# Patient Record
Sex: Male | Born: 1970 | Race: Black or African American | Hispanic: No | Marital: Single | Smoking: Never smoker
Health system: Southern US, Community
[De-identification: ages and names within clinical notes are randomized; demographics above are authoritative.]

## PROBLEM LIST (undated history)

## (undated) DIAGNOSIS — J189 Pneumonia, unspecified organism: Secondary | ICD-10-CM

## (undated) DIAGNOSIS — N189 Chronic kidney disease, unspecified: Secondary | ICD-10-CM

## (undated) DIAGNOSIS — I10 Essential (primary) hypertension: Secondary | ICD-10-CM

## (undated) DIAGNOSIS — I509 Heart failure, unspecified: Secondary | ICD-10-CM

## (undated) DIAGNOSIS — E119 Type 2 diabetes mellitus without complications: Secondary | ICD-10-CM

## (undated) DIAGNOSIS — E785 Hyperlipidemia, unspecified: Secondary | ICD-10-CM

## (undated) DIAGNOSIS — R0600 Dyspnea, unspecified: Secondary | ICD-10-CM

## (undated) DIAGNOSIS — T7840XA Allergy, unspecified, initial encounter: Secondary | ICD-10-CM

## (undated) DIAGNOSIS — D649 Anemia, unspecified: Secondary | ICD-10-CM

## (undated) DIAGNOSIS — H5440 Blindness, one eye, unspecified eye: Secondary | ICD-10-CM

## (undated) HISTORY — DX: Blindness, one eye, unspecified eye: H54.40

## (undated) HISTORY — DX: Allergy, unspecified, initial encounter: T78.40XA

## (undated) HISTORY — PX: FRACTURE SURGERY: SHX138

## (undated) HISTORY — PX: CATARACT EXTRACTION: SUR2

## (undated) HISTORY — PX: MENISCUS REPAIR: SHX5179

## (undated) HISTORY — DX: Chronic kidney disease, unspecified: N18.9

## (undated) HISTORY — DX: Hyperlipidemia, unspecified: E78.5

## (undated) HISTORY — PX: EYE SURGERY: SHX253

---

## 2019-06-19 ENCOUNTER — Emergency Department (HOSPITAL_COMMUNITY): Payer: PRIVATE HEALTH INSURANCE

## 2019-06-19 ENCOUNTER — Other Ambulatory Visit: Payer: Self-pay

## 2019-06-19 ENCOUNTER — Inpatient Hospital Stay (HOSPITAL_COMMUNITY): Payer: PRIVATE HEALTH INSURANCE

## 2019-06-19 ENCOUNTER — Inpatient Hospital Stay (HOSPITAL_COMMUNITY)
Admission: EM | Admit: 2019-06-19 | Discharge: 2019-06-21 | DRG: 291 | Disposition: A | Discharge status: Home / Self Care | Payer: PRIVATE HEALTH INSURANCE | Attending: Internal Medicine | Admitting: Internal Medicine

## 2019-06-19 ENCOUNTER — Encounter (HOSPITAL_COMMUNITY): Payer: Self-pay | Admitting: Emergency Medicine

## 2019-06-19 DIAGNOSIS — I5031 Acute diastolic (congestive) heart failure: Secondary | ICD-10-CM

## 2019-06-19 DIAGNOSIS — Z8249 Family history of ischemic heart disease and other diseases of the circulatory system: Secondary | ICD-10-CM

## 2019-06-19 DIAGNOSIS — Z7984 Long term (current) use of oral hypoglycemic drugs: Secondary | ICD-10-CM

## 2019-06-19 DIAGNOSIS — I5033 Acute on chronic diastolic (congestive) heart failure: Secondary | ICD-10-CM | POA: Diagnosis present

## 2019-06-19 DIAGNOSIS — I509 Heart failure, unspecified: Secondary | ICD-10-CM

## 2019-06-19 DIAGNOSIS — E876 Hypokalemia: Secondary | ICD-10-CM | POA: Diagnosis not present

## 2019-06-19 DIAGNOSIS — E785 Hyperlipidemia, unspecified: Secondary | ICD-10-CM | POA: Diagnosis present

## 2019-06-19 DIAGNOSIS — E1165 Type 2 diabetes mellitus with hyperglycemia: Secondary | ICD-10-CM | POA: Diagnosis present

## 2019-06-19 DIAGNOSIS — M170 Bilateral primary osteoarthritis of knee: Secondary | ICD-10-CM | POA: Diagnosis present

## 2019-06-19 DIAGNOSIS — Z20822 Contact with and (suspected) exposure to covid-19: Secondary | ICD-10-CM | POA: Diagnosis present

## 2019-06-19 DIAGNOSIS — R778 Other specified abnormalities of plasma proteins: Secondary | ICD-10-CM | POA: Diagnosis present

## 2019-06-19 DIAGNOSIS — I10 Essential (primary) hypertension: Secondary | ICD-10-CM | POA: Diagnosis present

## 2019-06-19 DIAGNOSIS — R7989 Other specified abnormal findings of blood chemistry: Secondary | ICD-10-CM | POA: Diagnosis present

## 2019-06-19 DIAGNOSIS — E8809 Other disorders of plasma-protein metabolism, not elsewhere classified: Secondary | ICD-10-CM | POA: Diagnosis present

## 2019-06-19 DIAGNOSIS — I130 Hypertensive heart and chronic kidney disease with heart failure and stage 1 through stage 4 chronic kidney disease, or unspecified chronic kidney disease: Principal | ICD-10-CM | POA: Diagnosis present

## 2019-06-19 DIAGNOSIS — N1831 Chronic kidney disease, stage 3a: Secondary | ICD-10-CM | POA: Diagnosis present

## 2019-06-19 DIAGNOSIS — N179 Acute kidney failure, unspecified: Secondary | ICD-10-CM

## 2019-06-19 DIAGNOSIS — I5032 Chronic diastolic (congestive) heart failure: Secondary | ICD-10-CM

## 2019-06-19 DIAGNOSIS — I160 Hypertensive urgency: Secondary | ICD-10-CM

## 2019-06-19 DIAGNOSIS — E1122 Type 2 diabetes mellitus with diabetic chronic kidney disease: Secondary | ICD-10-CM | POA: Diagnosis present

## 2019-06-19 HISTORY — DX: Type 2 diabetes mellitus without complications: E11.9

## 2019-06-19 LAB — URINALYSIS, ROUTINE W REFLEX MICROSCOPIC
Bacteria, UA: NONE SEEN
Bilirubin Urine: NEGATIVE
Glucose, UA: NEGATIVE mg/dL
Ketones, ur: NEGATIVE mg/dL
Leukocytes,Ua: NEGATIVE
Nitrite: NEGATIVE
Protein, ur: 30 mg/dL — AB
Specific Gravity, Urine: 1.004 — ABNORMAL LOW (ref 1.005–1.030)
pH: 7 (ref 5.0–8.0)

## 2019-06-19 LAB — LIPID PANEL
Cholesterol: 207 mg/dL — ABNORMAL HIGH (ref 0–200)
HDL: 38 mg/dL — ABNORMAL LOW (ref >40)
LDL Cholesterol: 154 mg/dL — ABNORMAL HIGH (ref 0–99)
Total CHOL/HDL Ratio: 5.4 RATIO
Triglycerides: 76 mg/dL (ref <150)
VLDL: 15 mg/dL (ref 0–40)

## 2019-06-19 LAB — BASIC METABOLIC PANEL
Anion gap: 6 (ref 5–15)
BUN: 10 mg/dL (ref 6–20)
CO2: 26 mmol/L (ref 22–32)
Calcium: 8.3 mg/dL — ABNORMAL LOW (ref 8.9–10.3)
Chloride: 107 mmol/L (ref 98–111)
Creatinine, Ser: 1.76 mg/dL — ABNORMAL HIGH (ref 0.61–1.24)
GFR calc Af Amer: 52 mL/min — ABNORMAL LOW (ref >60)
GFR calc non Af Amer: 45 mL/min — ABNORMAL LOW (ref >60)
Glucose, Bld: 164 mg/dL — ABNORMAL HIGH (ref 70–99)
Potassium: 4 mmol/L (ref 3.5–5.1)
Sodium: 139 mmol/L (ref 135–145)

## 2019-06-19 LAB — CBC
HCT: 34.1 % — ABNORMAL LOW (ref 39.0–52.0)
Hemoglobin: 11.4 g/dL — ABNORMAL LOW (ref 13.0–17.0)
MCH: 26.8 pg (ref 26.0–34.0)
MCHC: 33.4 g/dL (ref 30.0–36.0)
MCV: 80 fL (ref 80.0–100.0)
Platelets: 277 10*3/uL (ref 150–400)
RBC: 4.26 MIL/uL (ref 4.22–5.81)
RDW: 14.1 % (ref 11.5–15.5)
WBC: 4.8 10*3/uL (ref 4.0–10.5)
nRBC: 0 % (ref 0.0–0.2)

## 2019-06-19 LAB — TROPONIN I (HIGH SENSITIVITY)
Troponin I (High Sensitivity): 38 ng/L — ABNORMAL HIGH (ref <18)
Troponin I (High Sensitivity): 39 ng/L — ABNORMAL HIGH (ref <18)
Troponin I (High Sensitivity): 36 ng/L — ABNORMAL HIGH (ref <18)

## 2019-06-19 LAB — HEPATIC FUNCTION PANEL
ALT: 18 U/L (ref 0–44)
AST: 18 U/L (ref 15–41)
Albumin: 2.6 g/dL — ABNORMAL LOW (ref 3.5–5.0)
Alkaline Phosphatase: 59 U/L (ref 38–126)
Bilirubin, Direct: 0.1 mg/dL (ref 0.0–0.2)
Indirect Bilirubin: 0.9 mg/dL (ref 0.3–0.9)
Total Bilirubin: 1 mg/dL (ref 0.3–1.2)
Total Protein: 6.5 g/dL (ref 6.5–8.1)

## 2019-06-19 LAB — HIV ANTIBODY (ROUTINE TESTING W REFLEX): HIV Screen 4th Generation wRfx: NONREACTIVE

## 2019-06-19 LAB — GLUCOSE, CAPILLARY: Glucose-Capillary: 161 mg/dL — ABNORMAL HIGH (ref 70–99)

## 2019-06-19 LAB — ECHOCARDIOGRAM COMPLETE

## 2019-06-19 LAB — SARS CORONAVIRUS 2 BY RT PCR (HOSPITAL ORDER, PERFORMED IN ~~LOC~~ HOSPITAL LAB): SARS Coronavirus 2: NEGATIVE

## 2019-06-19 LAB — BRAIN NATRIURETIC PEPTIDE: B Natriuretic Peptide: 367.3 pg/mL — ABNORMAL HIGH (ref 0.0–100.0)

## 2019-06-19 LAB — CBG MONITORING, ED: Glucose-Capillary: 120 mg/dL — ABNORMAL HIGH (ref 70–99)

## 2019-06-19 LAB — TSH: TSH: 0.947 u[IU]/mL (ref 0.350–4.500)

## 2019-06-19 LAB — HEMOGLOBIN A1C
Hgb A1c MFr Bld: 9.9 % — ABNORMAL HIGH (ref 4.8–5.6)
Mean Plasma Glucose: 237.43 mg/dL

## 2019-06-19 MED ORDER — FUROSEMIDE 10 MG/ML IJ SOLN
20.0000 mg | INTRAMUSCULAR | Status: AC
  Administered 2019-06-19: 20 mg via INTRAVENOUS
  Filled 2019-06-19: qty 2

## 2019-06-19 MED ORDER — HYDRALAZINE HCL 10 MG PO TABS
10.0000 mg | ORAL_TABLET | Freq: Three times a day (TID) | ORAL | Status: DC
  Administered 2019-06-19 – 2019-06-21 (×6): 10 mg via ORAL
  Filled 2019-06-19 (×8): qty 1

## 2019-06-19 MED ORDER — ONDANSETRON HCL 4 MG/2ML IJ SOLN
4.0000 mg | Freq: Four times a day (QID) | INTRAMUSCULAR | Status: DC | PRN
  Administered 2019-06-19: 4 mg via INTRAVENOUS
  Filled 2019-06-19: qty 2

## 2019-06-19 MED ORDER — ACETAMINOPHEN 325 MG PO TABS
650.0000 mg | ORAL_TABLET | Freq: Four times a day (QID) | ORAL | Status: DC | PRN
  Administered 2019-06-19 – 2019-06-20 (×2): 650 mg via ORAL
  Filled 2019-06-19 (×2): qty 2

## 2019-06-19 MED ORDER — HEPARIN SODIUM (PORCINE) 5000 UNIT/ML IJ SOLN
5000.0000 [IU] | Freq: Three times a day (TID) | INTRAMUSCULAR | Status: DC
  Administered 2019-06-19 – 2019-06-21 (×4): 5000 [IU] via SUBCUTANEOUS
  Filled 2019-06-19 (×6): qty 1

## 2019-06-19 MED ORDER — FUROSEMIDE 10 MG/ML IJ SOLN
40.0000 mg | Freq: Every day | INTRAMUSCULAR | Status: DC
  Administered 2019-06-20 – 2019-06-21 (×2): 40 mg via INTRAVENOUS
  Filled 2019-06-19 (×2): qty 4

## 2019-06-19 MED ORDER — SODIUM CHLORIDE 0.9% FLUSH: 3.0000 mL | Freq: Once | INTRAVENOUS | Status: DC

## 2019-06-19 MED ORDER — ISOSORBIDE MONONITRATE ER 30 MG PO TB24
15.0000 mg | ORAL_TABLET | Freq: Every day | ORAL | Status: DC
  Administered 2019-06-19 – 2019-06-21 (×3): 15 mg via ORAL
  Filled 2019-06-19 (×3): qty 1

## 2019-06-19 MED ORDER — ASPIRIN EC 81 MG PO TBEC
81.0000 mg | DELAYED_RELEASE_TABLET | Freq: Every day | ORAL | Status: DC
  Administered 2019-06-20 – 2019-06-21 (×2): 81 mg via ORAL
  Filled 2019-06-19 (×3): qty 1

## 2019-06-19 MED ORDER — CLONIDINE HCL 0.2 MG PO TABS
0.2000 mg | ORAL_TABLET | Freq: Once | ORAL | Status: AC
  Administered 2019-06-19: 0.2 mg via ORAL
  Filled 2019-06-19: qty 1

## 2019-06-19 MED ORDER — FUROSEMIDE 10 MG/ML IJ SOLN: 40.0000 mg | Freq: Every day | INTRAMUSCULAR | Status: DC

## 2019-06-19 MED ORDER — PROCHLORPERAZINE EDISYLATE 10 MG/2ML IJ SOLN
10.0000 mg | Freq: Once | INTRAMUSCULAR | Status: AC
  Administered 2019-06-19: 10 mg via INTRAVENOUS
  Filled 2019-06-19: qty 2

## 2019-06-19 MED ORDER — ATORVASTATIN CALCIUM 40 MG PO TABS
40.0000 mg | ORAL_TABLET | Freq: Every day | ORAL | Status: DC
  Administered 2019-06-19 – 2019-06-21 (×3): 40 mg via ORAL
  Filled 2019-06-19 (×3): qty 1

## 2019-06-19 MED ORDER — INSULIN ASPART 100 UNIT/ML ~~LOC~~ SOLN
0.0000 [IU] | Freq: Three times a day (TID) | SUBCUTANEOUS | Status: DC
  Administered 2019-06-20 (×2): 3 [IU] via SUBCUTANEOUS
  Administered 2019-06-20: 2 [IU] via SUBCUTANEOUS
  Administered 2019-06-21: 3 [IU] via SUBCUTANEOUS

## 2019-06-19 MED ORDER — INSULIN ASPART 100 UNIT/ML ~~LOC~~ SOLN: 0.0000 [IU] | Freq: Every day | SUBCUTANEOUS | Status: DC

## 2019-06-19 NOTE — Progress Notes (Signed)
   06/19/19 1950  Assess: MEWS Score  Temp 99.1 F (37.3 C)  BP (!) 202/117  Pulse Rate 77  Resp 14  SpO2 94 %  O2 Device Room Air  Assess: MEWS Score  MEWS Temp 0  MEWS Systolic 2  MEWS Pulse 0  MEWS RR 0  MEWS LOC 0  MEWS Score 2  MEWS Score Color Yellow  Assess: if the MEWS score is Yellow or Red  Were vital signs taken at a resting state? Yes  Focused Assessment Documented focused assessment  Early Detection of Sepsis Score *See Row Information* Low  MEWS guidelines implemented *See Row Information* Yes  Treat  MEWS Interventions Other (Comment) (notified APP)  Take Vital Signs  Increase Vital Sign Frequency  Yellow: Q 2hr X 2 then Q 4hr X 2, if remains yellow, continue Q 4hrs  Escalate  MEWS: Escalate Yellow: discuss with charge nurse/RN and consider discussing with provider and RRT  Notify: Charge Nurse/RN  Name of Charge Nurse/RN Notified Lars Mage, RN  Date Charge Nurse/RN Notified 06/19/19  Time Charge Nurse/RN Notified 1954  Notify: Provider  Provider Name/Title Kennon Holter, AOO  Date Provider Notified 06/19/19  Time Provider Notified 1954  Notification Type Page  Notification Reason Change in status  Response See new orders  Date of Provider Response 06/19/19  Time of Provider Response 2006  Notify: Rapid Response  Name of Rapid Response RN Notified  (n/a)  Document  Patient Outcome Stabilized after interventions  Progress note created (see row info) Yes   MEWS yellow due to elevated BP.  Yellow protocol initiated.  APP notified.  Requested manual BP and placed order for clonidine. Pt repeat BP 166/89.

## 2019-06-19 NOTE — Progress Notes (Signed)
  Echocardiogram 2D Echocardiogram has been performed.  Merrie Roof F 06/19/2019, 2:33 PM

## 2019-06-19 NOTE — ED Provider Notes (Signed)
[facility] EMERGENCY DEPARTMENT Provider Note   CSN: [id number] Arrival date & time: 06/19/19  7026     History Chief Complaint  Patient presents with  . Shortness of Breath    Timothy [name] is a 49 y.o. male.  49 year old male with past medical history including T2DM who p/w as of breath and leg swelling.  Patient reports a 3-day history of progressively worsening shortness of breath which he notes is present at night when he tries to sleep as well as when he exerts himself.  He notes that he has not been able to lay flat at night and has to sleep sitting up due to the severity of his shortness of breath.  He has also noticed shortness of breath with exertion with associated chest tightness.  He has a mild dry cough but no other URI symptoms.  He is also noticed lower extremity edema and increased abdominal girth.  He denies any history of heart failure or other cardiac problems.  Family history notable for mother with heart problems.  He denies tobacco, alcohol, or drug use. No recent long travel, h/o blood clots, or h/o cancer.  The history is provided by the patient.  Shortness of Breath      Past Medical History:  Diagnosis Date  . Diabetes mellitus without complication ([place])     There are no problems to display for this patient.   History reviewed. No pertinent surgical history.     No family history on file.  Social History   Tobacco Use  . Smoking status: Not on file  Substance Use Topics  . Alcohol use: Not on file  . Drug use: Not on file    Home Medications Prior to Admission medications   Not on File    Allergies    Patient has no known allergies.  Review of Systems   Review of Systems  Respiratory: Positive for shortness of breath.    All other systems reviewed and are negative except that which was mentioned in HPI  Physical Exam Updated Vital Signs BP (!) 204/119   Pulse 72   Temp 98.4 F (36.9 C) (Oral)    Resp 17   SpO2 95%   Physical Exam Vitals and nursing note reviewed.  Constitutional:      General: He is not in acute distress.    Appearance: He is well-developed.  HENT:     Head: Normocephalic and atraumatic.  Eyes:     Conjunctiva/sclera: Conjunctivae normal.  Cardiovascular:     Rate and Rhythm: Normal rate and regular rhythm.     Heart sounds: Murmur present.  Pulmonary:     Comments: Mild tachypnea without respiratory distress; crackles b/l in all lung fields Abdominal:     General: Bowel sounds are normal. There is no distension.     Palpations: Abdomen is soft.     Tenderness: There is no abdominal tenderness.  Musculoskeletal:     Cervical back: Neck supple.     Right lower leg: Edema present.     Left lower leg: Edema present.     Comments: 2+ pitting edema BLE  Skin:    General: Skin is warm and dry.  Neurological:     Mental Status: He is alert and oriented to person, place, and time.     Comments: Fluent speech  Psychiatric:        Judgment: Judgment normal.     ED Results / Procedures / Treatments  Labs (all labs ordered are listed, but only abnormal results are displayed) Labs Reviewed  BASIC METABOLIC PANEL - Abnormal; Notable for the following components:      Result Value   Glucose, Bld 164 (*)    Creatinine, Ser 1.76 (*)    Calcium 8.3 (*)    GFR calc non Af Amer 45 (*)    GFR calc Af Amer 52 (*)    All other components within normal limits  CBC - Abnormal; Notable for the following components:   Hemoglobin 11.4 (*)    HCT 34.1 (*)    All other components within normal limits  BRAIN NATRIURETIC PEPTIDE - Abnormal; Notable for the following components:   B Natriuretic Peptide 367.3 (*)    All other components within normal limits  HEPATIC FUNCTION PANEL - Abnormal; Notable for the following components:   Albumin 2.6 (*)    All other components within normal limits  URINALYSIS, ROUTINE W REFLEX MICROSCOPIC - Abnormal; Notable for the  following components:   Color, Urine COLORLESS (*)    Specific Gravity, Urine 1.004 (*)    Hgb urine dipstick SMALL (*)    Protein, ur 30 (*)    All other components within normal limits  HEMOGLOBIN A1C - Abnormal; Notable for the following components:   Hgb A1c MFr Bld 9.9 (*)    All other components within normal limits  LIPID PANEL - Abnormal; Notable for the following components:   Cholesterol 207 (*)    HDL 38 (*)    LDL Cholesterol 154 (*)    All other components within normal limits  TROPONIN I (HIGH SENSITIVITY) - Abnormal; Notable for the following components:   Troponin I (High Sensitivity) 38 (*)    All other components within normal limits  TROPONIN I (HIGH SENSITIVITY) - Abnormal; Notable for the following components:   Troponin I (High Sensitivity) 39 (*)    All other components within normal limits  SARS CORONAVIRUS 2 BY RT PCR (HOSPITAL ORDER, [place] [facility])  TSH  HIV ANTIBODY (ROUTINE TESTING W REFLEX)    EKG EKG Interpretation  Date/Time:  Saturday Jun 19 2019 09:08:52 EDT Ventricular Rate:  92 PR Interval:  188 QRS Duration: 88 QT Interval:  410 QTC Calculation: 507 R Axis:   103 Text Interpretation: Sinus rhythm with occasional Premature ventricular complexes Rightward axis Septal infarct , age undetermined Prolonged QT Abnormal ECG No previous ECGs available Confirmed by Theotis Burrow [phone]) on 06/19/2019 9:51:12 AM   Radiology DG Chest 2 View  Result Date: 06/19/2019 CLINICAL DATA:  Shortness of breath for 2 days with dry cough. EXAM: CHEST - 2 VIEW COMPARISON:  None. FINDINGS: Heart size is within normal limits. There is patchy bilateral perihilar and bibasilar airspace opacities. Upper lungs are relatively clear. No pleural effusion or pneumothorax is seen. Osseous structures about the chest are unremarkable. IMPRESSION: Patchy bilateral perihilar and bibasilar airspace opacities. Differential includes multifocal pneumonia,  asymmetric edema related to volume overload, hypersensitivity pneumonitis, and respiratory bronchiolitis. Electronically Signed   By: Franki Cabot M.D.   On: 06/19/2019 11:02   ECHOCARDIOGRAM COMPLETE  Result Date: 06/19/2019    ECHOCARDIOGRAM REPORT   Patient Name:   Timothy [name] Date of Exam: 06/19/2019 Medical Rec #:  [id number]             Height:       66.0 in Accession #:    [id number]  Weight:       180.0 lb Date of Birth:  05/15/1970             BSA:          1.913 m Patient Age:    16 years              BP:           188/108 mmHg Patient Gender: M                     HR:           71 bpm. Exam Location:  Inpatient Procedure: 2D Echo, Cardiac Doppler and Color Doppler Indications:    H37.16 Acute diastolic (congestive) heart failure  History:        Patient has no prior history of Echocardiogram examinations.                 Risk Factors:Hypertension.  Sonographer:    Merrie Roof RDCS Referring Phys: [id number] [place]  1. Left ventricular ejection fraction, by estimation, is 60 to 65%. The left ventricle has normal function. The left ventricle has no regional wall motion abnormalities. There is severe left ventricular hypertrophy. Elevated EDP by E/E' velocity.  2. Right ventricular systolic function is normal. The right ventricular size is normal.  3. The mitral valve is normal in structure. Mild mitral valve regurgitation. No evidence of mitral stenosis.  4. The aortic valve is tricuspid. Aortic valve regurgitation is mild. No aortic stenosis is present.  5. The inferior vena cava is normal in size with greater than 50% respiratory variability, suggesting right atrial pressure of 3 mmHg. FINDINGS  Left Ventricle: Left ventricular ejection fraction, by estimation, is 60 to 65%. The left ventricle has normal function. The left ventricle has no regional wall motion abnormalities. The left ventricular internal cavity size was normal in size. There is  severe left  ventricular hypertrophy. Elevated EDP by E/E' velocity. Right Ventricle: The right ventricular size is normal. No increase in right ventricular wall thickness. Right ventricular systolic function is normal. Left Atrium: Left atrial size was normal in size. Right Atrium: Right atrial size was normal in size. Pericardium: Trivial pericardial effusion is present. The pericardial effusion is posterior to the left ventricle and localized near the right atrium. Mitral Valve: The mitral valve is normal in structure. Normal mobility of the mitral valve leaflets. Mild mitral valve regurgitation. No evidence of mitral valve stenosis. Tricuspid Valve: The tricuspid valve is normal in structure. Tricuspid valve regurgitation is mild . No evidence of tricuspid stenosis. Aortic Valve: The aortic valve is tricuspid. Aortic valve regurgitation is mild. No aortic stenosis is present. Pulmonic Valve: The pulmonic valve was normal in structure. Pulmonic valve regurgitation is mild. No evidence of pulmonic stenosis. Aorta: The aortic root is normal in size and structure. Venous: The inferior vena cava is normal in size with greater than 50% respiratory variability, suggesting right atrial pressure of 3 mmHg. IAS/Shunts: No atrial level shunt detected by color flow Doppler.   LV Volumes (MOD) LV vol d, MOD A4C: 90.4 ml Diastology LV vol s, MOD A4C: 36.5 ml LV e' lateral:   7.07 cm/s LV SV MOD A4C:     90.4 ml LV E/e' lateral: 13.5                            LV e' medial:    6.08 cm/s  LV E/e' medial:  15.6  RIGHT VENTRICLE             IVC RV Basal diam:  3.80 cm     IVC diam: 1.80 cm RV S prime:     18.00 cm/s TAPSE (M-mode): 2.7 cm LEFT ATRIUM            Index LA Vol (A4C): 120.0 ml 62.74 ml/m  AORTIC VALVE LVOT Vmax:   89.40 cm/s LVOT Vmean:  60.100 cm/s LVOT VTI:    0.181 m  AORTA Ao Asc diam: 3.30 cm MITRAL VALVE MV Area (PHT): 4.49 cm    SHUNTS MV Decel Time: 169 msec    Systemic VTI: 0.18 m MV E  velocity: 95.10 cm/s MV A velocity: 42.00 cm/s MV E/A ratio:  2.26 Jenkins Rouge MD Electronically signed by Jenkins Rouge MD Signature Date/Time: 06/19/2019/3:29:11 PM    Final     Procedures Procedures (including critical care time)  Medications Ordered in ED Medications  sodium chloride flush (NS) 0.9 % injection 3 mL (has no administration in time range)  furosemide (LASIX) injection 20 mg (20 mg Intravenous Given 06/19/19 1037)    ED Course  I have reviewed the triage vital signs and the nursing notes.  Pertinent labs & imaging results that were available during my care of the patient were reviewed by me and considered in my medical decision making (see chart for details).    MDM Rules/Calculators/A&P                      Pt hypertensive on exam, O2 sat stable on RA.  He had mild tachypnea and crackles bilaterally as well as pitting edema of lower extremities.  Concern for heart failure based on symptoms.  Differential includes renal failure or liver disease although less likely given acute nature of sx. Labs show Cr 1.76, Hgb 11.4, BNP 367, trop 38 likely 2/2 kidney injury. CXR showing b/l infiltrates, suspect CHF w/ pulm edema based on hx and exam. Gave IV lasix. Discussed admission w/ [facility], Dr. Vallery Ridge Final Clinical Impression(s) / ED Diagnoses Final diagnoses:  Congestive heart failure, unspecified HF chronicity, unspecified heart failure type ([place])  AKI (acute kidney injury) [facility])    Rx / DC Orders ED Discharge Orders    None       Matthews Franks, Wenda Overland, MD 06/19/19 1542

## 2019-06-19 NOTE — ED Triage Notes (Signed)
Pt reports sob and leg swelling x2 days, denies cardiac history. resp labored.

## 2019-06-19 NOTE — H&P (Addendum)
History and Physical    Timothy [name] HYQ:[id number] DOB: 1970-10-12 DOA: 06/19/2019  PCP: Patient, No Pcp Per Raelyn Number N Patient coming from: Home.  I have personally briefly reviewed patient's old medical records in [place]  Chief Complaint: Shortness of breath for 3 days.  HPI: Timothy [name] is a 49 y.o. male with HTN, DM type [name], evisceration left eye post trauma, bilateral OA of knees presented to the ED with above.   He reports 1 week of progressively worsening dyspnea with wheezing and dry cough.  Also reports 3 pillow orthopnea/PND. For the past 2 days he has developed bilateral lower extremity swelling which got him concerned and prompted him to come to the ED.  He denies any chest pain, palpitations. No fever chills or cough. No blood in the stools.  ED Course:  In the ED vital signs noted for blood pressures 199/116-204/119, pulse 73, RR 16 SPO2 98% room air. Labs with WBC 4.8, BUN 10, creatinine 1.7, albumin 2.6. hs troponin 38, BNP 367.3  CXR with CHF vs multifocal PNA.  EKG with NSR at 70 bpm, right axis deviation.  LVH with ST-T wave changes.  Question repol abnormalities.  QTc 450 ms.  He received 20 mg of IV Lasix with cumulative urine output of 600 cc.   Personal social history: Originally from [place] but now lives here with girlfriend.  Has 1 daughter that resides in [place].  Denies any history of smoking EtOH use or any drugs. Loves sports including Basketball.   Family history: Significant for mother dying of CAD/MI in 75 at age 20. Father passed away of unknown cause in his 37s.  Review of Systems: As per HPI otherwise 10 point review of systems negative.  Review of Systems  Constitutional: Positive for malaise/fatigue. Negative for chills, diaphoresis, fever and weight loss.  HENT: Negative.   Eyes: Negative for blurred vision, double vision, photophobia, pain, discharge and redness.  Respiratory: Positive for cough,  shortness of breath and wheezing. Negative for hemoptysis and sputum production.   Cardiovascular: Positive for orthopnea, leg swelling and PND. Negative for chest pain, palpitations and claudication.  Gastrointestinal: Negative for abdominal pain, blood in stool, constipation, diarrhea, heartburn, melena, nausea and vomiting.  Genitourinary: Negative.   Musculoskeletal: Negative.   Skin: Negative.   Neurological: Negative.   Endo/Heme/Allergies: Negative.   Psychiatric/Behavioral: Negative.     Past Medical History:  Diagnosis Date  . Diabetes mellitus without complication ([place])     History reviewed. No pertinent surgical history.   has no history on file for tobacco, alcohol, and drug.  No Known Allergies  No family history on file.  Unacceptable: Noncontributory, unremarkable, or negative. Acceptable: (example)Family history negative for heart disease  Prior to Admission medications   Not on File    Physical Exam: Vitals:   06/19/19 0954 06/19/19 1000 06/19/19 1054 06/19/19 1055  BP: (!) 199/116 (!) 196/115 (!) 204/119 (!) 204/119  Pulse: 73 72 72 72  Resp: 16  13 17   Temp: 98 F (36.7 C)  98.4 F (36.9 C)   TempSrc: Oral  Oral   SpO2: 98% 98% 96% 95%    Constitutional: NAD, calm, comfortable Vitals:   06/19/19 0954 06/19/19 1000 06/19/19 1054 06/19/19 1055  BP: (!) 199/116 (!) 196/115 (!) 204/119 (!) 204/119  Pulse: 73 72 72 72  Resp: 16  13 17   Temp: 98 F (36.7 C)  98.4 F (36.9 C)   TempSrc: Oral  Oral   SpO2: 98% 98% 96% 95%    Physical Exam  Constitutional: He is oriented to person, place, and time. He appears well-developed and well-nourished. No distress.  HENT:  Head: Normocephalic and atraumatic.  Right Ear: External ear normal.  Left Ear: External ear normal.  Eyes: Pupils are equal, round, and reactive to light. Right eye exhibits no discharge.  Status post evisceration of left eye.  Neck: No JVD present.  No carotid bruits.    Cardiovascular: Normal rate and regular rhythm. Exam reveals no gallop and no friction rub.  No murmur heard. Pulmonary/Chest: No respiratory distress. He has wheezes. He has rales.  Abdominal: Bowel sounds are normal. He exhibits no distension and no mass. There is no abdominal tenderness. There is no rebound and no guarding.  Musculoskeletal:        General: Edema present. No tenderness or deformity.     Cervical back: Normal range of motion.  Neurological: He is alert and oriented to person, place, and time. He displays normal reflexes. He exhibits normal muscle tone.  Skin: Skin is warm and dry. No rash noted. He is not diaphoretic. No erythema.  Psychiatric: He has a normal mood and affect. His behavior is normal. Judgment and thought content normal.     Labs on Admission: I have personally reviewed following labs and imaging studies  CBC: Recent Labs  Lab 06/19/19 0920  WBC 4.8  HGB 11.4*  HCT 34.1*  MCV 80.0  PLT 825   Basic Metabolic Panel: Recent Labs  Lab 06/19/19 0920  NA 139  K 4.0  CL 107  CO2 26  GLUCOSE 164*  BUN 10  CREATININE 1.76*  CALCIUM 8.3*   GFR: CrCl cannot be calculated (Unknown ideal weight.). Liver Function Tests: Recent Labs  Lab 06/19/19 0920  AST 18  ALT 18  ALKPHOS 59  BILITOT 1.0  PROT 6.5  ALBUMIN 2.6*   No results for input(s): LIPASE, AMYLASE in the last 168 hours. No results for input(s): AMMONIA in the last 168 hours. Coagulation Profile: No results for input(s): INR, PROTIME in the last 168 hours. Cardiac Enzymes: No results for input(s): CKTOTAL, CKMB, CKMBINDEX, TROPONINI in the last 168 hours. BNP (last 3 results) No results for input(s): PROBNP in the last 8760 hours. HbA1C: No results for input(s): HGBA1C in the last 72 hours. CBG: No results for input(s): GLUCAP in the last 168 hours. Lipid Profile: No results for input(s): CHOL, HDL, LDLCALC, TRIG, CHOLHDL, LDLDIRECT in the last 72 hours. Thyroid Function  Tests: Recent Labs    06/19/19 1450  TSH 0.947   Anemia Panel: No results for input(s): VITAMINB12, FOLATE, FERRITIN, TIBC, IRON, RETICCTPCT in the last 72 hours. Urine analysis: No results found for: COLORURINE, APPEARANCEUR, [place], [place], GLUCOSEU, HGBUR, BILIRUBINUR, KETONESUR, PROTEINUR, UROBILINOGEN, NITRITE, LEUKOCYTESUR  Radiological Exams on Admission: DG Chest 2 View  Result Date: 06/19/2019 CLINICAL DATA:  Shortness of breath for 2 days with dry cough. EXAM: CHEST - 2 VIEW COMPARISON:  None. FINDINGS: Heart size is within normal limits. There is patchy bilateral perihilar and bibasilar airspace opacities. Upper lungs are relatively clear. No pleural effusion or pneumothorax is seen. Osseous structures about the chest are unremarkable. IMPRESSION: Patchy bilateral perihilar and bibasilar airspace opacities. Differential includes multifocal pneumonia, asymmetric edema related to volume overload, hypersensitivity pneumonitis, and respiratory bronchiolitis. Electronically Signed   By: Franki Cabot M.D.   On: 06/19/2019 11:02    EKG: Independently reviewed.  Normal sinus rhythm at 92  bpm, normal axis LVH with ST-T wave changes.  Question repull.  QTc 507 ms  Assessment/Plan Active Problems:   Elevated troponin   Acute CHF (congestive heart failure) (HCC)   Hypoalbuminemia   Hypertensive urgency   AKI (acute kidney injury) (H[facility])   CHF (congestive heart failure) (HCC)  #Acute CHF likely diastolic given history of uncontrolled hypertension -TTE. -We will start Lasix 20 mg daily and titrate to keep 1 to 2 L negative daily. -Patient counseled on fluid restriction to 48 ounces daily.  2 g sodium. -To start on beta-blockers once in compensated CHF. -Not a candidate for ACE/ARB due to presumed AKI. -Continue with aspirin. -Will need outpatient cardiology/PCP follow-up.  #HTN urgency  -Start on Imdur/hydralazine combination. -No HCTZ given patient would likely be on Lasix as  outpatient. -No ACE ARB currently due to presumed AKI. -No beta-blockers as decompensated CHF.  #DM type [name] -Poorly controlled as indicated by HbA1c of 9.9 percent -Holding Metformin secondary to AKI. -He will likely need insulin versus multiple oral hypoglycemic meds. -Endocrine consult.  Will also need diabetic teaching. -Lipitor given LDL of 154.  #AKI crt 1.7 unknown baseline.  -Mild proteinuria on UA.  We will continue to monitor on diuresis.  # Hypalbuminemia 2.4 -HIV nonreactive. UA pending.    DVT prophylaxis: Subcu heparin.    Code Status: Full code Family Communication: Spoke with patient.  Disposition Plan: Likely home.    Consults called:  Admission status: Inpatient   Jazmon Kos MD [facility]   If 7PM-7AM, please contact night-coverage [email or web address] Password [facility]  06/19/2019, 12:27 PM

## 2019-06-20 DIAGNOSIS — I10 Essential (primary) hypertension: Secondary | ICD-10-CM

## 2019-06-20 LAB — CBC WITH DIFFERENTIAL/PLATELET
Abs Immature Granulocytes: 0.02 10*3/uL (ref 0.00–0.07)
Basophils Absolute: 0 10*3/uL (ref 0.0–0.1)
Basophils Relative: 1 %
Eosinophils Absolute: 0.2 10*3/uL (ref 0.0–0.5)
Eosinophils Relative: 4 %
HCT: 32.1 % — ABNORMAL LOW (ref 39.0–52.0)
Hemoglobin: 10.8 g/dL — ABNORMAL LOW (ref 13.0–17.0)
Immature Granulocytes: 0 %
Lymphocytes Relative: 23 %
Lymphs Abs: 1.1 10*3/uL (ref 0.7–4.0)
MCH: 26.9 pg (ref 26.0–34.0)
MCHC: 33.6 g/dL (ref 30.0–36.0)
MCV: 80 fL (ref 80.0–100.0)
Monocytes Absolute: 0.4 10*3/uL (ref 0.1–1.0)
Monocytes Relative: 9 %
Neutro Abs: 3.2 10*3/uL (ref 1.7–7.7)
Neutrophils Relative %: 63 %
Platelets: 278 10*3/uL (ref 150–400)
RBC: 4.01 MIL/uL — ABNORMAL LOW (ref 4.22–5.81)
RDW: 13.8 % (ref 11.5–15.5)
WBC: 5.1 10*3/uL (ref 4.0–10.5)
nRBC: 0 % (ref 0.0–0.2)

## 2019-06-20 LAB — BASIC METABOLIC PANEL
Anion gap: 8 (ref 5–15)
BUN: 12 mg/dL (ref 6–20)
CO2: 28 mmol/L (ref 22–32)
Calcium: 8 mg/dL — ABNORMAL LOW (ref 8.9–10.3)
Chloride: 100 mmol/L (ref 98–111)
Creatinine, Ser: 2.01 mg/dL — ABNORMAL HIGH (ref 0.61–1.24)
GFR calc Af Amer: 44 mL/min — ABNORMAL LOW (ref >60)
GFR calc non Af Amer: 38 mL/min — ABNORMAL LOW (ref >60)
Glucose, Bld: 212 mg/dL — ABNORMAL HIGH (ref 70–99)
Potassium: 3.6 mmol/L (ref 3.5–5.1)
Sodium: 136 mmol/L (ref 135–145)

## 2019-06-20 LAB — TROPONIN I (HIGH SENSITIVITY): Troponin I (High Sensitivity): 36 ng/L — ABNORMAL HIGH (ref <18)

## 2019-06-20 LAB — GLUCOSE, CAPILLARY
Glucose-Capillary: 174 mg/dL — ABNORMAL HIGH (ref 70–99)
Glucose-Capillary: 149 mg/dL — ABNORMAL HIGH (ref 70–99)
Glucose-Capillary: 153 mg/dL — ABNORMAL HIGH (ref 70–99)
Glucose-Capillary: 126 mg/dL — ABNORMAL HIGH (ref 70–99)

## 2019-06-20 LAB — MAGNESIUM: Magnesium: 1.6 mg/dL — ABNORMAL LOW (ref 1.7–2.4)

## 2019-06-20 LAB — BRAIN NATRIURETIC PEPTIDE: B Natriuretic Peptide: 301.2 pg/mL — ABNORMAL HIGH (ref 0.0–100.0)

## 2019-06-20 NOTE — Progress Notes (Signed)
PROGRESS NOTE    Timothy [name]  ZOX:[id number] DOB: 10/24/1970 DOA: 06/19/2019 PCP: Patient, No Pcp Per    Brief Narrative:  49 year old gentleman with history of diabetes, intermittently taking Metformin for a long time, no regular medical follow-up presents to the emergency room with exertional dyspnea ongoing for more than a month, worse for the last 3 days.  Patient also complained of 3 pillow orthopnea and PND.  Also developed lower extremity swelling that prompted him to come to the ER. In the emergency room, initial blood pressure 199/116.  He is on room air.  Creatinine 1.7.  Troponins 38.  proBNP was 367.  Chest x-ray showed bilateral fluid.   Assessment & Plan:   Active Problems:   Elevated troponin   Acute CHF (congestive heart failure) (HCC)   Hypoalbuminemia   Hypertensive urgency   AKI (acute kidney injury) ([facility])   CHF (congestive heart failure) (HCC)   Accelerated hypertension  Acute diastolic congestive heart failure: Due to underlying untreated hypertension.  Clinically improving.  Echocardiogram with normal ejection fraction and severe left ventricular hypertrophy.  Will treat underlying hypertension aggressively. Continue Lasix 40 mg IV daily, will change to oral Lasix on discharge.  Hypertensive urgency: Blood pressures were uncontrolled.  Never treated for high blood pressures in the past.  Patient is started on hydralazine, nitrates.  Blood pressures already improving.  We will continue.  We will further uptitrate while he is in the hospital.  Hyperlipidemia: LDL more than 130.  Started on Lipitor.  We will continue.  Renal function abnormalities: Probably chronic kidney disease stage IIIa.  Minimal proteinuria.  Will need close monitoring outpatient.  Type 2 diabetes: Uncontrolled with hyperglycemia.  A1c 9.9.  On Metformin at home that is also he is taking intermittently.  Currently remains on sliding scale insulin.  Patient is not willing to go home on  insulin at this time.  Will monitor for renal function improvement.  Discharged home on oral hypoglycemics, will add sulfonylurea with Metformin.   DVT prophylaxis: Heparin subcu Code Status: Full code Family Communication: None Disposition Plan: Status is: Inpatient  Remains inpatient appropriate because:IV treatments appropriate due to intensity of illness or inability to take PO   Dispo: The patient is from: Home              Anticipated d/c is to: Home              Anticipated d/c date is: 2 days              Patient currently is not medically stable to d/c.  Patient admitted with significant symptoms including orthopnea due to diastolic dysfunction needing IV diuresis to improve his symptoms.  He will need to stay in the hospital for more than 2 midnights.          Consultants:   None  Procedures:   None  Antimicrobials:   None   Subjective: Patient was seen and examined.  History reviewed with him.  Patient states that he he has felt slightly better his breathing after admission to the hospital.  She was able to sleep well.  No other events.  Patient tells me that he was never diagnosed with hypertension in the past.  Objective: Vitals:   06/19/19 2142 06/20/19 0017 06/20/19 0350 06/20/19 0747  BP: (!) 166/89 (!) 149/91 (!) 141/88 138/80  Pulse: 79 69 65 70  Resp: 16 16 16 16   Temp: 98.4 F (36.9 C) 98.7 F (37.1  C) 97.8 F (36.6 C) 98.2 F (36.8 C)  TempSrc: Oral Oral Oral Oral  SpO2: 94% 95% 96% 97%  Weight:   84.4 kg   Height:        Intake/Output Summary (Last 24 hours) at 06/20/2019 1111 Last data filed at 06/20/2019 1032 Gross per 24 hour  Intake 662 ml  Output 675 ml  Net -13 ml   Filed Weights   06/19/19 1939 06/20/19 0350  Weight: 83.8 kg 84.4 kg    Examination:  General exam: Appears calm and comfortable , now on room air. Respiratory system: Clear to auscultation.  No added sounds. Cardiovascular system: S1 & S2 heard, RRR.  Trace  bilateral pedal edema.  JVD elevated. Gastrointestinal system: Abdomen is nondistended, soft and nontender.  Central nervous system: Alert and oriented. No focal neurological deficits. Extremities: Symmetric 5 x 5 power. Skin: No rashes, lesions or ulcers Psychiatry: Judgement and insight appear normal. Mood & affect appropriate.     Data Reviewed: I have personally reviewed following labs and imaging studies  CBC: Recent Labs  Lab 06/19/19 0920 06/20/19 1036  WBC 4.8 5.1  NEUTROABS  --  3.2  HGB 11.4* 10.8*  HCT 34.1* 32.1*  MCV 80.0 80.0  PLT 277 527   Basic Metabolic Panel: Recent Labs  Lab 06/19/19 0920  NA 139  K 4.0  CL 107  CO2 26  GLUCOSE 164*  BUN 10  CREATININE 1.76*  CALCIUM 8.3*   GFR: Estimated Creatinine Clearance: 52.3 mL/min (A) (by C-G formula based on SCr of 1.76 mg/dL (H)). Liver Function Tests: Recent Labs  Lab 06/19/19 0920  AST 18  ALT 18  ALKPHOS 59  BILITOT 1.0  PROT 6.5  ALBUMIN 2.6*   No results for input(s): LIPASE, AMYLASE in the last 168 hours. No results for input(s): AMMONIA in the last 168 hours. Coagulation Profile: No results for input(s): INR, PROTIME in the last 168 hours. Cardiac Enzymes: No results for input(s): CKTOTAL, CKMB, CKMBINDEX, TROPONINI in the last 168 hours. BNP (last 3 results) No results for input(s): PROBNP in the last 8760 hours. HbA1C: Recent Labs    06/19/19 1450  HGBA1C 9.9*   CBG: Recent Labs  Lab 06/19/19 1710 06/19/19 2142 06/20/19 0627  GLUCAP 120* 161* 174*   Lipid Profile: Recent Labs    06/19/19 1450  CHOL 207*  HDL 38*  LDLCALC 154*  TRIG 76  CHOLHDL 5.4   Thyroid Function Tests: Recent Labs    06/19/19 1450  TSH 0.947   Anemia Panel: No results for input(s): VITAMINB12, FOLATE, FERRITIN, TIBC, IRON, RETICCTPCT in the last 72 hours. Sepsis Labs: No results for input(s): PROCALCITON, LATICACIDVEN in the last 168 hours.  Recent Results (from the past 240 hour(s))    SARS Coronavirus 2 by RT PCR (hospital order, performed in [facility] hospital lab) Nasopharyngeal Nasopharyngeal Swab     Status: None   Collection Time: 06/19/19  1:24 PM   Specimen: Nasopharyngeal Swab  Result Value Ref Range Status   SARS Coronavirus 2 NEGATIVE NEGATIVE Final    Comment: (NOTE) SARS-CoV-2 target nucleic acids are NOT DETECTED. The SARS-CoV-2 RNA is generally detectable in upper and lower respiratory specimens during the acute phase of infection. The lowest concentration of SARS-CoV-2 viral copies this assay can detect is 250 copies / mL. A negative result does not preclude SARS-CoV-2 infection and should not be used as the sole basis for treatment or other patient management decisions.  A negative result may  occur with improper specimen collection / handling, submission of specimen other than nasopharyngeal swab, presence of viral mutation(s) within the areas targeted by this assay, and inadequate number of viral copies (<250 copies / mL). A negative result must be combined with clinical observations, patient history, and epidemiological information. Fact Sheet for Patients:   StrictlyIdeas.no Fact Sheet for Healthcare Providers: BankingDealers.co.za This test is not yet approved or cleared  by the [place] FDA and has been authorized for detection and/or diagnosis of SARS-CoV-2 by FDA under an Emergency Use Authorization (EUA).  This EUA will remain in effect (meaning this test can be used) for the duration of the COVID-19 declaration under Section 564(b)(1) of the Act, 21 U.S.C. section 360bbb-3(b)(1), unless the authorization is terminated or revoked sooner. Performed at [facility], [place] [address], [place], [place] [address]          Radiology Studies: DG Chest 2 View  Result Date: 06/19/2019 CLINICAL DATA:  Shortness of breath for 2 days with dry cough. EXAM: CHEST - 2 VIEW COMPARISON:   None. FINDINGS: Heart size is within normal limits. There is patchy bilateral perihilar and bibasilar airspace opacities. Upper lungs are relatively clear. No pleural effusion or pneumothorax is seen. Osseous structures about the chest are unremarkable. IMPRESSION: Patchy bilateral perihilar and bibasilar airspace opacities. Differential includes multifocal pneumonia, asymmetric edema related to volume overload, hypersensitivity pneumonitis, and respiratory bronchiolitis. Electronically Signed   By: Franki Cabot M.D.   On: 06/19/2019 11:02   ECHOCARDIOGRAM COMPLETE  Result Date: 06/19/2019    ECHOCARDIOGRAM REPORT   Patient Name:   Timothy [name] Date of Exam: 06/19/2019 Medical Rec #:  [id number]             Height:       66.0 in Accession #:    [id number]            Weight:       180.0 lb Date of Birth:  1970-03-06             BSA:          1.913 m Patient Age:    89 years              BP:           188/108 mmHg Patient Gender: M                     HR:           71 bpm. Exam Location:  Inpatient Procedure: 2D Echo, Cardiac Doppler and Color Doppler Indications:    Q76.19 Acute diastolic (congestive) heart failure  History:        Patient has no prior history of Echocardiogram examinations.                 Risk Factors:Hypertension.  Sonographer:    Merrie Roof RDCS Referring Phys: [id number] [place]  1. Left ventricular ejection fraction, by estimation, is 60 to 65%. The left ventricle has normal function. The left ventricle has no regional wall motion abnormalities. There is severe left ventricular hypertrophy. Elevated EDP by E/E' velocity.  2. Right ventricular systolic function is normal. The right ventricular size is normal.  3. The mitral valve is normal in structure. Mild mitral valve regurgitation. No evidence of mitral stenosis.  4. The aortic valve is tricuspid. Aortic valve regurgitation is mild. No aortic stenosis is present.  5. The inferior vena cava is normal  in  size with greater than 50% respiratory variability, suggesting right atrial pressure of 3 mmHg. FINDINGS  Left Ventricle: Left ventricular ejection fraction, by estimation, is 60 to 65%. The left ventricle has normal function. The left ventricle has no regional wall motion abnormalities. The left ventricular internal cavity size was normal in size. There is  severe left ventricular hypertrophy. Elevated EDP by E/E' velocity. Right Ventricle: The right ventricular size is normal. No increase in right ventricular wall thickness. Right ventricular systolic function is normal. Left Atrium: Left atrial size was normal in size. Right Atrium: Right atrial size was normal in size. Pericardium: Trivial pericardial effusion is present. The pericardial effusion is posterior to the left ventricle and localized near the right atrium. Mitral Valve: The mitral valve is normal in structure. Normal mobility of the mitral valve leaflets. Mild mitral valve regurgitation. No evidence of mitral valve stenosis. Tricuspid Valve: The tricuspid valve is normal in structure. Tricuspid valve regurgitation is mild . No evidence of tricuspid stenosis. Aortic Valve: The aortic valve is tricuspid. Aortic valve regurgitation is mild. No aortic stenosis is present. Pulmonic Valve: The pulmonic valve was normal in structure. Pulmonic valve regurgitation is mild. No evidence of pulmonic stenosis. Aorta: The aortic root is normal in size and structure. Venous: The inferior vena cava is normal in size with greater than 50% respiratory variability, suggesting right atrial pressure of 3 mmHg. IAS/Shunts: No atrial level shunt detected by color flow Doppler.   LV Volumes (MOD) LV vol d, MOD A4C: 90.4 ml Diastology LV vol s, MOD A4C: 36.5 ml LV e' lateral:   7.07 cm/s LV SV MOD A4C:     90.4 ml LV E/e' lateral: 13.5                            LV e' medial:    6.08 cm/s                            LV E/e' medial:  15.6  RIGHT VENTRICLE             IVC RV  Basal diam:  3.80 cm     IVC diam: 1.80 cm RV S prime:     18.00 cm/s TAPSE (M-mode): 2.7 cm LEFT ATRIUM            Index LA Vol (A4C): 120.0 ml 62.74 ml/m  AORTIC VALVE LVOT Vmax:   89.40 cm/s LVOT Vmean:  60.100 cm/s LVOT VTI:    0.181 m  AORTA Ao Asc diam: 3.30 cm MITRAL VALVE MV Area (PHT): 4.49 cm    SHUNTS MV Decel Time: 169 msec    Systemic VTI: 0.18 m MV E velocity: 95.10 cm/s MV A velocity: 42.00 cm/s MV E/A ratio:  2.26 Jenkins Rouge MD Electronically signed by Jenkins Rouge MD Signature Date/Time: 06/19/2019/3:29:11 PM    Final         Scheduled Meds: . aspirin EC  81 mg Oral Daily  . atorvastatin  40 mg Oral Daily  . furosemide  40 mg Intravenous Daily  . heparin  5,000 Units Subcutaneous Q8H  . hydrALAZINE  10 mg Oral Q8H  . insulin aspart  0-15 Units Subcutaneous TID WC  . insulin aspart  0-5 Units Subcutaneous QHS  . isosorbide mononitrate  15 mg Oral Daily  . sodium chloride flush  3 mL Intravenous Once   Continuous Infusions:  LOS: 1 day    Time spent: 25 minutes    Barb Merino, MD [facility] Pager [phone]

## 2019-06-20 NOTE — Plan of Care (Signed)

## 2019-06-20 NOTE — Progress Notes (Signed)
Patient refused heparin injection. States he will take the next dose at 0600, 06/21/19. Patient educated on medication importance and rationale for Heparin. The prevention of blood clots due to decreased mobility, while being inpatient.

## 2019-06-21 LAB — CBC WITH DIFFERENTIAL/PLATELET
Abs Immature Granulocytes: 0.02 10*3/uL (ref 0.00–0.07)
Basophils Absolute: 0 10*3/uL (ref 0.0–0.1)
Basophils Relative: 0 %
Eosinophils Absolute: 0.3 10*3/uL (ref 0.0–0.5)
Eosinophils Relative: 6 %
HCT: 33.5 % — ABNORMAL LOW (ref 39.0–52.0)
Hemoglobin: 11.4 g/dL — ABNORMAL LOW (ref 13.0–17.0)
Immature Granulocytes: 0 %
Lymphocytes Relative: 27 %
Lymphs Abs: 1.3 10*3/uL (ref 0.7–4.0)
MCH: 26.9 pg (ref 26.0–34.0)
MCHC: 34 g/dL (ref 30.0–36.0)
MCV: 79 fL — ABNORMAL LOW (ref 80.0–100.0)
Monocytes Absolute: 0.4 10*3/uL (ref 0.1–1.0)
Monocytes Relative: 9 %
Neutro Abs: 2.9 10*3/uL (ref 1.7–7.7)
Neutrophils Relative %: 58 %
Platelets: 276 10*3/uL (ref 150–400)
RBC: 4.24 MIL/uL (ref 4.22–5.81)
RDW: 13.5 % (ref 11.5–15.5)
WBC: 5 10*3/uL (ref 4.0–10.5)
nRBC: 0 % (ref 0.0–0.2)

## 2019-06-21 LAB — BRAIN NATRIURETIC PEPTIDE: B Natriuretic Peptide: 168.5 pg/mL — ABNORMAL HIGH (ref 0.0–100.0)

## 2019-06-21 LAB — BASIC METABOLIC PANEL
Anion gap: 7 (ref 5–15)
BUN: 11 mg/dL (ref 6–20)
CO2: 26 mmol/L (ref 22–32)
Calcium: 7.9 mg/dL — ABNORMAL LOW (ref 8.9–10.3)
Chloride: 102 mmol/L (ref 98–111)
Creatinine, Ser: 1.79 mg/dL — ABNORMAL HIGH (ref 0.61–1.24)
GFR calc Af Amer: 51 mL/min — ABNORMAL LOW (ref >60)
GFR calc non Af Amer: 44 mL/min — ABNORMAL LOW (ref >60)
Glucose, Bld: 199 mg/dL — ABNORMAL HIGH (ref 70–99)
Potassium: 3.4 mmol/L — ABNORMAL LOW (ref 3.5–5.1)
Sodium: 135 mmol/L (ref 135–145)

## 2019-06-21 LAB — MAGNESIUM: Magnesium: 1.4 mg/dL — ABNORMAL LOW (ref 1.7–2.4)

## 2019-06-21 LAB — GLUCOSE, CAPILLARY: Glucose-Capillary: 176 mg/dL — ABNORMAL HIGH (ref 70–99)

## 2019-06-21 MED ORDER — ATORVASTATIN CALCIUM 40 MG PO TABS: 40.0000 mg | ORAL_TABLET | Freq: Every day | ORAL | 2 refills | Status: DC

## 2019-06-21 MED ORDER — GLIPIZIDE 5 MG PO TABS: 2.5000 mg | ORAL_TABLET | Freq: Two times a day (BID) | ORAL | 3 refills | Status: DC

## 2019-06-21 MED ORDER — MAGNESIUM SULFATE 2 GM/50ML IV SOLN
2.0000 g | Freq: Once | INTRAVENOUS | Status: AC
  Administered 2019-06-21: 2 g via INTRAVENOUS
  Filled 2019-06-21: qty 50

## 2019-06-21 MED ORDER — FUROSEMIDE 20 MG PO TABS
20.0000 mg | ORAL_TABLET | Freq: Every day | ORAL | 3 refills | Status: DC
Start: 2019-06-21 — End: 2020-06-22

## 2019-06-21 MED ORDER — POTASSIUM CHLORIDE CRYS ER 20 MEQ PO TBCR
40.0000 meq | EXTENDED_RELEASE_TABLET | Freq: Two times a day (BID) | ORAL | Status: DC
  Administered 2019-06-21: 40 meq via ORAL
  Filled 2019-06-21: qty 2

## 2019-06-21 MED ORDER — HYDRALAZINE HCL 25 MG PO TABS: 25.0000 mg | ORAL_TABLET | Freq: Three times a day (TID) | ORAL | 2 refills | Status: DC

## 2019-06-21 MED ORDER — ISOSORBIDE MONONITRATE ER 30 MG PO TB24: 30.0000 mg | ORAL_TABLET | Freq: Every day | ORAL | 2 refills | Status: DC

## 2019-06-21 MED ORDER — MAGNESIUM 400 MG PO TABS: 400.0000 mg | ORAL_TABLET | Freq: Two times a day (BID) | ORAL | 0 refills | Status: AC

## 2019-06-21 MED ORDER — METFORMIN HCL 1000 MG PO TABS: 1000.0000 mg | ORAL_TABLET | Freq: Two times a day (BID) | ORAL | 3 refills | Status: DC

## 2019-06-21 MED ORDER — ASPIRIN 81 MG PO TBEC: 81.0000 mg | DELAYED_RELEASE_TABLET | Freq: Every day | ORAL | 2 refills | Status: AC

## 2019-06-21 MED ORDER — POTASSIUM CHLORIDE ER 20 MEQ PO TBCR: 10.0000 meq | EXTENDED_RELEASE_TABLET | Freq: Every day | ORAL | 0 refills | Status: DC

## 2019-06-21 NOTE — Discharge Summary (Signed)
Physician Discharge Summary  Timothy [name] RDE:[id number] DOB: March 13, 1970 DOA: 06/19/2019  PCP: Patient, No Pcp Per  Admit date: 06/19/2019 Discharge date: 06/21/2019  Admitted From: Home Disposition: Home  Recommendations for Outpatient Follow-up:  1. Follow up with PCP in 1-2 weeks 2. Keep blood sugar checks and blood pressure reports at home.   Discharge Condition: Stable CODE STATUS: Full code Diet recommendation: Low-salt, low-carb diet  Discharge summary: 49 year old gentleman with history of type 2 diabetes intermittently taking Metformin, no regular medical follow-up presented to the emergency room with exertional dyspnea ongoing for more than a month, worse for 3 days.  Complained of 3 pillow orthopnea and PND.  Also had developed foot swelling.  In the emergency room blood pressure 199/116.  On room air.  Creatinine 1.7.  Troponin 38.  BNP 367.  Chest x-ray with bilateral prominent bronchovascular markings.  Admitted with uncontrolled blood pressures, diastolic dysfunction.    # Acute diastolic congestive heart failure: Due to underlying untreated hypertension.  Clinically improving.  Echocardiogram with normal ejection fraction and severe left ventricular hypertrophy.  Clinically improving with IV diuresis.  Will change to oral Lasix along with potassium supplement. ACE inhibitor is not indicated due to normal ejection fraction.  # Hypertensive urgency: Blood pressures were uncontrolled.  Never treated for high blood pressures in the past.  Patient is started on hydralazine, nitrates.  Blood pressures already improving.  We will continue.    # Hyperlipidemia: LDL more than 130.  Started on Lipitor.  We will continue.  # Renal function abnormalities: Probably chronic kidney disease stage IIIa.  Minimal proteinuria.  Will need close monitoring outpatient.  # Type 2 diabetes: Uncontrolled with hyperglycemia.  A1c 9.9.  On Metformin at home that is also he is taking  intermittently.    Will continue Metformin, increased dose.  Added glipizide low-dose.  Detail education done.  Patient will keep a logbook up blood sugars at home.  Aggressive lifestyle changes and dietary modification discussed.    # Hypokalemia/hypomagnesemia: Aggressively replaced before discharge.  Will also send oral replacement to pharmacy.  Patient is medically stable to be discharged home today.  Will refer to new primary care physician for follow-up.  He was prescribed enough medications for 48-month supply.   Discharge Diagnoses:  Active Problems:   Elevated troponin   Acute CHF (congestive heart failure) (HCC)   Hypoalbuminemia   Hypertensive urgency   AKI (acute kidney injury) (HCC)   CHF (congestive heart failure) ([facility])   Accelerated hypertension    Discharge Instructions  Discharge Instructions    (HEART FAILURE PATIENTS) Call MD:  Anytime you have any of the following symptoms: 1) 3 pound weight gain in 24 hours or 5 pounds in 1 week 2) shortness of breath, with or without a dry hacking cough 3) swelling in the hands, feet or stomach 4) if you have to sleep on extra pillows at night in order to breathe.   Complete by: As directed    Amb Referral to HF Clinic   Complete by: As directed    Amb Referral to [name]   Complete by: As directed    Diagnosis: Heart Failure (see criteria below if ordering [name])   Heart Failure Type: Chronic Diastolic   After initial evaluation and assessments completed: Virtual Based Care may be provided alone or in conjunction with [facility] based on patient barriers.: Yes   Diet - low sodium heart healthy  Complete by: As directed    Discharge instructions   Complete by: As directed    Check your blood sugars two times a day and keep record and bring it to doctors office. Also check your blood pressures and keep a records.   Increase activity slowly   Complete by: As directed      Allergies as of  06/21/2019   No Known Allergies     Medication List    TAKE these medications   aspirin 81 MG EC tablet Take 1 tablet (81 mg total) by mouth daily. Start taking on: Jun 22, 2019   atorvastatin 40 MG tablet Commonly known as: LIPITOR Take 1 tablet (40 mg total) by mouth daily. Start taking on: Jun 22, 2019   furosemide 20 MG tablet Commonly known as: Lasix Take 1 tablet (20 mg total) by mouth daily.   glipiZIDE 5 MG tablet Commonly known as: GLUCOTROL Take 0.5 tablets (2.5 mg total) by mouth 2 (two) times daily before a meal.   hydrALAZINE 25 MG tablet Commonly known as: APRESOLINE Take 1 tablet (25 mg total) by mouth 3 (three) times daily.   isosorbide mononitrate 30 MG 24 hr tablet Commonly known as: IMDUR Take 1 tablet (30 mg total) by mouth daily. Start taking on: Jun 22, 2019   Magnesium 400 MG Tabs Take 400 mg by mouth 2 (two) times daily for 14 days.   metFORMIN 1000 MG tablet Commonly known as: GLUCOPHAGE Take 1 tablet (1,000 mg total) by mouth 2 (two) times daily with a meal. What changed:   medication strength  how much to take   Potassium Chloride ER 20 MEQ Tbcr Take 10 mEq by mouth daily.       No Known Allergies   Procedures/Studies: DG Chest 2 View  Result Date: 06/19/2019 CLINICAL DATA:  Shortness of breath for 2 days with dry cough. EXAM: CHEST - 2 VIEW COMPARISON:  None. FINDINGS: Heart size is within normal limits. There is patchy bilateral perihilar and bibasilar airspace opacities. Upper lungs are relatively clear. No pleural effusion or pneumothorax is seen. Osseous structures about the chest are unremarkable. IMPRESSION: Patchy bilateral perihilar and bibasilar airspace opacities. Differential includes multifocal pneumonia, asymmetric edema related to volume overload, hypersensitivity pneumonitis, and respiratory bronchiolitis. Electronically Signed   By: Franki Cabot M.D.   On: 06/19/2019 11:02   ECHOCARDIOGRAM COMPLETE  Result Date:  06/19/2019    ECHOCARDIOGRAM REPORT   Patient Name:   Timothy [name] Date of Exam: 06/19/2019 Medical Rec #:  [id number]             Height:       66.0 in Accession #:    [id number]            Weight:       180.0 lb Date of Birth:  11-Dec-1970             BSA:          1.913 m Patient Age:    88 years              BP:           188/108 mmHg Patient Gender: M                     HR:           71 bpm. Exam Location:  Inpatient Procedure: 2D Echo, Cardiac Doppler and Color Doppler Indications:    W29.93 Acute diastolic (  congestive) heart failure  History:        Patient has no prior history of Echocardiogram examinations.                 Risk Factors:Hypertension.  Sonographer:    Merrie Roof RDCS Referring Phys: [id number] [place]  1. Left ventricular ejection fraction, by estimation, is 60 to 65%. The left ventricle has normal function. The left ventricle has no regional wall motion abnormalities. There is severe left ventricular hypertrophy. Elevated EDP by E/E' velocity.  2. Right ventricular systolic function is normal. The right ventricular size is normal.  3. The mitral valve is normal in structure. Mild mitral valve regurgitation. No evidence of mitral stenosis.  4. The aortic valve is tricuspid. Aortic valve regurgitation is mild. No aortic stenosis is present.  5. The inferior vena cava is normal in size with greater than 50% respiratory variability, suggesting right atrial pressure of 3 mmHg. FINDINGS  Left Ventricle: Left ventricular ejection fraction, by estimation, is 60 to 65%. The left ventricle has normal function. The left ventricle has no regional wall motion abnormalities. The left ventricular internal cavity size was normal in size. There is  severe left ventricular hypertrophy. Elevated EDP by E/E' velocity. Right Ventricle: The right ventricular size is normal. No increase in right ventricular wall thickness. Right ventricular systolic function is normal. Left Atrium:  Left atrial size was normal in size. Right Atrium: Right atrial size was normal in size. Pericardium: Trivial pericardial effusion is present. The pericardial effusion is posterior to the left ventricle and localized near the right atrium. Mitral Valve: The mitral valve is normal in structure. Normal mobility of the mitral valve leaflets. Mild mitral valve regurgitation. No evidence of mitral valve stenosis. Tricuspid Valve: The tricuspid valve is normal in structure. Tricuspid valve regurgitation is mild . No evidence of tricuspid stenosis. Aortic Valve: The aortic valve is tricuspid. Aortic valve regurgitation is mild. No aortic stenosis is present. Pulmonic Valve: The pulmonic valve was normal in structure. Pulmonic valve regurgitation is mild. No evidence of pulmonic stenosis. Aorta: The aortic root is normal in size and structure. Venous: The inferior vena cava is normal in size with greater than 50% respiratory variability, suggesting right atrial pressure of 3 mmHg. IAS/Shunts: No atrial level shunt detected by color flow Doppler.   LV Volumes (MOD) LV vol d, MOD A4C: 90.4 ml Diastology LV vol s, MOD A4C: 36.5 ml LV e' lateral:   7.07 cm/s LV SV MOD A4C:     90.4 ml LV E/e' lateral: 13.5                            LV e' medial:    6.08 cm/s                            LV E/e' medial:  15.6  RIGHT VENTRICLE             IVC RV Basal diam:  3.80 cm     IVC diam: 1.80 cm RV S prime:     18.00 cm/s TAPSE (M-mode): 2.7 cm LEFT ATRIUM            Index LA Vol (A4C): 120.0 ml 62.74 ml/m  AORTIC VALVE LVOT Vmax:   89.40 cm/s LVOT Vmean:  60.100 cm/s LVOT VTI:    0.181 m  AORTA Ao Asc diam: 3.30 cm MITRAL VALVE  MV Area (PHT): 4.49 cm    SHUNTS MV Decel Time: 169 msec    Systemic VTI: 0.18 m MV E velocity: 95.10 cm/s MV A velocity: 42.00 cm/s MV E/A ratio:  2.26 Jenkins Rouge MD Electronically signed by Jenkins Rouge MD Signature Date/Time: 06/19/2019/3:29:11 PM    Final     Subjective: Patient was seen and examined.   No overnight events.  He feels his breathing is better.  His legs have better now and not swollen.  He was able to sleep flat on the bed.   Discharge Exam: Vitals:   06/21/19 0620 06/21/19 0811  BP: (!) 183/113 (!) 146/79  Pulse:  79  Resp:  19  Temp:  99.4 F (37.4 C)  SpO2:  99%   Vitals:   06/20/19 2105 06/21/19 0520 06/21/19 0620 06/21/19 0811  BP: (!) 141/86 (!) 197/122 (!) 183/113 (!) 146/79  Pulse: 68 75  79  Resp: 18 18  19   Temp: 99.1 F (37.3 C) 99 F (37.2 C)  99.4 F (37.4 C)  TempSrc: Oral Oral  Oral  SpO2: 98% 98%  99%  Weight:  82.7 kg    Height:        General: Pt is alert, awake, not in acute distress Cardiovascular: RRR, S1/S2 +, no rubs, no gallops Respiratory: CTA bilaterally, no wheezing, no rhonchi Abdominal: Soft, NT, ND, bowel sounds + Extremities: no edema, no cyanosis    The results of significant diagnostics from this hospitalization (including imaging, microbiology, ancillary and laboratory) are listed below for reference.     Microbiology: Recent Results (from the past 240 hour(s))  SARS Coronavirus 2 by RT PCR (hospital order, performed in [facility] hospital lab) Nasopharyngeal Nasopharyngeal Swab     Status: None   Collection Time: 06/19/19  1:24 PM   Specimen: Nasopharyngeal Swab  Result Value Ref Range Status   SARS Coronavirus 2 NEGATIVE NEGATIVE Final    Comment: (NOTE) SARS-CoV-2 target nucleic acids are NOT DETECTED. The SARS-CoV-2 RNA is generally detectable in upper and lower respiratory specimens during the acute phase of infection. The lowest concentration of SARS-CoV-2 viral copies this assay can detect is 250 copies / mL. A negative result does not preclude SARS-CoV-2 infection and should not be used as the sole basis for treatment or other patient management decisions.  A negative result may occur with improper specimen collection / handling, submission of specimen other than nasopharyngeal swab, presence of viral  mutation(s) within the areas targeted by this assay, and inadequate number of viral copies (<250 copies / mL). A negative result must be combined with clinical observations, patient history, and epidemiological information. Fact Sheet for Patients:   StrictlyIdeas.no Fact Sheet for Healthcare Providers: BankingDealers.co.za This test is not yet approved or cleared  by the [place] FDA and has been authorized for detection and/or diagnosis of SARS-CoV-2 by FDA under an Emergency Use Authorization (EUA).  This EUA will remain in effect (meaning this test can be used) for the duration of the COVID-19 declaration under Section 564(b)(1) of the Act, 21 U.S.C. section 360bbb-3(b)(1), unless the authorization is terminated or revoked sooner. Performed at [facility], [place] [address] [place], [place] [address]      Labs: BNP (last 3 results) Recent Labs    06/19/19 0920 06/20/19 1036 06/21/19 0511  BNP 367.3* 301.2* 979.8*   Basic Metabolic Panel: Recent Labs  Lab 06/19/19 0920 06/20/19 1036 06/20/19 1255 06/21/19 0511  NA 139  --  136  135  K 4.0  --  3.6 3.4*  CL 107  --  100 102  CO2 26  --  28 26  GLUCOSE 164*  --  212* 199*  BUN 10  --  12 11  CREATININE 1.76*  --  2.01* 1.79*  CALCIUM 8.3*  --  8.0* 7.9*  MG  --  1.6*  --  1.4*   Liver Function Tests: Recent Labs  Lab 06/19/19 0920  AST 18  ALT 18  ALKPHOS 59  BILITOT 1.0  PROT 6.5  ALBUMIN 2.6*   No results for input(s): LIPASE, AMYLASE in the last 168 hours. No results for input(s): AMMONIA in the last 168 hours. CBC: Recent Labs  Lab 06/19/19 0920 06/20/19 1036 06/21/19 0511  WBC 4.8 5.1 5.0  NEUTROABS  --  3.2 2.9  HGB 11.4* 10.8* 11.4*  HCT 34.1* 32.1* 33.5*  MCV 80.0 80.0 79.0*  PLT 277 278 276   Cardiac Enzymes: No results for input(s): CKTOTAL, CKMB, CKMBINDEX, TROPONINI in the last 168 hours. BNP: Invalid input(s):  POCBNP CBG: Recent Labs  Lab 06/20/19 0627 06/20/19 1142 06/20/19 1604 06/20/19 2142 06/21/19 0616  GLUCAP 174* 149* 153* 126* 176*   D-Dimer No results for input(s): DDIMER in the last 72 hours. Hgb A1c Recent Labs    06/19/19 1450  HGBA1C 9.9*   Lipid Profile Recent Labs    06/19/19 1450  CHOL 207*  HDL 38*  LDLCALC 154*  TRIG 76  CHOLHDL 5.4   Thyroid function studies Recent Labs    06/19/19 1450  TSH 0.947   Anemia work up No results for input(s): VITAMINB12, FOLATE, FERRITIN, TIBC, IRON, RETICCTPCT in the last 72 hours. Urinalysis    Component Value Date/Time   COLORURINE COLORLESS (A) 06/19/2019 1320   APPEARANCEUR CLEAR 06/19/2019 1320   LABSPEC 1.004 (L) 06/19/2019 1320   PHURINE 7.0 06/19/2019 1320   GLUCOSEU NEGATIVE 06/19/2019 1320   HGBUR SMALL (A) 06/19/2019 1320   BILIRUBINUR NEGATIVE 06/19/2019 1320   KETONESUR NEGATIVE 06/19/2019 1320   PROTEINUR 30 (A) 06/19/2019 1320   NITRITE NEGATIVE 06/19/2019 1320   LEUKOCYTESUR NEGATIVE 06/19/2019 1320   Sepsis Labs Invalid input(s): PROCALCITONIN,  WBC,  LACTICIDVEN Microbiology Recent Results (from the past 240 hour(s))  SARS Coronavirus 2 by RT PCR (hospital order, performed in [facility] hospital lab) Nasopharyngeal Nasopharyngeal Swab     Status: None   Collection Time: 06/19/19  1:24 PM   Specimen: Nasopharyngeal Swab  Result Value Ref Range Status   SARS Coronavirus 2 NEGATIVE NEGATIVE Final    Comment: (NOTE) SARS-CoV-2 target nucleic acids are NOT DETECTED. The SARS-CoV-2 RNA is generally detectable in upper and lower respiratory specimens during the acute phase of infection. The lowest concentration of SARS-CoV-2 viral copies this assay can detect is 250 copies / mL. A negative result does not preclude SARS-CoV-2 infection and should not be used as the sole basis for treatment or other patient management decisions.  A negative result may occur with improper specimen collection /  handling, submission of specimen other than nasopharyngeal swab, presence of viral mutation(s) within the areas targeted by this assay, and inadequate number of viral copies (<250 copies / mL). A negative result must be combined with clinical observations, patient history, and epidemiological information. Fact Sheet for Patients:   StrictlyIdeas.no Fact Sheet for Healthcare Providers: BankingDealers.co.za This test is not yet approved or cleared  by the [place] FDA and has been authorized for detection and/or diagnosis of  SARS-CoV-2 by FDA under an Emergency Use Authorization (EUA).  This EUA will remain in effect (meaning this test can be used) for the duration of the COVID-19 declaration under Section 564(b)(1) of the Act, 21 U.S.C. section 360bbb-3(b)(1), unless the authorization is terminated or revoked sooner. Performed at [facility], [place] [address], [place], [place] [address]      Time coordinating discharge: 35 minutes  SIGNED:   Barb Merino, MD  [facility] 06/21/2019, 8:46 AM

## 2019-06-21 NOTE — Discharge Instructions (Signed)
Diabetes Basics    Diabetes (diabetes mellitus) is a long-term (chronic) disease. It occurs when the body does not properly use sugar (glucose) that is released from food after you eat.  Diabetes may be caused by one or both of these problems:  Your pancreas does not make enough of a hormone called insulin.  Your body does not react in a normal way to insulin that it makes. Insulin lets sugars (glucose) go into cells in your body. This gives you energy. If you have diabetes, sugars cannot get into cells. This causes high blood sugar (hyperglycemia).  Follow these instructions at home: How is diabetes treated? You may need to take insulin or other diabetes medicines daily to keep your blood sugar in balance. Take your diabetes medicines every day as told by your doctor. List your diabetes medicines here:  Diabetes medicines 1. Name of medicine: ______________________________ ? Amount (dose): _______________ Time (a.m./p.m.): _______________ Notes: ___________________________________ 2. Name of medicine: ______________________________ ? Amount (dose): _______________ Time (a.m./p.m.): _______________ Notes: ___________________________________ 3. Name of medicine: ______________________________ ? Amount (dose): _______________ Time (a.m./p.m.): _______________ Notes: ___________________________________   How do I manage my blood sugar?    Check your blood sugar levels using a blood glucose monitor as directed by your doctor. Your doctor will set treatment goals for you. Generally, you should have these blood sugar levels:  Before meals (preprandial): 80-130 mg/dL (4.4-7.2 mmol/L).  After meals (postprandial): below 180 mg/dL (10 mmol/L).  A1c level: less than 7%. Write down the times that you will check your blood sugar levels:  Blood sugar checks  Time: _______________ Notes: ___________________________________  Time: _______________ Notes:  ___________________________________  Time: _______________ Notes: ___________________________________  Time: _______________ Notes: ___________________________________  Time: _______________ Notes: ___________________________________  Time: _______________ Notes: ___________________________________   What do I need to know about low blood sugar? Low blood sugar is called hypoglycemia. This is when blood sugar is at or below 70 mg/dL (3.9 mmol/L). Symptoms may include: 1. Feeling: ? Hungry. ? Worried or nervous (anxious). ? Sweaty and clammy. ? Confused. ? Dizzy. ? Sleepy. ? Sick to your stomach (nauseous). 2. Having: ? A fast heartbeat. ? A headache. ? A change in your vision. ? Tingling or no feeling (numbness) around the mouth, lips, or tongue. ? Jerky movements that you cannot control (seizure). 3. Having trouble with: ? Moving (coordination). ? Sleeping. ? Passing out (fainting). ? Getting upset easily (irritability).  Treating low blood sugar To treat low blood sugar, eat or drink something sugary right away. If you can think clearly and swallow safely, follow the 15:15 rule:  1. Take 15 grams of a fast-acting carb (carbohydrate). Talk with your doctor about how much you should take. 2. Some fast-acting carbs are: ? Sugar tablets (glucose pills). Take 3-4 glucose pills. ? 6-8 pieces of hard candy. ? 4-6 oz (120-150 mL) of fruit juice. ? 4-6 oz (120-150 mL) of regular (not diet) soda. ? 1 Tbsp (15 mL) honey or sugar.  3. Check your blood sugar 15 minutes after you take the carb. 4. If your blood sugar is still at or below 70 mg/dL (3.9 mmol/L), take 15 grams of a carb again. 5. If your blood sugar does not go above 70 mg/dL (3.9 mmol/L) after 3 tries, get help right away. 6. After your blood sugar goes back to normal, eat a meal or a snack within 1 hour.  Treating very low blood sugar If your blood sugar is at or below 54 mg/dL (3 mmol/L), you  have very low blood  sugar (severe hypoglycemia). This is an emergency. Do not wait to see if the symptoms will go away. Get medical help right away. Call your local emergency services (911 in the [place] Do not drive yourself to the hospital. Questions to ask your health care provider  Do I need to meet with a diabetes educator?  What equipment will I need to care for myself at home?  What diabetes medicines do I need? When should I take them?  How often do I need to check my blood sugar?  What number can I call if I have questions?  When is my next doctor's visit?  Where can I find a support group for people with diabetes?  Where to find more information  American Diabetes Association: www.diabetes.org  American Association of Diabetes Educators: www.diabeteseducator.org/patient-resources  Contact a doctor if: 1. Your blood sugar is at or above 240 mg/dL (13.3 mmol/L) for 2 days in a row. 2. You have been sick or have had a fever for 2 days or more, and you are not getting better. 3. You have any of these problems for more than 6 hours: ? You cannot eat or drink. ? You feel sick to your stomach (nauseous). ? You throw up (vomit). ? You have watery poop (diarrhea).  Get help right away if: 1. Your blood sugar is lower than 54 mg/dL (3 mmol/L). 2. You get confused. 3. You have trouble: ? Thinking clearly. ? Breathing.  Summary  Diabetes (diabetes mellitus) is a long-term (chronic) disease. It occurs when the body does not properly use sugar (glucose) that is released from food after digestion.  Take insulin and diabetes medicines as told.  Check your blood sugar every day, as often as told.  Keep all follow-up visits as told by your doctor. This is important. This information is not intended to replace advice given to you by your health care provider. Make sure you discuss any questions you have with your health care provider.  =========================================  Diabetes  [place] and Nutrition, Adult When you have diabetes (diabetes mellitus), it is very important to have healthy eating habits because your blood sugar (glucose) levels are greatly affected by what you eat and drink. Eating healthy foods in the appropriate amounts, at about the same times every day, can help you:  Control your blood glucose.  Lower your risk of heart disease.  Improve your blood pressure.  Reach or maintain a healthy weight. Every person with diabetes is different, and each person has different needs for a meal plan. Your health care provider may recommend that you work with a diet and nutrition specialist (dietitian) to make a meal plan that is best for you. Your meal plan may vary depending on factors such as:  The calories you need.  The medicines you take.  Your weight.  Your blood glucose, blood pressure, and cholesterol levels.  Your activity level.  Other health conditions you have, such as heart or kidney disease.  How do carbohydrates affect me? Carbohydrates, also called carbs, affect your blood glucose level more than any other type of food. Eating carbs naturally raises the amount of glucose in your blood. Carb counting is a method for keeping track of how many carbs you eat. Counting carbs is important to keep your blood glucose at a healthy level, especially if you use insulin or take certain oral diabetes medicines. It is important to know how many carbs you can safely have in each meal.  This is different for every person. Your dietitian can help you calculate how many carbs you should have at each meal and for each snack. Foods that contain carbs include:  Bread, cereal, rice, pasta, and crackers.  Potatoes and corn.  Peas, beans, and lentils.  Milk and yogurt.  Fruit and juice.  Desserts, such as cakes, cookies, ice cream, and candy.  How does alcohol affect me? Alcohol can cause a sudden decrease in blood glucose (hypoglycemia), especially if  you use insulin or take certain oral diabetes medicines. Hypoglycemia can be a life-threatening condition. Symptoms of hypoglycemia (sleepiness, dizziness, and confusion) are similar to symptoms of having too much alcohol. If your health care provider says that alcohol is safe for you, follow these guidelines:  Limit alcohol intake to no more than 1 drink per day for nonpregnant women and 2 drinks per day for men. One drink equals 12 oz of beer, 5 oz of wine, or 1 oz of hard liquor.  Do not drink on an empty stomach.  Keep yourself hydrated with water, diet soda, or unsweetened iced tea.  Keep in mind that regular soda, juice, and other mixers may contain a lot of sugar and must be counted as carbs.  What are tips for following this plan?    Reading food labels  Start by checking the serving size on the "Nutrition Facts" label of packaged foods and drinks. The amount of calories, carbs, fats, and other nutrients listed on the label is based on one serving of the item. Many items contain more than one serving per package.  Check the total grams (g) of carbs in one serving. You can calculate the number of servings of carbs in one serving by dividing the total carbs by 15. For example, if a food has 30 g of total carbs, it would be equal to 2 servings of carbs.  Check the number of grams (g) of saturated and trans fats in one serving. Choose foods that have low or no amount of these fats.  Check the number of milligrams (mg) of salt (sodium) in one serving. Most people should limit total sodium intake to less than 2,300 mg per day.  Always check the nutrition information of foods labeled as "low-fat" or "nonfat". These foods may be higher in added sugar or refined carbs and should be avoided.  Talk to your dietitian to identify your daily goals for nutrients listed on the label.  Shopping  Avoid buying canned, premade, or processed foods. These foods tend to be high in fat, sodium, and  added sugar.  Shop around the outside edge of the grocery store. This includes fresh fruits and vegetables, bulk grains, fresh meats, and fresh dairy.  Cooking  Use low-heat cooking methods, such as baking, instead of high-heat cooking methods like deep frying.  Cook using healthy oils, such as olive, canola, or sunflower oil.  Avoid cooking with butter, cream, or high-fat meats.  Meal planning 4. Eat meals and snacks regularly, preferably at the same times every day. Avoid going long periods of time without eating. 5. Eat foods high in fiber, such as fresh fruits, vegetables, beans, and whole grains. Talk to your dietitian about how many servings of carbs you can eat at each meal. 6. Eat 4-6 ounces (oz) of lean protein each day, such as lean meat, chicken, fish, eggs, or tofu. One oz of lean protein is equal to: ? 1 oz of meat, chicken, or fish. ? 1 egg. ?  cup  of tofu. 7. Eat some foods each day that contain healthy fats, such as avocado, nuts, seeds, and fish.  Lifestyle 1. Check your blood glucose regularly. 2. Exercise regularly as told by your health care provider. This may include: ? 150 minutes of moderate-intensity or vigorous-intensity exercise each week. This could be brisk walking, biking, or water aerobics. ? Stretching and doing strength exercises, such as yoga or weightlifting, at least 2 times a week. 3. Take medicines as told by your health care provider. 4. Do not use any products that contain nicotine or tobacco, such as cigarettes and e-cigarettes. If you need help quitting, ask your health care provider. 5. Work with a Social worker or diabetes educator to identify strategies to manage stress and any emotional and social challenges.  Questions to ask a health care provider  Do I need to meet with a diabetes educator?  Do I need to meet with a dietitian?  What number can I call if I have questions?  When are the best times to check my blood glucose?  Where to  find more information:  American Diabetes Association: diabetes.org  Academy of Nutrition and Dietetics: www.eatright.[employer] of Diabetes and Digestive and Kidney Diseases (NIH): [employer]  Summary  A healthy meal plan will help you control your blood glucose and maintain a healthy lifestyle.  Working with a diet and nutrition specialist (dietitian) can help you make a meal plan that is best for you.  Keep in mind that carbohydrates (carbs) and alcohol have immediate effects on your blood glucose levels. It is important to count carbs and to use alcohol carefully. This information is not intended to replace advice given to you by your health care provider. Make sure you discuss any questions you have with your health care provider.

## 2019-06-30 NOTE — Progress Notes (Deleted)
Patient ID: Timothy [name], male   DOB: 06-23-1970, 49 y.o.   MRN: [id number]  After hospitalization 5/15-5/17/2021  Discharge summary: 49 year old gentleman with history of type 2 diabetes intermittently taking Metformin, no regular medical follow-up presented to the emergency room with exertional dyspnea ongoing for more than a month, worse for 3 days.  Complained of 3 pillow orthopnea and PND.  Also had developed foot swelling.  In the emergency room blood pressure 199/116.  On room air.  Creatinine 1.7.  Troponin 38.  BNP 367.  Chest x-ray with bilateral prominent bronchovascular markings.  Admitted with uncontrolled blood pressures, diastolic dysfunction.    # Acute diastolic congestive heart failure: Due to underlying untreated hypertension. Clinically improving. Echocardiogram with normal ejection fraction and severe left ventricular hypertrophy.  Clinically improving with IV diuresis.  Will change to oral Lasix along with potassium supplement. ACE inhibitor is not indicated due to normal ejection fraction.  # Hypertensive urgency: Blood pressures wereuncontrolled. Never treated for high blood pressures in the past. Patient is started on hydralazine, nitrates. Blood pressures already improving. We will continue.   # Hyperlipidemia: LDL more than 130. Started on Lipitor. We will continue.  # Renal function abnormalities: Probably chronic kidney disease stage IIIa. Minimal proteinuria. Will need close monitoring outpatient.  # Type 2 diabetes: Uncontrolled with hyperglycemia. A1c 9.9. On Metformin at home that is also he is taking intermittently.   Will continue Metformin, increased dose.  Added glipizide low-dose.  Detail education done.  Patient will keep a logbook up blood sugars at home.  Aggressive lifestyle changes and dietary modification discussed.    # Hypokalemia/hypomagnesemia: Aggressively replaced before discharge.  Will also send oral replacement to  pharmacy.  Patient is medically stable to be discharged home today.  Will refer to new primary care physician for follow-up.  He was prescribed enough medications for 74-month supply.   Discharge Diagnoses:  Active Problems:   Elevated troponin   Acute CHF (congestive heart failure) (HCC)   Hypoalbuminemia   Hypertensive urgency   AKI (acute kidney injury) ([facility])   CHF (congestive heart failure) (HCC)   Accelerated hypertension

## 2019-07-01 ENCOUNTER — Ambulatory Visit: Payer: PRIVATE HEALTH INSURANCE

## 2020-06-22 ENCOUNTER — Other Ambulatory Visit: Payer: Self-pay

## 2020-06-22 ENCOUNTER — Inpatient Hospital Stay (HOSPITAL_COMMUNITY)
Admission: EM | Admit: 2020-06-22 | Discharge: 2020-06-24 | DRG: 291 | Disposition: A | Discharge status: Home / Self Care | Payer: Self-pay | Attending: Internal Medicine | Admitting: Internal Medicine

## 2020-06-22 ENCOUNTER — Inpatient Hospital Stay (HOSPITAL_COMMUNITY): Payer: Self-pay

## 2020-06-22 ENCOUNTER — Emergency Department (HOSPITAL_COMMUNITY): Payer: Self-pay

## 2020-06-22 ENCOUNTER — Encounter (HOSPITAL_COMMUNITY): Payer: Self-pay

## 2020-06-22 DIAGNOSIS — J9601 Acute respiratory failure with hypoxia: Secondary | ICD-10-CM | POA: Diagnosis present

## 2020-06-22 DIAGNOSIS — J810 Acute pulmonary edema: Secondary | ICD-10-CM

## 2020-06-22 DIAGNOSIS — IMO0002 Reserved for concepts with insufficient information to code with codable children: Secondary | ICD-10-CM

## 2020-06-22 DIAGNOSIS — Z20822 Contact with and (suspected) exposure to covid-19: Secondary | ICD-10-CM | POA: Diagnosis present

## 2020-06-22 DIAGNOSIS — D631 Anemia in chronic kidney disease: Secondary | ICD-10-CM | POA: Diagnosis present

## 2020-06-22 DIAGNOSIS — Z28310 Unvaccinated for covid-19: Secondary | ICD-10-CM

## 2020-06-22 DIAGNOSIS — Z9114 Patient's other noncompliance with medication regimen: Secondary | ICD-10-CM

## 2020-06-22 DIAGNOSIS — N170 Acute kidney failure with tubular necrosis: Secondary | ICD-10-CM

## 2020-06-22 DIAGNOSIS — E1122 Type 2 diabetes mellitus with diabetic chronic kidney disease: Secondary | ICD-10-CM | POA: Diagnosis present

## 2020-06-22 DIAGNOSIS — I5031 Acute diastolic (congestive) heart failure: Secondary | ICD-10-CM

## 2020-06-22 DIAGNOSIS — R0602 Shortness of breath: Secondary | ICD-10-CM

## 2020-06-22 DIAGNOSIS — Z79899 Other long term (current) drug therapy: Secondary | ICD-10-CM

## 2020-06-22 DIAGNOSIS — I509 Heart failure, unspecified: Secondary | ICD-10-CM

## 2020-06-22 DIAGNOSIS — E1365 Other specified diabetes mellitus with hyperglycemia: Secondary | ICD-10-CM

## 2020-06-22 DIAGNOSIS — E785 Hyperlipidemia, unspecified: Secondary | ICD-10-CM | POA: Diagnosis present

## 2020-06-22 DIAGNOSIS — Z9111 Patient's noncompliance with dietary regimen: Secondary | ICD-10-CM

## 2020-06-22 DIAGNOSIS — Z7984 Long term (current) use of oral hypoglycemic drugs: Secondary | ICD-10-CM

## 2020-06-22 DIAGNOSIS — I160 Hypertensive urgency: Secondary | ICD-10-CM | POA: Diagnosis present

## 2020-06-22 DIAGNOSIS — I5032 Chronic diastolic (congestive) heart failure: Secondary | ICD-10-CM

## 2020-06-22 DIAGNOSIS — N179 Acute kidney failure, unspecified: Secondary | ICD-10-CM | POA: Diagnosis present

## 2020-06-22 DIAGNOSIS — N19 Unspecified kidney failure: Secondary | ICD-10-CM

## 2020-06-22 DIAGNOSIS — J189 Pneumonia, unspecified organism: Secondary | ICD-10-CM | POA: Diagnosis present

## 2020-06-22 DIAGNOSIS — E1165 Type 2 diabetes mellitus with hyperglycemia: Secondary | ICD-10-CM | POA: Diagnosis present

## 2020-06-22 DIAGNOSIS — I130 Hypertensive heart and chronic kidney disease with heart failure and stage 1 through stage 4 chronic kidney disease, or unspecified chronic kidney disease: Principal | ICD-10-CM | POA: Diagnosis present

## 2020-06-22 DIAGNOSIS — I5033 Acute on chronic diastolic (congestive) heart failure: Secondary | ICD-10-CM | POA: Diagnosis present

## 2020-06-22 DIAGNOSIS — N1831 Chronic kidney disease, stage 3a: Secondary | ICD-10-CM | POA: Diagnosis present

## 2020-06-22 HISTORY — DX: Heart failure, unspecified: I50.9

## 2020-06-22 LAB — CBC WITH DIFFERENTIAL/PLATELET
Abs Immature Granulocytes: 0.03 10*3/uL (ref 0.00–0.07)
Basophils Absolute: 0 10*3/uL (ref 0.0–0.1)
Basophils Relative: 0 %
Eosinophils Absolute: 0.2 10*3/uL (ref 0.0–0.5)
Eosinophils Relative: 2 %
HCT: 37.4 % — ABNORMAL LOW (ref 39.0–52.0)
Hemoglobin: 12.5 g/dL — ABNORMAL LOW (ref 13.0–17.0)
Immature Granulocytes: 0 %
Lymphocytes Relative: 6 %
Lymphs Abs: 0.7 10*3/uL (ref 0.7–4.0)
MCH: 26.7 pg (ref 26.0–34.0)
MCHC: 33.4 g/dL (ref 30.0–36.0)
MCV: 79.9 fL — ABNORMAL LOW (ref 80.0–100.0)
Monocytes Absolute: 0.7 10*3/uL (ref 0.1–1.0)
Monocytes Relative: 6 %
Neutro Abs: 10.5 10*3/uL — ABNORMAL HIGH (ref 1.7–7.7)
Neutrophils Relative %: 86 %
Platelets: 282 10*3/uL (ref 150–400)
RBC: 4.68 MIL/uL (ref 4.22–5.81)
RDW: 13.4 % (ref 11.5–15.5)
WBC: 12.2 10*3/uL — ABNORMAL HIGH (ref 4.0–10.5)
nRBC: 0 % (ref 0.0–0.2)

## 2020-06-22 LAB — URINALYSIS, ROUTINE W REFLEX MICROSCOPIC
Bacteria, UA: NONE SEEN
Bilirubin Urine: NEGATIVE
Glucose, UA: 50 mg/dL — AB
Ketones, ur: NEGATIVE mg/dL
Leukocytes,Ua: NEGATIVE
Nitrite: NEGATIVE
Protein, ur: 100 mg/dL — AB
Specific Gravity, Urine: 1.009 (ref 1.005–1.030)
pH: 5 (ref 5.0–8.0)

## 2020-06-22 LAB — ECHOCARDIOGRAM COMPLETE
Area-P 1/2: 4.77 cm2
Height: 66 in
S' Lateral: 2.9 cm
Weight: 2881.85 oz

## 2020-06-22 LAB — COMPREHENSIVE METABOLIC PANEL
ALT: 12 U/L (ref 0–44)
AST: 17 U/L (ref 15–41)
Albumin: 2.8 g/dL — ABNORMAL LOW (ref 3.5–5.0)
Alkaline Phosphatase: 66 U/L (ref 38–126)
Anion gap: 6 (ref 5–15)
BUN: 21 mg/dL — ABNORMAL HIGH (ref 6–20)
CO2: 22 mmol/L (ref 22–32)
Calcium: 8 mg/dL — ABNORMAL LOW (ref 8.9–10.3)
Chloride: 108 mmol/L (ref 98–111)
Creatinine, Ser: 2.71 mg/dL — ABNORMAL HIGH (ref 0.61–1.24)
GFR, Estimated: 28 mL/min — ABNORMAL LOW (ref >60)
Glucose, Bld: 211 mg/dL — ABNORMAL HIGH (ref 70–99)
Potassium: 4.4 mmol/L (ref 3.5–5.1)
Sodium: 136 mmol/L (ref 135–145)
Total Bilirubin: 0.6 mg/dL (ref 0.3–1.2)
Total Protein: 7 g/dL (ref 6.5–8.1)

## 2020-06-22 LAB — LIPID PANEL
Cholesterol: 185 mg/dL (ref 0–200)
HDL: 29 mg/dL — ABNORMAL LOW (ref >40)
LDL Cholesterol: 137 mg/dL — ABNORMAL HIGH (ref 0–99)
Total CHOL/HDL Ratio: 6.4 RATIO
Triglycerides: 97 mg/dL (ref <150)
VLDL: 19 mg/dL (ref 0–40)

## 2020-06-22 LAB — LACTIC ACID, PLASMA: Lactic Acid, Venous: 1.4 mmol/L (ref 0.5–1.9)

## 2020-06-22 LAB — BRAIN NATRIURETIC PEPTIDE: B Natriuretic Peptide: 218.6 pg/mL — ABNORMAL HIGH (ref 0.0–100.0)

## 2020-06-22 LAB — RESP PANEL BY RT-PCR (FLU A&B, COVID) ARPGX2
Influenza A by PCR: NEGATIVE
Influenza B by PCR: NEGATIVE
SARS Coronavirus 2 by RT PCR: NEGATIVE

## 2020-06-22 LAB — GLUCOSE, CAPILLARY: Glucose-Capillary: 204 mg/dL — ABNORMAL HIGH (ref 70–99)

## 2020-06-22 LAB — HIV ANTIBODY (ROUTINE TESTING W REFLEX): HIV Screen 4th Generation wRfx: NONREACTIVE

## 2020-06-22 LAB — TROPONIN I (HIGH SENSITIVITY)
Troponin I (High Sensitivity): 16 ng/L (ref <18)
Troponin I (High Sensitivity): 18 ng/L — ABNORMAL HIGH (ref <18)

## 2020-06-22 LAB — APTT: aPTT: 31 seconds (ref 24–36)

## 2020-06-22 LAB — PROTIME-INR
INR: 1 (ref 0.8–1.2)
Prothrombin Time: 13.4 seconds (ref 11.4–15.2)

## 2020-06-22 LAB — HEMOGLOBIN A1C
Hgb A1c MFr Bld: 12 % — ABNORMAL HIGH (ref 4.8–5.6)
Mean Plasma Glucose: 297.7 mg/dL

## 2020-06-22 MED ORDER — ACETAMINOPHEN 650 MG RE SUPP: 650.0000 mg | Freq: Four times a day (QID) | RECTAL | Status: DC | PRN

## 2020-06-22 MED ORDER — ACETAMINOPHEN 500 MG PO TABS
1000.0000 mg | ORAL_TABLET | Freq: Once | ORAL | Status: AC
  Administered 2020-06-22: 1000 mg via ORAL
  Filled 2020-06-22: qty 2

## 2020-06-22 MED ORDER — FUROSEMIDE 10 MG/ML IJ SOLN
40.0000 mg | Freq: Two times a day (BID) | INTRAMUSCULAR | Status: DC
  Administered 2020-06-22 – 2020-06-24 (×4): 40 mg via INTRAVENOUS
  Filled 2020-06-22 (×4): qty 4

## 2020-06-22 MED ORDER — SODIUM CHLORIDE 0.9 % IV SOLN
1.0000 g | Freq: Once | INTRAVENOUS | Status: AC
  Administered 2020-06-22: 1 g via INTRAVENOUS
  Filled 2020-06-22: qty 10

## 2020-06-22 MED ORDER — ONDANSETRON HCL 4 MG PO TABS: 4.0000 mg | ORAL_TABLET | Freq: Four times a day (QID) | ORAL | Status: DC | PRN

## 2020-06-22 MED ORDER — ACETAMINOPHEN 500 MG PO TABS: 1000.0000 mg | ORAL_TABLET | Freq: Once | ORAL | Status: DC

## 2020-06-22 MED ORDER — ATORVASTATIN CALCIUM 40 MG PO TABS
40.0000 mg | ORAL_TABLET | Freq: Every day | ORAL | Status: DC
  Administered 2020-06-22 – 2020-06-23 (×2): 40 mg via ORAL
  Filled 2020-06-22 (×2): qty 1

## 2020-06-22 MED ORDER — INSULIN ASPART 100 UNIT/ML IJ SOLN
0.0000 [IU] | Freq: Three times a day (TID) | INTRAMUSCULAR | Status: DC
Start: 2020-06-22 — End: 2020-06-24
  Administered 2020-06-22: 2 [IU] via SUBCUTANEOUS
  Administered 2020-06-23: 5 [IU] via SUBCUTANEOUS
  Administered 2020-06-23: 3 [IU] via SUBCUTANEOUS
  Administered 2020-06-23: 2 [IU] via SUBCUTANEOUS
  Administered 2020-06-24: 5 [IU] via SUBCUTANEOUS
  Administered 2020-06-24: 3 [IU] via SUBCUTANEOUS
  Filled 2020-06-22: qty 0.09

## 2020-06-22 MED ORDER — HYDRALAZINE HCL 10 MG PO TABS
10.0000 mg | ORAL_TABLET | Freq: Three times a day (TID) | ORAL | Status: DC
  Administered 2020-06-22 – 2020-06-24 (×4): 10 mg via ORAL
  Filled 2020-06-22 (×7): qty 1

## 2020-06-22 MED ORDER — ALBUTEROL SULFATE HFA 108 (90 BASE) MCG/ACT IN AERS
2.0000 | INHALATION_SPRAY | RESPIRATORY_TRACT | Status: DC | PRN
  Administered 2020-06-22: 2 via RESPIRATORY_TRACT
  Filled 2020-06-22: qty 6.7

## 2020-06-22 MED ORDER — ISOSORBIDE MONONITRATE ER 30 MG PO TB24
30.0000 mg | ORAL_TABLET | Freq: Every day | ORAL | Status: DC
  Administered 2020-06-22 – 2020-06-24 (×3): 30 mg via ORAL
  Filled 2020-06-22 (×3): qty 1

## 2020-06-22 MED ORDER — ACETAMINOPHEN 325 MG PO TABS: 650.0000 mg | ORAL_TABLET | Freq: Four times a day (QID) | ORAL | Status: DC | PRN

## 2020-06-22 MED ORDER — POLYETHYLENE GLYCOL 3350 17 G PO PACK: 17.0000 g | PACK | Freq: Every day | ORAL | Status: DC | PRN

## 2020-06-22 MED ORDER — SODIUM CHLORIDE 0.9 % IV SOLN
500.0000 mg | INTRAVENOUS | Status: DC
  Administered 2020-06-23: 500 mg via INTRAVENOUS
  Filled 2020-06-22 (×2): qty 500

## 2020-06-22 MED ORDER — ONDANSETRON HCL 4 MG/2ML IJ SOLN
4.0000 mg | Freq: Four times a day (QID) | INTRAMUSCULAR | Status: DC | PRN
Start: 2020-06-22 — End: 2020-06-24

## 2020-06-22 MED ORDER — SODIUM CHLORIDE 0.9 % IV SOLN
2.0000 g | INTRAVENOUS | Status: DC
  Administered 2020-06-23 – 2020-06-24 (×2): 2 g via INTRAVENOUS
  Filled 2020-06-22 (×2): qty 20

## 2020-06-22 MED ORDER — HYDRALAZINE HCL 25 MG PO TABS
50.0000 mg | ORAL_TABLET | Freq: Once | ORAL | Status: AC
  Administered 2020-06-22: 50 mg via ORAL
  Filled 2020-06-22: qty 2

## 2020-06-22 MED ORDER — FUROSEMIDE 10 MG/ML IJ SOLN
60.0000 mg | Freq: Once | INTRAMUSCULAR | Status: AC
  Administered 2020-06-22: 60 mg via INTRAVENOUS
  Filled 2020-06-22: qty 8

## 2020-06-22 MED ORDER — SODIUM CHLORIDE 0.9 % IV SOLN
500.0000 mg | Freq: Once | INTRAVENOUS | Status: AC
  Administered 2020-06-22: 500 mg via INTRAVENOUS
  Filled 2020-06-22: qty 500

## 2020-06-22 MED ORDER — HEPARIN SODIUM (PORCINE) 5000 UNIT/ML IJ SOLN
5000.0000 [IU] | Freq: Three times a day (TID) | INTRAMUSCULAR | Status: DC
  Administered 2020-06-22 – 2020-06-24 (×5): 5000 [IU] via SUBCUTANEOUS
  Filled 2020-06-22 (×5): qty 1

## 2020-06-22 NOTE — Progress Notes (Signed)
  Echocardiogram 2D Echocardiogram has been performed.  Fidel Levy 06/22/2020, 4:13 PM

## 2020-06-22 NOTE — ED Notes (Signed)
Pt O2 was at 86%ra. Put pt on 2.5L [place], statting at 95% or more

## 2020-06-22 NOTE — ED Triage Notes (Signed)
Pt c/o sob  X 3 days, states he thinks he has "fluid on my lungs". Pt states he's compliant w diuretics. Pt O2 is 87% ra and is hypertensive. Pt c/o left side pain

## 2020-06-22 NOTE — ED Provider Notes (Signed)
[place] DEPT Provider Note   CSN: [id number] Arrival date & time: 06/22/20  [id number]     History Chief Complaint  Patient presents with  . Shortness of Breath    Kelon Conkin II is a 50 y.o. male with a history of congestive heart failure, diabetes, hypertensive urgency, presenting to the emergency department with shortness of breath.  The patient reports that he began to feel short of breath approximately 2 days ago.  He feels that he has fluid on his lungs.  He reports bilateral leg swelling as well.  He reports left-sided chest pressure.  He is not normally on oxygen.  He reports compliance with his diuretics and says he is on Lasix.  He has difficulty remembering the doses or names of his medications.  He did not take any of his morning medications.  Reports that shortness of breath is worse with walking and with lying down.  He said he cannot lay down at all because he feels very short of breath.  He has not had any of the COVID vaccines.  HPI     Past Medical History:  Diagnosis Date  . CHF (congestive heart failure) ([place])   . Diabetes mellitus without complication [facility])     Patient Active Problem List   Diagnosis Date Noted  . Acute respiratory failure with hypoxia ([place]) 06/22/2020  . Multifocal pneumonia 06/22/2020  . Accelerated hypertension 06/20/2019  . Elevated troponin 06/19/2019  . Acute CHF (congestive heart failure) ([place]) 06/19/2019  . Hypoalbuminemia 06/19/2019  . Hypertensive urgency 06/19/2019  . AKI (acute kidney injury) ([place]) 06/19/2019  . CHF (congestive heart failure) ([place]) 06/19/2019    Past Surgical History:  Procedure Laterality Date  . EYE SURGERY         No family history on file.  Social History   Tobacco Use  . Smoking status: Never Smoker  . Smokeless tobacco: Never Used  Substance Use Topics  . Alcohol use: Not Currently  . Drug use: Not Currently    Home Medications Prior to Admission  medications   Medication Sig Start Date End Date Taking? Authorizing Provider  amLODipine-benazepril (LOTREL) 10-20 MG capsule Take 1 capsule by mouth daily. 06/14/20  Yes [provider]  atorvastatin (LIPITOR) 40 MG tablet Take 1 tablet (40 mg total) by mouth daily. 06/22/19 09/20/19 Yes Ghimire, Dante Gang, MD  glimepiride (AMARYL) 4 MG tablet Take 4 mg by mouth daily. 06/05/20  Yes [provider]  hydrochlorothiazide (HYDRODIURIL) 25 MG tablet Take 25 mg by mouth daily. 06/05/20  Yes [provider]  JANUVIA 100 MG tablet Take 100 mg by mouth daily. 06/05/20  Yes [provider]  levocetirizine (XYZAL) 5 MG tablet Take 5 mg by mouth daily. 06/05/20  Yes [provider]  metFORMIN (GLUCOPHAGE) 1000 MG tablet Take 1 tablet (1,000 mg total) by mouth 2 (two) times daily with a meal. 06/21/19 10/19/19 Yes Ghimire, Dante Gang, MD  naproxen sodium (ALEVE) 220 MG tablet Take 440 mg by mouth 2 (two) times daily as needed (pain/headache).   Yes [provider]  omeprazole (PRILOSEC) 40 MG capsule Take 40 mg by mouth daily. 06/05/20  Yes [provider]  glipiZIDE (GLUCOTROL) 5 MG tablet Take 0.5 tablets (2.5 mg total) by mouth 2 (two) times daily before a meal. Patient not taking: Reported on 06/22/2020 06/21/19 10/19/19  Barb Merino, MD  hydrALAZINE (APRESOLINE) 25 MG tablet Take 1 tablet (25 mg total) by mouth 3 (three) times daily. Patient  not taking: Reported on 06/22/2020 06/21/19 09/19/19  Barb Merino, MD  isosorbide mononitrate (IMDUR) 30 MG 24 hr tablet Take 1 tablet (30 mg total) by mouth daily. Patient not taking: Reported on 06/22/2020 06/22/19 09/20/19  Barb Merino, MD    Allergies    Patient has no known allergies.  Review of Systems   Review of Systems  Constitutional: Negative for chills and fever.  HENT: Negative for ear pain and sore throat.   Eyes: Negative for pain and visual disturbance.  Respiratory: Positive for chest tightness and  shortness of breath.   Cardiovascular: Positive for chest pain and leg swelling. Negative for palpitations.  Gastrointestinal: Negative for abdominal pain and vomiting.  Genitourinary: Negative for dysuria and hematuria.  Musculoskeletal: Negative for arthralgias and back pain.  Skin: Negative for color change and rash.  Neurological: Negative for syncope and light-headedness.  All other systems reviewed and are negative.   Physical Exam Updated Vital Signs BP (!) 141/80 (BP Location: Right Arm)   Pulse 82   Temp 98.7 F (37.1 C) (Oral)   Resp 18   Ht '5\' 6"'$  (1.676 m)   Wt 81.7 kg   SpO2 98%   BMI 29.07 kg/m   Physical Exam Constitutional:      General: He is not in acute distress. HENT:     Head: Normocephalic and atraumatic.  Eyes:     Conjunctiva/sclera: Conjunctivae normal.     Pupils: Pupils are equal, round, and reactive to light.  Cardiovascular:     Rate and Rhythm: Normal rate and regular rhythm.  Pulmonary:     Effort: Pulmonary effort is normal.     Comments: 88% on room air, 94% on 2L [place] at rest Diffuse crackles on bilateral lung exam Abdominal:     General: There is no distension.     Tenderness: There is no abdominal tenderness.  Musculoskeletal:     Right lower leg: Edema present.     Left lower leg: Edema present.  Skin:    General: Skin is warm and dry.  Neurological:     General: No focal deficit present.     Mental Status: He is alert. Mental status is at baseline.  Psychiatric:        Mood and Affect: Mood normal.        Behavior: Behavior normal.     ED Results / Procedures / Treatments   Labs (all labs ordered are listed, but only abnormal results are displayed) Labs Reviewed  COMPREHENSIVE METABOLIC PANEL - Abnormal; Notable for the following components:      Result Value   Glucose, Bld 211 (*)    BUN 21 (*)    Creatinine, Ser 2.71 (*)    Calcium 8.0 (*)    Albumin 2.8 (*)    GFR, Estimated 28 (*)    All other components within  normal limits  CBC WITH DIFFERENTIAL/PLATELET - Abnormal; Notable for the following components:   WBC 12.2 (*)    Hemoglobin 12.5 (*)    HCT 37.4 (*)    MCV 79.9 (*)    Neutro Abs 10.5 (*)    All other components within normal limits  URINALYSIS, ROUTINE W REFLEX MICROSCOPIC - Abnormal; Notable for the following components:   Color, Urine STRAW (*)    Glucose, UA 50 (*)    Hgb urine dipstick SMALL (*)    Protein, ur 100 (*)    All other components within normal limits  BRAIN NATRIURETIC PEPTIDE - Abnormal;  Notable for the following components:   B Natriuretic Peptide 218.6 (*)    All other components within normal limits  LIPID PANEL - Abnormal; Notable for the following components:   HDL 29 (*)    LDL Cholesterol 137 (*)    All other components within normal limits  TROPONIN I (HIGH SENSITIVITY) - Abnormal; Notable for the following components:   Troponin I (High Sensitivity) 18 (*)    All other components within normal limits  RESP PANEL BY RT-PCR (FLU A&B, COVID) ARPGX2  CULTURE, BLOOD (SINGLE)  URINE CULTURE  LACTIC ACID, PLASMA  PROTIME-INR  APTT  HIV ANTIBODY (ROUTINE TESTING W REFLEX)  LEGIONELLA PNEUMOPHILA SEROGP 1 UR AG  STREP PNEUMONIAE URINARY ANTIGEN  HEMOGLOBIN [id number]  BASIC METABOLIC PANEL  CBC  TROPONIN I (HIGH SENSITIVITY)    EKG EKG Interpretation  Date/Time:  Thursday Jun 22 2020 09:18:52 EDT Ventricular Rate:  94 PR Interval:  171 QRS Duration: 84 QT Interval:  359 QTC Calculation: 449 R Axis:   97 Text Interpretation: Sinus rhythm Left atrial enlargement Borderline right axis deviation Left ventricular hypertrophy Anterior Q waves, possibly due to LVH Abnormal T, consider ischemia, inferior leads Borderline ST elevation, lateral leads Confirmed by Octaviano Glow [phone]) on 06/22/2020 9:53:57 AM   Radiology DG Chest Port 1 View  Result Date: 06/22/2020 CLINICAL DATA:  Fever and shortness of breath EXAM: PORTABLE CHEST 1 VIEW COMPARISON:  Jun 19, 2019 FINDINGS: There is airspace opacity throughout left mid and lower lung regions as well as in the inferior aspect of the left upper lobe region. Ill-defined airspace opacity also in portions of right mid and lower lung, less pronounced than on the left. Heart is upper normal in size with pulmonary vascularity normal. No adenopathy. No bone lesions. IMPRESSION: Airspace opacity at multiple sites bilaterally, more on the left than on the right. Suspect multifocal pneumonia, potentially of atypical organism etiology. Check of COVID-19 status in this regard advised. Differential considerations include hypersensitivity pneumonitis and noncardiogenic pulmonary edema. Heart upper normal in size. No adenopathy appreciable by radiography. Electronically Signed   By: Lowella Grip III M.D.   On: 06/22/2020 10:27    Procedures .Critical Care Performed by: Wyvonnia Dusky, MD Authorized by: Wyvonnia Dusky, MD   Critical care provider statement:    Critical care time (minutes):  45   Critical care was necessary to treat or prevent imminent or life-threatening deterioration of the following conditions:  Circulatory failure   Critical care was time spent personally by me on the following activities:  Discussions with consultants, evaluation of patient's response to treatment, examination of patient, ordering and performing treatments and interventions, ordering and review of laboratory studies, ordering and review of radiographic studies, pulse oximetry, re-evaluation of patient's condition, obtaining history from patient or surrogate and review of old charts     Medications Ordered in ED Medications  albuterol (VENTOLIN HFA) 108 (90 Base) MCG/ACT inhaler 2 puff (2 puffs Inhalation Given 06/22/20 0942)  isosorbide mononitrate (IMDUR) 24 hr tablet 30 mg (30 mg Oral Given 06/22/20 1141)  atorvastatin (LIPITOR) tablet 40 mg (has no administration in time range)  acetaminophen (TYLENOL) tablet 650 mg  (has no administration in time range)    Or  acetaminophen (TYLENOL) suppository 650 mg (has no administration in time range)  polyethylene glycol (MIRALAX / GLYCOLAX) packet 17 g (has no administration in time range)  cefTRIAXone (ROCEPHIN) 2 g in sodium chloride 0.9 % 100 mL IVPB (has no administration  in time range)  azithromycin (ZITHROMAX) 500 mg in sodium chloride 0.9 % 250 mL IVPB (has no administration in time range)  heparin injection 5,000 Units (has no administration in time range)  furosemide (LASIX) injection 40 mg (has no administration in time range)  ondansetron (ZOFRAN) tablet 4 mg (has no administration in time range)    Or  ondansetron (ZOFRAN) injection 4 mg (has no administration in time range)  hydrALAZINE (APRESOLINE) tablet 10 mg (has no administration in time range)  insulin aspart (novoLOG) injection 0-9 Units (has no administration in time range)  acetaminophen (TYLENOL) tablet 1,000 mg (1,000 mg Oral Given 06/22/20 0942)  furosemide (LASIX) injection 60 mg (60 mg Intravenous Given 06/22/20 1003)  hydrALAZINE (APRESOLINE) tablet 50 mg (50 mg Oral Given 06/22/20 1003)  cefTRIAXone (ROCEPHIN) 1 g in sodium chloride 0.9 % 100 mL IVPB (0 g Intravenous Stopped 06/22/20 1207)  azithromycin (ZITHROMAX) 500 mg in sodium chloride 0.9 % 250 mL IVPB (0 mg Intravenous Stopped 06/22/20 1336)    ED Course  I have reviewed the triage vital signs and the nursing notes.  Pertinent labs & imaging results that were available during my care of the patient were reviewed by me and considered in my medical decision making (see chart for details).  This patient complains of shortness of breath, chest pain.  This involves an extensive number of treatment options, and is a complaint that carries with it a high risk of complications and morbidity.  The differential diagnosis includes hypertensive urgency versus pulmonary edema versus CHF exacerbation versus COVID-19 versus pneumonia versus  other.  He is not in acute respiratory distress on arrival.  He is stable on 2 L nasal cannula.  He is not requiring BiPAP for work of breathing at this time.  We will need to monitor him closely.  Alhassan Pangburn II was evaluated in Emergency Department on 06/22/2020 for the symptoms described in the history of present illness. He was evaluated in the context of the global COVID-19 pandemic, which necessitated consideration that the patient might be at risk for infection with the SARS-CoV-2 virus that causes COVID-19. Institutional protocols and algorithms that pertain to the evaluation of patients at risk for COVID-19 are in a state of rapid change based on information released by regulatory bodies including the CDC and federal and state organizations. These policies and algorithms were followed during the patient's care in the ED.   I ordered, reviewed, and interpreted labs, including I ordered medication IV lasix for pulm edema, PO hydralazine and imdur for BP, IV antibiotics for suspected CAP I ordered imaging studies which included dg chest I independently visualized and interpreted imaging which showed bilateral infiltrates vs pulmonary edema, and the monitor tracing which showed sinus rhythm Previous records obtained and reviewed showing hospitalizaiton 1 year ago for hypertensive urgency, last echo 06/19/2019 with EF 60-65%, severe LVH.  Clinically doubt sepsis at this time.  Lactate normal.  BP stable.    Clinical Course as of 06/22/20 1726  Thu Jun 22, 2020  1106 Troponin I (High Sensitivity): 16 [MT]  1106 Influenza A By PCR: NEGATIVE [MT]  1106 SARS Coronavirus 2 by RT PCR: NEGATIVE [MT]  1106 COVID is negative.  Have ordered antibiotics for community pneumonia.  Rocephin and azithromycin.  Have also ordered Tylenol.  His lactate is normal.  I would avoid additional fluids at this time as he clearly has pulmonary edema [MT]  1229 Patient reports improvement of his chest pressure  with improvement  of the blood pressure.  However he will need admission for community-acquired pneumonia and likely pulmonary edema, suspect he may need repeat ECHO in setting of his suspected significant LVH to evaluate for diastolic heart failure. [MT]    Clinical Course User Index [MT] Tayona Sarnowski, Carola Rhine, MD    Final Clinical Impression(s) / ED Diagnoses Final diagnoses:  Community acquired pneumonia, unspecified laterality  Hypertensive urgency  Acute pulmonary edema [facility])    Rx / DC Orders ED Discharge Orders    None       Wyvonnia Dusky, MD 06/22/20 1727

## 2020-06-22 NOTE — H&P (Signed)
History and Physical        Hospital Admission Note Date: 06/22/2020  Patient name: Timothy [name] Medical record number: [id number] Date of birth: 07-07-1970 Age: 50 y.o. Gender: male  PCP: Patient, No Pcp Per (Inactive)   Chief Complaint    Chief Complaint  Patient presents with  . Shortness of Breath      HPI:   This is a 50 year old male who has not been vaccinated against COVID-19 with past medical history of chronic diastolic CHF, hypertension, diabetes who presented to the ED with shortness of breath for 2 days.  States that he was in his usual state of health until 2 days ago when he felt like he had fluid on his lungs and dyspneic with exertion.  Also shortness of breath is worse laying flat, notably at night.  States that he occasionally does not take his medications as they make him dizzy but has been taking them over the past several days after realizing that he had not been feeling well.  Says that since coming to the hospital he feels better overall but does feel like the room is spinning since he received IV medications.  Says that he noticed that his left lower extremity started swelling which is atypical for his CHF exacerbations.  Did not have a fever until this morning.  No recent travel or sick contacts.  No history of blood clots, tobacco use or alcohol use.  Does admit to high salt intake including fast food daily and eating out frequently.  Tolerates room air at baseline.   ED Course: Febrile (101.6 F) tachypneic, hypertensive (208/119, improved with home BP meds), SpO2 86% on room air improved to 95% with 2 L/min. Notable Labs: Sodium 136, K4.4, glucose 211, BUN 21, creatinine 2.71, BNP 218, troponin 16, lactic acid 1.4, WBC 12.2, COVID-19 and flu negative otherwise labs unremarkable. Notable Imaging: CXR- multifocal pneumonia (L>R) potentially atypical  organism etiology, differential diagnosis include hypersensitivity pneumonitis noncardiogenic pulmonary edema. Patient received Tylenol, Lasix 60 mg IV x1, hydralazine 50 mg x 1, Imdur, ceftriaxone and azithromycin and albuterol.    Vitals:   06/22/20 1200 06/22/20 1230  BP: (!) 143/87 110/71  Pulse: 88 86  Resp: 16 (!) 22  Temp:    SpO2: 98% 95%     Review of Systems:  Review of Systems  Gastrointestinal: Positive for nausea and vomiting.  All other systems reviewed and are negative.   Medical/Social/Family History   Past Medical History: Past Medical History:  Diagnosis Date  . CHF (congestive heart failure) ([place])   . Diabetes mellitus without complication [facility])     Past Surgical History:  Procedure Laterality Date  . EYE SURGERY      Medications: Prior to Admission medications   Medication Sig Start Date End Date Taking? Authorizing Provider  atorvastatin (LIPITOR) 40 MG tablet Take 1 tablet (40 mg total) by mouth daily. 06/22/19 09/20/19  Barb Merino, MD  furosemide (LASIX) 20 MG tablet Take 1 tablet (20 mg total) by mouth daily. 06/21/19 06/20/20  Barb Merino, MD  glipiZIDE (GLUCOTROL) 5 MG tablet Take 0.5 tablets (2.5 mg total) by mouth 2 (two) times daily before a meal. 06/21/19 10/19/19  Ghimire,  Dante Gang, MD  hydrALAZINE (APRESOLINE) 25 MG tablet Take 1 tablet (25 mg total) by mouth 3 (three) times daily. 06/21/19 09/19/19  Barb Merino, MD  isosorbide mononitrate (IMDUR) 30 MG 24 hr tablet Take 1 tablet (30 mg total) by mouth daily. 06/22/19 09/20/19  Barb Merino, MD  metFORMIN (GLUCOPHAGE) 1000 MG tablet Take 1 tablet (1,000 mg total) by mouth 2 (two) times daily with a meal. 06/21/19 10/19/19  Barb Merino, MD  potassium chloride 20 MEQ TBCR Take 10 mEq by mouth daily. 06/21/19 09/19/19  Barb Merino, MD    Allergies:  No Known Allergies  Social History:  reports that he has never smoked. He has never used smokeless tobacco. He reports previous alcohol use.  He reports previous drug use.  Family History: No family history on file.   Objective   Physical Exam: Blood pressure 110/71, pulse 86, temperature (!) 101.6 F (38.7 C), temperature source Oral, resp. rate (!) 22, height '5\' 6"'$  (1.676 m), weight 74.8 kg, SpO2 95 %.  Physical Exam Vitals and nursing note reviewed.  Constitutional:      Appearance: Normal appearance.  HENT:     Head: Normocephalic and atraumatic.  Eyes:     Conjunctiva/sclera: Conjunctivae normal.  Cardiovascular:     Rate and Rhythm: Normal rate and regular rhythm.  Pulmonary:     Effort: Pulmonary effort is normal.     Breath sounds: Examination of the left-middle field reveals rales. Examination of the right-lower field reveals rales. Examination of the left-lower field reveals rales. Rales present. No wheezing.  Abdominal:     General: Abdomen is flat.     Palpations: Abdomen is soft.  Musculoskeletal:        General: No swelling or tenderness.     Right lower leg: Edema present.     Left lower leg: Edema present.     Comments: 1+ bilateral lower pitting extremity edema  Skin:    Coloration: Skin is not jaundiced or pale.  Neurological:     Mental Status: He is alert. Mental status is at baseline.  Psychiatric:        Mood and Affect: Mood normal.        Behavior: Behavior normal.     LABS on Admission: I have personally reviewed all the labs and imaging below    Basic Metabolic Panel: Recent Labs  Lab 06/22/20 1012  NA 136  K 4.4  CL 108  CO2 22  GLUCOSE 211*  BUN 21*  CREATININE 2.71*  CALCIUM 8.0*   Liver Function Tests: Recent Labs  Lab 06/22/20 1012  AST 17  ALT 12  ALKPHOS 66  BILITOT 0.6  PROT 7.0  ALBUMIN 2.8*   No results for input(s): LIPASE, AMYLASE in the last 168 hours. No results for input(s): AMMONIA in the last 168 hours. CBC: Recent Labs  Lab 06/22/20 1012  WBC 12.2*  NEUTROABS 10.5*  HGB 12.5*  HCT 37.4*  MCV 79.9*  PLT 282   Cardiac Enzymes: No  results for input(s): CKTOTAL, CKMB, CKMBINDEX, TROPONINI in the last 168 hours. BNP: Invalid input(s): POCBNP CBG: No results for input(s): GLUCAP in the last 168 hours.  Radiological Exams on Admission:  DG Chest Port 1 View  Result Date: 06/22/2020 CLINICAL DATA:  Fever and shortness of breath EXAM: PORTABLE CHEST 1 VIEW COMPARISON:  Jun 19, 2019 FINDINGS: There is airspace opacity throughout left mid and lower lung regions as well as in the inferior aspect of the left upper lobe  region. Ill-defined airspace opacity also in portions of right mid and lower lung, less pronounced than on the left. Heart is upper normal in size with pulmonary vascularity normal. No adenopathy. No bone lesions. IMPRESSION: Airspace opacity at multiple sites bilaterally, more on the left than on the right. Suspect multifocal pneumonia, potentially of atypical organism etiology. Check of COVID-19 status in this regard advised. Differential considerations include hypersensitivity pneumonitis and noncardiogenic pulmonary edema. Heart upper normal in size. No adenopathy appreciable by radiography. Electronically Signed   By: Lowella Grip III M.D.   On: 06/22/2020 10:27      EKG: right axis deviation, sinus rhythm, Q waves V1 and V2, LVH, T wave inversions in V6, [name], III, aVF similar to prior EKGs   A & P   Principal Problem:   Acute respiratory failure with hypoxia (HCC) Active Problems:   Acute CHF (congestive heart failure) (HCC)   Hypertensive urgency   AKI (acute kidney injury) ([place])   Multifocal pneumonia   1. Acute hypoxic respiratory failure, multifactorial: Multifocal pneumonia and suspected CHF exacerbation a. Treatment as outlined below  2. Community-acquired multifocal pneumonia a. Continue ceftriaxone and azithromycin b. Check strep and Legionella urine Ag given atypical CXR   3. Suspected CHF exacerbation, possibly from hypertensive crisis a. Echo 06/19/2019: EF 60 to 65% with severe  LVH b. BP 208/119 on presentation and noncompliant with meds at home as well as high salt intake daily - advised to adhere to a low-salt diet c. BNP mildly elevated 218, bilateral lower extremity edema on exam d. Repeat echo e. Daily weights and intake/output f. Lasix 40 mg IV twice daily g. Low-sodium diet h. Continue Imdur and decrease hydralazine due to dizziness as below  4. Elevated creatinine, suspect developing AKI on CKD 3a a. Creatinine 2.7, previously 1.79 one year ago b. Likely secondary to hemodynamic changes and possibly cardiorenal c. Continue with diuresis  5. Diabetes a. Check HbA1c b. Sensitive sliding scale given renal dysfunction  6. Dizziness a. Likely secondary to rapid BP changes in the setting of likely chronic severely elevated BP b. Hold off on aggressive BP management as he likely runs high    DVT prophylaxis: Heparin   Code Status: Full Code  Diet: Low-sodium Family Communication: Admission, patients condition and plan of care including tests being ordered have been discussed with the patient who indicates understanding and agrees with the plan and Code Status.   Disposition Plan: The appropriate patient status for this patient is INPATIENT. Inpatient status is judged to be reasonable and necessary in order to provide the required intensity of service to ensure the patient's safety. The patient's presenting symptoms, physical exam findings, and initial radiographic and laboratory data in the context of their chronic comorbidities is felt to place them at high risk for further clinical deterioration. Furthermore, it is not anticipated that the patient will be medically stable for discharge from the hospital within 2 midnights of admission. The following factors support the patient status of inpatient.   " The patient's presenting symptoms include shortness of breath. " The worrisome physical exam findings include lower extremity edema. " The initial  radiographic and laboratory data are worrisome because of leukocytosis, fever and abnormal testicular. " The chronic co-morbidities include heart failure, dietary and medication noncompliance.   * I certify that at the point of admission it is my clinical judgment that the patient will require inpatient hospital care spanning beyond 2 midnights from the point of admission due to high  intensity of service, high risk for further deterioration and high frequency of surveillance required.*   Consultants  . None  Procedures  . None  Time Spent on Admission: 62 minutes    Harold Hedge, DO [facility]  06/22/2020, 1:53 PM

## 2020-06-23 ENCOUNTER — Inpatient Hospital Stay (HOSPITAL_COMMUNITY): Payer: Self-pay

## 2020-06-23 DIAGNOSIS — N1831 Chronic kidney disease, stage 3a: Secondary | ICD-10-CM

## 2020-06-23 DIAGNOSIS — I5033 Acute on chronic diastolic (congestive) heart failure: Secondary | ICD-10-CM

## 2020-06-23 LAB — URINE CULTURE: Culture: NO GROWTH

## 2020-06-23 LAB — BASIC METABOLIC PANEL
Anion gap: 4 — ABNORMAL LOW (ref 5–15)
BUN: 26 mg/dL — ABNORMAL HIGH (ref 6–20)
CO2: 27 mmol/L (ref 22–32)
Calcium: 7.9 mg/dL — ABNORMAL LOW (ref 8.9–10.3)
Chloride: 106 mmol/L (ref 98–111)
Creatinine, Ser: 2.94 mg/dL — ABNORMAL HIGH (ref 0.61–1.24)
GFR, Estimated: 25 mL/min — ABNORMAL LOW (ref >60)
Glucose, Bld: 201 mg/dL — ABNORMAL HIGH (ref 70–99)
Potassium: 4.3 mmol/L (ref 3.5–5.1)
Sodium: 137 mmol/L (ref 135–145)

## 2020-06-23 LAB — CBC
HCT: 33.6 % — ABNORMAL LOW (ref 39.0–52.0)
Hemoglobin: 11.2 g/dL — ABNORMAL LOW (ref 13.0–17.0)
MCH: 26.9 pg (ref 26.0–34.0)
MCHC: 33.3 g/dL (ref 30.0–36.0)
MCV: 80.6 fL (ref 80.0–100.0)
Platelets: 265 10*3/uL (ref 150–400)
RBC: 4.17 MIL/uL — ABNORMAL LOW (ref 4.22–5.81)
RDW: 13.4 % (ref 11.5–15.5)
WBC: 7.9 10*3/uL (ref 4.0–10.5)
nRBC: 0 % (ref 0.0–0.2)

## 2020-06-23 LAB — GLUCOSE, CAPILLARY
Glucose-Capillary: 212 mg/dL — ABNORMAL HIGH (ref 70–99)
Glucose-Capillary: 268 mg/dL — ABNORMAL HIGH (ref 70–99)
Glucose-Capillary: 193 mg/dL — ABNORMAL HIGH (ref 70–99)
Glucose-Capillary: 238 mg/dL — ABNORMAL HIGH (ref 70–99)

## 2020-06-23 LAB — STREP PNEUMONIAE URINARY ANTIGEN: Strep Pneumo Urinary Antigen: NEGATIVE

## 2020-06-23 MED ORDER — LIVING WELL WITH DIABETES BOOK
Freq: Once | Status: AC
  Filled 2020-06-23: qty 1

## 2020-06-23 NOTE — Progress Notes (Signed)
Nutrition Note  RD consulted for nutrition education regarding diabetes.   Lab Results  Component Value Date   HGBA1C 12.0 (H) 06/22/2020    RD provided "Carbohydrate Counting for People with Diabetes" handout from the Academy of Nutrition and Dietetics. Discussed different food groups and their effects on blood sugar, emphasizing carbohydrate-containing foods. Provided list of carbohydrates and recommended serving sizes of common foods.  Discussed importance of controlled and consistent carbohydrate intake throughout the day. Provided examples of ways to balance meals/snacks and encouraged intake of high-fiber, whole grain complex carbohydrates. Teach back method used.  Expect fair compliance. Pt did not have many questions for RD at this time.  Body mass index is 28.18 kg/m. Pt meets criteria for overweight based on current BMI.  Current diet order is 2GMNA, patient is consuming approximately 100% of meals at this time. Labs and medications reviewed. No further nutrition interventions warranted at this time.  If additional nutrition issues arise, please re-consult RD.  Clayton Bibles, MS, RD, LDN Inpatient Clinical Dietitian Contact information available via Amion

## 2020-06-23 NOTE — Progress Notes (Addendum)
Inpatient Diabetes Program Recommendations  AACE/ADA: New Consensus Statement on Inpatient Glycemic Control (2015)  Target Ranges:  Prepandial:   less than 140 mg/dL      Peak postprandial:   less than 180 mg/dL (1-2 hours)      Critically ill patients:  140 - 180 mg/dL   Results for Timothy [name], Timothy [name] (MRN [id number]) as of 06/23/2020 12:30  Ref. Range 06/22/2020 21:03 06/23/2020 07:54 06/23/2020 11:42  Glucose-Capillary Latest Ref Range: 70 - 99 mg/dL 204 (H) 212 (H)  3 units NOVOLOG 268 (H)  5 units NOVOLOG     Results for Timothy [name], Timothy [name] (MRN [id number]) as of 06/23/2020 08:50  Ref. Range 06/19/2019 14:50 06/22/2020 15:31  Hemoglobin A1C Latest Ref Range: 4.8 - 5.6 % 9.9 (H) 12.0 (H)  (297 mg/dl)    Admit with: Acute hypoxic respiratory failure, multifactorial: Multifocal pneumonia and suspected CHF exacerbation  History: DM, CHF  Home DM Meds: Amaryl 4 mg Daily       Glipizide 2.5 mg BID (NOT taking)       Metformin 1000 mg BID       Januvia 100 mg Daily  Current Orders: Novolog Sensitive Correction Scale/ SSI (0-9 units) TID AC   No insurance listed No PCP listed   Current A1c of 12% shows very poor glucose control at home   MD- Pt open to adding another oral diabetes medication to regimen and also open to adding insulin if needed but is concerned about cost.  We could try to add Amaryl (glimeperide) 4 mg Daily--Pt can get at [facility] for $4  We could also forgo the Amaryl and start 70/30 Insulin for home.  Pt can get 70/30 Insulin pens at [facility] for $45 (would get 5 pens each containing 500 units)  Could start with 70/30 Insulin 10 units BID--This would provide pt with total of 14 units longer-acting insulin split throughout the day (0.2 units/kg) and 3 units quicker insulin BID with meals     Addendum 11:45am-- Met w/ pt at bedside this afternoon.   Pt told me he is currently uninsured and does not have PCP.  Trying to get Social Security disability.   Only taking Metformin at home BID and only checking CBGs when he has a headache.  Stated to me when he has a headache and checks his CBGs they are usually in the 300 range.  Has CBG meter from [place] and gets supplies and Metformin there.  Unsure why Januvia, Amaryl, and Glipizide meds are listed in his home med rec.  Have placed [facility] consult for this pt to get help for pt finding affordable care after d/c.    Spoke with patient about his current A1c of 12%.  Explained what an A1c is and what it measures.  Reminded patient that his goal A1c is 7% or less per ADA standards to prevent both acute and long-term complications.  Explained to patient the extreme importance of good glucose control at home.  Encouraged patient to check his CBGs at least bid at home (fasting and another check within the day) and to record all CBGs in a logbook for his PCP to review.  Also reviewed CBG goals for home.  Also discussed DM diet information with patient.  Encouraged patient to avoid beverages with sugar (regular soda, sweet tea, lemonade, fruit juice) and to consume mostly water.  Discussed what foods contain carbohydrates and how carbohydrates affect the body's blood sugar levels.  Encouraged patient to be careful  with his portion sizes (especially grains, starchy vegetables, and fruits).  Explained to patient that men should have 60-75 grams of carbohydrates per meal per day.  Have ordered RD consult to also further assist pt with dietary choices at home.    Also discussed w/ pt the possibility that he may need either another oral DM med or may need to start insulin at home.  Pt agreeable to either option as long as affordable.  Educated patient on insulin pen use at home.  Reviewed all steps of insulin pen including attachment of needle, 2-unit air shot, dialing up dose, giving injection, rotation of injection sites, removing needle, disposal of sharps, storage of unused insulin, disposal of insulin etc.  Patient able to  provide successful return demonstration.  Reviewed troubleshooting with insulin pen.  Also reviewed Signs/Symptoms of Hypoglycemia with patient and how to treat Hypoglycemia at home.      --Will follow patient during hospitalization--  Wyn Quaker RN, MSN, CDE Diabetes Coordinator Inpatient Glycemic Control Team Team Pager: [phone] (8a-5p)

## 2020-06-23 NOTE — Progress Notes (Signed)
Patient ID: Timothy [name], male   DOB: 03-Jun-1970, 50 y.o.   MRN: [id number]  PROGRESS NOTE    Timothy [name]  [id number] DOB: 1970/07/17 DOA: 06/22/2020 PCP: Patient, No Pcp Per (Inactive)   Brief Narrative:  50 year old male who has not been vaccinated against [id number], chronic diastolic CHF currently not on diuretics and does not follow-up with a PCP or cardiologist, hypertension, diabetes mellitus type 2 presented with worsening shortness of breath along with lower extremity swelling and dizziness.  On presentation he was febrile to 101.6 F, tachypneic, hypertensive with saturations of 86% on room air and 95% with 2 L nasal cannula oxygen.  Creatinine was 2.71, troponin of 16, WBC 12.2.  COVID-19 and flu were negative.  Chest x-ray showed multifocal pneumonia with concerns for hypersensitivity pneumonitis versus noncardiogenic pulmonary edema.  He was started on IV antibiotics and Lasix.  Assessment & Plan:   Acute hypoxic respiratory failure -For pneumonia and CHF exacerbation.  Still on 2 L oxygen via nasal cannula.  Wean off as able  Multifocal pneumonia -COVID-19 and influenza were negative.  Continue Rocephin and Zithromax.  Probable acute on chronic diastolic heart failure -Patient was admitted in May 2021 with acute diastolic heart failure but currently is not on he does not have a PCP or a cardiologist. -Echo shows EF of 60 to 65% with indeterminate left ventricular diastolic parameters -Currently on Lasix 40 mg IV every 12 hours.  Decreased to 40 mg IV daily because of worsening renal function. -Strict input and output.  Daily weights.  Fluid restriction.  Comply with diet.  Continue Imdur and hydralazine  AKI on CKD stage IIIa -Creatinine 1.79 a year ago.Seen creatinine 2.71 on presentation.  Worsening to 2.94.  Diuretic plan as above.  Renal ultrasound  Leukocytosis -Resolved  Anemia of chronic disease  -Probably from renal failure.  Hemoglobin  stable  Diabetes mellitus type 2 with hyperglycemia -A1c 12.  Continue CBGs with SSI  Hyperlipidemia -Continue statin   DVT prophylaxis: Heparin subcutaneous Code Status: Full Family Communication: None at bedside Disposition Plan: Status is: Inpatient  Remains inpatient appropriate because:Inpatient level of care appropriate due to severity of illness   Dispo: The patient is from: Home              Anticipated d/c is to: Home              Patient currently is not medically stable to d/c.   Difficult to place patient No  Consultants: None  Procedures: Echo as below  Antimicrobials: None   Subjective: Patient seen and examined at bedside.  Feels slightly better.  Neck swelling is improving.  Still intermittently short of breath and coughing.  No overnight fever or vomiting reported.  Objective: Vitals:   06/22/20 1502 06/22/20 1800 06/22/20 2002 06/23/20 0447  BP: (!) 141/80  125/64 (!) 153/97  Pulse: 82  91 77  Resp: '18  17 18  '$ Temp: 98.7 F (37.1 C)  99.2 F (37.3 C) 98.8 F (37.1 C)  TempSrc: Oral  Oral Oral  SpO2: 98% 96% 95% 94%  Weight: 81.7 kg   79.2 kg  Height: '5\' 6"'$  (1.676 m)       Intake/Output Summary (Last 24 hours) at 06/23/2020 1050 Last data filed at 06/23/2020 0900 Gross per 24 hour  Intake 270 ml  Output 600 ml  Net -330 ml   Filed Weights   06/22/20 0918 06/22/20 1502 06/23/20 0447  Weight: 74.8 kg 81.7  kg 79.2 kg    Examination:  General exam: Appears calm and comfortable.  Currently on 2 L oxygen via nasal cannula Respiratory system: Bilateral decreased breath sounds at bases with some scattered crackles Cardiovascular system: S1 & S2 heard, Rate controlled Gastrointestinal system: Abdomen is nondistended, soft and nontender. Normal bowel sounds heard. Extremities: No cyanosis, clubbing; trace lower extremity edema present  Central nervous system: Alert and oriented. No focal neurological deficits. Moving extremities Skin: No  rashes, lesions or ulcers Psychiatry: Flat affect   Data Reviewed: I have personally reviewed following labs and imaging studies  CBC: Recent Labs  Lab 06/22/20 1012 06/23/20 0441  WBC 12.2* 7.9  NEUTROABS 10.5*  --   HGB 12.5* 11.2*  HCT 37.4* 33.6*  MCV 79.9* 80.6  PLT 282 [id number]   Basic Metabolic Panel: Recent Labs  Lab 06/22/20 1012 06/23/20 0441  NA 136 137  K 4.4 4.3  CL 108 106  CO2 22 27  GLUCOSE 211* 201*  BUN 21* 26*  CREATININE 2.71* 2.94*  CALCIUM 8.0* 7.9*   GFR: Estimated Creatinine Clearance: 30.1 mL/min (A) (by C-G formula based on SCr of 2.94 mg/dL (H)). Liver Function Tests: Recent Labs  Lab 06/22/20 1012  AST 17  ALT 12  ALKPHOS 66  BILITOT 0.6  PROT 7.0  ALBUMIN 2.8*   No results for input(s): LIPASE, AMYLASE in the last 168 hours. No results for input(s): AMMONIA in the last 168 hours. Coagulation Profile: Recent Labs  Lab 06/22/20 1012  INR 1.0   Cardiac Enzymes: No results for input(s): CKTOTAL, CKMB, CKMBINDEX, TROPONINI in the last 168 hours. BNP (last 3 results) No results for input(s): PROBNP in the last 8760 hours. HbA1C: Recent Labs    06/22/20 1531  HGBA1C 12.0*   CBG: Recent Labs  Lab 06/22/20 2103 06/23/20 0754  GLUCAP 204* 212*   Lipid Profile: Recent Labs    06/22/20 1410  CHOL 185  HDL 29*  LDLCALC 137*  TRIG 97  CHOLHDL 6.4   Thyroid Function Tests: No results for input(s): TSH, T4TOTAL, FREET4, T3FREE, THYROIDAB in the last 72 hours. Anemia Panel: No results for input(s): VITAMINB12, FOLATE, FERRITIN, TIBC, IRON, RETICCTPCT in the last 72 hours. Sepsis Labs: Recent Labs  Lab 06/22/20 1012  LATICACIDVEN 1.4    Recent Results (from the past 240 hour(s))  Blood culture (routine single)     Status: None (Preliminary result)   Collection Time: 06/22/20 10:12 AM   Specimen: BLOOD  Result Value Ref Range Status   Specimen Description   Final    BLOOD LEFT ANTECUBITAL Performed at [facility] [address], [place], [place] [address]    Special Requests   Final    BOTTLES DRAWN AEROBIC AND ANAEROBIC Blood Culture adequate volume Performed at [facility] [address], [place], [place] [address]    Culture   Final    NO GROWTH < 24 HOURS Performed at [facility] [address] [place], [place] [address]    Report Status PENDING  Incomplete  Resp Panel by RT-PCR (Flu A&B, Covid) Nasopharyngeal Swab     Status: None   Collection Time: 06/22/20 10:12 AM   Specimen: Nasopharyngeal Swab; Nasopharyngeal(NP) swabs in vial transport medium  Result Value Ref Range Status   SARS Coronavirus 2 by RT PCR NEGATIVE NEGATIVE Final    Comment: (NOTE) SARS-CoV-2 target nucleic acids are NOT DETECTED.  The SARS-CoV-2 RNA is generally detectable in upper respiratory  specimens during the acute phase of infection. The lowest concentration of SARS-CoV-2 viral copies this assay can detect is 138 copies/mL. A negative result does not preclude SARS-Cov-2 infection and should not be used as the sole basis for treatment or other patient management decisions. A negative result may occur with  improper specimen collection/handling, submission of specimen other than nasopharyngeal swab, presence of viral mutation(s) within the areas targeted by this assay, and inadequate number of viral copies(<138 copies/mL). A negative result must be combined with clinical observations, patient history, and epidemiological information. The expected result is Negative.  Fact Sheet for Patients:  EntrepreneurPulse.com.au  Fact Sheet for Healthcare Providers:  IncredibleEmployment.be  This test is no t yet approved or cleared by the [place] FDA and  has been authorized for detection and/or diagnosis of SARS-CoV-2 by FDA under an Emergency Use Authorization (EUA). This EUA will remain  in effect (meaning this test can be  used) for the duration of the COVID-19 declaration under Section 564(b)(1) of the Act, 21 U.S.C.section 360bbb-3(b)(1), unless the authorization is terminated  or revoked sooner.       Influenza A by PCR NEGATIVE NEGATIVE Final   Influenza B by PCR NEGATIVE NEGATIVE Final    Comment: (NOTE) The Xpert Xpress SARS-CoV-2/FLU/RSV plus assay is intended as an aid in the diagnosis of influenza from Nasopharyngeal swab specimens and should not be used as a sole basis for treatment. Nasal washings and aspirates are unacceptable for Xpert Xpress SARS-CoV-2/FLU/RSV testing.  Fact Sheet for Patients: EntrepreneurPulse.com.au  Fact Sheet for Healthcare Providers: IncredibleEmployment.be  This test is not yet approved or cleared by the [place] FDA and has been authorized for detection and/or diagnosis of SARS-CoV-2 by FDA under an Emergency Use Authorization (EUA). This EUA will remain in effect (meaning this test can be used) for the duration of the COVID-19 declaration under Section 564(b)(1) of the Act, 21 U.S.C. section 360bbb-3(b)(1), unless the authorization is terminated or revoked.  Performed at [facility], [place] [address], [place], [place] [address]          Radiology Studies: DG Chest Port 1 View  Result Date: 06/22/2020 CLINICAL DATA:  Fever and shortness of breath EXAM: PORTABLE CHEST 1 VIEW COMPARISON:  Jun 19, 2019 FINDINGS: There is airspace opacity throughout left mid and lower lung regions as well as in the inferior aspect of the left upper lobe region. Ill-defined airspace opacity also in portions of right mid and lower lung, less pronounced than on the left. Heart is upper normal in size with pulmonary vascularity normal. No adenopathy. No bone lesions. IMPRESSION: Airspace opacity at multiple sites bilaterally, more on the left than on the right. Suspect multifocal pneumonia, potentially of atypical organism  etiology. Check of COVID-19 status in this regard advised. Differential considerations include hypersensitivity pneumonitis and noncardiogenic pulmonary edema. Heart upper normal in size. No adenopathy appreciable by radiography. Electronically Signed   By: Lowella Grip III M.D.   On: 06/22/2020 10:27   ECHOCARDIOGRAM COMPLETE  Result Date: 06/22/2020    ECHOCARDIOGRAM REPORT   Patient Name:   Timothy [name] Date of Exam: 06/22/2020 Medical Rec #:  [id number]             Height:       66.0 in Accession #:    [id number]            Weight:       180.1 lb Date of Birth:  27-Apr-1970  BSA:          1.913 m Patient Age:    12 years              BP:           141/80 mmHg Patient Gender: M                     HR:           84 bpm. Exam Location:  Inpatient Procedure: 2D Echo, Cardiac Doppler and Color Doppler Indications:    CHF-Acute Diastolic [id number]  History:        Patient has prior history of Echocardiogram examinations, most                 recent 06/19/2019. CHF; Risk Factors:Diabetes.  Sonographer:    Bernadene Person RDCS Referring Phys: [id number] [place]  1. Left ventricular ejection fraction, by estimation, is 60 to 65%. The left ventricle has normal function. The left ventricle has no regional wall motion abnormalities. There is moderate left ventricular hypertrophy. Left ventricular diastolic parameters are indeterminate.  2. Right ventricular systolic function is normal. The right ventricular size is normal. There is normal pulmonary artery systolic pressure. The estimated right ventricular systolic pressure is 99991111 mmHg.  3. Left atrial size was mildly dilated.  4. The mitral valve is normal in structure. Trivial mitral valve regurgitation. No evidence of mitral stenosis.  5. The aortic valve is tricuspid. Aortic valve regurgitation is trivial. No aortic stenosis is present.  6. The inferior vena cava is normal in size with greater than 50% respiratory variability,  suggesting right atrial pressure of 3 mmHg. FINDINGS  Left Ventricle: Left ventricular ejection fraction, by estimation, is 60 to 65%. The left ventricle has normal function. The left ventricle has no regional wall motion abnormalities. The left ventricular internal cavity size was normal in size. There is  moderate left ventricular hypertrophy. Left ventricular diastolic parameters are indeterminate. Right Ventricle: The right ventricular size is normal. No increase in right ventricular wall thickness. Right ventricular systolic function is normal. There is normal pulmonary artery systolic pressure. The tricuspid regurgitant velocity is 2.35 m/s, and  with an assumed right atrial pressure of 3 mmHg, the estimated right ventricular systolic pressure is 99991111 mmHg. Left Atrium: Left atrial size was mildly dilated. Right Atrium: Right atrial size was normal in size. Pericardium: There is no evidence of pericardial effusion. Mitral Valve: The mitral valve is normal in structure. Trivial mitral valve regurgitation. No evidence of mitral valve stenosis. Tricuspid Valve: The tricuspid valve is normal in structure. Tricuspid valve regurgitation is trivial. No evidence of tricuspid stenosis. Aortic Valve: The aortic valve is tricuspid. Aortic valve regurgitation is trivial. No aortic stenosis is present. Pulmonic Valve: The pulmonic valve was normal in structure. Pulmonic valve regurgitation is trivial. No evidence of pulmonic stenosis. Aorta: The aortic root is normal in size and structure. Venous: The inferior vena cava is normal in size with greater than 50% respiratory variability, suggesting right atrial pressure of 3 mmHg. IAS/Shunts: The interatrial septum was not well visualized.  LEFT VENTRICLE PLAX 2D LVIDd:         4.30 cm  Diastology LVIDs:         2.90 cm  LV e' medial:    6.02 cm/s LV PW:         1.55 cm  LV E/e' medial:  11.7 LV IVS:  1.60 cm  LV e' lateral:   7.20 cm/s LVOT diam:     2.00 cm  LV E/e'  lateral: 9.8 LV SV:         62 LV SV Index:   32 LVOT Area:     3.14 cm  RIGHT VENTRICLE RV S prime:     17.40 cm/s TAPSE (M-mode): 2.8 cm LEFT ATRIUM             Index       RIGHT ATRIUM           Index LA diam:        4.10 cm 2.14 cm/m  RA Area:     13.80 cm LA Vol (A2C):   62.1 ml 32.47 ml/m RA Volume:   30.30 ml  15.84 ml/m LA Vol (A4C):   63.9 ml 33.41 ml/m LA Biplane Vol: 68.0 ml 35.55 ml/m  AORTIC VALVE LVOT Vmax:   102.00 cm/s LVOT Vmean:  77.500 cm/s LVOT VTI:    0.196 m  AORTA Ao Root diam: 3.70 cm Ao Asc diam:  3.60 cm MITRAL VALVE               TRICUSPID VALVE MV Area (PHT): 4.77 cm    TR Peak grad:   22.1 mmHg MV Decel Time: 159 msec    TR Vmax:        235.00 cm/s MV E velocity: 70.60 cm/s MV A velocity: 53.30 cm/s  SHUNTS MV E/A ratio:  1.32        Systemic VTI:  0.20 m                            Systemic Diam: 2.00 cm Cherlynn Kaiser MD Electronically signed by Cherlynn Kaiser MD Signature Date/Time: 06/22/2020/5:44:02 PM    Final         Scheduled Meds: . atorvastatin  40 mg Oral q1800  . furosemide  40 mg Intravenous BID  . heparin  5,000 Units Subcutaneous Q8H  . hydrALAZINE  10 mg Oral TID  . insulin aspart  0-9 Units Subcutaneous TID WC  . isosorbide mononitrate  30 mg Oral Daily   Continuous Infusions: . azithromycin    . cefTRIAXone (ROCEPHIN)  IV 2 g (06/23/20 1022)          Aline August, MD [facility] 06/23/2020, 10:50 AM

## 2020-06-23 NOTE — TOC Progression Note (Signed)
Transition of Care [facility]) - Progression Note    Patient Details  Name: Timothy [name] MRN: [id number] Date of Birth: 17-Mar-1970  Transition of Care [facility]) CM/SW [place], [place] Phone Number: 06/23/2020, 3:10 PM  Clinical Narrative:   TOC responding to MD consult for PCP need.  Secured patient an appointment with [facility] patient care [facility].  Unfortunately, they had nothing until September.  He is on their cancellation list. TOC will continue to follow during the course of hospitalization.       Barriers to Discharge: No Barriers Identified  Expected Discharge Plan and Services                                                 Social Determinants of Health (SDOH) Interventions    Readmission Risk Interventions No flowsheet data found.

## 2020-06-24 LAB — BASIC METABOLIC PANEL
Anion gap: 8 (ref 5–15)
BUN: 25 mg/dL — ABNORMAL HIGH (ref 6–20)
CO2: 29 mmol/L (ref 22–32)
Calcium: 8.7 mg/dL — ABNORMAL LOW (ref 8.9–10.3)
Chloride: 100 mmol/L (ref 98–111)
Creatinine, Ser: 2.73 mg/dL — ABNORMAL HIGH (ref 0.61–1.24)
GFR, Estimated: 28 mL/min — ABNORMAL LOW (ref >60)
Glucose, Bld: 251 mg/dL — ABNORMAL HIGH (ref 70–99)
Potassium: 4.3 mmol/L (ref 3.5–5.1)
Sodium: 137 mmol/L (ref 135–145)

## 2020-06-24 LAB — GLUCOSE, CAPILLARY
Glucose-Capillary: 229 mg/dL — ABNORMAL HIGH (ref 70–99)
Glucose-Capillary: 253 mg/dL — ABNORMAL HIGH (ref 70–99)

## 2020-06-24 LAB — CBC
HCT: 39 % (ref 39.0–52.0)
Hemoglobin: 12.9 g/dL — ABNORMAL LOW (ref 13.0–17.0)
MCH: 26.6 pg (ref 26.0–34.0)
MCHC: 33.1 g/dL (ref 30.0–36.0)
MCV: 80.4 fL (ref 80.0–100.0)
Platelets: 291 10*3/uL (ref 150–400)
RBC: 4.85 MIL/uL (ref 4.22–5.81)
RDW: 13.2 % (ref 11.5–15.5)
WBC: 4.4 10*3/uL (ref 4.0–10.5)
nRBC: 0 % (ref 0.0–0.2)

## 2020-06-24 LAB — LEGIONELLA PNEUMOPHILA SEROGP 1 UR AG: L. pneumophila Serogp 1 Ur Ag: NEGATIVE

## 2020-06-24 LAB — MAGNESIUM: Magnesium: 1.8 mg/dL (ref 1.7–2.4)

## 2020-06-24 MED ORDER — HYDRALAZINE HCL 25 MG PO TABS: 25.0000 mg | ORAL_TABLET | Freq: Three times a day (TID) | ORAL | 0 refills | Status: DC

## 2020-06-24 MED ORDER — FUROSEMIDE 40 MG PO TABS: 40.0000 mg | ORAL_TABLET | Freq: Every day | ORAL | 0 refills | Status: DC

## 2020-06-24 MED ORDER — ISOSORBIDE MONONITRATE ER 30 MG PO TB24: 30.0000 mg | ORAL_TABLET | Freq: Every day | ORAL | 0 refills | Status: DC

## 2020-06-24 MED ORDER — ATORVASTATIN CALCIUM 40 MG PO TABS: 40.0000 mg | ORAL_TABLET | Freq: Every day | ORAL | 0 refills | Status: DC

## 2020-06-24 MED ORDER — GLIMEPIRIDE 4 MG PO TABS: 4.0000 mg | ORAL_TABLET | Freq: Every day | ORAL | 0 refills | Status: DC

## 2020-06-24 MED ORDER — CEFUROXIME AXETIL 500 MG PO TABS: 500.0000 mg | ORAL_TABLET | Freq: Two times a day (BID) | ORAL | 0 refills | Status: DC

## 2020-06-24 MED ORDER — CEFUROXIME AXETIL 500 MG PO TABS: 500.0000 mg | ORAL_TABLET | Freq: Two times a day (BID) | ORAL | 0 refills | Status: AC

## 2020-06-24 NOTE — Progress Notes (Signed)
Patient refused his last IV dose of Zithromax. I educated patient and notified MD.

## 2020-06-24 NOTE — Plan of Care (Signed)
  Problem: Education: Goal: Knowledge of General Education information will improve Description: Including pain rating scale, medication(s)/side effects and non-pharmacologic comfort measures Outcome: Adequate for Discharge   Problem: Clinical Measurements: Goal: Ability to maintain clinical measurements within normal limits will improve Outcome: Adequate for Discharge Goal: Will remain free from infection Outcome: Adequate for Discharge Goal: Diagnostic test results will improve Outcome: Adequate for Discharge Goal: Respiratory complications will improve Outcome: Adequate for Discharge Goal: Cardiovascular complication will be avoided Outcome: Adequate for Discharge   Problem: Health Behavior/Discharge Planning: Goal: Ability to manage health-related needs will improve Outcome: Adequate for Discharge

## 2020-06-24 NOTE — Discharge Summary (Signed)
Physician Discharge Summary  Timothy [name] [id number] DOB: [birth year] DOA: 06/22/2020  PCP: Patient, No Pcp Per (Inactive)  Admit date: 06/22/2020 Discharge date: 06/24/2020  Admitted From: Home Disposition: Home  Recommendations for Outpatient Follow-up:  1. Follow up with PCP in 1 week with repeat CBC/BMP 2. Follow up in ED if symptoms worsen or new appear   Home Health: No Equipment/Devices: None  Discharge Condition: Stable CODE STATUS: Full Diet recommendation: Heart healthy  Brief/Interim Summary: 50 year old male who has not been vaccinated against [id number], chronic diastolic CHF currently not on diuretics and does not follow-up with a PCP or cardiologist, hypertension, diabetes mellitus type 2 presented with worsening shortness of breath along with lower extremity swelling and dizziness.  On presentation he was febrile to 101.6 F, tachypneic, hypertensive with saturations of 86% on room air and 95% with 2 L nasal cannula oxygen.  Creatinine was 2.71, troponin of 16, WBC 12.2.  COVID-19 and flu were negative.  Chest x-ray showed multifocal pneumonia with concerns for hypersensitivity pneumonitis versus noncardiogenic pulmonary edema.  He was started on IV antibiotics and Lasix.  During the hospitalization his respiratory status has improved.  Currently on room air.  Wants to go home today.  He will be discharged home on oral Ceftin and Lasix.  Discharge Diagnoses:   Acute hypoxic respiratory failure -From pneumonia and CHF exacerbation.   -Resolved.  Currently on room air.  Multifocal pneumonia -COVID-19 and influenza were negative.    Currently on Rocephin and Zithromax.  Cultures negative so far.  Discharged home today on oral Ceftin for 5 days.  Probable acute on chronic diastolic heart failure -Patient was admitted in May 2021 with acute diastolic heart failure but currently is not on he does not have a PCP or a cardiologist. -Echo shows EF of 60 to 65% with  indeterminate left ventricular diastolic parameters -Currently on Lasix 40 mg IV every 12 hours.   -Diuresed well.  Currently on Imdur and hydralazine.  Will discharge home on Lasix 40 mg daily and current dose of Imdur and increase hydralazine to 25 mg 3 times daily. -Counseled regarding compliance with diet and fluid restriction.  He needs to be seen by a cardiologist as an outpatient.  AKI on CKD stage IIIa -Creatinine 1.79 a year ago.Seen creatinine 2.71 on presentation.    Worsened to 2.94.    Creatinine 2.73 today.  Renal ultrasound was negative for hydronephrosis.  Diuretic plan as above. -I have advised him to probably make an appointment with [facility] to see a nephrologist as an outpatient. -Patient currently does not have a primary care provider and I reiterated the fact that close follow-up with PCP is very important.  He states that he will work on it after discharge.  Leukocytosis -Resolved  Anemia of chronic disease  -Probably from renal failure.  Hemoglobin stable  Diabetes mellitus type 2 with hyperglycemia -A1c 12.    Carb modified diet.  Will not resume metformin because of renal function.  Will start Amaryl 4 mg daily upon discharge.  Not using insulin because he does not have a PCP currently.  But after he gets established with a PCP, recommend insulin therapy.  Hyperlipidemia -Continue statin  Discharge Instructions  Discharge Instructions    Ambulatory referral to Cardiology   Complete by: As directed    CHF followup   Diet - low sodium heart healthy   Complete by: As directed    Diet Carb Modified   Complete by: As  directed    Increase activity slowly   Complete by: As directed      Allergies as of 06/24/2020   No Known Allergies     Medication List    STOP taking these medications   amLODipine-benazepril 10-20 MG capsule Commonly known as: LOTREL   glipiZIDE 5 MG tablet Commonly known as: GLUCOTROL   hydrochlorothiazide 25 MG  tablet Commonly known as: HYDRODIURIL   Januvia 100 MG tablet Generic drug: sitaGLIPtin   levocetirizine 5 MG tablet Commonly known as: XYZAL   metFORMIN 1000 MG tablet Commonly known as: GLUCOPHAGE   naproxen sodium 220 MG tablet Commonly known as: ALEVE   omeprazole 40 MG capsule Commonly known as: PRILOSEC     TAKE these medications   atorvastatin 40 MG tablet Commonly known as: LIPITOR Take 1 tablet (40 mg total) by mouth daily.   cefUROXime 500 MG tablet Commonly known as: CEFTIN Take 1 tablet (500 mg total) by mouth 2 (two) times daily for 5 days.   furosemide 40 MG tablet Commonly known as: Lasix Take 1 tablet (40 mg total) by mouth daily.   glimepiride 4 MG tablet Commonly known as: AMARYL Take 1 tablet (4 mg total) by mouth daily.   hydrALAZINE 25 MG tablet Commonly known as: APRESOLINE Take 1 tablet (25 mg total) by mouth 3 (three) times daily.   isosorbide mononitrate 30 MG 24 hr tablet Commonly known as: IMDUR Take 1 tablet (30 mg total) by mouth daily.       Follow-up [place] Follow up on 10/25/2020.   Specialty: Internal Medicine Why: Wednesday at [facility] for your hospital follow up appointment.  If you have some flexibility in your schedule, call them and ask them to put you on their wait list.  They will then call you when they get a cancellation-sometimes same day Contact information: [place] [phone]       [facility] [place]. Schedule an appointment as soon as possible for a visit in 1 week(s).   Contact information: [place] [address] [phone]              No Known Allergies  Consultations:  None   Procedures/Studies: US RENAL  Result Date: 06/23/2020 CLINICAL DATA:  Acute kidney injury. EXAM: RENAL / URINARY TRACT ULTRASOUND COMPLETE COMPARISON:  None. FINDINGS: Right Kidney: Renal measurements: 10.9 x 6.0 x 4.9 cm = volume:  166.2 mL. Increased renal echogenicity but normal renal cortical thickness and no hydronephrosis or renal lesions. Left Kidney: Renal measurements: 12.0 x 6.1 x 4.9 cm = volume: 185.6 mL. Increased echogenicity but normal renal cortical thickness. No hydronephrosis or renal lesions. Bladder: Appears normal for degree of bladder distention. Other: None. IMPRESSION: 1. Normal renal size and normal renal cortical thickness. 2. No worrisome renal lesions or hydronephrosis. 3. Increased echogenicity kidneys suggesting medical renal disease. 4. Normal bladder. Electronically Signed   By: Marijo Sanes M.D.   On: 06/23/2020 12:22   DG Chest Port 1 View  Result Date: 06/22/2020 CLINICAL DATA:  Fever and shortness of breath EXAM: PORTABLE CHEST 1 VIEW COMPARISON:  Jun 19, 2019 FINDINGS: There is airspace opacity throughout left mid and lower lung regions as well as in the inferior aspect of the left upper lobe region. Ill-defined airspace opacity also in portions of right mid and lower lung, less pronounced than on the left. Heart is upper normal in size with pulmonary  vascularity normal. No adenopathy. No bone lesions. IMPRESSION: Airspace opacity at multiple sites bilaterally, more on the left than on the right. Suspect multifocal pneumonia, potentially of atypical organism etiology. Check of COVID-19 status in this regard advised. Differential considerations include hypersensitivity pneumonitis and noncardiogenic pulmonary edema. Heart upper normal in size. No adenopathy appreciable by radiography. Electronically Signed   By: Lowella Grip III M.D.   On: 06/22/2020 10:27   ECHOCARDIOGRAM COMPLETE  Result Date: 06/22/2020    ECHOCARDIOGRAM REPORT   Patient Name:   Meshulem Dunagan [name] Date of Exam: 06/22/2020 Medical Rec #:  [id number]             Height:       66.0 in Accession #:    [id number]            Weight:       180.1 lb Date of Birth:  05-27-1970             BSA:          1.913 m Patient Age:    25  years              BP:           141/80 mmHg Patient Gender: M                     HR:           84 bpm. Exam Location:  Inpatient Procedure: 2D Echo, Cardiac Doppler and Color Doppler Indications:    CHF-Acute Diastolic [id number]  History:        Patient has prior history of Echocardiogram examinations, most                 recent 06/19/2019. CHF; Risk Factors:Diabetes.  Sonographer:    Bernadene Person RDCS Referring Phys: [id number] [employer]  1. Left ventricular ejection fraction, by estimation, is 60 to 65%. The left ventricle has normal function. The left ventricle has no regional wall motion abnormalities. There is moderate left ventricular hypertrophy. Left ventricular diastolic parameters are indeterminate.  2. Right ventricular systolic function is normal. The right ventricular size is normal. There is normal pulmonary artery systolic pressure. The estimated right ventricular systolic pressure is [id number] mmHg.  3. Left atrial size was mildly dilated.  4. The mitral valve is normal in structure. Trivial mitral valve regurgitation. No evidence of mitral stenosis.  5. The aortic valve is tricuspid. Aortic valve regurgitation is trivial. No aortic stenosis is present.  6. The inferior vena cava is normal in size with greater than 50% respiratory variability, suggesting right atrial pressure of 3 mmHg. FINDINGS  Left Ventricle: Left ventricular ejection fraction, by estimation, is 60 to 65%. The left ventricle has normal function. The left ventricle has no regional wall motion abnormalities. The left ventricular internal cavity size was normal in size. There is  moderate left ventricular hypertrophy. Left ventricular diastolic parameters are indeterminate. Right Ventricle: The right ventricular size is normal. No increase in right ventricular wall thickness. Right ventricular systolic function is normal. There is normal pulmonary artery systolic pressure. The tricuspid regurgitant velocity is 2.35 m/s, and   with an assumed right atrial pressure of 3 mmHg, the estimated right ventricular systolic pressure is 99991111 mmHg. Left Atrium: Left atrial size was mildly dilated. Right Atrium: Right atrial size was normal in size. Pericardium: There is no evidence of pericardial effusion. Mitral Valve: The mitral valve is normal in structure. Trivial  mitral valve regurgitation. No evidence of mitral valve stenosis. Tricuspid Valve: The tricuspid valve is normal in structure. Tricuspid valve regurgitation is trivial. No evidence of tricuspid stenosis. Aortic Valve: The aortic valve is tricuspid. Aortic valve regurgitation is trivial. No aortic stenosis is present. Pulmonic Valve: The pulmonic valve was normal in structure. Pulmonic valve regurgitation is trivial. No evidence of pulmonic stenosis. Aorta: The aortic root is normal in size and structure. Venous: The inferior vena cava is normal in size with greater than 50% respiratory variability, suggesting right atrial pressure of 3 mmHg. IAS/Shunts: The interatrial septum was not well visualized.  LEFT VENTRICLE PLAX 2D LVIDd:         4.30 cm  Diastology LVIDs:         2.90 cm  LV e' medial:    6.02 cm/s LV PW:         1.55 cm  LV E/e' medial:  11.7 LV IVS:        1.60 cm  LV e' lateral:   7.20 cm/s LVOT diam:     2.00 cm  LV E/e' lateral: 9.8 LV SV:         62 LV SV Index:   32 LVOT Area:     3.14 cm  RIGHT VENTRICLE RV S prime:     17.40 cm/s TAPSE (M-mode): 2.8 cm LEFT ATRIUM             Index       RIGHT ATRIUM           Index LA diam:        4.10 cm 2.14 cm/m  RA Area:     13.80 cm LA Vol (A2C):   62.1 ml 32.47 ml/m RA Volume:   30.30 ml  15.84 ml/m LA Vol (A4C):   63.9 ml 33.41 ml/m LA Biplane Vol: 68.0 ml 35.55 ml/m  AORTIC VALVE LVOT Vmax:   102.00 cm/s LVOT Vmean:  77.500 cm/s LVOT VTI:    0.196 m  AORTA Ao Root diam: 3.70 cm Ao Asc diam:  3.60 cm MITRAL VALVE               TRICUSPID VALVE MV Area (PHT): 4.77 cm    TR Peak grad:   22.1 mmHg MV Decel Time: 159 msec     TR Vmax:        235.00 cm/s MV E velocity: 70.60 cm/s MV A velocity: 53.30 cm/s  SHUNTS MV E/A ratio:  1.32        Systemic VTI:  0.20 m                            Systemic Diam: 2.00 cm Cherlynn Kaiser MD Electronically signed by Cherlynn Kaiser MD Signature Date/Time: 06/22/2020/5:44:02 PM    Final        Subjective: Patient seen and examined at bedside.  He feels much better and wants to go home today.  No overnight fever or vomiting reported.  Shortness of breath has improved.  Discharge Exam: Vitals:   06/24/20 0832 06/24/20 0930  BP: (!) 180/103 (!) 160/94  Pulse:  78  Resp:    Temp:    SpO2:      General: Pt is alert, awake, not in acute distress.  Currently on room air Cardiovascular: rate controlled, S1/S2 + Respiratory: bilateral decreased breath sounds at bases with some scattered crackles Abdominal: Soft, NT, ND, bowel sounds + Extremities: Lower extremity  edema has improved; no cyanosis    The results of significant diagnostics from this hospitalization (including imaging, microbiology, ancillary and laboratory) are listed below for reference.     Microbiology: Recent Results (from the past 240 hour(s))  Blood culture (routine single)     Status: None (Preliminary result)   Collection Time: 06/22/20 10:12 AM   Specimen: BLOOD  Result Value Ref Range Status   Specimen Description   Final    BLOOD LEFT ANTECUBITAL Performed at [facility] [address] [place], [place] [address]    Special Requests   Final    BOTTLES DRAWN AEROBIC AND ANAEROBIC Blood Culture adequate volume Performed at [facility] [address] [place], [place] [address]    Culture   Final    NO GROWTH < 24 HOURS Performed at [facility] [address], [place], [place] [address]    Report Status PENDING  Incomplete  Resp Panel by RT-PCR (Flu A&B, Covid) Nasopharyngeal Swab     Status: None   Collection Time: 06/22/20 10:12 AM    Specimen: Nasopharyngeal Swab; Nasopharyngeal(NP) swabs in vial transport medium  Result Value Ref Range Status   SARS Coronavirus 2 by RT PCR NEGATIVE NEGATIVE Final    Comment: (NOTE) SARS-CoV-2 target nucleic acids are NOT DETECTED.  The SARS-CoV-2 RNA is generally detectable in upper respiratory specimens during the acute phase of infection. The lowest concentration of SARS-CoV-2 viral copies this assay can detect is 138 copies/mL. A negative result does not preclude SARS-Cov-2 infection and should not be used as the sole basis for treatment or other patient management decisions. A negative result may occur with  improper specimen collection/handling, submission of specimen other than nasopharyngeal swab, presence of viral mutation(s) within the areas targeted by this assay, and inadequate number of viral copies(<138 copies/mL). A negative result must be combined with clinical observations, patient history, and epidemiological information. The expected result is Negative.  Fact Sheet for Patients:  EntrepreneurPulse.com.au  Fact Sheet for Healthcare Providers:  IncredibleEmployment.be  This test is no t yet approved or cleared by the [place] FDA and  has been authorized for detection and/or diagnosis of SARS-CoV-2 by FDA under an Emergency Use Authorization (EUA). This EUA will remain  in effect (meaning this test can be used) for the duration of the COVID-19 declaration under Section 564(b)(1) of the Act, 21 U.S.C.section 360bbb-3(b)(1), unless the authorization is terminated  or revoked sooner.       Influenza A by PCR NEGATIVE NEGATIVE Final   Influenza B by PCR NEGATIVE NEGATIVE Final    Comment: (NOTE) The Xpert Xpress SARS-CoV-2/FLU/RSV plus assay is intended as an aid in the diagnosis of influenza from Nasopharyngeal swab specimens and should not be used as a sole basis for treatment. Nasal washings and aspirates are  unacceptable for Xpert Xpress SARS-CoV-2/FLU/RSV testing.  Fact Sheet for Patients: EntrepreneurPulse.com.au  Fact Sheet for Healthcare Providers: IncredibleEmployment.be  This test is not yet approved or cleared by the [place] FDA and has been authorized for detection and/or diagnosis of SARS-CoV-2 by FDA under an Emergency Use Authorization (EUA). This EUA will remain in effect (meaning this test can be used) for the duration of the COVID-19 declaration under Section 564(b)(1) of the Act, 21 U.S.C. section 360bbb-3(b)(1), unless the authorization is terminated or revoked.  Performed at [facility], [place] [address] [place], [place] [address]   Urine culture     Status: None  Collection Time: 06/22/20 12:15 PM   Specimen: In/Out Cath Urine  Result Value Ref Range Status   Specimen Description   Final    IN/OUT CATH URINE Performed at [facility], [place] [address] [place], [place] [address]    Special Requests   Final    NONE Performed at [facility], [place] [address], [place], [place] [address]    Culture   Final    NO GROWTH Performed at [facility], [place] [address], [place], [place] [address]    Report Status 06/23/2020 FINAL  Final     Labs: BNP (last 3 results) Recent Labs    06/22/20 1012  BNP [id number]   Basic Metabolic Panel: Recent Labs  Lab 06/22/20 1012 06/23/20 0441 06/24/20 0606  NA 136 137 137  K 4.4 4.3 4.3  CL 108 106 100  CO2 '22 27 29  '$ GLUCOSE 211* 201* 251*  BUN 21* 26* 25*  CREATININE 2.71* 2.94* 2.73*  CALCIUM 8.0* 7.9* 8.7*  MG  --   --  1.8   Liver Function Tests: Recent Labs  Lab 06/22/20 1012  AST 17  ALT 12  ALKPHOS 66  BILITOT 0.6  PROT 7.0  ALBUMIN 2.8*   No results for input(s): LIPASE, AMYLASE in the last 168 hours. No results for input(s): AMMONIA in the last 168 hours. CBC: Recent Labs  Lab 06/22/20 1012  06/23/20 0441 06/24/20 0606  WBC 12.2* 7.9 4.4  NEUTROABS 10.5*  --   --   HGB 12.5* 11.2* 12.9*  HCT 37.4* 33.6* 39.0  MCV 79.9* 80.6 80.4  PLT 282 265 291   Cardiac Enzymes: No results for input(s): CKTOTAL, CKMB, CKMBINDEX, TROPONINI in the last 168 hours. BNP: Invalid input(s): POCBNP CBG: Recent Labs  Lab 06/23/20 0754 06/23/20 1142 06/23/20 1700 06/23/20 2110 06/24/20 0744  GLUCAP 212* 268* 193* 238* 229*   D-Dimer No results for input(s): DDIMER in the last 72 hours. Hgb A1c Recent Labs    06/22/20 1531  HGBA1C 12.0*   Lipid Profile Recent Labs    06/22/20 1410  CHOL 185  HDL 29*  LDLCALC 137*  TRIG 97  CHOLHDL 6.4   Thyroid function studies No results for input(s): TSH, T4TOTAL, T3FREE, THYROIDAB in the last 72 hours.  Invalid input(s): FREET3 Anemia work up No results for input(s): VITAMINB12, FOLATE, FERRITIN, TIBC, IRON, RETICCTPCT in the last 72 hours. Urinalysis    Component Value Date/Time   COLORURINE STRAW (A) 06/22/2020 1215   APPEARANCEUR CLEAR 06/22/2020 1215   LABSPEC 1.009 06/22/2020 1215   PHURINE 5.0 06/22/2020 1215   GLUCOSEU 50 (A) 06/22/2020 1215   HGBUR SMALL (A) 06/22/2020 1215   BILIRUBINUR NEGATIVE 06/22/2020 1215   KETONESUR NEGATIVE 06/22/2020 1215   PROTEINUR 100 (A) 06/22/2020 1215   NITRITE NEGATIVE 06/22/2020 1215   LEUKOCYTESUR NEGATIVE 06/22/2020 1215   Sepsis Labs Invalid input(s): PROCALCITONIN,  WBC,  LACTICIDVEN Microbiology Recent Results (from the past 240 hour(s))  Blood culture (routine single)     Status: None (Preliminary result)   Collection Time: 06/22/20 10:12 AM   Specimen: BLOOD  Result Value Ref Range Status   Specimen Description   Final    BLOOD LEFT ANTECUBITAL Performed at [facility], [place] [address], [place], [place] [address]    Special Requests   Final    BOTTLES DRAWN AEROBIC AND ANAEROBIC Blood Culture adequate volume Performed at [facility] [place]  [place], [place] [address]    Culture   Final    NO GROWTH < 24 HOURS Performed at [facility], [place] [address], [place], [place] [address]    Report Status PENDING  Incomplete  Resp Panel by RT-PCR (Flu A&B, Covid) Nasopharyngeal Swab     Status: None   Collection Time: 06/22/20 10:12 AM   Specimen: Nasopharyngeal Swab; Nasopharyngeal(NP) swabs in vial transport medium  Result Value Ref Range Status   SARS Coronavirus 2 by RT PCR NEGATIVE NEGATIVE Final    Comment: (NOTE) SARS-CoV-2 target nucleic acids are NOT DETECTED.  The SARS-CoV-2 RNA is generally detectable in upper respiratory specimens during the acute phase of infection. The lowest concentration of SARS-CoV-2 viral copies this assay can detect is 138 copies/mL. A negative result does not preclude SARS-Cov-2 infection and should not be used as the sole basis for treatment or other patient management decisions. A negative result may occur with  improper specimen collection/handling, submission of specimen other than nasopharyngeal swab, presence of viral mutation(s) within the areas targeted by this assay, and inadequate number of viral copies(<138 copies/mL). A negative result must be combined with clinical observations, patient history, and epidemiological information. The expected result is Negative.  Fact Sheet for Patients:  EntrepreneurPulse.com.au  Fact Sheet for Healthcare Providers:  IncredibleEmployment.be  This test is no t yet approved or cleared by the [place] FDA and  has been authorized for detection and/or diagnosis of SARS-CoV-2 by FDA under an Emergency Use Authorization (EUA). This EUA will remain  in effect (meaning this test can be used) for the duration of the COVID-19 declaration under Section 564(b)(1) of the Act, 21 U.S.C.section 360bbb-3(b)(1), unless the authorization is terminated  or revoked sooner.        Influenza A by PCR NEGATIVE NEGATIVE Final   Influenza B by PCR NEGATIVE NEGATIVE Final    Comment: (NOTE) The Xpert Xpress SARS-CoV-2/FLU/RSV plus assay is intended as an aid in the diagnosis of influenza from Nasopharyngeal swab specimens and should not be used as a sole basis for treatment. Nasal washings and aspirates are unacceptable for Xpert Xpress SARS-CoV-2/FLU/RSV testing.  Fact Sheet for Patients: EntrepreneurPulse.com.au  Fact Sheet for Healthcare Providers: IncredibleEmployment.be  This test is not yet approved or cleared by the [place] FDA and has been authorized for detection and/or diagnosis of SARS-CoV-2 by FDA under an Emergency Use Authorization (EUA). This EUA will remain in effect (meaning this test can be used) for the duration of the COVID-19 declaration under Section 564(b)(1) of the Act, 21 U.S.C. section 360bbb-3(b)(1), unless the authorization is terminated or revoked.  Performed at [facility], [place] [address] [place], [place] [address]   Urine culture     Status: None   Collection Time: 06/22/20 12:15 PM   Specimen: In/Out Cath Urine  Result Value Ref Range Status   Specimen Description   Final    IN/OUT CATH URINE Performed at [facility] [address], [place], [place] [address]    Special Requests   Final    NONE Performed at [facility], [place] [address], [place], [place] [address]    Culture   Final    NO GROWTH Performed at [facility], [place] [address] [place], [place] [address]    Report Status 06/23/2020 FINAL  Final     Time coordinating discharge: 35 minutes  SIGNED:   Aline August, MD  [facility] 06/24/2020, 11:42 AM

## 2020-06-27 LAB — CULTURE, BLOOD (SINGLE)
Culture: NO GROWTH
Special Requests: ADEQUATE

## 2020-07-03 NOTE — Progress Notes (Deleted)
Cardiology Office Note:    Date:  07/03/2020   ID:  Timothy [name], DOB October 16, 1970, MRN [id number]  PCP:  Patient, No Pcp Per (Inactive)  Cardiologist:  None  Electrophysiologist:  None   Referring MD: Aline August, MD   No chief complaint on file. ***  History of Present Illness:    Timothy [name] is a 50 y.o. male with a hx of chronic diastolic heart failure, hypertension, T2DM who presents as a hospital follow-up for heart failure.  He was admitted from 5/19 through 06/24/2020 with pneumonia/acute on chronic diastolic heart failure.  Presented with fever to 101.6, SPO2 86% on room air, creatinine 2.71, WBC 12.2.  Chest x-ray showed multifocal pneumonia with concern for hypersensitivity pneumonitis versus pulmonary edema.  He was treated with IV antibiotics and Lasix.  COVID-19 was negative.  He was diuresed with IV Lasix 40 mg twice daily and discharged on p.o. Lasix 40 mg daily.  Creatinine 2.73 on discharge.  Echocardiogram 06/19/2019 showed normal biventricular function, severe LVH, mild MR/AI.  Echocardiogram 06/22/2020 showed normal biventricular function, indeterminate diastolic function, moderate LVH, RVSP 25, RAP 3, no significant valvular disease.    Past Medical History:  Diagnosis Date  . CHF (congestive heart failure) ([place])   . Diabetes mellitus without complication [facility])     Past Surgical History:  Procedure Laterality Date  . EYE SURGERY      Current Medications: No outpatient medications have been marked as taking for the 07/05/20 encounter (Appointment) with Donato Heinz, MD.     Allergies:   Patient has no known allergies.   Social History   Socioeconomic History  . Marital status: Single    Spouse name: Not on file  . Number of children: Not on file  . Years of education: Not on file  . Highest education level: Not on file  Occupational History  . Not on file  Tobacco Use  . Smoking status: Never Smoker  . Smokeless  tobacco: Never Used  Substance and Sexual Activity  . Alcohol use: Not Currently  . Drug use: Not Currently  . Sexual activity: Not on file  Other Topics Concern  . Not on file  Social History Narrative  . Not on file   Social Determinants of Health   Financial Resource Strain: Not on file  Food Insecurity: Not on file  Transportation Needs: Not on file  Physical Activity: Not on file  Stress: Not on file  Social Connections: Not on file     Family History: The patient's ***family history is not on file.  ROS:   Please see the history of present illness.    *** All other systems reviewed and are negative.  EKGs/Labs/Other Studies Reviewed:    The following studies were reviewed today: ***  EKG:  EKG is *** ordered today.  The ekg ordered today demonstrates ***  Recent Labs: 06/22/2020: ALT 12; B Natriuretic Peptide 218.6 06/24/2020: BUN 25; Creatinine, Ser 2.73; Hemoglobin 12.9; Magnesium 1.8; Platelets 291; Potassium 4.3; Sodium 137  Recent Lipid Panel    Component Value Date/Time   CHOL 185 06/22/2020 1410   TRIG 97 06/22/2020 1410   HDL 29 (L) 06/22/2020 1410   CHOLHDL 6.4 06/22/2020 1410   VLDL 19 06/22/2020 1410   LDLCALC 137 (H) 06/22/2020 1410    Physical Exam:    VS:  There were no vitals taken for this visit.    Wt Readings from Last 3 Encounters:  06/24/20 179 lb  3.7 oz (81.3 kg)  06/21/19 182 lb 6.4 oz (82.7 kg)     GEN: *** Well nourished, well developed in no acute distress HEENT: Normal NECK: No JVD; No carotid bruits LYMPHATICS: No lymphadenopathy CARDIAC: ***RRR, no murmurs, rubs, gallops RESPIRATORY:  Clear to auscultation without rales, wheezing or rhonchi  ABDOMEN: Soft, non-tender, non-distended MUSCULOSKELETAL:  No edema; No deformity  SKIN: Warm and dry NEUROLOGIC:  Alert and oriented x 3 PSYCHIATRIC:  Normal affect   ASSESSMENT:    No diagnosis found. PLAN:     Chronic diastolic heart failure: Echocardiogram 06/22/2020  showed normal biventricular function, indeterminate diastolic function, moderate LVH, RVSP 25, RAP 3, no significant valvular disease. -Continue Lasix 40 mg daily  Hypertension: On hydralazine 25 mg 3 times daily and Imdur 30 mg daily  Hyperlipidemia: On atorvastatin 40 mg daily.  LDL 137 on 06/22/2020  CKD stage IV: Creatinine 2.73 on 06/24/2020.  Referred to nephrology  RTC in***   Medication Adjustments/Labs and Tests Ordered: Current medicines are reviewed at length with the patient today.  Concerns regarding medicines are outlined above.  No orders of the defined types were placed in this encounter.  No orders of the defined types were placed in this encounter.   There are no Patient Instructions on file for this visit.   Signed, Donato Heinz, MD  07/03/2020 11:14 AM    [place]

## 2020-07-04 ENCOUNTER — Ambulatory Visit: Payer: Self-pay | Admitting: Cardiology

## 2020-07-05 ENCOUNTER — Ambulatory Visit (INDEPENDENT_AMBULATORY_CARE_PROVIDER_SITE_OTHER): Payer: Self-pay | Admitting: Cardiology

## 2020-07-05 ENCOUNTER — Encounter: Payer: Self-pay | Admitting: Cardiology

## 2020-07-05 ENCOUNTER — Other Ambulatory Visit: Payer: Self-pay

## 2020-07-05 VITALS — BP 160/110 | HR 79 | Ht 66.0 in | Wt 181.2 lb

## 2020-07-05 DIAGNOSIS — N184 Chronic kidney disease, stage 4 (severe): Secondary | ICD-10-CM

## 2020-07-05 DIAGNOSIS — I10 Essential (primary) hypertension: Secondary | ICD-10-CM

## 2020-07-05 DIAGNOSIS — E785 Hyperlipidemia, unspecified: Secondary | ICD-10-CM

## 2020-07-05 DIAGNOSIS — I5032 Chronic diastolic (congestive) heart failure: Secondary | ICD-10-CM

## 2020-07-05 MED ORDER — HYDRALAZINE HCL 50 MG PO TABS: 50.0000 mg | ORAL_TABLET | Freq: Three times a day (TID) | ORAL | 3 refills | Status: DC

## 2020-07-05 NOTE — Patient Instructions (Addendum)
Medication Instructions:  INCREASE hydralazine to 50 mg three times daily   *If you need a refill on your cardiac medications before your next appointment, please call your pharmacy*   Lab Work: BMET, Mag, BNP today  If you have labs (blood work) drawn today and your tests are completely normal, you will receive your results only by: Marland Kitchen MyChart Message (if you have MyChart) OR . A paper copy in the mail If you have any lab test that is abnormal or we need to change your treatment, we will call you to review the results.  Follow-Up: At [facility], you and your health needs are our priority.  As part of our continuing mission to provide you with exceptional heart care, we have created designated Provider Care Teams.  These Care Teams include your primary Cardiologist (physician) and Advanced Practice Providers (APPs -  Physician Assistants and Nurse Practitioners) who all work together to provide you with the care you need, when you need it.  We recommend signing up for the patient portal called "MyChart".  Sign up information is provided on this After Visit Summary.  MyChart is used to connect with patients for Virtual Visits (Telemedicine).  Patients are able to view lab/test results, encounter notes, upcoming appointments, etc.  Non-urgent messages can be sent to your provider as well.   To learn more about what you can do with MyChart, go to [email or web address].    Your next appointment:  2 weeks with pharmacist (blood pressure management) 3 months with Dr. Gardiner Rhyme  Other Instructions You have been referred to: Nephrology-[facility]  Please check your blood pressure at home twice daily, write it down.  Call the office or send message via Mychart with the readings in 2 weeks for Dr. Gardiner Rhyme to review.

## 2020-07-05 NOTE — Progress Notes (Signed)
Cardiology Office Note:    Date:  07/05/2020   ID:  Timothy [name], DOB 1970/07/10, MRN [id number]  PCP:  Patient, No Pcp Per (Inactive)  Cardiologist:  None  Electrophysiologist:  None   Referring MD: Aline August, MD   Chief Complaint  Patient presents with  . Congestive Heart Failure    History of Present Illness:    Timothy [name] is a 50 y.o. male with a hx of chronic diastolic heart failure, hypertension, T2DM who presents as a hospital follow-up for heart failure.  He was admitted from 5/19 through 06/24/2020 with pneumonia/acute on chronic diastolic heart failure.  Presented with fever to 101.6, SPO2 86% on room air, creatinine 2.71, WBC 12.2.  Chest x-ray showed multifocal pneumonia with concern for hypersensitivity pneumonitis versus pulmonary edema.  He was treated with IV antibiotics and Lasix.  COVID-19 was negative.  He was diuresed with IV Lasix 40 mg twice daily and discharged on p.o. Lasix 40 mg daily.  Creatinine 2.73 on discharge.  Echocardiogram 06/19/2019 showed normal biventricular function, severe LVH, mild MR/AI.  Echocardiogram 06/22/2020 showed normal biventricular function, indeterminate diastolic function, moderate LVH, RVSP 25, RAP 3, no significant valvular disease.  Today, he is doing well. He states he sometimes experiences SOB throughout the day. He also experiences lightheadedness intermittently when he stands.  He states that he does not weigh himself or check his blood pressure at home. He denies any chest pain, palpitations, syncope, or lower extremity edema.    Past Medical History:  Diagnosis Date  . CHF (congestive heart failure) ([place])   . Diabetes mellitus without complication [facility])     Past Surgical History:  Procedure Laterality Date  . EYE SURGERY      Current Medications: Current Meds  Medication Sig  . atorvastatin (LIPITOR) 40 MG tablet Take 1 tablet (40 mg total) by mouth daily.  . furosemide (LASIX) 40 MG tablet Take  1 tablet (40 mg total) by mouth daily.  Marland Kitchen glimepiride (AMARYL) 4 MG tablet Take 1 tablet (4 mg total) by mouth daily.  . isosorbide mononitrate (IMDUR) 30 MG 24 hr tablet Take 1 tablet (30 mg total) by mouth daily.  . [DISCONTINUED] hydrALAZINE (APRESOLINE) 25 MG tablet Take 1 tablet (25 mg total) by mouth 3 (three) times daily.     Allergies:   Patient has no known allergies.   Social History   Socioeconomic History  . Marital status: Single    Spouse name: Not on file  . Number of children: Not on file  . Years of education: Not on file  . Highest education level: Not on file  Occupational History  . Not on file  Tobacco Use  . Smoking status: Never Smoker  . Smokeless tobacco: Never Used  Substance and Sexual Activity  . Alcohol use: Not Currently  . Drug use: Not Currently  . Sexual activity: Not on file  Other Topics Concern  . Not on file  Social History Narrative  . Not on file   Social Determinants of Health   Financial Resource Strain: Not on file  Food Insecurity: Not on file  Transportation Needs: Not on file  Physical Activity: Not on file  Stress: Not on file  Social Connections: Not on file     Family History: The patient's family history is not on file.  ROS:   Please see the history of present illness.    All other systems reviewed and are negative.  EKGs/Labs/Other Studies Reviewed:  The following studies were reviewed today:   EKG:   07/05/2020- The EKG ordered today demonstrates Sinus rhythm, rate 79, LVH with repolarization changes, T wave inversions in leads 1 and 2, AVF  Recent Labs: 06/22/2020: ALT 12 06/24/2020: Hemoglobin 12.9; Platelets 291 07/05/2020: BNP WILL FOLLOW; BUN 24; Creatinine, Ser 2.55; Magnesium 1.7; Potassium 4.6; Sodium 138  Recent Lipid Panel    Component Value Date/Time   CHOL 185 06/22/2020 1410   TRIG 97 06/22/2020 1410   HDL 29 (L) 06/22/2020 1410   CHOLHDL 6.4 06/22/2020 1410   VLDL 19 06/22/2020 1410    LDLCALC 137 (H) 06/22/2020 1410    Physical Exam:    VS:  BP (!) 160/110   Pulse 79   Ht '5\' 6"'$  (1.676 m)   Wt 181 lb 3.2 oz (82.2 kg)   SpO2 93%   BMI 29.25 kg/m     Wt Readings from Last 3 Encounters:  07/05/20 181 lb 3.2 oz (82.2 kg)  06/24/20 179 lb 3.7 oz (81.3 kg)  06/21/19 182 lb 6.4 oz (82.7 kg)     GEN: Well nourished, well developed in no acute distress HEENT: Normal NECK: No JVD; No carotid bruits LYMPHATICS: crackles at lung bases, left greater than right  CARDIAC: RRR, no murmurs, rubs, gallops RESPIRATORY:  Clear to auscultation without rales, wheezing or rhonchi  ABDOMEN: Soft, non-tender, non-distended MUSCULOSKELETAL:  Trace lower extremity edema; No deformity  SKIN: Warm and dry NEUROLOGIC:  Alert and oriented x 3 PSYCHIATRIC:  Normal affect   ASSESSMENT:    1. Chronic diastolic heart failure ([place])   2. Essential hypertension   3. Hyperlipidemia, unspecified hyperlipidemia type   4. CKD (chronic kidney disease) stage 4, GFR 15-29 ml/min (HCC)    PLAN:     Chronic diastolic heart failure: Echocardiogram 06/22/2020 showed normal biventricular function, indeterminate diastolic function, moderate LVH, RVSP 25, RAP 3, no significant valvular disease. -Appears euvolemic on exam today.  Continue Lasix 40 mg daily, will check BMP, BNP, magnesium  Hypertension: During recent hospitalization was started on hydralazine 25 mg 3 times daily and Imdur 30 mg daily.  BP remains elevated, will increase hydralazine to 50 mg 3 times daily.  Schedule follow-up in pharmacy hypertension clinic in 2 weeks.  Asked patient to start monitoring BP at home and bring log to future appointments  Hyperlipidemia: LDL 137 on 06/22/2020, started on atorvastatin 40 mg daily  CKD stage IV: Creatinine 2.73 on 06/24/2020.  Referred to nephrology.  Will recheck BMP   RTC in 3 months    Medication Adjustments/Labs and Tests Ordered: Current medicines are reviewed at length with the  patient today.  Concerns regarding medicines are outlined above.  Orders Placed This Encounter  Procedures  . Basic metabolic panel  . Brain natriuretic peptide  . Magnesium  . Ambulatory referral to Nephrology  . EKG 12-Lead   Meds ordered this encounter  Medications  . DISCONTD: hydrALAZINE (APRESOLINE) 50 MG tablet    Sig: Take 1 tablet (50 mg total) by mouth 3 (three) times daily.    Dispense:  270 tablet    Refill:  3    Dose increase  . hydrALAZINE (APRESOLINE) 50 MG tablet    Sig: Take 1 tablet (50 mg total) by mouth 3 (three) times daily.    Dispense:  270 tablet    Refill:  3    Dose increase    Patient Instructions  Medication Instructions:  INCREASE hydralazine to 50 mg three times daily   *  If you need a refill on your cardiac medications before your next appointment, please call your pharmacy*   Lab Work: BMET, Mag, BNP today  If you have labs (blood work) drawn today and your tests are completely normal, you will receive your results only by: Marland Kitchen MyChart Message (if you have MyChart) OR . A paper copy in the mail If you have any lab test that is abnormal or we need to change your treatment, we will call you to review the results.  Follow-Up: At [facility], you and your health needs are our priority.  As part of our continuing mission to provide you with exceptional heart care, we have created designated Provider Care Teams.  These Care Teams include your primary Cardiologist (physician) and Advanced Practice Providers (APPs -  Physician Assistants and Nurse Practitioners) who all work together to provide you with the care you need, when you need it.  We recommend signing up for the patient portal called "MyChart".  Sign up information is provided on this After Visit Summary.  MyChart is used to connect with patients for Virtual Visits (Telemedicine).  Patients are able to view lab/test results, encounter notes, upcoming appointments, etc.  Non-urgent messages  can be sent to your provider as well.   To learn more about what you can do with MyChart, go to [email or web address].    Your next appointment:  2 weeks with pharmacist (blood pressure management) 3 months with Dr. Gardiner Rhyme  Other Instructions You have been referred to: Nephrology-[facility]  Please check your blood pressure at home twice daily, write it down.  Call the office or send message via Mychart with the readings in 2 weeks for Dr. Gardiner Rhyme to review.       Frederic Jericho [place],acting as a Education administrator for Donato Heinz, MD.,have documented all relevant documentation on the behalf of Donato Heinz, MD,as directed by  Donato Heinz, MD while in the presence of Donato Heinz, MD.  I, Donato Heinz, MD, have reviewed all documentation for this visit. The documentation on 07/05/20 for the exam, diagnosis, procedures, and orders are all accurate and complete.   Signed, Donato Heinz, MD  07/05/2020 11:32 PM    [place]

## 2020-07-06 LAB — BASIC METABOLIC PANEL
BUN/Creatinine Ratio: 9 (ref 9–20)
BUN: 24 mg/dL (ref 6–24)
CO2: 21 mmol/L (ref 20–29)
Calcium: 8.5 mg/dL — ABNORMAL LOW (ref 8.7–10.2)
Chloride: 105 mmol/L (ref 96–106)
Creatinine, Ser: 2.55 mg/dL — ABNORMAL HIGH (ref 0.76–1.27)
Glucose: 196 mg/dL — ABNORMAL HIGH (ref 65–99)
Potassium: 4.6 mmol/L (ref 3.5–5.2)
Sodium: 138 mmol/L (ref 134–144)
eGFR: 30 mL/min/{1.73_m2} — ABNORMAL LOW (ref >59)

## 2020-07-06 LAB — BRAIN NATRIURETIC PEPTIDE: BNP: 150.2 pg/mL — ABNORMAL HIGH (ref 0.0–100.0)

## 2020-07-06 LAB — MAGNESIUM: Magnesium: 1.7 mg/dL (ref 1.6–2.3)

## 2020-07-17 ENCOUNTER — Encounter: Payer: Self-pay | Admitting: *Deleted

## 2020-07-26 ENCOUNTER — Other Ambulatory Visit: Payer: Self-pay

## 2020-07-26 ENCOUNTER — Ambulatory Visit (INDEPENDENT_AMBULATORY_CARE_PROVIDER_SITE_OTHER): Payer: 59 | Admitting: Pharmacist

## 2020-07-26 ENCOUNTER — Encounter: Payer: Self-pay | Admitting: Pharmacist

## 2020-07-26 VITALS — BP 202/108 | HR 81 | Wt 184.4 lb

## 2020-07-26 DIAGNOSIS — I509 Heart failure, unspecified: Secondary | ICD-10-CM | POA: Diagnosis not present

## 2020-07-26 DIAGNOSIS — N170 Acute kidney failure with tubular necrosis: Secondary | ICD-10-CM | POA: Diagnosis not present

## 2020-07-26 DIAGNOSIS — I10 Essential (primary) hypertension: Secondary | ICD-10-CM

## 2020-07-26 MED ORDER — FUROSEMIDE 40 MG PO TABS: 40.0000 mg | ORAL_TABLET | Freq: Every day | ORAL | 2 refills | Status: DC

## 2020-07-26 MED ORDER — ISOSORBIDE MONONITRATE ER 30 MG PO TB24: 30.0000 mg | ORAL_TABLET | Freq: Every day | ORAL | 2 refills | Status: DC

## 2020-07-26 MED ORDER — CONTOUR NEXT MONITOR W/DEVICE KIT: 1.0000 | PACK | Freq: Four times a day (QID) | 0 refills | Status: AC

## 2020-07-26 MED ORDER — ATORVASTATIN CALCIUM 40 MG PO TABS: 40.0000 mg | ORAL_TABLET | Freq: Every day | ORAL | 2 refills | Status: DC

## 2020-07-26 MED ORDER — LANCETS MICRO THIN 33G MISC: 1.0000 | Freq: Four times a day (QID) | 12 refills | Status: DC

## 2020-07-26 MED ORDER — GLIMEPIRIDE 4 MG PO TABS: 4.0000 mg | ORAL_TABLET | Freq: Every day | ORAL | 2 refills | Status: DC

## 2020-07-26 MED ORDER — CONTOUR NEXT TEST VI STRP: ORAL_STRIP | 12 refills | Status: DC

## 2020-07-26 NOTE — Patient Instructions (Addendum)
   It was nice meeting you today  I refilled your furosemide, isosorbide, and atorvastatin.  I want you to go to the pharmacy and pick up your medications and take your furosemide.  If you continue to have trouble breathing and you still think you are swelling, I want you to go to the hospital  Take a hydralazine when you get home, we would like to get your blood pressure down  Please try to establish with a primary care doctor.  A phone number for the [facility] is [phone].    Address: [address] [place], [place] [address]  I will see you back on 08/10/20  Karren Cobble, PharmD, BCACP, [place], [place] [id number] [place], [place] [address] Phone: [phone]; Fax: [phone] 07/26/2020 9:00 AM

## 2020-07-26 NOTE — Progress Notes (Signed)
Patient ID: Timothy [name]                 DOB: [birth year]                      MRN: [id number]     HPI: Timothy [name] is a 50 y.o. male referred by Dr. Gardiner [name] to HTN clinic. PMH is significant for HTN, CHF (EF 60-65%), DM (A1c 12.0%), and CKD (eGFR 30). Discharged from hositpial on 5/21 with pneumonia/HF.  At last visit with Dr Timothy [name] hydralazine was increased to 109m TID and patient was asked to monitor home BP readings.  Patient reports today for first visit for HTN/CHF management. Feels very poorly.  Ran out of medications 4 days ago.  Feet are swelling and he is having trouble breathing, especially with exertion.  Has been sleeping sitting up because when he lays down he feels like he can not breathe.  Has very little appetite.  Has hydralazine 542mat home. Has not taken yet this morning and reports he occasionally forgets his midday dose.  Has been checking blood pressure at home but did not bring log or machine.  Reports it has been high. Has not been checking blood sugar because he does not have a meter.    Reports dizziness, but denies chest pain. Reports blurry vision.  Checked blood glucose in room: 157  Had a ride to appointment today and has an appointment with nephrology tomorrow.  Does not currently have a PCP and would like an opthalmology referral.  Recently enrolled in medical insurance plan.    Current HTN meds: hydralazine 508mID, furosemide 46m66mily, Imdur 30 daily BP goal: <130/80   Wt Readings from Last 3 Encounters:  07/05/20 181 lb 3.2 oz (82.2 kg)  06/24/20 179 lb 3.7 oz (81.3 kg)  06/21/19 182 lb 6.4 oz (82.7 kg)   BP Readings from Last 3 Encounters:  07/05/20 (!) 160/110  06/24/20 (!) 160/94  06/21/19 (!) 146/79   Pulse Readings from Last 3 Encounters:  07/05/20 79  06/24/20 78  06/21/19 79    Renal function: CrCl cannot be calculated (Unknown ideal weight.).  Past Medical History:  Diagnosis Date   CHF (congestive heart  failure) (HCC)    Diabetes mellitus without complication (HCC)[place]  Current Outpatient Medications on File Prior to Visit  Medication Sig Dispense Refill   atorvastatin (LIPITOR) 40 MG tablet Take 1 tablet (40 mg total) by mouth daily. 30 tablet 0   furosemide (LASIX) 40 MG tablet Take 1 tablet (40 mg total) by mouth daily. 30 tablet 0   glimepiride (AMARYL) 4 MG tablet Take 1 tablet (4 mg total) by mouth daily. 30 tablet 0   hydrALAZINE (APRESOLINE) 50 MG tablet Take 1 tablet (50 mg total) by mouth 3 (three) times daily. 270 tablet 3   isosorbide mononitrate (IMDUR) 30 MG 24 hr tablet Take 1 tablet (30 mg total) by mouth daily. 30 tablet 0   No current facility-administered medications on file prior to visit.    No Known Allergies   Assessment/Plan:  1. HTN/CHF -  Patient blood pressure in room today 202/108 which is above goal of <130/80 however patient has not taken medications this morning. Concern regarding patients difficulty breathing and being out of furosemide.  Will refill current medications and instructed patient to go directly to pharmacy and pick up.  If swelling and SOB continues, patient knows to report to ER.  Instructed patient to take hydralazine as soon as he gets home and to begin taking TID as prescribed.   Briefly explained pathophysiology of HF as well as DM.  Blood sugar not elevated in room however previous A1c very high and patient having vision changes.  Will send patient BG testing supplies. Explained how elevated BP and BG will negatively affect his renal function and vision.    Recommended patient abstain from salty foods  Patient inquired about how to see an ophthalmologist and PCP.  Gave patient phone number and address of [facility] PCP providers to schedule new patient appointment.  Patient grateful for assistance.  Plan Patient to restart furosemide 58m daily.  IF swelling and SOB do not improve, report to hospital for IV diuretics.  Continue Imdur  352mdaily Continue hydralzine 5030mID Recheck in 2 weeks hopefully with plans to start beta blocker  ChrKarren CobbleharmD, BCACP, [id number] [place] [id number]: [phone]: [phone]/2022 10:06 AM

## 2020-07-27 ENCOUNTER — Telehealth: Payer: Self-pay | Admitting: Pharmacist

## 2020-07-27 NOTE — Telephone Encounter (Signed)
Called and spoke with patient to see how he was feeling.  Reports he is feeling much better since restarting his medication.  Swelling has started going down and he could breathe better last night.  Patient was able to make a PCP appointment as well.  Pt appreciative of call.

## 2020-07-28 ENCOUNTER — Other Ambulatory Visit (HOSPITAL_BASED_OUTPATIENT_CLINIC_OR_DEPARTMENT_OTHER): Payer: Self-pay

## 2020-08-04 ENCOUNTER — Other Ambulatory Visit (HOSPITAL_COMMUNITY): Payer: Self-pay | Admitting: Nephrology

## 2020-08-04 DIAGNOSIS — E1129 Type 2 diabetes mellitus with other diabetic kidney complication: Secondary | ICD-10-CM

## 2020-08-04 DIAGNOSIS — N179 Acute kidney failure, unspecified: Secondary | ICD-10-CM

## 2020-08-04 DIAGNOSIS — R809 Proteinuria, unspecified: Secondary | ICD-10-CM

## 2020-08-10 ENCOUNTER — Other Ambulatory Visit: Payer: Self-pay

## 2020-08-10 ENCOUNTER — Ambulatory Visit: Payer: 59 | Admitting: Pharmacist

## 2020-08-10 ENCOUNTER — Encounter (INDEPENDENT_AMBULATORY_CARE_PROVIDER_SITE_OTHER): Payer: 59 | Admitting: Ophthalmology

## 2020-08-10 DIAGNOSIS — I10 Essential (primary) hypertension: Secondary | ICD-10-CM

## 2020-08-10 DIAGNOSIS — H43811 Vitreous degeneration, right eye: Secondary | ICD-10-CM | POA: Diagnosis not present

## 2020-08-10 DIAGNOSIS — E113511 Type 2 diabetes mellitus with proliferative diabetic retinopathy with macular edema, right eye: Secondary | ICD-10-CM | POA: Diagnosis not present

## 2020-08-10 DIAGNOSIS — H35031 Hypertensive retinopathy, right eye: Secondary | ICD-10-CM | POA: Diagnosis not present

## 2020-08-10 NOTE — Progress Notes (Deleted)
Patient ID: Timothy [name]                 DOB: 1970/06/30                      MRN: [id number]     HPI: Timothy [name] is a 50 y.o. male referred by Dr. Gardiner Rhyme to HTN clinic. PMH is significant for HTN, chronic diastolic CHF with most recent EF 60-65% on 06/22/20 echo, uncontrolled DM with A1c 12% on 06/22/20, and CKD stage IV. Discharged from hositpial on 5/21 with pneumonia/HF.  At last visit with Dr Gardiner Rhyme on 07/05/20, hydralazine was increased to 35m TID and patient was asked to monitor home BP readings. At follow up with PharmD on 6/22, he reported running out of his meds 4 days prior. BP was subsequently extremely elevated at 202/108 and pt reported SOB with exertion and swelling in his feet.  Taking meds? Swelling and SOB on exertion better? Checking bp and glucose at home? How was nephrology visit did they change any meds? Start kerendia 166mdaily if egfr > 25 and jardiance if egfr > 20, and coreg, inc hydral  Diet exercise today  retina visit today at 1pm, new pt visit PCP 7/19 for DM, kidney biopsy 7/14, [employer]    Current HTN/HF meds:  hydralazine 5055mID furosemide 23m76mily Imdur 30 daily  BP goal: <130/80mm18mSocial History: denies tobacco, alcohol, and drug use  Family History: not on file  Wt Readings from Last 3 Encounters:  07/26/20 184 lb 6.4 oz (83.6 kg)  07/05/20 181 lb 3.2 oz (82.2 kg)  06/24/20 179 lb 3.7 oz (81.3 kg)   BP Readings from Last 3 Encounters:  07/26/20 (!) 202/108  07/05/20 (!) 160/110  06/24/20 (!) 160/94   Pulse Readings from Last 3 Encounters:  07/26/20 81  07/05/20 79  06/24/20 78    Renal function: CrCl cannot be calculated (Patient's most recent lab result is older than the maximum 21 days allowed.).  Past Medical History:  Diagnosis Date   CHF (congestive heart failure) (HCC)    Diabetes mellitus without complication (HCC) [place] Current Outpatient Medications on File Prior to Visit  Medication  Sig Dispense Refill   atorvastatin (LIPITOR) 40 MG tablet Take 1 tablet (40 mg total) by mouth daily. 30 tablet 2   Blood Glucose Monitoring Suppl (CONTOUR NEXT MONITOR) w/Device KIT 1 kit by Does not apply route in the morning, at noon, in the evening, and at bedtime. 1 kit 0   furosemide (LASIX) 40 MG tablet Take 1 tablet (40 mg total) by mouth daily. 30 tablet 2   glimepiride (AMARYL) 4 MG tablet Take 1 tablet (4 mg total) by mouth daily. 30 tablet 2   glucose blood (CONTOUR NEXT TEST) test strip Use as instructed 100 each 12   hydrALAZINE (APRESOLINE) 50 MG tablet Take 1 tablet (50 mg total) by mouth 3 (three) times daily. 270 tablet 3   isosorbide mononitrate (IMDUR) 30 MG 24 hr tablet Take 1 tablet (30 mg total) by mouth daily. 30 tablet 2   Lancets Micro Thin 33G MISC 1 each by Does not apply route in the morning, at noon, in the evening, and at bedtime. 100 each 12   No current facility-administered medications on file prior to visit.    No Known Allergies   Assessment/Plan:  1. HTN/CHF -  Patient blood pressure in room today 202/108 which is  above goal of <130/80 however patient has not taken medications this morning. Concern regarding patients difficulty breathing and being out of furosemide.  Will refill current medications and instructed patient to go directly to pharmacy and pick up.  If swelling and SOB continues, patient knows to report to ER. Instructed patient to take hydralazine as soon as he gets home and to begin taking TID as prescribed.   Briefly explained pathophysiology of HF as well as DM.  Blood sugar not elevated in room however previous A1c very high and patient having vision changes.  Will send patient BG testing supplies. Explained how elevated BP and BG will negatively affect his renal function and vision.     Plan Patient to restart furosemide 74m daily.  IF swelling and SOB do not improve, report to hospital for IV diuretics.  Continue Imdur 336m daily Continue hydralzine 5068mID Recheck in 2 weeks hopefully with plans to start beta blocker  ChrKarren CobbleharmD, BCACP, CDCNewberryPPPine Island[address]reSchuylkill HavenC [id number]: [phone]: [phone]/2022 12:15 PM

## 2020-08-16 ENCOUNTER — Other Ambulatory Visit: Payer: Self-pay | Admitting: Student

## 2020-08-17 ENCOUNTER — Ambulatory Visit (HOSPITAL_COMMUNITY): Admission: RE | Admit: 2020-08-17 | Payer: Medicare Other | Source: Ambulatory Visit

## 2020-08-18 ENCOUNTER — Encounter (INDEPENDENT_AMBULATORY_CARE_PROVIDER_SITE_OTHER): Payer: 59 | Admitting: Ophthalmology

## 2020-08-18 ENCOUNTER — Other Ambulatory Visit: Payer: Self-pay

## 2020-08-18 DIAGNOSIS — E113511 Type 2 diabetes mellitus with proliferative diabetic retinopathy with macular edema, right eye: Secondary | ICD-10-CM | POA: Diagnosis not present

## 2020-08-22 ENCOUNTER — Encounter (HOSPITAL_BASED_OUTPATIENT_CLINIC_OR_DEPARTMENT_OTHER): Payer: Self-pay | Admitting: Family Medicine

## 2020-08-22 ENCOUNTER — Ambulatory Visit (INDEPENDENT_AMBULATORY_CARE_PROVIDER_SITE_OTHER): Payer: Medicare Other | Admitting: Family Medicine

## 2020-08-22 ENCOUNTER — Other Ambulatory Visit: Payer: Self-pay

## 2020-08-22 VITALS — BP 164/116 | HR 83 | Ht 66.0 in | Wt 181.2 lb

## 2020-08-22 DIAGNOSIS — E1122 Type 2 diabetes mellitus with diabetic chronic kidney disease: Secondary | ICD-10-CM | POA: Diagnosis not present

## 2020-08-22 DIAGNOSIS — I509 Heart failure, unspecified: Secondary | ICD-10-CM

## 2020-08-22 DIAGNOSIS — I10 Essential (primary) hypertension: Secondary | ICD-10-CM

## 2020-08-22 DIAGNOSIS — E119 Type 2 diabetes mellitus without complications: Secondary | ICD-10-CM | POA: Insufficient documentation

## 2020-08-22 DIAGNOSIS — N184 Chronic kidney disease, stage 4 (severe): Secondary | ICD-10-CM | POA: Diagnosis not present

## 2020-08-22 MED ORDER — LANTUS SOLOSTAR 100 UNIT/ML ~~LOC~~ SOPN: 10.0000 [IU] | PEN_INJECTOR | Freq: Every day | SUBCUTANEOUS | 3 refills | Status: DC

## 2020-08-22 MED ORDER — BLOOD GLUCOSE METER KIT: PACK | 2 refills | Status: DC

## 2020-08-22 MED ORDER — ALCOHOL PREP PADS: 1.0000 | MEDICATED_PAD | 1 refills | Status: DC

## 2020-08-22 MED ORDER — CAREFINE PEN NEEDLES 32G X 4 MM MISC: 1.0000 | 1 refills | Status: DC

## 2020-08-22 NOTE — Assessment & Plan Note (Addendum)
Uncontrolled with recent hemoglobin A1c at 12.0% Given underlying CKD, some limitations in regards to medications that can be utilized Discussed options with patient, will proceed with long-acting insulin Discussed titration schedule, starting with Lantus 10 units once daily and adjusting dose based upon 3-day average fasting blood sugar readings We will plan for close follow-up in the office with patient to monitor titration and emphasize importance of blood sugar and blood pressure control Referral placed to nutritionist for further discussion regarding lifestyle modifications related to diabetes, heart failure and hypertension At next visit, discuss PNA vaccine, complete foot exam, request records from nephrology

## 2020-08-22 NOTE — Assessment & Plan Note (Signed)
Blood pressure has not been at goal, most recent visit with cardiology had elevated blood pressure reading, patient has been out of medications for a few days at that point Indicates that he has been taking his medications regularly since then Does have nephrology later this week for renal biopsy Continue with current medication regimen, maintain blood pressure log at home for review at next office visit

## 2020-08-22 NOTE — Progress Notes (Signed)
New Patient Office Visit  Subjective:  Patient ID: Timothy [name], male    DOB: 1970/07/31  Age: 50 y.o. MRN: [id number]  CC:  Chief Complaint  Patient presents with   Establish Care    No Prior PCP   Shortness of Breath    Patient complaints of shortness of breath and wheezing even when he takes his medications as prescribed   Edema    Patient complaining of bilateral foot swelling. He states they are not as bad today as they were yesterday.     HPI Timothy [name] is a 50 year old male presenting to establish in clinic.  Patient recurrent issues of lower extremity edema, intermittent dyspnea.  Past medical history significant for hypertension, heart failure, uncontrolled diabetes, chronic kidney disease stage IV.  Dyspnea: Patient did have recent hospitalization due to shortness of breath where he was diagnosed with community-acquired pneumonia and also with acute heart failure.  He has recently been following with cardiology regarding management of his CHF as well as hypertension.  Last visit with cardiology was last month.  Since that time he has been having fluctuating shortness of breath.  He is currently taking Lasix daily.  Notes that shortness of breath is worse when lying flat and he has been having to sleep in a chair due to this.  Hypertension: Has been following with cardiology.  At most recent visit he had been without some medications for a few days.  He is currently managed on hydralazine, isosorbide mononitrate.  He denies any current issues with chest pain.  Has been having intermittent dyspnea as above  Diabetes: Most recent hemoglobin A1c was obtained during hospitalization and found to be 12.0%.  He has been checking his blood sugars at home, about twice daily.  Reports that readings tend to be in the 150s, will check fasting as well as nighttime blood sugar readings.  Currently has been taking glimepiride 4 mg.  Chronic disease: Has been evaluated with  nephrology, has renal biopsy scheduled for later this week.  He has been seeing [facility].  Discussed with patient no specific dietary restrictions, modifications.  Does endorse that the will occasionally have foods high in sodium, although he has not been paying close attention to this specifically.  Did ask about fast foods which patient reports that he will occasionally eat, has had more servings of this in the recent week.  Has not worked with nutritionist in the past. Patient has been trying to incorporate some physical activity, notably walking.  Reports that when he does try to do some walking he will have some lightheadedness/dizziness that will make him have to sit down after about 5 minutes of trying to walk.  Past Medical History:  Diagnosis Date   CHF (congestive heart failure) (HCC)    Diabetes mellitus without complication ([place])     Past Surgical History:  Procedure Laterality Date   EYE SURGERY      History reviewed. No pertinent family history.  Social History   Socioeconomic History   Marital status: Single    Spouse name: Not on file   Number of children: Not on file   Years of education: Not on file   Highest education level: Not on file  Occupational History   Not on file  Tobacco Use   Smoking status: Never   Smokeless tobacco: Never  Substance and Sexual Activity   Alcohol use: Not Currently   Drug use: Not Currently   Sexual activity:  Not on file  Other Topics Concern   Not on file  Social History Narrative   Not on file   Social Determinants of Health   Financial Resource Strain: Not on file  Food Insecurity: Not on file  Transportation Needs: Not on file  Physical Activity: Not on file  Stress: Not on file  Social Connections: Not on file  Intimate Partner Violence: Not on file    Objective:   Today's Vitals: BP (!) 164/116   Pulse 83   Ht 5' 6"  (1.676 m)   Wt 181 lb 3.2 oz (82.2 kg)   SpO2 96%   BMI 29.25 kg/m   Physical  Exam  50 year old male in no acute distress Cardiovascular exam with regular rate and rhythm, no murmurs appreciated Lungs with expiratory crackles bilaterally  Assessment & Plan:   Problem List Items Addressed This Visit       Cardiovascular and Mediastinum   CHF (congestive heart failure) ([place]) - Primary    Long discussion with patient regarding medications, lifestyle modifications Continue with Lasix as prescribed Discussed the need for sodium restriction in the diet to avoid fluid overload worsening of dyspnea, patient voices understanding Will refer to nutritionist for further discussion and recommendations If having notable worsening of dyspnea, low threshold of presenting to emergency room for evaluation Continue close follow-up with cardiology       Relevant Orders   Amb ref to Medical Nutrition Therapy-MNT   Accelerated hypertension    Blood pressure has not been at goal, most recent visit with cardiology had elevated blood pressure reading, patient has been out of medications for a few days at that point Indicates that he has been taking his medications regularly since then Does have nephrology later this week for renal biopsy Continue with current medication regimen, maintain blood pressure log at home for review at next office visit         Endocrine   Type 2 diabetes mellitus ([place])    Uncontrolled with recent hemoglobin A1c at 12.0% Given underlying CKD, some limitations in regards to medications that can be utilized Discussed options with patient, will proceed with long-acting insulin Discussed titration schedule, starting with Lantus 10 units once daily and adjusting dose based upon 3-day average fasting blood sugar readings We will plan for close follow-up in the office with patient to monitor titration and emphasize importance of blood sugar and blood pressure control Referral placed to nutritionist for further discussion regarding lifestyle modifications  related to diabetes, heart failure and hypertension At next visit, discuss PNA vaccine, complete foot exam, request records from nephrology       Relevant Medications   insulin glargine (LANTUS SOLOSTAR) 100 UNIT/ML Solostar Pen   Insulin Pen Needle (CAREFINE PEN NEEDLES) 32G X 4 MM MISC   Alcohol Swabs (ALCOHOL PREP) PADS   blood glucose meter kit and supplies   Other Relevant Orders   Amb ref to Medical Nutrition Therapy-MNT    Outpatient Encounter Medications as of 08/22/2020  Medication Sig   Alcohol Swabs (ALCOHOL PREP) PADS 1 applicator by Does not apply route See admin instructions.   atorvastatin (LIPITOR) 40 MG tablet Take 1 tablet (40 mg total) by mouth daily.   blood glucose meter kit and supplies Dispense based on patient and insurance preference. Use up to four times daily as directed. (FOR ICD-10 E10.9, E11.9).   Blood Glucose Monitoring Suppl (CONTOUR NEXT MONITOR) w/Device KIT 1 kit by Does not apply route in the morning, at noon,  in the evening, and at bedtime.   furosemide (LASIX) 40 MG tablet Take 1 tablet (40 mg total) by mouth daily.   glucose blood (CONTOUR NEXT TEST) test strip Use as instructed   hydrALAZINE (APRESOLINE) 50 MG tablet Take 1 tablet (50 mg total) by mouth 3 (three) times daily.   insulin glargine (LANTUS SOLOSTAR) 100 UNIT/ML Solostar Pen Inject 10 Units into the skin daily. Inject as directed   Insulin Pen Needle (CAREFINE PEN NEEDLES) 32G X 4 MM MISC 1 applicator by Does not apply route See admin instructions.   isosorbide mononitrate (IMDUR) 30 MG 24 hr tablet Take 1 tablet (30 mg total) by mouth daily.   Lancets Micro Thin 33G MISC 1 each by Does not apply route in the morning, at noon, in the evening, and at bedtime.   [DISCONTINUED] glimepiride (AMARYL) 4 MG tablet Take 1 tablet (4 mg total) by mouth daily.   No facility-administered encounter medications on file as of 08/22/2020.    Follow-up: No follow-ups on file.   Ryonna Cimini J [name],  MD

## 2020-08-22 NOTE — Patient Instructions (Addendum)
  Medication Instructions:  Your physician has recommended you make the following change in your medication:  -- STOP GLIMEPRIDE -- START Lantus - Inject 10 units subcutaneously daily. If your 3 day average FASTING blood sugar is above 130, increase by 2 units. If your 3 day average fasting blood sugar is 130 or below then keep the current dosage.  --If you need a refill on any your medications before your next appointment, please call your pharmacy first. If no refills are authorized on file call the office.--  Referrals/Procedures/Imaging: A referral has been placed for you to a Nutritionist for evaluation and treatment. Someone from the scheduling department will be in contact with you in regards to coordinating your consultation. If you do not hear from any of the schedulers within 7-10 business days please give our office a call.  Follow-Up: Your next appointment:   Your physician recommends that you schedule a follow-up appointment in: 2 WEEKS with Dr. [name]  Thanks for letting us be apart of your health journey!!  Primary Care and Sports Medicine   Dr. Arlina Robes [place]   We encourage you to activate your patient portal called "MyChart".  Sign up information is provided on this After Visit Summary.  MyChart is used to connect with patients for Virtual Visits (Telemedicine).  Patients are able to view lab/test results, encounter notes, upcoming appointments, etc.  Non-urgent messages can be sent to your provider as well. To learn more about what you can do with MyChart, please visit --  [email or web address].

## 2020-08-22 NOTE — Assessment & Plan Note (Signed)
Long discussion with patient regarding medications, lifestyle modifications Continue with Lasix as prescribed Discussed the need for sodium restriction in the diet to avoid fluid overload worsening of dyspnea, patient voices understanding Will refer to nutritionist for further discussion and recommendations If having notable worsening of dyspnea, low threshold of presenting to emergency room for evaluation Continue close follow-up with cardiology

## 2020-08-24 ENCOUNTER — Other Ambulatory Visit: Payer: Self-pay | Admitting: Radiology

## 2020-08-25 ENCOUNTER — Encounter (HOSPITAL_COMMUNITY): Payer: Self-pay

## 2020-08-25 ENCOUNTER — Other Ambulatory Visit: Payer: Self-pay

## 2020-08-25 ENCOUNTER — Ambulatory Visit (HOSPITAL_COMMUNITY)
Admission: RE | Admit: 2020-08-25 | Discharge: 2020-08-25 | Disposition: A | Discharge status: Home / Self Care | Payer: Medicare Other | Source: Ambulatory Visit | Attending: Nephrology | Admitting: Nephrology

## 2020-08-25 DIAGNOSIS — R809 Proteinuria, unspecified: Secondary | ICD-10-CM | POA: Diagnosis present

## 2020-08-25 DIAGNOSIS — N179 Acute kidney failure, unspecified: Secondary | ICD-10-CM | POA: Insufficient documentation

## 2020-08-25 DIAGNOSIS — E1129 Type 2 diabetes mellitus with other diabetic kidney complication: Secondary | ICD-10-CM | POA: Insufficient documentation

## 2020-08-25 LAB — CBC
HCT: 34.6 % — ABNORMAL LOW (ref 39.0–52.0)
Hemoglobin: 11.2 g/dL — ABNORMAL LOW (ref 13.0–17.0)
MCH: 25.9 pg — ABNORMAL LOW (ref 26.0–34.0)
MCHC: 32.4 g/dL (ref 30.0–36.0)
MCV: 79.9 fL — ABNORMAL LOW (ref 80.0–100.0)
Platelets: 271 10*3/uL (ref 150–400)
RBC: 4.33 MIL/uL (ref 4.22–5.81)
RDW: 13.9 % (ref 11.5–15.5)
WBC: 4.5 10*3/uL (ref 4.0–10.5)
nRBC: 0 % (ref 0.0–0.2)

## 2020-08-25 LAB — PROTIME-INR
INR: 1 (ref 0.8–1.2)
Prothrombin Time: 13.2 seconds (ref 11.4–15.2)

## 2020-08-25 LAB — GLUCOSE, CAPILLARY: Glucose-Capillary: 122 mg/dL — ABNORMAL HIGH (ref 70–99)

## 2020-08-25 MED ORDER — SODIUM CHLORIDE 0.9 % IV SOLN: INTRAVENOUS | Status: DC

## 2020-08-25 NOTE — Progress Notes (Signed)
Patient presented to [facility] IR for random renal biopsy this morning.   VS showed BP of 204/120, repeat BP 206/115.  Patient states that he did not take his HTN medicine this morning.  Denies HA, changes in vision, n/v.   Dr. Anselm Pancoast discussed increased bleeding risk due to high blood pressure today, recommended better control of blood pressure and reschedule the biopsy.  Patient verbalized understanding.   Random renal biopsy to be rescheduled, patient safe to be discharged home as he is asymptomatic. Dr. Anselm Pancoast is contacting ordering physician Dr. Royce Macadamia.  Armando Gang Saydee Zolman PA-C 08/25/2020 8:01 AM

## 2020-09-07 ENCOUNTER — Ambulatory Visit (INDEPENDENT_AMBULATORY_CARE_PROVIDER_SITE_OTHER): Payer: 59 | Admitting: Family Medicine

## 2020-09-07 ENCOUNTER — Other Ambulatory Visit: Payer: Self-pay

## 2020-09-07 ENCOUNTER — Encounter (HOSPITAL_BASED_OUTPATIENT_CLINIC_OR_DEPARTMENT_OTHER): Payer: Self-pay | Admitting: Family Medicine

## 2020-09-07 VITALS — BP 180/110 | HR 74 | Ht 66.0 in | Wt 184.0 lb

## 2020-09-07 DIAGNOSIS — E1122 Type 2 diabetes mellitus with diabetic chronic kidney disease: Secondary | ICD-10-CM | POA: Diagnosis not present

## 2020-09-07 DIAGNOSIS — N184 Chronic kidney disease, stage 4 (severe): Secondary | ICD-10-CM | POA: Diagnosis not present

## 2020-09-07 DIAGNOSIS — I10 Essential (primary) hypertension: Secondary | ICD-10-CM

## 2020-09-07 DIAGNOSIS — S8991XD Unspecified injury of right lower leg, subsequent encounter: Secondary | ICD-10-CM | POA: Insufficient documentation

## 2020-09-07 DIAGNOSIS — S8991XA Unspecified injury of right lower leg, initial encounter: Secondary | ICD-10-CM

## 2020-09-07 DIAGNOSIS — Z23 Encounter for immunization: Secondary | ICD-10-CM | POA: Diagnosis not present

## 2020-09-07 NOTE — Progress Notes (Signed)
    Procedures performed today:    None.  Independent interpretation of notes and tests performed by another provider:   None.  Brief History, Exam, Impression, and Recommendations:    BP (!) 180/110   Pulse 74   Ht '5\' 6"'$  (1.676 m)   Wt 184 lb (83.5 kg)   SpO2 97%   BMI 29.70 kg/m   Accelerated hypertension Has been checking blood pressure at home, reports that readings are typically 160s/100s Blood pressure was too high when attempting to do kidney biopsy recently and so has not yet had this completed Reports that he was started on losartan 25 mg by nephrology, will need to request records Presently denies any issues with chest pain, shortness of breath, lightheadedness, dizziness, headaches On exam, normal heart rate and regular rhythm, no murmur appreciated Blood pressure elevated in the office today initially at 180/110, repeat reading is 176/96 Continue with current medications, will need to continue close follow-up with cardiology and nephrology Discussed DASH diet, physical activity as tolerated  Knee injury, right, initial encounter 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 Injury occurred while he was in [place], had evaluation at that time, due to general medical conditions did not have any further treatment completed Has current issues related to ambulation and use of right lower extremity as he is not able to flex very much at the knee We will proceed with initial evaluation of x-rays, likely proceed with referral to orthopedic surgery after x-rays are completed Discussed with patient that any surgical consideration may be delayed due to ongoing medical concerns and will need to get good control of blood sugars and blood pressure before  proceeding with any surgical intervention, patient voiced understanding and agreement  Type 2 diabetes mellitus ([place]) Has been able to start on Lantus, taking at night Reports that fasting blood sugars have improved, most recent readings have been in the 130s Currently he is using 14 units of Lantus nightly Overall feels that this is helped him feel better, particularly related to his dizziness of breathing Has not experienced any hypoglycemic symptoms Discussed need for pneumonia vaccine, patient voiced understanding and was interested in proceeding with this today Foot exam completed, has been documented in the chart Continue with insulin titration as previously instructed Plan for follow-up in about 4 weeks to monitor progress, plan to recheck hemoglobin A1c at that time    ___________________________________________ Chanin Frumkin [name], MD, ABFM, [facility] and [place]

## 2020-09-07 NOTE — Assessment & Plan Note (Signed)
Has been checking blood pressure at home, reports that readings are typically 160s/100s Blood pressure was too high when attempting to do kidney biopsy recently and so has not yet had this completed Reports that he was started on losartan 25 mg by nephrology, will need to request records Presently denies any issues with chest pain, shortness of breath, lightheadedness, dizziness, headaches On exam, normal heart rate and regular rhythm, no murmur appreciated Blood pressure elevated in the office today initially at 180/110, repeat reading is 176/96 Continue with current medications, will need to continue close follow-up with cardiology and nephrology Discussed DASH diet, physical activity as tolerated

## 2020-09-07 NOTE — Assessment & Plan Note (Signed)
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 Injury occurred while he was in [place], had evaluation at that time, due to general medical conditions did not have any further treatment completed Has current issues related to ambulation and use of right lower extremity as he is not able to flex very much at the knee We will proceed with initial evaluation of x-rays, likely proceed with referral to orthopedic surgery after x-rays are completed Discussed with patient that any surgical consideration may be delayed due to ongoing medical concerns and will need to get good control of blood sugars and blood pressure before proceeding with any surgical intervention, patient voiced understanding and agreement

## 2020-09-07 NOTE — Patient Instructions (Signed)
  Medication Instructions:  Your physician recommends that you continue on your current medications as directed. Please refer to the Current Medication list given to you today. --If you need a refill on any your medications before your next appointment, please call your pharmacy first. If no refills are authorized on file call the office.-- Follow-Up: Your next appointment:   Your physician recommends that you schedule a follow-up appointment in: 1 MONTH with Dr. [name]  Thanks for letting us be apart of your health journey!!  Primary Care and Sports Medicine   Dr. Arlina Robes [place]   We encourage you to activate your patient portal called "MyChart".  Sign up information is provided on this After Visit Summary.  MyChart is used to connect with patients for Virtual Visits (Telemedicine).  Patients are able to view lab/test results, encounter notes, upcoming appointments, etc.  Non-urgent messages can be sent to your provider as well. To learn more about what you can do with MyChart, please visit --  [email or web address].

## 2020-09-07 NOTE — Assessment & Plan Note (Signed)
Has been able to start on Lantus, taking at night Reports that fasting blood sugars have improved, most recent readings have been in the 130s Currently he is using 14 units of Lantus nightly Overall feels that this is helped him feel better, particularly related to his dizziness of breathing Has not experienced any hypoglycemic symptoms Discussed need for pneumonia vaccine, patient voiced understanding and was interested in proceeding with this today Foot exam completed, has been documented in the chart Continue with insulin titration as previously instructed Plan for follow-up in about 4 weeks to monitor progress, plan to recheck hemoglobin A1c at that time

## 2020-09-15 ENCOUNTER — Encounter (INDEPENDENT_AMBULATORY_CARE_PROVIDER_SITE_OTHER): Payer: 59 | Admitting: Ophthalmology

## 2020-09-21 ENCOUNTER — Encounter (INDEPENDENT_AMBULATORY_CARE_PROVIDER_SITE_OTHER): Payer: 59 | Admitting: Ophthalmology

## 2020-09-21 ENCOUNTER — Other Ambulatory Visit: Payer: Self-pay

## 2020-09-21 DIAGNOSIS — E113511 Type 2 diabetes mellitus with proliferative diabetic retinopathy with macular edema, right eye: Secondary | ICD-10-CM

## 2020-09-21 DIAGNOSIS — H35031 Hypertensive retinopathy, right eye: Secondary | ICD-10-CM | POA: Diagnosis not present

## 2020-09-21 DIAGNOSIS — H43811 Vitreous degeneration, right eye: Secondary | ICD-10-CM

## 2020-09-21 DIAGNOSIS — I10 Essential (primary) hypertension: Secondary | ICD-10-CM | POA: Diagnosis not present

## 2020-09-28 ENCOUNTER — Ambulatory Visit: Payer: Medicare Other | Admitting: Skilled Nursing Facility1

## 2020-10-04 ENCOUNTER — Ambulatory Visit: Payer: Medicare Other | Admitting: Skilled Nursing Facility1

## 2020-10-10 ENCOUNTER — Encounter (HOSPITAL_BASED_OUTPATIENT_CLINIC_OR_DEPARTMENT_OTHER): Payer: Self-pay | Admitting: Family Medicine

## 2020-10-10 ENCOUNTER — Other Ambulatory Visit: Payer: Self-pay

## 2020-10-10 ENCOUNTER — Ambulatory Visit (INDEPENDENT_AMBULATORY_CARE_PROVIDER_SITE_OTHER): Payer: 59 | Admitting: Family Medicine

## 2020-10-10 DIAGNOSIS — I10 Essential (primary) hypertension: Secondary | ICD-10-CM

## 2020-10-10 DIAGNOSIS — E1122 Type 2 diabetes mellitus with diabetic chronic kidney disease: Secondary | ICD-10-CM

## 2020-10-10 DIAGNOSIS — N184 Chronic kidney disease, stage 4 (severe): Secondary | ICD-10-CM

## 2020-10-10 LAB — POCT GLYCOSYLATED HEMOGLOBIN (HGB A1C)
HbA1c POC (<> result, manual entry): 8.1 % (ref 4.0–5.6)
Hemoglobin A1C: 8.1 % — AB (ref 4.0–5.6)

## 2020-10-10 MED ORDER — FUROSEMIDE 40 MG PO TABS: 40.0000 mg | ORAL_TABLET | Freq: Every day | ORAL | 0 refills | Status: DC

## 2020-10-10 NOTE — Patient Instructions (Signed)
  Medication Instructions:  Your physician recommends that you continue on your current medications as directed. Please refer to the Current Medication list given to you today. --If you need a refill on any your medications before your next appointment, please call your pharmacy first. If no refills are authorized on file call the office.--  Lab Work: Your physician has recommended that you have lab work today: A1C If you have labs (blood work) drawn today and your tests are completely normal, you will receive your results via [place] a phone call from our staff.  Please ensure you check your voicemail in the event that you authorized detailed messages to be left on a delegated number. If you have any lab test that is abnormal or we need to change your treatment, we will call you to review the results.  Follow-Up: Your next appointment:   Your physician recommends that you schedule a follow-up appointment in: 4-6 WEEKS with Dr. [name]  Thanks for letting us be apart of your health journey!!  Primary Care and Sports Medicine   Dr. Arlina Robes [place]   We encourage you to activate your patient portal called "MyChart".  Sign up information is provided on this After Visit Summary.  MyChart is used to connect with patients for Virtual Visits (Telemedicine).  Patients are able to view lab/test results, encounter notes, upcoming appointments, etc.  Non-urgent messages can be sent to your provider as well. To learn more about what you can do with MyChart, please visit --  [email or web address].

## 2020-10-10 NOTE — Assessment & Plan Note (Signed)
Blood pressure continues to remain elevated Has follow-up with cardiology this week and nephrology in a couple weeks Was not able to undergo kidney biopsy with nephrology due to elevated blood pressure Requesting refill of Lasix today, will provide refill to allow for patient to follow-up with cardiology to discuss further determination related to blood pressure control Encouraged to continue with monitoring at home, maintain blood pressure log

## 2020-10-10 NOTE — Progress Notes (Signed)
    Procedures performed today:    None.  Independent interpretation of notes and tests performed by another provider:   None.  Brief History, Exam, Impression, and Recommendations:    There were no vitals taken for this visit.  Type 2 diabetes mellitus ([place]) Continues with Lantus, reports taking 17 units daily Checks fasting blood sugars, range has been 80-110 Denies any symptoms of hypoglycemia, no hypoglycemic checks Recommend continuing with Lantus Cautioned on hypoglycemia and associated symptoms and what to do if this occurs Check hemoglobin A1c today  Accelerated hypertension Blood pressure continues to remain elevated Has follow-up with cardiology this week and nephrology in a couple weeks Was not able to undergo kidney biopsy with nephrology due to elevated blood pressure Requesting refill of Lasix today, will provide refill to allow for patient to follow-up with cardiology to discuss further determination related to blood pressure control Encouraged to continue with monitoring at home, maintain blood pressure log  Plan for follow-up in about 4 to 6 weeks or sooner as needed   ___________________________________________ Manal Kreutzer [name], MD, ABFM, CAQSM Primary Care and [place]

## 2020-10-10 NOTE — Assessment & Plan Note (Addendum)
Continues with Lantus, reports taking 17 units daily Checks fasting blood sugars, range has been 80-110 Denies any symptoms of hypoglycemia, no hypoglycemic checks Recommend continuing with Lantus Cautioned on hypoglycemia and associated symptoms and what to do if this occurs Check hemoglobin A1c today

## 2020-10-12 ENCOUNTER — Encounter: Payer: Self-pay | Admitting: Cardiology

## 2020-10-12 ENCOUNTER — Ambulatory Visit (INDEPENDENT_AMBULATORY_CARE_PROVIDER_SITE_OTHER): Payer: 59 | Admitting: Cardiology

## 2020-10-12 ENCOUNTER — Telehealth: Payer: Self-pay | Admitting: Cardiology

## 2020-10-12 ENCOUNTER — Other Ambulatory Visit: Payer: Self-pay

## 2020-10-12 VITALS — BP 158/100 | HR 67 | Ht 66.0 in | Wt 186.2 lb

## 2020-10-12 DIAGNOSIS — I5032 Chronic diastolic (congestive) heart failure: Secondary | ICD-10-CM

## 2020-10-12 DIAGNOSIS — N184 Chronic kidney disease, stage 4 (severe): Secondary | ICD-10-CM

## 2020-10-12 DIAGNOSIS — I10 Essential (primary) hypertension: Secondary | ICD-10-CM | POA: Diagnosis not present

## 2020-10-12 DIAGNOSIS — E785 Hyperlipidemia, unspecified: Secondary | ICD-10-CM

## 2020-10-12 LAB — BASIC METABOLIC PANEL
BUN/Creatinine Ratio: 11 (ref 9–20)
BUN: 35 mg/dL — ABNORMAL HIGH (ref 6–24)
CO2: 22 mmol/L (ref 20–29)
Calcium: 8.2 mg/dL — ABNORMAL LOW (ref 8.7–10.2)
Chloride: 106 mmol/L (ref 96–106)
Creatinine, Ser: 3.14 mg/dL — ABNORMAL HIGH (ref 0.76–1.27)
Glucose: 117 mg/dL — ABNORMAL HIGH (ref 65–99)
Potassium: 5.3 mmol/L — ABNORMAL HIGH (ref 3.5–5.2)
Sodium: 142 mmol/L (ref 134–144)
eGFR: 23 mL/min/{1.73_m2} — ABNORMAL LOW (ref >59)

## 2020-10-12 LAB — LIPID PANEL
Chol/HDL Ratio: 4.9 ratio (ref 0.0–5.0)
Cholesterol, Total: 204 mg/dL — ABNORMAL HIGH (ref 100–199)
HDL: 42 mg/dL (ref >39)
LDL Chol Calc (NIH): 147 mg/dL — ABNORMAL HIGH (ref 0–99)
Triglycerides: 84 mg/dL (ref 0–149)
VLDL Cholesterol Cal: 15 mg/dL (ref 5–40)

## 2020-10-12 MED ORDER — FUROSEMIDE 40 MG PO TABS: 40.0000 mg | ORAL_TABLET | Freq: Every day | ORAL | 3 refills | Status: DC

## 2020-10-12 NOTE — Telephone Encounter (Signed)
Refills has been sent to the pharmacy. 

## 2020-10-12 NOTE — Telephone Encounter (Signed)
*  STAT* If patient is at the pharmacy, call can be transferred to refill team.   1. Which medications need to be refilled? (please list name of each medication and dose if known)  furosemide (LASIX) 40 MG tablet  2. Which pharmacy/location (including street and city if local pharmacy) is medication to be sent to?  [id number] - [place], [place] - [place]  3. Do they need a 30 day or 90 day supply?  90 day supply

## 2020-10-12 NOTE — Patient Instructions (Signed)
Medication Instructions:  No changes *If you need a refill on your cardiac medications before your next appointment, please call your pharmacy*   Lab Work: Your provider would like for you to have the following labs today: BMET and Lipid  If you have labs (blood work) drawn today and your tests are completely normal, you will receive your results only by: [place] (if you have MyChart) OR A paper copy in the mail If you have any lab test that is abnormal or we need to change your treatment, we will call you to review the results.   Testing/Procedures: None ordered   Follow-Up: At [facility], you and your health needs are our priority.  As part of our continuing mission to provide you with exceptional heart care, we have created designated Provider Care Teams.  These Care Teams include your primary Cardiologist (physician) and Advanced Practice Providers (APPs -  Physician Assistants and Nurse Practitioners) who all work together to provide you with the care you need, when you need it.  We recommend signing up for the patient portal called "MyChart".  Sign up information is provided on this After Visit Summary.  MyChart is used to connect with patients for Virtual Visits (Telemedicine).  Patients are able to view lab/test results, encounter notes, upcoming appointments, etc.  Non-urgent messages can be sent to your provider as well.   To learn more about what you can do with MyChart, go to [email or web address].    Your next appointment:   Follow up in 2 weeks with pharmd for medication titration. Bring your blood pressure readings to this appointment Follow up in 6 months with Dr. Gardiner Rhyme   Other Instructions Dr. Gardiner Rhyme would like you to check your blood pressure daily for the next 2 weeks.  Keep a journal of these daily blood pressure and heart rate readings and call our office or send a message through [place] with the results. Thank you!  It is best to check your BP  1-2 hours after taking your medications to see the medications effectiveness on your BP.    Here are some tips that our clinical pharmacists share for home BP monitoring:          Rest 10 minutes before taking your blood pressure.          Don't smoke or drink caffeinated beverages for at least 30 minutes before.          Take your blood pressure before (not after) you eat.          Sit comfortably with your back supported and both feet on the floor (don't cross your legs).          Elevate your arm to heart level on a table or a desk.          Use the proper sized cuff. It should fit smoothly and snugly around your bare upper arm. There should be enough room to slip a fingertip under the cuff. The bottom edge of the cuff should be 1 inch above the crease of the elbow.

## 2020-10-12 NOTE — Progress Notes (Signed)
Cardiology Office Note:    Date:  10/12/2020   ID:  Timothy [name], DOB 08-10-1970, MRN [id number]  PCP:  [name], [name], MD  Cardiologist:  None  Electrophysiologist:  None   Referring MD: No ref. provider found   No chief complaint on file.   History of Present Illness:    Timothy [name] is a 50 y.o. male with a hx of chronic diastolic heart failure, hypertension, T2DM who presents as a follow-up for heart failure.  He was admitted from 5/19 through 06/24/2020 with pneumonia/acute on chronic diastolic heart failure.  Presented with fever to 101.6, SPO2 86% on room air, creatinine 2.71, WBC 12.2.  Chest x-ray showed multifocal pneumonia with concern for hypersensitivity pneumonitis versus pulmonary edema.  He was treated with IV antibiotics and Lasix.  COVID-19 was negative.  He was diuresed with IV Lasix 40 mg twice daily and discharged on p.o. Lasix 40 mg daily.  Creatinine 2.73 on discharge.  Echocardiogram 06/19/2019 showed normal biventricular function, severe LVH, mild MR/AI.  Echocardiogram 06/22/2020 showed normal biventricular function, indeterminate diastolic function, moderate LVH, RVSP 25, RAP 3, no significant valvular disease.  Since last clinic visit, he reports he has been doing okay.  He was seen by nephrology, planned renal biopsy but canceled due to the BP being too high.  Reports losartan increased to 50 mg daily, and has been increased to 50 mg daily.  Denies any chest pain, dyspnea, lightheadedness, syncope, lower extremity edema, or palpitations.  Does not weigh himself at home. Walks outside daily for 2 hours.  Denies any exertional symptoms.  Reports BP has been in 160s when he checks at home.   Wt Readings from Last 3 Encounters:  10/12/20 186 lb 3.2 oz (84.5 kg)  09/07/20 184 lb (83.5 kg)  08/25/20 181 lb (82.1 kg)     Past Medical History:  Diagnosis Date   CHF (congestive heart failure) (HCC)    Diabetes mellitus without complication ([place])      Past Surgical History:  Procedure Laterality Date   EYE SURGERY      Current Medications: Current Meds  Medication Sig   furosemide (LASIX) 40 MG tablet Take 1 tablet (40 mg total) by mouth daily.   glucose blood (CONTOUR NEXT TEST) test strip Use as instructed   hydrALAZINE (APRESOLINE) 50 MG tablet Take 1 tablet (50 mg total) by mouth 3 (three) times daily.   insulin glargine (LANTUS SOLOSTAR) 100 UNIT/ML Solostar Pen Inject 10 Units into the skin daily. Inject as directed   Insulin Pen Needle (CAREFINE PEN NEEDLES) 32G X 4 MM MISC 1 applicator by Does not apply route See admin instructions.   Lancets Micro Thin 33G MISC 1 each by Does not apply route in the morning, at noon, in the evening, and at bedtime.     Allergies:   Patient has no known allergies.   Social History   Socioeconomic History   Marital status: Single    Spouse name: Not on file   Number of children: Not on file   Years of education: Not on file   Highest education level: Not on file  Occupational History   Not on file  Tobacco Use   Smoking status: Never   Smokeless tobacco: Never  Vaping Use   Vaping Use: Never used  Substance and Sexual Activity   Alcohol use: Not Currently   Drug use: Not Currently   Sexual activity: Not on file  Other Topics Concern  Not on file  Social History Narrative   Not on file   Social Determinants of Health   Financial Resource Strain: Not on file  Food Insecurity: Not on file  Transportation Needs: Not on file  Physical Activity: Not on file  Stress: Not on file  Social Connections: Not on file     Family History: The patient's family history is not on file.  ROS:   Please see the history of present illness.    All other systems reviewed and are negative.  EKGs/Labs/Other Studies Reviewed:    The following studies were reviewed today:   EKG:   07/05/2020- The EKG ordered today demonstrates Sinus rhythm, rate 79, LVH with repolarization changes,  T wave inversions in leads 1 and 2, AVF  Recent Labs: 06/22/2020: ALT 12 07/05/2020: BNP 150.2; BUN 24; Creatinine, Ser 2.55; Magnesium 1.7; Potassium 4.6; Sodium 138 08/25/2020: Hemoglobin 11.2; Platelets 271  Recent Lipid Panel    Component Value Date/Time   CHOL 185 06/22/2020 1410   TRIG 97 06/22/2020 1410   HDL 29 (L) 06/22/2020 1410   CHOLHDL 6.4 06/22/2020 1410   VLDL 19 06/22/2020 1410   LDLCALC 137 (H) 06/22/2020 1410    Physical Exam:    VS:  BP (!) 158/100   Pulse 67   Ht '5\' 6"'$  (1.676 m)   Wt 186 lb 3.2 oz (84.5 kg)   SpO2 98%   BMI 30.05 kg/m     Wt Readings from Last 3 Encounters:  10/12/20 186 lb 3.2 oz (84.5 kg)  09/07/20 184 lb (83.5 kg)  08/25/20 181 lb (82.1 kg)     GEN: Well nourished, well developed in no acute distress HEENT: Normal NECK: No JVD; No carotid bruits LYMPHATICS: crackles at lung bases, left greater than right  CARDIAC: RRR, no murmurs, rubs, gallops RESPIRATORY:  Clear to auscultation without rales, wheezing or rhonchi  ABDOMEN: Soft, non-tender, non-distended MUSCULOSKELETAL:  Trace lower extremity edema; No deformity  SKIN: Warm and dry NEUROLOGIC:  Alert and oriented x 3 PSYCHIATRIC:  Normal affect   ASSESSMENT:    1. Chronic diastolic heart failure ([place])   2. Essential hypertension   3. Hyperlipidemia, unspecified hyperlipidemia type   4. CKD (chronic kidney disease) stage 4, GFR 15-29 ml/min (HCC)     PLAN:     Chronic diastolic heart failure: Echocardiogram 06/22/2020 showed normal biventricular function, indeterminate diastolic function, moderate LVH, RVSP 25, RAP 3, no significant valvular disease. -Appears euvolemic on exam today.  Continue Lasix 40 mg daily, will check BMP  Hypertension: On hydralazine 50 mg 3 times daily, Imdur 30 mg daily, losartan 50 mg daily, Lasix 40 mg.  BP remains elevated.  Will check BMP, if stable creatinine plan to increase losartan to 100 mg daily.  Asked to check BP daily for next 2 weeks  and call with results.  Will schedule in pharmacy hypertension clinic in 2 weeks for follow-up.  Hyperlipidemia: LDL 137 on 06/22/2020, started on atorvastatin 40 mg daily.  Will recheck lipid panel  CKD stage IV: Creatinine 2.55 on 07/05/2020.  Follows with nephrology, planning renal biopsy, will obtain records  T2DM: A1c 8.1 on 10/10/2020.  On insulin   RTC in 6 months    Medication Adjustments/Labs and Tests Ordered: Current medicines are reviewed at length with the patient today.  Concerns regarding medicines are outlined above.  Orders Placed This Encounter  Procedures   Basic metabolic panel   Lipid panel    No orders of the  defined types were placed in this encounter.   Patient Instructions  Medication Instructions:  No changes *If you need a refill on your cardiac medications before your next appointment, please call your pharmacy*   Lab Work: Your provider would like for you to have the following labs today: BMET and Lipid  If you have labs (blood work) drawn today and your tests are completely normal, you will receive your results only by: MyChart Message (if you have MyChart) OR A paper copy in the mail If you have any lab test that is abnormal or we need to change your treatment, we will call you to review the results.   Testing/Procedures: None ordered   Follow-Up: At [facility], you and your health needs are our priority.  As part of our continuing mission to provide you with exceptional heart care, we have created designated Provider Care Teams.  These Care Teams include your primary Cardiologist (physician) and Advanced Practice Providers (APPs -  Physician Assistants and Nurse Practitioners) who all work together to provide you with the care you need, when you need it.  We recommend signing up for the patient portal called "MyChart".  Sign up information is provided on this After Visit Summary.  MyChart is used to connect with patients for Virtual Visits  (Telemedicine).  Patients are able to view lab/test results, encounter notes, upcoming appointments, etc.  Non-urgent messages can be sent to your provider as well.   To learn more about what you can do with MyChart, go to [email or web address].    Your next appointment:   Follow up in 2 weeks with pharmd for medication titration. Bring your blood pressure readings to this appointment Follow up in 6 months with Dr. Gardiner Rhyme   Other Instructions Dr. Gardiner Rhyme would like you to check your blood pressure daily for the next 2 weeks.  Keep a journal of these daily blood pressure and heart rate readings and call our office or send a message through [place] with the results. Thank you!  It is best to check your BP 1-2 hours after taking your medications to see the medications effectiveness on your BP.    Here are some tips that our clinical pharmacists share for home BP monitoring:          Rest 10 minutes before taking your blood pressure.          Don't smoke or drink caffeinated beverages for at least 30 minutes before.          Take your blood pressure before (not after) you eat.          Sit comfortably with your back supported and both feet on the floor (don't cross your legs).          Elevate your arm to heart level on a table or a desk.          Use the proper sized cuff. It should fit smoothly and snugly around your bare upper arm. There should be enough room to slip a fingertip under the cuff. The bottom edge of the cuff should be 1 inch above the crease of the elbow.      Signed, Donato Heinz, MD  10/12/2020 9:14 AM    [place]

## 2020-10-17 ENCOUNTER — Telehealth (HOSPITAL_BASED_OUTPATIENT_CLINIC_OR_DEPARTMENT_OTHER): Payer: Self-pay

## 2020-10-17 NOTE — Telephone Encounter (Signed)
Patient is aware and agreeable. He was very pleased with his progress

## 2020-10-17 NOTE — Telephone Encounter (Signed)
-----   Message from [name] [name], MD sent at 10/13/2020  1:49 PM EDT ----- Hemoglobin A1c is down to 8.1% which is much improved from prior reading of 12.0%.  This is very good and given recent blood sugar readings, I think that continuing with current medication regimen will show further improvement when this is rechecked in about 3 months.  No changes to treatment regimen at this time.

## 2020-10-19 ENCOUNTER — Encounter (INDEPENDENT_AMBULATORY_CARE_PROVIDER_SITE_OTHER): Payer: 59 | Admitting: Ophthalmology

## 2020-10-23 ENCOUNTER — Encounter (INDEPENDENT_AMBULATORY_CARE_PROVIDER_SITE_OTHER): Payer: 59 | Admitting: Ophthalmology

## 2020-10-25 ENCOUNTER — Ambulatory Visit: Payer: Self-pay | Admitting: Nurse Practitioner

## 2020-10-26 ENCOUNTER — Ambulatory Visit (INDEPENDENT_AMBULATORY_CARE_PROVIDER_SITE_OTHER): Payer: 59 | Admitting: Pharmacist Clinician (PhC)/ Clinical Pharmacy Specialist

## 2020-10-26 ENCOUNTER — Other Ambulatory Visit: Payer: Self-pay

## 2020-10-26 VITALS — BP 170/90 | HR 74 | Resp 14 | Ht 66.0 in | Wt 189.7 lb

## 2020-10-26 DIAGNOSIS — I10 Essential (primary) hypertension: Secondary | ICD-10-CM

## 2020-10-26 MED ORDER — OLMESARTAN MEDOXOMIL 20 MG PO TABS: 20.0000 mg | ORAL_TABLET | Freq: Every day | ORAL | 6 refills | Status: DC

## 2020-10-26 NOTE — Progress Notes (Signed)
11/02/2020 [name] 1971/01/28 [id number]   HPI:  Timothy [name] is a 50 y.o. male patient of Dr Gardiner Rhyme, with a PMH below who presents today for hypertension clinic evaluation.  In addition to hypertension his medical history is significant for chronic diastolic heart failure, CKD and DM2 (9/22 A1c 8.1 down from 12.0 in May).  He was scheduled for a renal biopsy earlier this year, but had to reschedule, as his blood pressure was too high.    Today he reports feeling well, and has no blood pressure readings with him.  States compliance with medications  We are somewhat limited by his renal function.  It appears that he took amlodipine-benazepril in the past, but was discontinued during hospitalization in May 2022, most likely due to acute on chronic diastolic heart failure episode.   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)   Home BP readings: notes staying in 160's/ low 100's, no readings with him today  Intolerances: nkda  Labs: 10/12/20:  Na 142, K 5.3, Glu 117, BUN 35, SCr 3.14, GFR 23   Wt Readings from Last 3 Encounters:  10/26/20 189 lb 11.2 oz (86 kg)  10/12/20 186 lb 3.2 oz (84.5 kg)  09/07/20 184 lb (83.5 kg)   BP Readings from Last 3 Encounters:  10/26/20 (!) 170/90  10/12/20 (!) 158/100  09/07/20 (!) 180/110   Pulse Readings from Last 3 Encounters:  10/26/20 74  10/12/20 67  09/07/20 74    Current Outpatient Medications  Medication Sig Dispense Refill   atorvastatin  (LIPITOR) 40 MG tablet Take 1 tablet (40 mg total) by mouth daily. 30 tablet 2   furosemide (LASIX) 40 MG tablet Take 1 tablet (40 mg total) by mouth daily. 90 tablet 3   glucose blood (CONTOUR NEXT TEST) test strip Use as instructed 100 each 12   hydrALAZINE (APRESOLINE) 50 MG tablet Take 1 tablet (50 mg total) by mouth 3 (three) times daily. 270 tablet 3   insulin glargine (LANTUS SOLOSTAR) 100 UNIT/ML Solostar Pen Inject 10 Units into the skin daily. Inject as directed 15 mL 3   Insulin Pen Needle (CAREFINE PEN NEEDLES) 32G X 4 MM MISC 1 applicator by Does not apply route See admin instructions. 50 each 1   isosorbide mononitrate (IMDUR) 30 MG 24 hr tablet Take 1 tablet (30 mg total) by mouth daily. 30 tablet 2   Lancets Micro Thin 33G MISC 1 each by Does not apply route in the morning, at noon, in the evening, and at bedtime. 100 each 12   olmesartan (BENICAR) 20 MG tablet Take 1 tablet (20 mg total) by mouth daily. 30 tablet 6   No current facility-administered medications for this visit.    No Known Allergies  Past Medical History:  Diagnosis Date   CHF (congestive heart failure) (HCC)    Diabetes mellitus without complication (HCC)     Blood pressure (!) 170/90, pulse 74, resp. rate 14, height 5'  6" (1.676 m), weight 189 lb 11.2 oz (86 kg), SpO2 96 %.  Accelerated hypertension Patient with hypertension, hard to treat due to low renal function.  He is currently on losartan, with poor results.  Will have him discontinue and start olmesartan 20 mg instead.  Will repeat metabolic panel in 123456 days.  If he tolerates this without change in kidney function, may be able to increase dose to 40 mg if needed.   We will see him back in 1 month for follow up.     Tommy Medal PharmD CPP [facility] [address] [place], [place] [address] [phone]

## 2020-10-26 NOTE — Patient Instructions (Signed)
Return for a a follow up appointment October 27 at 9 am  Go to the lab 1-2 weeks to check kidney function  Check your blood pressure at home daily and keep record of the readings.  Take your BP meds as follows:  Stop losartan  Start olmesartan 20 mg once daily   Continue with all other medications  Bring all of your meds, your BP cuff and your record of home blood pressures to your next appointment.  Exercise as you're able, try to walk approximately 30 minutes per day.  Keep salt intake to a minimum, especially watch canned and prepared boxed foods.  Eat more fresh fruits and vegetables and fewer canned items.  Avoid eating in fast food restaurants.    HOW TO TAKE YOUR BLOOD PRESSURE: Rest 5 minutes before taking your blood pressure.  Don't smoke or drink caffeinated beverages for at least 30 minutes before. Take your blood pressure before (not after) you eat. Sit comfortably with your back supported and both feet on the floor (don't cross your legs). Elevate your arm to heart level on a table or a desk. Use the proper sized cuff. It should fit smoothly and snugly around your bare upper arm. There should be enough room to slip a fingertip under the cuff. The bottom edge of the cuff should be 1 inch above the crease of the elbow. Ideally, take 3 measurements at one sitting and record the average.

## 2020-10-30 ENCOUNTER — Encounter: Payer: Self-pay | Admitting: Pharmacist Clinician (PhC)/ Clinical Pharmacy Specialist

## 2020-10-30 NOTE — Assessment & Plan Note (Signed)
Patient with hypertension, hard to treat due to low renal function.  He is currently on losartan, with poor results.  Will have him discontinue and start olmesartan 20 mg instead.  Will repeat metabolic panel in 123456 days.  If he tolerates this without change in kidney function, may be able to increase dose to 40 mg if needed.   We will see him back in 1 month for follow up.

## 2020-11-09 ENCOUNTER — Other Ambulatory Visit (HOSPITAL_BASED_OUTPATIENT_CLINIC_OR_DEPARTMENT_OTHER): Payer: Self-pay | Admitting: Family Medicine

## 2020-11-09 ENCOUNTER — Other Ambulatory Visit: Payer: Self-pay

## 2020-11-09 ENCOUNTER — Ambulatory Visit (INDEPENDENT_AMBULATORY_CARE_PROVIDER_SITE_OTHER): Payer: 59 | Admitting: Family Medicine

## 2020-11-09 VITALS — BP 179/104 | HR 82 | Ht 66.0 in | Wt 193.6 lb

## 2020-11-09 DIAGNOSIS — E1122 Type 2 diabetes mellitus with diabetic chronic kidney disease: Secondary | ICD-10-CM

## 2020-11-09 DIAGNOSIS — N184 Chronic kidney disease, stage 4 (severe): Secondary | ICD-10-CM | POA: Diagnosis not present

## 2020-11-09 DIAGNOSIS — I10 Essential (primary) hypertension: Secondary | ICD-10-CM | POA: Diagnosis not present

## 2020-11-09 DIAGNOSIS — S8991XD Unspecified injury of right lower leg, subsequent encounter: Secondary | ICD-10-CM

## 2020-11-09 NOTE — Assessment & Plan Note (Addendum)
Most recent hemoglobin A1c 8.1% which is much improved from 12.0% Continues with Lantus 17 units daily, reports fasting blood sugars are 80s to 90s Denies any symptoms of hypoglycemia Continue with current dosing of Lantus, continue with monitoring of fasting blood sugar Plan to recheck hemoglobin A1c in 2 months

## 2020-11-09 NOTE — Progress Notes (Signed)
    Procedures performed today:    None.  Independent interpretation of notes and tests performed by another provider:   None.  Brief History, Exam, Impression, and Recommendations:    BP (!) 179/104   Pulse 82   Ht '5\' 6"'$  (1.676 m)   Wt 193 lb 9.6 oz (87.8 kg)   SpO2 97%   BMI 31.25 kg/m   Accelerated hypertension Continue difficulties with controlling blood pressure, remains elevated in office today Continues follow-up with nephrology as well as cardiology Will be seeing nephrology later this morning.  Next visit with cardiology is in a few weeks Most recent visit with cardiology indicated change of medications from losartan to olmesartan, however patient reports that he has continued with taking losartan, Lasix, amlodipine, hydralazine Reports some ongoing orthopnea, feels short of breath when lying down to sleep at night and this has impacted his sleep Denies significant lower extremity swelling Advised that when he meets with nephrology later today to make them aware of his current symptoms as well as current medications, indicates that he will be having labs done with nephrology today thus we will not do any in our clinic at this time Continue with current medications Recommend DASH diet  Type 2 diabetes mellitus ([place]) Most recent hemoglobin A1c 8.1% which is much improved from 12.0% Continues with Lantus 17 units daily, reports fasting blood sugars are 80s to 90s Denies any symptoms of hypoglycemia Continue with current dosing of Lantus, continue with monitoring of fasting blood sugar Plan to recheck hemoglobin A1c in 2 months  Knee injury, right, subsequent encounter Previously placed order for x-ray, patient has not completed as of yet, patient has not been contacted by imaging center and reports not being sure where to go Provided information regarding [facility] and getting imaging completed Next steps pending results of x-rays, likely referral to orthopedic  surgeon  Plan for follow-up in 1 month to monitor progress, review labs, review recommendations from specialists   ___________________________________________ Timothy Joubert [name], MD, ABFM, CAQSM Primary Care and [place]

## 2020-11-09 NOTE — Assessment & Plan Note (Signed)
Previously placed order for x-ray, patient has not completed as of yet, patient has not been contacted by imaging center and reports not being sure where to go Provided information regarding [facility] imaging and getting imaging completed Next steps pending results of x-rays, likely referral to orthopedic surgeon

## 2020-11-09 NOTE — Assessment & Plan Note (Signed)
Continue difficulties with controlling blood pressure, remains elevated in office today Continues follow-up with nephrology as well as cardiology Will be seeing nephrology later this morning.  Next visit with cardiology is in a few weeks Most recent visit with cardiology indicated change of medications from losartan to olmesartan, however patient reports that he has continued with taking losartan, Lasix, amlodipine, hydralazine Reports some ongoing orthopnea, feels short of breath when lying down to sleep at night and this has impacted his sleep Denies significant lower extremity swelling Advised that when he meets with nephrology later today to make them aware of his current symptoms as well as current medications, indicates that he will be having labs done with nephrology today thus we will not do any in our clinic at this time Continue with current medications Recommend DASH diet

## 2020-11-13 ENCOUNTER — Other Ambulatory Visit (HOSPITAL_BASED_OUTPATIENT_CLINIC_OR_DEPARTMENT_OTHER): Payer: Self-pay | Admitting: Family Medicine

## 2020-11-14 ENCOUNTER — Other Ambulatory Visit: Payer: Self-pay

## 2020-11-14 ENCOUNTER — Encounter (HOSPITAL_COMMUNITY): Payer: Self-pay

## 2020-11-14 ENCOUNTER — Emergency Department (HOSPITAL_COMMUNITY): Payer: Medicare Other

## 2020-11-14 ENCOUNTER — Inpatient Hospital Stay (HOSPITAL_COMMUNITY)
Admission: EM | Admit: 2020-11-14 | Discharge: 2020-11-18 | DRG: 194 | Disposition: A | Discharge status: Home / Self Care | Payer: Medicare Other | Attending: Internal Medicine | Admitting: Internal Medicine

## 2020-11-14 DIAGNOSIS — E1122 Type 2 diabetes mellitus with diabetic chronic kidney disease: Secondary | ICD-10-CM | POA: Diagnosis present

## 2020-11-14 DIAGNOSIS — E861 Hypovolemia: Secondary | ICD-10-CM | POA: Diagnosis not present

## 2020-11-14 DIAGNOSIS — R778 Other specified abnormalities of plasma proteins: Secondary | ICD-10-CM | POA: Diagnosis present

## 2020-11-14 DIAGNOSIS — Z79899 Other long term (current) drug therapy: Secondary | ICD-10-CM

## 2020-11-14 DIAGNOSIS — Z794 Long term (current) use of insulin: Secondary | ICD-10-CM

## 2020-11-14 DIAGNOSIS — E119 Type 2 diabetes mellitus without complications: Secondary | ICD-10-CM

## 2020-11-14 DIAGNOSIS — R042 Hemoptysis: Secondary | ICD-10-CM | POA: Diagnosis present

## 2020-11-14 DIAGNOSIS — J189 Pneumonia, unspecified organism: Principal | ICD-10-CM | POA: Diagnosis present

## 2020-11-14 DIAGNOSIS — N184 Chronic kidney disease, stage 4 (severe): Secondary | ICD-10-CM | POA: Diagnosis present

## 2020-11-14 DIAGNOSIS — E669 Obesity, unspecified: Secondary | ICD-10-CM | POA: Diagnosis present

## 2020-11-14 DIAGNOSIS — I130 Hypertensive heart and chronic kidney disease with heart failure and stage 1 through stage 4 chronic kidney disease, or unspecified chronic kidney disease: Secondary | ICD-10-CM | POA: Diagnosis present

## 2020-11-14 DIAGNOSIS — Z6831 Body mass index (BMI) 31.0-31.9, adult: Secondary | ICD-10-CM

## 2020-11-14 DIAGNOSIS — N179 Acute kidney failure, unspecified: Secondary | ICD-10-CM | POA: Diagnosis present

## 2020-11-14 DIAGNOSIS — D509 Iron deficiency anemia, unspecified: Secondary | ICD-10-CM | POA: Diagnosis present

## 2020-11-14 DIAGNOSIS — D631 Anemia in chronic kidney disease: Secondary | ICD-10-CM | POA: Diagnosis present

## 2020-11-14 DIAGNOSIS — I10 Essential (primary) hypertension: Secondary | ICD-10-CM | POA: Diagnosis present

## 2020-11-14 DIAGNOSIS — Z20822 Contact with and (suspected) exposure to covid-19: Secondary | ICD-10-CM | POA: Diagnosis present

## 2020-11-14 DIAGNOSIS — I5032 Chronic diastolic (congestive) heart failure: Secondary | ICD-10-CM | POA: Diagnosis present

## 2020-11-14 DIAGNOSIS — R7989 Other specified abnormal findings of blood chemistry: Secondary | ICD-10-CM | POA: Diagnosis present

## 2020-11-14 LAB — BASIC METABOLIC PANEL
Anion gap: 6 (ref 5–15)
BUN: 33 mg/dL — ABNORMAL HIGH (ref 6–20)
CO2: 23 mmol/L (ref 22–32)
Calcium: 8.7 mg/dL — ABNORMAL LOW (ref 8.9–10.3)
Chloride: 110 mmol/L (ref 98–111)
Creatinine, Ser: 3.56 mg/dL — ABNORMAL HIGH (ref 0.61–1.24)
GFR, Estimated: 20 mL/min — ABNORMAL LOW (ref >60)
Glucose, Bld: 136 mg/dL — ABNORMAL HIGH (ref 70–99)
Potassium: 4.5 mmol/L (ref 3.5–5.1)
Sodium: 139 mmol/L (ref 135–145)

## 2020-11-14 LAB — HEPATIC FUNCTION PANEL
ALT: 20 U/L (ref 0–44)
AST: 24 U/L (ref 15–41)
Albumin: 3.2 g/dL — ABNORMAL LOW (ref 3.5–5.0)
Alkaline Phosphatase: 54 U/L (ref 38–126)
Bilirubin, Direct: <0.1 mg/dL (ref 0.0–0.2)
Total Bilirubin: 0.9 mg/dL (ref 0.3–1.2)
Total Protein: 7.7 g/dL (ref 6.5–8.1)

## 2020-11-14 LAB — CBC
HCT: 32.5 % — ABNORMAL LOW (ref 39.0–52.0)
Hemoglobin: 11 g/dL — ABNORMAL LOW (ref 13.0–17.0)
MCH: 26.2 pg (ref 26.0–34.0)
MCHC: 33.8 g/dL (ref 30.0–36.0)
MCV: 77.4 fL — ABNORMAL LOW (ref 80.0–100.0)
Platelets: 320 10*3/uL (ref 150–400)
RBC: 4.2 MIL/uL — ABNORMAL LOW (ref 4.22–5.81)
RDW: 14.5 % (ref 11.5–15.5)
WBC: 10 10*3/uL (ref 4.0–10.5)
nRBC: 0 % (ref 0.0–0.2)

## 2020-11-14 LAB — BRAIN NATRIURETIC PEPTIDE: B Natriuretic Peptide: 243.3 pg/mL — ABNORMAL HIGH (ref 0.0–100.0)

## 2020-11-14 LAB — TROPONIN I (HIGH SENSITIVITY): Troponin I (High Sensitivity): 30 ng/L — ABNORMAL HIGH (ref <18)

## 2020-11-14 LAB — D-DIMER, QUANTITATIVE: D-Dimer, Quant: 0.86 ug/mL-FEU — ABNORMAL HIGH (ref 0.00–0.50)

## 2020-11-14 LAB — PROTIME-INR
INR: 1.1 (ref 0.8–1.2)
Prothrombin Time: 13.9 seconds (ref 11.4–15.2)

## 2020-11-14 NOTE — ED Triage Notes (Signed)
Pt complains of SHOB, vomiting up blood, and back pain since this morning.

## 2020-11-14 NOTE — ED Provider Notes (Signed)
Emergency Medicine Provider Triage Evaluation Note  Timothy [name] , a 50 y.o. male  was evaluated in triage.  Pt complains of back pain shortness of breath fatigue fatigue all of his symptoms began today.  Patient states that he has also had nausea and vomiting today.  Initially described throwing up blood however it seems that he has more having hemoptysis.  He denies any history of VTE no lower extremity pain or swelling per patient.  Denies any history of cancer.  States he is not on blood thinners.  States he does have a history of CHF.  Denies any pink or frothy sputum.  Denies any recent long travel or unilateral leg pain.  Review of Systems  Positive: SOB, cough, hemoptysis, vomiting, back pain Negative: Fever  Physical Exam  BP (!) 171/94 (BP Location: Left Arm)   Pulse 89   Temp 98 F (36.7 C) (Oral)   Resp (!) 25   Ht '5\' 6"'$  (1.676 m)   Wt 86.2 kg   SpO2 97%   BMI 30.67 kg/m  Gen:   Awake, uncomfortable Resp:  Tachypneic, speaking full sentences, crackles in left base, trachea midline MSK:   Moves extremities without difficulty  Other:  No lower extremity edema.  Medical Decision Making  Medically screening exam initiated at 9:38 PM.  Appropriate orders placed.  Timothy [name] was informed that the remainder of the evaluation will be completed by another provider, this initial triage assessment does not replace that evaluation, and the importance of remaining in the ED until their evaluation is complete.  Patient will need to be moved expediently to major care.   Timothy [name], [place] 11/14/20 2143    Carmin Muskrat, MD 11/15/20 [phone]

## 2020-11-14 NOTE — ED Provider Notes (Signed)
[facility] DEPT Provider Note   CSN: [id number] Arrival date & time: 11/14/20  1921     History Chief Complaint  Patient presents with   Shortness of Breath   Hematemesis   Back Pain    Timothy [name] is a 50 y.o. male.  The history is provided by the patient and medical records.  Shortness of Breath Back Pain Timothy [name] is a 50 y.o. male who presents to the Emergency Department complaining of hemoptysis, back pain.  He started getting sick this morning with hemoptysis, dark red about the size of a teaspoon, possibly 6-7 episodes.  Later in the afternoon he developed emesis (looked like emesis).  This morning he developed sharp low back pains (bilateral).  No fever.  Has sob.  No abdominal pain.  Has a hx/o DM, CHF, CKD.  No hx/o DVT/PE. Symptoms are severe, constant nature.    Past Medical History:  Diagnosis Date   CHF (congestive heart failure) ([place])    Diabetes mellitus without complication ([place])     Patient Active Problem List   Diagnosis Date Noted   Pneumonia 11/15/2020   Hemoptysis 11/15/2020   CKD (chronic kidney disease), stage IV ([place]) 11/15/2020   Positive D dimer 11/15/2020   Knee injury, right, subsequent encounter 09/07/2020   Type 2 diabetes mellitus ([place]) 08/22/2020   Acute respiratory failure with hypoxia ([place]) 06/22/2020   Multifocal pneumonia 06/22/2020   Accelerated hypertension 06/20/2019   Elevated troponin 06/19/2019   Chronic diastolic CHF (congestive heart failure) ([place]) 06/19/2019   Hypoalbuminemia 06/19/2019   Hypertensive urgency 06/19/2019   AKI (acute kidney injury) ([place]) 06/19/2019   CHF (congestive heart failure) ([place]) 06/19/2019    Past Surgical History:  Procedure Laterality Date   EYE SURGERY         History reviewed. No pertinent family history.  Social History   Tobacco Use   Smoking status: Never   Smokeless tobacco: Never  Vaping Use   Vaping Use: Never used   Substance Use Topics   Alcohol use: Not Currently   Drug use: Not Currently    Home Medications Prior to Admission medications   Medication Sig Start Date End Date Taking? Authorizing Provider  amLODipine (NORVASC) 5 MG tablet Take 5 mg by mouth daily. 10/21/20  Yes [provider]  furosemide (LASIX) 80 MG tablet Take 80 mg by mouth daily.   Yes [provider]  glucose blood (CONTOUR NEXT TEST) test strip Use as instructed 07/26/20  Yes Donato Heinz, MD  hydrALAZINE (APRESOLINE) 50 MG tablet Take 1 tablet (50 mg total) by mouth 3 (three) times daily. 07/05/20 06/30/21 Yes Donato Heinz, MD  hydrOXYzine (VISTARIL) 25 MG capsule Take 25 mg by mouth at bedtime. 11/01/20  Yes [provider]  insulin glargine (LANTUS SOLOSTAR) 100 UNIT/ML Solostar Pen Inject 10 Units into the skin daily. Inject as directed 08/22/20  Yes [name], [name], MD  Insulin Pen Needle (CAREFINE PEN NEEDLES) 32G X 4 MM MISC 1 applicator by Does not apply route See admin instructions. 08/22/20  Yes [name], [name], MD  isosorbide mononitrate (IMDUR) 30 MG 24 hr tablet Take 1 tablet (30 mg total) by mouth daily. 07/26/20 12/06/21 Yes Donato Heinz, MD  Lancets Micro Thin 33G MISC 1 each by Does not apply route in the morning, at noon, in the evening, and at bedtime. 07/26/20  Yes Donato Heinz, MD  metoprolol succinate (TOPROL-XL) 25  MG 24 hr tablet Take 25 mg by mouth daily. 11/02/20  Yes [provider]  atorvastatin (LIPITOR) 40 MG tablet Take 1 tablet (40 mg total) by mouth daily. Patient not taking: No sig reported 07/26/20 11/15/21  Donato Heinz, MD  furosemide (LASIX) 40 MG tablet TAKE 1 TABLET(40 MG) BY MOUTH DAILY Patient not taking: Reported on 11/15/2020 11/13/20   [name], [name], MD    Allergies    Patient has no known allergies.  Review of Systems   Review of Systems  Respiratory:  Positive for shortness of breath.    Musculoskeletal:  Positive for back pain.  All other systems reviewed and are negative.  Physical Exam Updated Vital Signs BP 128/89   Pulse 79   Temp 98 F (36.7 C) (Oral)   Resp 16   Ht '5\' 6"'$  (1.676 m)   Wt 86.2 kg   SpO2 98%   BMI 30.67 kg/m   Physical Exam Vitals and nursing note reviewed.  Constitutional:      Appearance: He is well-developed.  HENT:     Head: Normocephalic and atraumatic.  Cardiovascular:     Rate and Rhythm: Normal rate and regular rhythm.     Heart sounds: No murmur heard. Pulmonary:     Effort: Pulmonary effort is normal. No respiratory distress.     Breath sounds: Normal breath sounds.  Abdominal:     Palpations: Abdomen is soft.     Tenderness: There is no abdominal tenderness. There is no guarding or rebound.  Musculoskeletal:        General: No swelling or tenderness.     Comments: 2+ DP pulses bilaterally  Skin:    General: Skin is warm and dry.  Neurological:     Mental Status: He is alert and oriented to person, place, and time.  Psychiatric:        Behavior: Behavior normal.    ED Results / Procedures / Treatments   Labs (all labs ordered are listed, but only abnormal results are displayed) Labs Reviewed  BASIC METABOLIC PANEL - Abnormal; Notable for the following components:      Result Value   Glucose, Bld 136 (*)    BUN 33 (*)    Creatinine, Ser 3.56 (*)    Calcium 8.7 (*)    GFR, Estimated 20 (*)    All other components within normal limits  CBC - Abnormal; Notable for the following components:   RBC 4.20 (*)    Hemoglobin 11.0 (*)    HCT 32.5 (*)    MCV 77.4 (*)    All other components within normal limits  HEPATIC FUNCTION PANEL - Abnormal; Notable for the following components:   Albumin 3.2 (*)    All other components within normal limits  BRAIN NATRIURETIC PEPTIDE - Abnormal; Notable for the following components:   B Natriuretic Peptide 243.3 (*)    All other components within normal limits  D-DIMER,  QUANTITATIVE - Abnormal; Notable for the following components:   D-Dimer, Quant 0.86 (*)    All other components within normal limits  URINALYSIS, ROUTINE W REFLEX MICROSCOPIC - Abnormal; Notable for the following components:   Glucose, UA 50 (*)    Hgb urine dipstick SMALL (*)    Protein, ur >=300 (*)    Bacteria, UA RARE (*)    All other components within normal limits  BASIC METABOLIC PANEL - Abnormal; Notable for the following components:   Glucose, Bld 132 (*)  BUN 31 (*)    Creatinine, Ser 2.91 (*)    Calcium 7.9 (*)    GFR, Estimated 26 (*)    All other components within normal limits  CBC - Abnormal; Notable for the following components:   RBC 3.74 (*)    Hemoglobin 9.7 (*)    HCT 29.2 (*)    MCV 78.1 (*)    MCH 25.9 (*)    All other components within normal limits  TROPONIN I (HIGH SENSITIVITY) - Abnormal; Notable for the following components:   Troponin I (High Sensitivity) 30 (*)    All other components within normal limits  TROPONIN I (HIGH SENSITIVITY) - Abnormal; Notable for the following components:   Troponin I (High Sensitivity) 30 (*)    All other components within normal limits  RESP PANEL BY RT-PCR (FLU A&B, COVID) ARPGX2  EXPECTORATED SPUTUM ASSESSMENT W GRAM STAIN, RFLX TO RESP C  PROTIME-INR  STREP PNEUMONIAE URINARY ANTIGEN  PROCALCITONIN  PROCALCITONIN  LEGIONELLA PNEUMOPHILA SEROGP 1 UR AG  I-STAT CHEM 8, ED  TYPE AND SCREEN  ABO/RH    EKG EKG Interpretation  Date/Time:  Tuesday November 14 2020 21:18:48 EDT Ventricular Rate:  81 PR Interval:  169 QRS Duration: 82 QT Interval:  391 QTC Calculation: 454 R Axis:   84 Text Interpretation: Sinus rhythm Probable left atrial enlargement Left ventricular hypertrophy Anterior Q waves, possibly due to LVH Borderline T abnormalities, inferior leads Confirmed by Quintella Reichert [phone]) on 11/14/2020 11:24:40 PM  Radiology DG Chest 2 View  Result Date: 11/14/2020 CLINICAL DATA:  Back pain EXAM:  CHEST - 2 VIEW COMPARISON:  06/22/2020 FINDINGS: Mild left basilar opacity. No pleural effusion or pneumothorax. Cardiomediastinal contours are normal. IMPRESSION: Mild left basilar opacity, possibly developing infection. Electronically Signed   By: Ulyses Jarred M.D.   On: 11/14/2020 22:15   CT Renal Stone Study  Result Date: 11/15/2020 CLINICAL DATA:  Bilateral flank pain, acute renal insufficiency EXAM: CT ABDOMEN AND PELVIS WITHOUT CONTRAST TECHNIQUE: Multidetector CT imaging of the abdomen and pelvis was performed following the standard protocol without IV contrast. COMPARISON:  None. FINDINGS: Lower chest: There is multifocal pulmonary infiltrate and consolidation within the visualized lung bases, most severe within the left lower lobe, in keeping with aspiration or multifocal infection. No pleural effusion. Visualized heart and pericardium are unremarkable. Hepatobiliary: No focal liver abnormality is seen. No gallstones, gallbladder wall thickening, or biliary dilatation. Pancreas: Unremarkable Spleen: Unremarkable Adrenals/Urinary Tract: Adrenal glands are unremarkable. Kidneys are normal, without renal calculi, focal lesion, or hydronephrosis. Bladder is unremarkable. Stomach/Bowel: Stomach is within normal limits. Appendix appears normal. No evidence of bowel wall thickening, distention, or inflammatory changes. No free intraperitoneal gas or fluid. Vascular/Lymphatic: Aortic atherosclerosis. No enlarged abdominal or pelvic lymph nodes. Reproductive: Prostate is unremarkable. Other: Tiny fat containing umbilical and right inguinal hernias are present. Rectum unremarkable. Musculoskeletal: No acute or significant osseous findings. IMPRESSION: Extensive bibasilar pulmonary infiltrate consolidation, most severe within the left lower lobe, in keeping with multifocal infection or aspiration. No acute intra-abdominal pathology identified. Aortic Atherosclerosis (ICD10-I70.0). Electronically Signed   By:  Fidela Salisbury M.D.   On: 11/15/2020 00:06    Procedures Procedures   Medications Ordered in ED Medications  amLODipine (NORVASC) tablet 5 mg (has no administration in time range)  hydrALAZINE (APRESOLINE) tablet 50 mg (has no administration in time range)  isosorbide mononitrate (IMDUR) 24 hr tablet 30 mg (has no administration in time range)  metoprolol succinate (TOPROL-XL) 24 hr tablet 25  mg (has no administration in time range)  insulin aspart (novoLOG) injection 0-6 Units (has no administration in time range)  insulin aspart (novoLOG) injection 2 Units (has no administration in time range)  cefTRIAXone (ROCEPHIN) 2 g in sodium chloride 0.9 % 100 mL IVPB (has no administration in time range)  doxycycline (VIBRAMYCIN) 100 mg in sodium chloride 0.9 % 250 mL IVPB (has no administration in time range)  acetaminophen (TYLENOL) tablet 650 mg (has no administration in time range)    Or  acetaminophen (TYLENOL) suppository 650 mg (has no administration in time range)  ondansetron (ZOFRAN) tablet 4 mg ( Oral See Alternative 11/15/20 0224)    Or  ondansetron (ZOFRAN) injection 4 mg (4 mg Intravenous Given 11/15/20 0224)  hydrOXYzine (ATARAX/VISTARIL) tablet 25 mg (has no administration in time range)  cefTRIAXone (ROCEPHIN) 1 g in sodium chloride 0.9 % 100 mL IVPB (0 g Intravenous Stopped 11/15/20 0153)  doxycycline (VIBRA-TABS) tablet 100 mg (100 mg Oral Given 11/15/20 0122)    ED Course  I have reviewed the triage vital signs and the nursing notes.  Pertinent labs & imaging results that were available during my care of the patient were reviewed by me and considered in my medical decision making (see chart for details).    MDM Rules/Calculators/A&P                          patient here for evaluation of hemoptysis, back pain. Patient is non-toxic appearing on evaluation without respiratory distress. Chest x-ray with possible infiltrate, will start on antibiotics for community acquired  pneumonia. Given his back pain a CT stone study was obtained, which is negative for obstructing stone but is consistent with multifocal pneumonia. Given reports of hemoptysis, recent admission a D dimer was sent, which is elevated. Labs with worsening renal function when compared to baseline. Patient is not a candidate for CTA secondary to his renal function. He will need a VQ scan for further workup. Discussed with patient findings of studies with multifocal pneumonia and recommendation for admission and he is in agreement with plan. Hospitalist consulted for admission.  Final Clinical Impression(s) / ED Diagnoses Final diagnoses:  None    Rx / DC Orders ED Discharge Orders     None        Quintella Reichert, MD 11/15/20 [phone]

## 2020-11-15 ENCOUNTER — Inpatient Hospital Stay (HOSPITAL_COMMUNITY): Payer: Medicare Other

## 2020-11-15 ENCOUNTER — Encounter (HOSPITAL_COMMUNITY): Payer: Self-pay | Admitting: Family Medicine

## 2020-11-15 DIAGNOSIS — R7989 Other specified abnormal findings of blood chemistry: Secondary | ICD-10-CM | POA: Diagnosis not present

## 2020-11-15 DIAGNOSIS — N184 Chronic kidney disease, stage 4 (severe): Secondary | ICD-10-CM | POA: Diagnosis present

## 2020-11-15 DIAGNOSIS — N179 Acute kidney failure, unspecified: Secondary | ICD-10-CM | POA: Diagnosis present

## 2020-11-15 DIAGNOSIS — Z6831 Body mass index (BMI) 31.0-31.9, adult: Secondary | ICD-10-CM | POA: Diagnosis not present

## 2020-11-15 DIAGNOSIS — E1122 Type 2 diabetes mellitus with diabetic chronic kidney disease: Secondary | ICD-10-CM

## 2020-11-15 DIAGNOSIS — D509 Iron deficiency anemia, unspecified: Secondary | ICD-10-CM | POA: Diagnosis present

## 2020-11-15 DIAGNOSIS — J189 Pneumonia, unspecified organism: Secondary | ICD-10-CM | POA: Diagnosis present

## 2020-11-15 DIAGNOSIS — R778 Other specified abnormalities of plasma proteins: Secondary | ICD-10-CM

## 2020-11-15 DIAGNOSIS — R042 Hemoptysis: Secondary | ICD-10-CM | POA: Diagnosis present

## 2020-11-15 DIAGNOSIS — Z79899 Other long term (current) drug therapy: Secondary | ICD-10-CM | POA: Diagnosis not present

## 2020-11-15 DIAGNOSIS — I5032 Chronic diastolic (congestive) heart failure: Secondary | ICD-10-CM

## 2020-11-15 DIAGNOSIS — E669 Obesity, unspecified: Secondary | ICD-10-CM | POA: Diagnosis present

## 2020-11-15 DIAGNOSIS — E861 Hypovolemia: Secondary | ICD-10-CM | POA: Diagnosis not present

## 2020-11-15 DIAGNOSIS — I10 Essential (primary) hypertension: Secondary | ICD-10-CM

## 2020-11-15 DIAGNOSIS — I130 Hypertensive heart and chronic kidney disease with heart failure and stage 1 through stage 4 chronic kidney disease, or unspecified chronic kidney disease: Secondary | ICD-10-CM | POA: Diagnosis present

## 2020-11-15 DIAGNOSIS — Z20822 Contact with and (suspected) exposure to covid-19: Secondary | ICD-10-CM | POA: Diagnosis present

## 2020-11-15 DIAGNOSIS — Z794 Long term (current) use of insulin: Secondary | ICD-10-CM

## 2020-11-15 DIAGNOSIS — D631 Anemia in chronic kidney disease: Secondary | ICD-10-CM | POA: Diagnosis present

## 2020-11-15 LAB — URINALYSIS, ROUTINE W REFLEX MICROSCOPIC
Bilirubin Urine: NEGATIVE
Glucose, UA: 50 mg/dL — AB
Ketones, ur: NEGATIVE mg/dL
Leukocytes,Ua: NEGATIVE
Nitrite: NEGATIVE
Protein, ur: >=300 mg/dL — AB
Specific Gravity, Urine: 1.012 (ref 1.005–1.030)
pH: 6 (ref 5.0–8.0)

## 2020-11-15 LAB — BASIC METABOLIC PANEL
Anion gap: 7 (ref 5–15)
BUN: 31 mg/dL — ABNORMAL HIGH (ref 6–20)
CO2: 23 mmol/L (ref 22–32)
Calcium: 7.9 mg/dL — ABNORMAL LOW (ref 8.9–10.3)
Chloride: 106 mmol/L (ref 98–111)
Creatinine, Ser: 2.91 mg/dL — ABNORMAL HIGH (ref 0.61–1.24)
GFR, Estimated: 26 mL/min — ABNORMAL LOW (ref >60)
Glucose, Bld: 132 mg/dL — ABNORMAL HIGH (ref 70–99)
Potassium: 4.6 mmol/L (ref 3.5–5.1)
Sodium: 136 mmol/L (ref 135–145)

## 2020-11-15 LAB — RESP PANEL BY RT-PCR (FLU A&B, COVID) ARPGX2
Influenza A by PCR: NEGATIVE
Influenza B by PCR: NEGATIVE
SARS Coronavirus 2 by RT PCR: NEGATIVE

## 2020-11-15 LAB — CBG MONITORING, ED
Glucose-Capillary: 113 mg/dL — ABNORMAL HIGH (ref 70–99)
Glucose-Capillary: 77 mg/dL (ref 70–99)

## 2020-11-15 LAB — TYPE AND SCREEN
ABO/RH(D): B POS
Antibody Screen: NEGATIVE

## 2020-11-15 LAB — CBC
HCT: 29.2 % — ABNORMAL LOW (ref 39.0–52.0)
Hemoglobin: 9.7 g/dL — ABNORMAL LOW (ref 13.0–17.0)
MCH: 25.9 pg — ABNORMAL LOW (ref 26.0–34.0)
MCHC: 33.2 g/dL (ref 30.0–36.0)
MCV: 78.1 fL — ABNORMAL LOW (ref 80.0–100.0)
Platelets: 282 10*3/uL (ref 150–400)
RBC: 3.74 MIL/uL — ABNORMAL LOW (ref 4.22–5.81)
RDW: 14.7 % (ref 11.5–15.5)
WBC: 9.2 10*3/uL (ref 4.0–10.5)
nRBC: 0 % (ref 0.0–0.2)

## 2020-11-15 LAB — TROPONIN I (HIGH SENSITIVITY): Troponin I (High Sensitivity): 30 ng/L — ABNORMAL HIGH (ref <18)

## 2020-11-15 LAB — GLUCOSE, CAPILLARY
Glucose-Capillary: 152 mg/dL — ABNORMAL HIGH (ref 70–99)
Glucose-Capillary: 159 mg/dL — ABNORMAL HIGH (ref 70–99)

## 2020-11-15 LAB — ABO/RH: ABO/RH(D): B POS

## 2020-11-15 LAB — STREP PNEUMONIAE URINARY ANTIGEN: Strep Pneumo Urinary Antigen: NEGATIVE

## 2020-11-15 LAB — PROCALCITONIN
Procalcitonin: 5.62 ng/mL
Procalcitonin: 5.11 ng/mL

## 2020-11-15 MED ORDER — SODIUM CHLORIDE 0.9 % IV SOLN
2.0000 g | INTRAVENOUS | Status: DC
  Administered 2020-11-16 – 2020-11-18 (×3): 2 g via INTRAVENOUS
  Filled 2020-11-15 (×3): qty 20

## 2020-11-15 MED ORDER — DOXYCYCLINE HYCLATE 100 MG PO TABS
100.0000 mg | ORAL_TABLET | Freq: Once | ORAL | Status: AC
  Administered 2020-11-15: 100 mg via ORAL
  Filled 2020-11-15: qty 1

## 2020-11-15 MED ORDER — HYDRALAZINE HCL 50 MG PO TABS
50.0000 mg | ORAL_TABLET | Freq: Three times a day (TID) | ORAL | Status: DC
  Administered 2020-11-15 – 2020-11-18 (×10): 50 mg via ORAL
  Filled 2020-11-15 (×7): qty 1
  Filled 2020-11-15: qty 2
  Filled 2020-11-15 (×2): qty 1

## 2020-11-15 MED ORDER — SODIUM CHLORIDE 0.9 % IV SOLN
1.0000 g | Freq: Once | INTRAVENOUS | Status: AC
  Administered 2020-11-15: 1 g via INTRAVENOUS
  Filled 2020-11-15: qty 10

## 2020-11-15 MED ORDER — INSULIN ASPART 100 UNIT/ML IJ SOLN
2.0000 [IU] | Freq: Three times a day (TID) | INTRAMUSCULAR | Status: DC
  Administered 2020-11-15 – 2020-11-18 (×8): 2 [IU] via SUBCUTANEOUS
  Filled 2020-11-15: qty 0.02

## 2020-11-15 MED ORDER — TECHNETIUM TO 99M ALBUMIN AGGREGATED
4.3000 | Freq: Once | INTRAVENOUS | Status: AC
  Administered 2020-11-15: 4.3 via INTRAVENOUS

## 2020-11-15 MED ORDER — ACETAMINOPHEN 650 MG RE SUPP: 650.0000 mg | Freq: Four times a day (QID) | RECTAL | Status: DC | PRN

## 2020-11-15 MED ORDER — ONDANSETRON HCL 4 MG PO TABS: 4.0000 mg | ORAL_TABLET | Freq: Four times a day (QID) | ORAL | Status: DC | PRN

## 2020-11-15 MED ORDER — ISOSORBIDE MONONITRATE ER 30 MG PO TB24
30.0000 mg | ORAL_TABLET | Freq: Every day | ORAL | Status: DC
  Administered 2020-11-15 – 2020-11-17 (×3): 30 mg via ORAL
  Filled 2020-11-15 (×4): qty 1

## 2020-11-15 MED ORDER — SODIUM CHLORIDE 0.9 % IV SOLN
100.0000 mg | Freq: Two times a day (BID) | INTRAVENOUS | Status: DC
  Administered 2020-11-15 – 2020-11-17 (×5): 100 mg via INTRAVENOUS
  Filled 2020-11-15 (×6): qty 100

## 2020-11-15 MED ORDER — INSULIN ASPART 100 UNIT/ML IJ SOLN
0.0000 [IU] | Freq: Three times a day (TID) | INTRAMUSCULAR | Status: DC
Start: 2020-11-15 — End: 2020-11-18
  Administered 2020-11-15 – 2020-11-18 (×6): 1 [IU] via SUBCUTANEOUS
  Filled 2020-11-15: qty 0.06

## 2020-11-15 MED ORDER — ONDANSETRON HCL 4 MG/2ML IJ SOLN
4.0000 mg | Freq: Four times a day (QID) | INTRAMUSCULAR | Status: DC | PRN
  Administered 2020-11-15 – 2020-11-17 (×7): 4 mg via INTRAVENOUS
  Filled 2020-11-15 (×7): qty 2

## 2020-11-15 MED ORDER — HYDROXYZINE HCL 25 MG PO TABS
25.0000 mg | ORAL_TABLET | Freq: Every day | ORAL | Status: DC
  Administered 2020-11-16: 25 mg via ORAL
  Filled 2020-11-15 (×2): qty 1

## 2020-11-15 MED ORDER — ACETAMINOPHEN 325 MG PO TABS
650.0000 mg | ORAL_TABLET | Freq: Four times a day (QID) | ORAL | Status: DC | PRN
  Administered 2020-11-15: 650 mg via ORAL
  Filled 2020-11-15: qty 2

## 2020-11-15 MED ORDER — METOPROLOL SUCCINATE ER 25 MG PO TB24
25.0000 mg | ORAL_TABLET | Freq: Every day | ORAL | Status: DC
  Administered 2020-11-15 – 2020-11-18 (×4): 25 mg via ORAL
  Filled 2020-11-15 (×4): qty 1

## 2020-11-15 MED ORDER — AMLODIPINE BESYLATE 5 MG PO TABS
5.0000 mg | ORAL_TABLET | Freq: Every day | ORAL | Status: DC
  Administered 2020-11-15 – 2020-11-18 (×4): 5 mg via ORAL
  Filled 2020-11-15 (×4): qty 1

## 2020-11-15 MED ORDER — HYDROXYZINE PAMOATE 25 MG PO CAPS: 25.0000 mg | ORAL_CAPSULE | Freq: Every day | ORAL | Status: DC

## 2020-11-15 NOTE — ED Notes (Signed)
RT notified of incentive spirometry order. RT requested nursing to do teaching.

## 2020-11-15 NOTE — ED Notes (Signed)
Dr. Opyd at bedside  

## 2020-11-15 NOTE — H&P (Signed)
History and Physical    Timothy [name] [id number] DOB: Jul 13, 1970 DOA: 11/14/2020  PCP: [name], [name], MD   Patient coming from: Home   Chief Complaint: Cough with hemoptysis, SOB, N/V, fatigue   HPI: Timothy [name] is a pleasant 50 y.o. male with medical history significant for CKD stage IV, insulin-dependent diabetes mellitus, hypertension, and chronic diastolic CHF, now presenting to the emergency department for evaluation of shortness of breath, fatigue, nausea, vomiting, and cough with hemoptysis.  Patient reports that the symptoms all developed morning, 11/14/2020 and have persisted throughout the day.  He denies any abdominal pain or diarrhea and reports that the vomiting has been nonbloody and may have resolved now.  He has some discomfort in his back when he coughs but no chest pain and describes producing sputum mixed with scant red blood and occasional small clot.  Reports that his chronic bilateral leg swelling is a little better than usual.  He has not noticed any fevers or chills.  Denies history of DVT with PE.  ED Course: Upon arrival to the ED, patient is found to be afebrile, saturating mid 90s on room air, mildly tachypneic, and with stable blood pressure.  EKG features sinus rhythm.  Chest x-ray with question of developing infection.  CT reveals extensive bibasilar pulmonary infiltrate concerning for multifocal pneumonia or aspiration.  Chemistry panel features a creatinine of 3.56 and CBC with mild microcytic anemia.  D-dimer is 0.86.  COVID and influenza PCR are negative.  BNP was 243 and high-sensitivity troponin was 30 x2.  Patient was started on Rocephin and doxycycline in the ED.  Review of Systems:  All other systems reviewed and apart from HPI, are negative.  Past Medical History:  Diagnosis Date   CHF (congestive heart failure) ([place])    Diabetes mellitus without complication ([place])     Past Surgical History:  Procedure Laterality Date   EYE  SURGERY      Social History:   reports that he has never smoked. He has never used smokeless tobacco. He reports that he does not currently use alcohol. He reports that he does not currently use drugs.  No Known Allergies  History reviewed. No pertinent family history.   Prior to Admission medications   Medication Sig Start Date End Date Taking? Authorizing Provider  amLODipine (NORVASC) 5 MG tablet Take 5 mg by mouth daily. 10/21/20  Yes [provider]  furosemide (LASIX) 80 MG tablet Take 80 mg by mouth daily.   Yes [provider]  glucose blood (CONTOUR NEXT TEST) test strip Use as instructed 07/26/20  Yes Donato Heinz, MD  hydrALAZINE (APRESOLINE) 50 MG tablet Take 1 tablet (50 mg total) by mouth 3 (three) times daily. 07/05/20 06/30/21 Yes Donato Heinz, MD  hydrOXYzine (VISTARIL) 25 MG capsule Take 25 mg by mouth at bedtime. 11/01/20  Yes [provider]  insulin glargine (LANTUS SOLOSTAR) 100 UNIT/ML Solostar Pen Inject 10 Units into the skin daily. Inject as directed 08/22/20  Yes [name], Timothy Reveal, MD  Insulin Pen Needle (CAREFINE PEN NEEDLES) 32G X 4 MM MISC 1 applicator by Does not apply route See admin instructions. 08/22/20  Yes [name], [name], MD  isosorbide mononitrate (IMDUR) 30 MG 24 hr tablet Take 1 tablet (30 mg total) by mouth daily. 07/26/20 12/06/21 Yes Donato Heinz, MD  Lancets Micro Thin 33G MISC 1 each by Does not apply route in the morning, at noon, in the  evening, and at bedtime. 07/26/20  Yes Donato Heinz, MD  metoprolol succinate (TOPROL-XL) 25 MG 24 hr tablet Take 25 mg by mouth daily. 11/02/20  Yes [provider]  atorvastatin (LIPITOR) 40 MG tablet Take 1 tablet (40 mg total) by mouth daily. Patient not taking: No sig reported 07/26/20 11/15/21  Donato Heinz, MD  furosemide (LASIX) 40 MG tablet TAKE 1 TABLET(40 MG) BY MOUTH DAILY Patient not taking: Reported on 11/15/2020  11/13/20   [name], [name], MD    Physical Exam: Vitals:   11/14/20 2330 11/15/20 0030 11/15/20 0100 11/15/20 0130  BP: (!) 143/80 137/74 114/84 128/89  Pulse: 84 81 78 79  Resp: '20 18 18 16  '$ Temp:      TempSrc:      SpO2: 97% 96% 96% 98%  Weight:      Height:        Constitutional: NAD, calm  Eyes: PERTLA, lids and conjunctivae normal ENMT: Mucous membranes are moist. Posterior pharynx clear of any exudate or lesions.   Neck: supple, no masses  Respiratory: coarse rales bilaterally, no wheezing. No accessory muscle use.  Cardiovascular: S1 & S2 heard, regular rate and rhythm. Mild pretibial pitting edema bilaterally.  Abdomen: No distension, no tenderness, soft. Bowel sounds active.  Musculoskeletal: no clubbing / cyanosis. No joint deformity upper and lower extremities.   Skin: no significant rashes, lesions, ulcers. Warm, dry, well-perfused. Neurologic: CN 2-12 grossly intact. Moving all extremities. Alert and oriented.  Psychiatric: Pleasant. Cooperative.    Labs and Imaging on Admission: I have personally reviewed following labs and imaging studies  CBC: Recent Labs  Lab 11/14/20 2244  WBC 10.0  HGB 11.0*  HCT 32.5*  MCV 77.4*  PLT 99991111   Basic Metabolic Panel: Recent Labs  Lab 11/14/20 2244  NA 139  K 4.5  CL 110  CO2 23  GLUCOSE 136*  BUN 33*  CREATININE 3.56*  CALCIUM 8.7*   GFR: Estimated Creatinine Clearance: 25.8 mL/min (A) (by C-G formula based on SCr of 3.56 mg/dL (H)). Liver Function Tests: Recent Labs  Lab 11/14/20 2244  AST 24  ALT 20  ALKPHOS 54  BILITOT 0.9  PROT 7.7  ALBUMIN 3.2*   No results for input(s): LIPASE, AMYLASE in the last 168 hours. No results for input(s): AMMONIA in the last 168 hours. Coagulation Profile: Recent Labs  Lab 11/14/20 2244  INR 1.1   Cardiac Enzymes: No results for input(s): CKTOTAL, CKMB, CKMBINDEX, TROPONINI in the last 168 hours. BNP (last 3 results) No results for input(s): PROBNP in the  last 8760 hours. HbA1C: No results for input(s): HGBA1C in the last 72 hours. CBG: No results for input(s): GLUCAP in the last 168 hours. Lipid Profile: No results for input(s): CHOL, HDL, LDLCALC, TRIG, CHOLHDL, LDLDIRECT in the last 72 hours. Thyroid Function Tests: No results for input(s): TSH, T4TOTAL, FREET4, T3FREE, THYROIDAB in the last 72 hours. Anemia Panel: No results for input(s): VITAMINB12, FOLATE, FERRITIN, TIBC, IRON, RETICCTPCT in the last 72 hours. Urine analysis:    Component Value Date/Time   COLORURINE STRAW (A) 06/22/2020 1215   APPEARANCEUR CLEAR 06/22/2020 1215   LABSPEC 1.009 06/22/2020 1215   PHURINE 5.0 06/22/2020 1215   GLUCOSEU 50 (A) 06/22/2020 1215   HGBUR SMALL (A) 06/22/2020 1215   BILIRUBINUR NEGATIVE 06/22/2020 [place] 06/22/2020 1215   PROTEINUR 100 (A) 06/22/2020 1215   NITRITE NEGATIVE 06/22/2020 1215   LEUKOCYTESUR NEGATIVE 06/22/2020 1215  Sepsis Labs: '@LABRCNTIP'$ (procalcitonin:4,lacticidven:4) ) Recent Results (from the past 240 hour(s))  Resp Panel by RT-PCR (Flu A&B, Covid) Nasopharyngeal Swab     Status: None   Collection Time: 11/14/20 11:42 PM   Specimen: Nasopharyngeal Swab; Nasopharyngeal(NP) swabs in vial transport medium  Result Value Ref Range Status   SARS Coronavirus 2 by RT PCR NEGATIVE NEGATIVE Final    Comment: (NOTE) SARS-CoV-2 target nucleic acids are NOT DETECTED.  The SARS-CoV-2 RNA is generally detectable in upper respiratory specimens during the acute phase of infection. The lowest concentration of SARS-CoV-2 viral copies this assay can detect is 138 copies/mL. A negative result does not preclude SARS-Cov-2 infection and should not be used as the sole basis for treatment or other patient management decisions. A negative result may occur with  improper specimen collection/handling, submission of specimen other than nasopharyngeal swab, presence of viral mutation(s) within the areas targeted by  this assay, and inadequate number of viral copies(<138 copies/mL). A negative result must be combined with clinical observations, patient history, and epidemiological information. The expected result is Negative.  Fact Sheet for Patients:  EntrepreneurPulse.com.au  Fact Sheet for Healthcare Providers:  IncredibleEmployment.be  This test is no t yet approved or cleared by the [place] FDA and  has been authorized for detection and/or diagnosis of SARS-CoV-2 by FDA under an Emergency Use Authorization (EUA). This EUA will remain  in effect (meaning this test can be used) for the duration of the COVID-19 declaration under Section 564(b)(1) of the Act, 21 U.S.C.section 360bbb-3(b)(1), unless the authorization is terminated  or revoked sooner.       Influenza A by PCR NEGATIVE NEGATIVE Final   Influenza B by PCR NEGATIVE NEGATIVE Final    Comment: (NOTE) The Xpert Xpress SARS-CoV-2/FLU/RSV plus assay is intended as an aid in the diagnosis of influenza from Nasopharyngeal swab specimens and should not be used as a sole basis for treatment. Nasal washings and aspirates are unacceptable for Xpert Xpress SARS-CoV-2/FLU/RSV testing.  Fact Sheet for Patients: EntrepreneurPulse.com.au  Fact Sheet for Healthcare Providers: IncredibleEmployment.be  This test is not yet approved or cleared by the [place] FDA and has been authorized for detection and/or diagnosis of SARS-CoV-2 by FDA under an Emergency Use Authorization (EUA). This EUA will remain in effect (meaning this test can be used) for the duration of the COVID-19 declaration under Section 564(b)(1) of the Act, 21 U.S.C. section 360bbb-3(b)(1), unless the authorization is terminated or revoked.  Performed at [facility], [place] [address] [place], [place] [address]      Radiological Exams on Admission: DG Chest 2 View  Result  Date: 11/14/2020 CLINICAL DATA:  Back pain EXAM: CHEST - 2 VIEW COMPARISON:  06/22/2020 FINDINGS: Mild left basilar opacity. No pleural effusion or pneumothorax. Cardiomediastinal contours are normal. IMPRESSION: Mild left basilar opacity, possibly developing infection. Electronically Signed   By: Ulyses Jarred M.D.   On: 11/14/2020 22:15   CT Renal Stone Study  Result Date: 11/15/2020 CLINICAL DATA:  Bilateral flank pain, acute renal insufficiency EXAM: CT ABDOMEN AND PELVIS WITHOUT CONTRAST TECHNIQUE: Multidetector CT imaging of the abdomen and pelvis was performed following the standard protocol without IV contrast. COMPARISON:  None. FINDINGS: Lower chest: There is multifocal pulmonary infiltrate and consolidation within the visualized lung bases, most severe within the left lower lobe, in keeping with aspiration or multifocal infection. No pleural effusion. Visualized heart and pericardium are unremarkable. Hepatobiliary: No focal liver abnormality is seen. No gallstones, gallbladder wall thickening, or  biliary dilatation. Pancreas: Unremarkable Spleen: Unremarkable Adrenals/Urinary Tract: Adrenal glands are unremarkable. Kidneys are normal, without renal calculi, focal lesion, or hydronephrosis. Bladder is unremarkable. Stomach/Bowel: Stomach is within normal limits. Appendix appears normal. No evidence of bowel wall thickening, distention, or inflammatory changes. No free intraperitoneal gas or fluid. Vascular/Lymphatic: Aortic atherosclerosis. No enlarged abdominal or pelvic lymph nodes. Reproductive: Prostate is unremarkable. Other: Tiny fat containing umbilical and right inguinal hernias are present. Rectum unremarkable. Musculoskeletal: No acute or significant osseous findings. IMPRESSION: Extensive bibasilar pulmonary infiltrate consolidation, most severe within the left lower lobe, in keeping with multifocal infection or aspiration. No acute intra-abdominal pathology identified. Aortic  Atherosclerosis (ICD10-I70.0). Electronically Signed   By: Fidela Salisbury M.D.   On: 11/15/2020 00:06    EKG: Independently reviewed. Sinus rhythm, LVH.   Assessment/Plan   1. Multifocal pneumonia  - Presents with one day of SOB, cough with hemoptysis, N/V, and fatigue  - CT findings consistent with multifocal pneumonia  - Started on Rocephin and doxycycline in ED  - Check sputum culture, strep pneumo and legionella antigen, and trend procalcitonin while continuing current antibiotics   2. Hemoptysis  - Presents with 1 day scant hemoptysis mixed with sputum  - CT findings consistent with multifocal PNA; mild elevation in d-dimer noted without chest pain  - Likely from pneumonia  - Continue antibiotics, check V/Q scan   3. CKD IV  - SCr is 3.56, up from 3.14 last month and 2.55 in June 2022  - Hold Lasix initially in light of recent N/V, renally-dose medications, monitor   4. Chronic diastolic CHF  - BNP similar to priors, mild leg swelling better than usual per pt  - EF was preserved on TTE from May 2022  - Hold Lasix initially given recent N/V and increased SCr    5. Elevated troponin  - HS troponin slightly elevated and flat without chest pain  - Low suspicion for ACS, likely related to acute respiratory illness and advanced CKD    6. Hypertension  - BP at goal  - Continue Norvasc, hydralazine, Toprol   7. Insulin-dependent DM  - A1c was 8.1% in September 2022 - Continue insulin    DVT prophylaxis: SCDs  Code Status: Full  Level of Care: Level of care: Telemetry Family Communication: none present   Disposition Plan:  Patient is from: [place]  Anticipated d/c is to: home  Anticipated d/c date is: 11/17/20 Patient currently: Pending V/Q scan, LE venous dopplers, improvement in respiratory status  Consults called: None  Admission status: Inpatient     Vianne Bulls, MD [facility]  11/15/2020, 2:35 AM

## 2020-11-15 NOTE — Progress Notes (Signed)
Patient ID: Timothy [name], male   DOB: 01/03/1971, 50 y.o.   MRN: [id number] Patient admitted early this morning for cough with hemoptysis and was found to have multifocal pneumonia.  He was started on broad-spectrum antibiotics.  I have reviewed patient medical records including this morning's H&P, current vitals, medications and labs myself.  Patient seen and examined at bedside and plan of care discussed with him.  Continue current antibiotics.  Repeat a.m. labs.  Supplemental oxygen if needed

## 2020-11-15 NOTE — Plan of Care (Signed)

## 2020-11-15 NOTE — Progress Notes (Signed)
Bilateral lower extremity venous duplex has been completed. Preliminary results can be found in CV Proc through chart review.   11/15/20 10:01 AM Timothy [name] RVT

## 2020-11-15 NOTE — ED Notes (Signed)
Meal tray provided.

## 2020-11-16 ENCOUNTER — Encounter (INDEPENDENT_AMBULATORY_CARE_PROVIDER_SITE_OTHER): Payer: 59 | Admitting: Ophthalmology

## 2020-11-16 DIAGNOSIS — J189 Pneumonia, unspecified organism: Secondary | ICD-10-CM | POA: Diagnosis not present

## 2020-11-16 DIAGNOSIS — I5032 Chronic diastolic (congestive) heart failure: Secondary | ICD-10-CM | POA: Diagnosis not present

## 2020-11-16 DIAGNOSIS — N179 Acute kidney failure, unspecified: Secondary | ICD-10-CM

## 2020-11-16 DIAGNOSIS — N184 Chronic kidney disease, stage 4 (severe): Secondary | ICD-10-CM | POA: Diagnosis not present

## 2020-11-16 LAB — PROCALCITONIN: Procalcitonin: 2.62 ng/mL

## 2020-11-16 LAB — GLUCOSE, CAPILLARY
Glucose-Capillary: 154 mg/dL — ABNORMAL HIGH (ref 70–99)
Glucose-Capillary: 164 mg/dL — ABNORMAL HIGH (ref 70–99)
Glucose-Capillary: 111 mg/dL — ABNORMAL HIGH (ref 70–99)
Glucose-Capillary: 165 mg/dL — ABNORMAL HIGH (ref 70–99)

## 2020-11-16 LAB — MAGNESIUM: Magnesium: 1.7 mg/dL (ref 1.7–2.4)

## 2020-11-16 LAB — BASIC METABOLIC PANEL
Anion gap: 7 (ref 5–15)
Anion gap: 7 (ref 5–15)
BUN: 33 mg/dL — ABNORMAL HIGH (ref 6–20)
BUN: 34 mg/dL — ABNORMAL HIGH (ref 6–20)
CO2: 22 mmol/L (ref 22–32)
CO2: 22 mmol/L (ref 22–32)
Calcium: 8.1 mg/dL — ABNORMAL LOW (ref 8.9–10.3)
Calcium: 8.3 mg/dL — ABNORMAL LOW (ref 8.9–10.3)
Chloride: 110 mmol/L (ref 98–111)
Chloride: 111 mmol/L (ref 98–111)
Creatinine, Ser: 4.21 mg/dL — ABNORMAL HIGH (ref 0.61–1.24)
Creatinine, Ser: 4.22 mg/dL — ABNORMAL HIGH (ref 0.61–1.24)
GFR, Estimated: 16 mL/min — ABNORMAL LOW (ref >60)
GFR, Estimated: 16 mL/min — ABNORMAL LOW (ref >60)
Glucose, Bld: 157 mg/dL — ABNORMAL HIGH (ref 70–99)
Glucose, Bld: 159 mg/dL — ABNORMAL HIGH (ref 70–99)
Potassium: 4.7 mmol/L (ref 3.5–5.1)
Potassium: 4.8 mmol/L (ref 3.5–5.1)
Sodium: 139 mmol/L (ref 135–145)
Sodium: 140 mmol/L (ref 135–145)

## 2020-11-16 LAB — CBC
HCT: 29.2 % — ABNORMAL LOW (ref 39.0–52.0)
Hemoglobin: 9.4 g/dL — ABNORMAL LOW (ref 13.0–17.0)
MCH: 25.5 pg — ABNORMAL LOW (ref 26.0–34.0)
MCHC: 32.2 g/dL (ref 30.0–36.0)
MCV: 79.3 fL — ABNORMAL LOW (ref 80.0–100.0)
Platelets: 268 10*3/uL (ref 150–400)
RBC: 3.68 MIL/uL — ABNORMAL LOW (ref 4.22–5.81)
RDW: 14.7 % (ref 11.5–15.5)
WBC: 6.2 10*3/uL (ref 4.0–10.5)
nRBC: 0 % (ref 0.0–0.2)

## 2020-11-16 LAB — LEGIONELLA PNEUMOPHILA SEROGP 1 UR AG: L. pneumophila Serogp 1 Ur Ag: NEGATIVE

## 2020-11-16 MED ORDER — SODIUM CHLORIDE 0.9 % IV SOLN: INTRAVENOUS | Status: DC

## 2020-11-16 NOTE — Plan of Care (Signed)
  Problem: Health Behavior/Discharge Planning: Goal: Ability to manage health-related needs will improve Outcome: Progressing   

## 2020-11-16 NOTE — Plan of Care (Signed)
  Problem: Health Behavior/Discharge Planning: Goal: Ability to manage health-related needs will improve Outcome: Progressing   Problem: Activity: Goal: Risk for activity intolerance will decrease Outcome: Progressing   Problem: Education: Goal: Knowledge of General Education information will improve Description: Including pain rating scale, medication(s)/side effects and non-pharmacologic comfort measures Outcome: Completed/Met

## 2020-11-16 NOTE — Progress Notes (Signed)
Patient ID: Timothy [name] II, male   DOB: 1970-06-24, 50 y.o.   MRN: [id number]  PROGRESS NOTE    Nicko Gilberts II  [id number] DOB: May 07, 1970 DOA: 11/14/2020 PCP: [name], [name], MD   Brief Narrative:  50 year old male with history of CKD stage IV, diabetes mellitus type 2, hypertension and chronic diastolic CHF presented with worsening shortness of breath, cough and hemoptysis.  On presentation, he was mildly tachypneic but afebrile.  Chest x-ray showed question of developing infection.  CT of the chest showed bibasilar pulmonary infiltrates concerning for multifocal pneumonia or aspiration.  COVID and influenza tests were negative.  Creatinine of 3.56; D-dimer of 0.86, BNP of 243 and high sensitive troponin was 30x2.  He was started on Rocephin and doxycycline.  Assessment & Plan:   Multifocal pneumonia Hemoptysis, most likely from pneumonia -Presented with worsening shortness of breath, cough and hemoptysis.  CT findings consistent with multifocal pneumonia -COVID-19 and influenza testing negative on presentation -Strep pneumo urinary antigen negative. -Continue Rocephin and doxycycline.  Currently on room air. -Hemoptysis is improving.  VQ scan was low probability for PE -Procalcitonin improving to 2.6 today  CKD stage IV -Presented with creatinine of 3.56, up from 3.14 last month and 2.55 in June 2022.  Creatinine improving to 2.91 on 11/15/2020.  Creatinine 4.21 today; repeat creatinine 4.22.  Start gentle hydration with normal saline at 75 cc an hour.  If creatinine does not improve in a.m., will need nephrology evaluation.  CT renal stone study was negative for hydronephrosis.  Anemia of chronic disease -Hemoglobin stable.  Monitor.  No signs of bleeding  Chronic diastolic CHF -Strict input and output.  Daily weights.  Currently compensated. -Lasix on hold.  Continue Imdur, hydralazine and metoprolol succinate.  Outpatient follow-up with  cardiology  Hypertension -Blood pressure stable.  Continue amlodipine, hydralazine, Toprol and Imdur  Diabetes mellitus type 2 -A1c 8.1 in September 2022. Continue NovoLog and CBGs with SSI   DVT prophylaxis: SCDs Code Status: Full Family Communication: None at bedside Disposition Plan: Status is: Inpatient  Remains inpatient appropriate because:Inpatient level of care appropriate due to severity of illness  Dispo: The patient is from: Home              Anticipated d/c is to: Home              Patient currently is not medically stable to d/c.   Difficult to place patient No   Consultants: None  Procedures: None  Antimicrobials: Rocephin and doxycycline from 11/14/2020 onwards   Subjective: Patient seen and examined at bedside.  Denies worsening chest pain, nausea or vomiting.  Denies worsening shortness breath.  Still complains of some cough and fever.  Objective: Vitals:   11/15/20 2148 11/16/20 0206 11/16/20 0500 11/16/20 0531  BP: 137/77 (!) 128/59  (!) 141/81  Pulse: 81 80  81  Resp: '18 16  16  '$ Temp: (!) 100.6 F (38.1 C) 99.9 F (37.7 C)  99.7 F (37.6 C)  TempSrc: Oral Oral  Oral  SpO2: 97% 95%  97%  Weight:   86.2 kg   Height:        Intake/Output Summary (Last 24 hours) at 11/16/2020 0801 Last data filed at 11/16/2020 0339 Gross per 24 hour  Intake 601.26 ml  Output --  Net 601.26 ml   Filed Weights   11/14/20 2111 11/16/20 0500  Weight: 86.2 kg 86.2 kg    Examination:  General exam: Appears calm and comfortable.  Currently on room air.  Looks chronically ill. Respiratory system: Bilateral decreased breath sounds at bases with some scattered crackles Cardiovascular system: S1 & S2 heard, Rate controlled Gastrointestinal system: Abdomen is nondistended, soft and nontender. Normal bowel sounds heard. Extremities: No cyanosis, clubbing, edema  Central nervous system: Alert and oriented. No focal neurological deficits. Moving extremities Skin:  No rashes, lesions or ulcers Psychiatry: Affect is mostly flat.     Data Reviewed: I have personally reviewed following labs and imaging studies  CBC: Recent Labs  Lab 11/14/20 2244 11/15/20 0300 11/16/20 0436  WBC 10.0 9.2 6.2  HGB 11.0* 9.7* 9.4*  HCT 32.5* 29.2* 29.2*  MCV 77.4* 78.1* 79.3*  PLT 320 282 [id number]   Basic Metabolic Panel: Recent Labs  Lab 11/14/20 2244 11/15/20 0300 11/16/20 0436  NA 139 136 139  K 4.5 4.6 4.7  CL 110 106 110  CO2 '23 23 22  '$ GLUCOSE 136* 132* 157*  BUN 33* 31* 33*  CREATININE 3.56* 2.91* 4.21*  CALCIUM 8.7* 7.9* 8.1*  MG  --   --  1.7   GFR: Estimated Creatinine Clearance: 21.9 mL/min (A) (by C-G formula based on SCr of 4.21 mg/dL (H)). Liver Function Tests: Recent Labs  Lab 11/14/20 2244  AST 24  ALT 20  ALKPHOS 54  BILITOT 0.9  PROT 7.7  ALBUMIN 3.2*   No results for input(s): LIPASE, AMYLASE in the last 168 hours. No results for input(s): AMMONIA in the last 168 hours. Coagulation Profile: Recent Labs  Lab 11/14/20 2244  INR 1.1   Cardiac Enzymes: No results for input(s): CKTOTAL, CKMB, CKMBINDEX, TROPONINI in the last 168 hours. BNP (last 3 results) No results for input(s): PROBNP in the last 8760 hours. HbA1C: No results for input(s): HGBA1C in the last 72 hours. CBG: Recent Labs  Lab 11/15/20 0913 11/15/20 1138 11/15/20 1659 11/15/20 2050 11/16/20 0730  GLUCAP 113* 77 152* 159* 154*   Lipid Profile: No results for input(s): CHOL, HDL, LDLCALC, TRIG, CHOLHDL, LDLDIRECT in the last 72 hours. Thyroid Function Tests: No results for input(s): TSH, T4TOTAL, FREET4, T3FREE, THYROIDAB in the last 72 hours. Anemia Panel: No results for input(s): VITAMINB12, FOLATE, FERRITIN, TIBC, IRON, RETICCTPCT in the last 72 hours. Sepsis Labs: Recent Labs  Lab 11/15/20 0021 11/15/20 0300 11/16/20 0436  PROCALCITON 5.62 5.11 2.62    Recent Results (from the past 240 hour(s))  Resp Panel by RT-PCR (Flu A&B, Covid)  Nasopharyngeal Swab     Status: None   Collection Time: 11/14/20 11:42 PM   Specimen: Nasopharyngeal Swab; Nasopharyngeal(NP) swabs in vial transport medium  Result Value Ref Range Status   SARS Coronavirus 2 by RT PCR NEGATIVE NEGATIVE Final    Comment: (NOTE) SARS-CoV-2 target nucleic acids are NOT DETECTED.  The SARS-CoV-2 RNA is generally detectable in upper respiratory specimens during the acute phase of infection. The lowest concentration of SARS-CoV-2 viral copies this assay can detect is 138 copies/mL. A negative result does not preclude SARS-Cov-2 infection and should not be used as the sole basis for treatment or other patient management decisions. A negative result may occur with  improper specimen collection/handling, submission of specimen other than nasopharyngeal swab, presence of viral mutation(s) within the areas targeted by this assay, and inadequate number of viral copies(<138 copies/mL). A negative result must be combined with clinical observations, patient history, and epidemiological information. The expected result is Negative.  Fact Sheet for Patients:  EntrepreneurPulse.com.au  Fact Sheet for Healthcare Providers:  IncredibleEmployment.be  This  test is no t yet approved or cleared by the [place] and  has been authorized for detection and/or diagnosis of SARS-CoV-2 by FDA under an Emergency Use Authorization (EUA). This EUA will remain  in effect (meaning this test can be used) for the duration of the COVID-19 declaration under Section 564(b)(1) of the Act, 21 U.S.C.section 360bbb-3(b)(1), unless the authorization is terminated  or revoked sooner.       Influenza A by PCR NEGATIVE NEGATIVE Final   Influenza B by PCR NEGATIVE NEGATIVE Final    Comment: (NOTE) The Xpert Xpress SARS-CoV-2/FLU/RSV plus assay is intended as an aid in the diagnosis of influenza from Nasopharyngeal swab specimens and should not be  used as a sole basis for treatment. Nasal washings and aspirates are unacceptable for Xpert Xpress SARS-CoV-2/FLU/RSV testing.  Fact Sheet for Patients: EntrepreneurPulse.com.au  Fact Sheet for Healthcare Providers: IncredibleEmployment.be  This test is not yet approved or cleared by the [place] FDA and has been authorized for detection and/or diagnosis of SARS-CoV-2 by FDA under an Emergency Use Authorization (EUA). This EUA will remain in effect (meaning this test can be used) for the duration of the COVID-19 declaration under Section 564(b)(1) of the Act, 21 U.S.C. section 360bbb-3(b)(1), unless the authorization is terminated or revoked.  Performed at [facility], [place] [address] [place], [place] [address]          Radiology Studies: DG Chest 2 View  Result Date: 11/14/2020 CLINICAL DATA:  Back pain EXAM: CHEST - 2 VIEW COMPARISON:  06/22/2020 FINDINGS: Mild left basilar opacity. No pleural effusion or pneumothorax. Cardiomediastinal contours are normal. IMPRESSION: Mild left basilar opacity, possibly developing infection. Electronically Signed   By: Ulyses Jarred M.D.   On: 11/14/2020 22:15   NM Pulmonary Perfusion  Result Date: 11/15/2020 CLINICAL DATA:  Chest pain, shortness of breath, elevated D-dimer. PE suspected, low/intermediate prob, positive D-dimer EXAM: NUCLEAR MEDICINE PERFUSION LUNG SCAN TECHNIQUE: Perfusion images were obtained in multiple projections after intravenous injection of radiopharmaceutical. Ventilation scans intentionally deferred if perfusion scan and chest x-ray adequate for interpretation during COVID 19 epidemic. RADIOPHARMACEUTICALS:  4.3 mCi Tc-56mMAA IV COMPARISON:  Chest x-ray 11/14/2020 FINDINGS: Physiologic distribution of radiotracer. Slightly heterogeneous perfusion within the left lung base corresponding to left basilar opacity seen radiographically. No segmental perfusion  defects. IMPRESSION: Low probability for pulmonary embolism. Electronically Signed   By: NDavina PokeD.O.   On: 11/15/2020 10:56   CT Renal Stone Study  Result Date: 11/15/2020 CLINICAL DATA:  Bilateral flank pain, acute renal insufficiency EXAM: CT ABDOMEN AND PELVIS WITHOUT CONTRAST TECHNIQUE: Multidetector CT imaging of the abdomen and pelvis was performed following the standard protocol without IV contrast. COMPARISON:  None. FINDINGS: Lower chest: There is multifocal pulmonary infiltrate and consolidation within the visualized lung bases, most severe within the left lower lobe, in keeping with aspiration or multifocal infection. No pleural effusion. Visualized heart and pericardium are unremarkable. Hepatobiliary: No focal liver abnormality is seen. No gallstones, gallbladder wall thickening, or biliary dilatation. Pancreas: Unremarkable Spleen: Unremarkable Adrenals/Urinary Tract: Adrenal glands are unremarkable. Kidneys are normal, without renal calculi, focal lesion, or hydronephrosis. Bladder is unremarkable. Stomach/Bowel: Stomach is within normal limits. Appendix appears normal. No evidence of bowel wall thickening, distention, or inflammatory changes. No free intraperitoneal gas or fluid. Vascular/Lymphatic: Aortic atherosclerosis. No enlarged abdominal or pelvic lymph nodes. Reproductive: Prostate is unremarkable. Other: Tiny fat containing umbilical and right inguinal hernias are present. Rectum unremarkable. Musculoskeletal: No acute or  significant osseous findings. IMPRESSION: Extensive bibasilar pulmonary infiltrate consolidation, most severe within the left lower lobe, in keeping with multifocal infection or aspiration. No acute intra-abdominal pathology identified. Aortic Atherosclerosis (ICD10-I70.0). Electronically Signed   By: Fidela Salisbury M.D.   On: 11/15/2020 00:06   VAS [place] LOWER EXTREMITY VENOUS (DVT)  Result Date: 11/15/2020  Lower Venous DVT Study Patient Name:  Timothy [name] II  Date of Exam:   11/15/2020 Medical Rec #: [id number]              Accession #:    [id number] Date of Birth: 04-16-1970              Patient Gender: M Patient Age:   53 years Exam Location:  [facility] Procedure:      VAS [place] LOWER EXTREMITY VENOUS (DVT) Referring Phys: TIMOTHY OPYD --------------------------------------------------------------------------------  Indications: Elevated Ddimer.  Risk Factors: None identified. Comparison Study: No prior studies. Performing Technologist: Oliver Hum RVT  Examination Guidelines: A complete evaluation includes B-mode imaging, spectral Doppler, color Doppler, and power Doppler as needed of all accessible portions of each vessel. Bilateral testing is considered an integral part of a complete examination. Limited examinations for reoccurring indications may be performed as noted. The reflux portion of the exam is performed with the patient in reverse Trendelenburg.  +---------+---------------+---------+-----------+----------+--------------+ RIGHT    CompressibilityPhasicitySpontaneityPropertiesThrombus Aging +---------+---------------+---------+-----------+----------+--------------+ CFV      Full           Yes      Yes                                 +---------+---------------+---------+-----------+----------+--------------+ SFJ      Full                                                        +---------+---------------+---------+-----------+----------+--------------+ FV Prox  Full                                                        +---------+---------------+---------+-----------+----------+--------------+ FV Mid   Full                                                        +---------+---------------+---------+-----------+----------+--------------+ FV DistalFull                                                        +---------+---------------+---------+-----------+----------+--------------+ PFV      Full                                                         +---------+---------------+---------+-----------+----------+--------------+  POP      Full           Yes      Yes                                 +---------+---------------+---------+-----------+----------+--------------+ PTV      Full                                                        +---------+---------------+---------+-----------+----------+--------------+ PERO     Full                                                        +---------+---------------+---------+-----------+----------+--------------+   +---------+---------------+---------+-----------+----------+--------------+ LEFT     CompressibilityPhasicitySpontaneityPropertiesThrombus Aging +---------+---------------+---------+-----------+----------+--------------+ CFV      Full           Yes      Yes                                 +---------+---------------+---------+-----------+----------+--------------+ SFJ      Full                                                        +---------+---------------+---------+-----------+----------+--------------+ FV Prox  Full                                                        +---------+---------------+---------+-----------+----------+--------------+ FV Mid   Full                                                        +---------+---------------+---------+-----------+----------+--------------+ FV DistalFull                                                        +---------+---------------+---------+-----------+----------+--------------+ PFV      Full                                                        +---------+---------------+---------+-----------+----------+--------------+ POP      Full           Yes      Yes                                 +---------+---------------+---------+-----------+----------+--------------+  PTV      Full                                                         +---------+---------------+---------+-----------+----------+--------------+ PERO     Full                                                        +---------+---------------+---------+-----------+----------+--------------+     Summary: RIGHT: - There is no evidence of deep vein thrombosis in the lower extremity.  - No cystic structure found in the popliteal fossa.  LEFT: - There is no evidence of deep vein thrombosis in the lower extremity.  - No cystic structure found in the popliteal fossa.  *See table(s) above for measurements and observations. Electronically signed by Harold Barban MD on 11/15/2020 at 9:22:57 PM.    Final         Scheduled Meds:  amLODipine  5 mg Oral Daily   hydrALAZINE  50 mg Oral TID   hydrOXYzine  25 mg Oral QHS   insulin aspart  0-6 Units Subcutaneous TID WC   insulin aspart  2 Units Subcutaneous TID WC   isosorbide mononitrate  30 mg Oral Daily   metoprolol succinate  25 mg Oral Daily   Continuous Infusions:  cefTRIAXone (ROCEPHIN)  IV Stopped (11/16/20 0106)   doxycycline (VIBRAMYCIN) IV Stopped (11/16/20 0247)          Aline August, MD [facility] 11/16/2020, 8:01 AM

## 2020-11-17 DIAGNOSIS — N179 Acute kidney failure, unspecified: Secondary | ICD-10-CM | POA: Diagnosis not present

## 2020-11-17 DIAGNOSIS — I5032 Chronic diastolic (congestive) heart failure: Secondary | ICD-10-CM | POA: Diagnosis not present

## 2020-11-17 DIAGNOSIS — N184 Chronic kidney disease, stage 4 (severe): Secondary | ICD-10-CM | POA: Diagnosis not present

## 2020-11-17 DIAGNOSIS — N189 Chronic kidney disease, unspecified: Secondary | ICD-10-CM

## 2020-11-17 DIAGNOSIS — J189 Pneumonia, unspecified organism: Secondary | ICD-10-CM | POA: Diagnosis not present

## 2020-11-17 LAB — BASIC METABOLIC PANEL
Anion gap: 8 (ref 5–15)
BUN: 29 mg/dL — ABNORMAL HIGH (ref 6–20)
CO2: 19 mmol/L — ABNORMAL LOW (ref 22–32)
Calcium: 7.6 mg/dL — ABNORMAL LOW (ref 8.9–10.3)
Chloride: 108 mmol/L (ref 98–111)
Creatinine, Ser: 3.97 mg/dL — ABNORMAL HIGH (ref 0.61–1.24)
GFR, Estimated: 18 mL/min — ABNORMAL LOW (ref >60)
Glucose, Bld: 139 mg/dL — ABNORMAL HIGH (ref 70–99)
Potassium: 4.4 mmol/L (ref 3.5–5.1)
Sodium: 135 mmol/L (ref 135–145)

## 2020-11-17 LAB — CBC
HCT: 29.1 % — ABNORMAL LOW (ref 39.0–52.0)
Hemoglobin: 9.2 g/dL — ABNORMAL LOW (ref 13.0–17.0)
MCH: 25.8 pg — ABNORMAL LOW (ref 26.0–34.0)
MCHC: 31.6 g/dL (ref 30.0–36.0)
MCV: 81.5 fL (ref 80.0–100.0)
Platelets: 262 10*3/uL (ref 150–400)
RBC: 3.57 MIL/uL — ABNORMAL LOW (ref 4.22–5.81)
RDW: 14.9 % (ref 11.5–15.5)
WBC: 8 10*3/uL (ref 4.0–10.5)
nRBC: 0 % (ref 0.0–0.2)

## 2020-11-17 LAB — MAGNESIUM: Magnesium: 1.6 mg/dL — ABNORMAL LOW (ref 1.7–2.4)

## 2020-11-17 LAB — GLUCOSE, CAPILLARY
Glucose-Capillary: 136 mg/dL — ABNORMAL HIGH (ref 70–99)
Glucose-Capillary: 161 mg/dL — ABNORMAL HIGH (ref 70–99)
Glucose-Capillary: 112 mg/dL — ABNORMAL HIGH (ref 70–99)
Glucose-Capillary: 165 mg/dL — ABNORMAL HIGH (ref 70–99)

## 2020-11-17 MED ORDER — DOXYCYCLINE HYCLATE 100 MG PO TABS
100.0000 mg | ORAL_TABLET | Freq: Two times a day (BID) | ORAL | Status: DC
  Administered 2020-11-17: 100 mg via ORAL
  Filled 2020-11-17: qty 1

## 2020-11-17 MED ORDER — METOCLOPRAMIDE HCL 5 MG PO TABS
5.0000 mg | ORAL_TABLET | Freq: Three times a day (TID) | ORAL | Status: DC
  Administered 2020-11-17 – 2020-11-18 (×4): 5 mg via ORAL
  Filled 2020-11-17 (×4): qty 1

## 2020-11-17 NOTE — Plan of Care (Signed)
  Problem: Health Behavior/Discharge Planning: Goal: Ability to manage health-related needs will improve Outcome: Progressing   Problem: Clinical Measurements: Goal: Will remain free from infection Outcome: Progressing Goal: Cardiovascular complication will be avoided Outcome: Progressing   

## 2020-11-17 NOTE — Care Management Important Message (Signed)
Important Message  Patient Details IM Letter given to the Patient. Name: Dorrian Thornes MRN: [id number] Date of Birth: August 11, 1970   Medicare Important Message Given:  Yes     Kerin Salen 11/17/2020, 1:23 PM

## 2020-11-17 NOTE — Progress Notes (Signed)
Timothy [name], male   DOB: [birth year], 50 y.o.   MRN: [id number]  PROGRESS NOTE    Timothy [name]  [id number] DOB: [birth year] DOA: 11/14/2020 PCP: [name], [name], MD   Brief Narrative:  50 year old male with history of CKD stage IV, diabetes mellitus type 2, hypertension and chronic diastolic CHF presented with worsening shortness of breath, cough and hemoptysis.  On presentation, he was mildly tachypneic but afebrile.  Chest x-ray showed question of developing infection.  CT of the chest showed bibasilar pulmonary infiltrates concerning for multifocal pneumonia or aspiration.  COVID and influenza tests were negative.  Creatinine of 3.56; D-dimer of 0.86, BNP of 243 and high sensitive troponin was 30x2.  He was started on Rocephin and doxycycline.  Assessment & Plan:   Multifocal pneumonia/Hemoptysis, most likely from pneumonia -Presented with worsening shortness of breath, cough and hemoptysis.  CT findings consistent with multifocal pneumonia -COVID-19 and influenza testing negative on presentation -Strep pneumo urinary antigen negative. -Remains on ceftriaxone and doxycycline.  Saturating normal on room air.  Continues to have nausea and had vomiting yesterday.  We will leave him on IV antibiotics for today.   Hemoptysis has improved.  VQ scan was low probability for PE.  Lower extremity Doppler studies were negative for DVT. Procalcitonin had improved to 2.6.  CKD stage IV -Presented with creatinine of 3.56, up from 3.14 last month and 2.55 in June 2022.   Creatinine had improved to 2.91 but then noted to be 4.21 on 10/13.  Started on IV hydration.  Noted to be 3.97 today.  Monitor urine output.  Continue IV fluids for another 24 hours.  CT scan of the abdomen done recently did not show any hydronephrosis.    Nausea and vomiting Possibly multifactorial including pneumonia as well as renal failure.  Abdomen remains benign.  CT scan did not show any  acute findings.  Continue to monitor for now.  Gastroparesis is another possibility.  Anemia of chronic disease -Hemoglobin stable.  Monitor.  No signs of bleeding  Chronic diastolic CHF -Strict input and output.  Daily weights.  Currently compensated. Furosemide is on hold due to rising creatinine.  Other medications including metoprolol hydralazine and Imdur being continued.  Follow-up with cardiology in the outpatient setting.  Essential hypertension -Blood pressure stable.  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 Continue NovoLog and CBGs with SSI.  CBGs are reasonably well controlled.   DVT prophylaxis: SCDs Code Status: Full code Family Communication: Discussed with patient.  No family at bedside Disposition Plan: Hopefully return home when improved.  Status is: Inpatient  Remains inpatient appropriate because:Inpatient level of care appropriate due to severity of illness  Dispo: The patient is from: Home              Anticipated d/c is to: Home              Patient currently is not medically stable to d/c.   Difficult to place patient No   Consultants: None  Procedures: None  Antimicrobials: Rocephin and doxycycline from 11/14/2020 onwards   Subjective: Patient continues to have nausea and had episodes of vomiting last night.  Denies any abdominal pain.  Shortness of breath is improved.  Cough is improved.    Objective: Vitals:   11/16/20 1709 11/16/20 2040 11/17/20 0511 11/17/20 0819  BP: (!) 149/85 119/69  140/73 (!) 155/92  Pulse:  81 82   Resp:  16 20   Temp:  99.7 F (37.6 C) 98.7 F (37.1 C)   TempSrc:  Oral    SpO2:  98% 96%   Weight:      Height:        Intake/Output Summary (Last 24 hours) at 11/17/2020 1038 Last data filed at 11/17/2020 0600 Gross per 24 hour  Intake 1938.98 ml  Output 0 ml  Net 1938.98 ml     Filed Weights   11/14/20 2111 11/16/20 0500  Weight: 86.2 kg 86.2 kg    Examination:  General appearance: Awake alert.  In no distress Resp: Good air entry bilaterally.  Normal effort at rest.  Few crackles at the bases.  No wheezing or rhonchi. Cardio: S1-S2 is normal regular.  No S3-S4.  No rubs murmurs or bruit GI: Abdomen is soft.  Nontender nondistended.  Bowel sounds are present normal.  No masses organomegaly Extremities: No edema.  Full range of motion of lower extremities. Neurologic: Alert and oriented x3.  No focal neurological deficits.     Data Reviewed: I have personally reviewed following labs and imaging studies  CBC: Recent Labs  Lab 11/14/20 2244 11/15/20 0300 11/16/20 0436 11/17/20 0433  WBC 10.0 9.2 6.2 8.0  HGB 11.0* 9.7* 9.4* 9.2*  HCT 32.5* 29.2* 29.2* 29.1*  MCV 77.4* 78.1* 79.3* 81.5  PLT 320 282 268 [id number]    Basic Metabolic Panel: Recent Labs  Lab 11/14/20 2244 11/15/20 0300 11/16/20 0436 11/16/20 0808 11/17/20 0433  NA 139 136 139 140 135  K 4.5 4.6 4.7 4.8 4.4  CL 110 106 110 111 108  CO2 '23 23 22 22 '$ 19*  GLUCOSE 136* 132* 157* 159* 139*  BUN 33* 31* 33* 34* 29*  CREATININE 3.56* 2.91* 4.21* 4.22* 3.97*  CALCIUM 8.7* 7.9* 8.1* 8.3* 7.6*  MG  --   --  1.7  --  1.6*    GFR: Estimated Creatinine Clearance: 23.2 mL/min (A) (by C-G formula based on SCr of 3.97 mg/dL (H)). Liver Function Tests: Recent Labs  Lab 11/14/20 2244  AST 24  ALT 20  ALKPHOS 54  BILITOT 0.9  PROT 7.7  ALBUMIN 3.2*     Coagulation Profile: Recent Labs  Lab 11/14/20 2244  INR 1.1     CBG: Recent Labs  Lab 11/16/20 0730 11/16/20 1204 11/16/20 1700 11/16/20 2046 11/17/20 0740  GLUCAP 154* 164* 111* 165* 136*     Sepsis Labs: Recent Labs  Lab 11/15/20 0021 11/15/20 0300 11/16/20 0436  PROCALCITON 5.62 5.11 2.62     Recent Results (from the past 240 hour(s))  Resp Panel by RT-PCR (Flu A&B, Covid) Nasopharyngeal Swab      Status: None   Collection Time: 11/14/20 11:42 PM   Specimen: Nasopharyngeal Swab; Nasopharyngeal(NP) swabs in vial transport medium  Result Value Ref Range Status   SARS Coronavirus 2 by RT PCR NEGATIVE NEGATIVE Final    Comment: (NOTE) SARS-CoV-2 target nucleic acids are NOT DETECTED.  The SARS-CoV-2 RNA is generally detectable in upper respiratory specimens during the acute phase of infection. The lowest concentration of SARS-CoV-2 viral copies this assay can detect is 138 copies/mL. A negative result does not preclude SARS-Cov-2 infection and should not be used as the sole basis for treatment or other patient management decisions. A negative result may occur with  improper specimen collection/handling, submission of specimen other than nasopharyngeal swab, presence of viral mutation(s) within the areas targeted  by this assay, and inadequate number of viral copies(<138 copies/mL). A negative result must be combined with clinical observations, patient history, and epidemiological information. The expected result is Negative.  Fact Sheet for Patients:  EntrepreneurPulse.com.au  Fact Sheet for Healthcare Providers:  IncredibleEmployment.be  This test is no t yet approved or cleared by the [place] FDA and  has been authorized for detection and/or diagnosis of SARS-CoV-2 by FDA under an Emergency Use Authorization (EUA). This EUA will remain  in effect (meaning this test can be used) for the duration of the COVID-19 declaration under Section 564(b)(1) of the Act, 21 U.S.C.section 360bbb-3(b)(1), unless the authorization is terminated  or revoked sooner.       Influenza A by PCR NEGATIVE NEGATIVE Final   Influenza B by PCR NEGATIVE NEGATIVE Final    Comment: (NOTE) The Xpert Xpress SARS-CoV-2/FLU/RSV plus assay is intended as an aid in the diagnosis of influenza from Nasopharyngeal swab specimens and should not be used as a sole basis  for treatment. Nasal washings and aspirates are unacceptable for Xpert Xpress SARS-CoV-2/FLU/RSV testing.  Fact Sheet for Patients: EntrepreneurPulse.com.au  Fact Sheet for Healthcare Providers: IncredibleEmployment.be  This test is not yet approved or cleared by the [place] FDA and has been authorized for detection and/or diagnosis of SARS-CoV-2 by FDA under an Emergency Use Authorization (EUA). This EUA will remain in effect (meaning this test can be used) for the duration of the COVID-19 declaration under Section 564(b)(1) of the Act, 21 U.S.C. section 360bbb-3(b)(1), unless the authorization is terminated or revoked.  Performed at [facility], [place] [address], [place], [place] [address]           Radiology Studies: No results found.    Scheduled Meds:  amLODipine  5 mg Oral Daily   hydrALAZINE  50 mg Oral TID   hydrOXYzine  25 mg Oral QHS   insulin aspart  0-6 Units Subcutaneous TID WC   insulin aspart  2 Units Subcutaneous TID WC   isosorbide mononitrate  30 mg Oral Daily   metoprolol succinate  25 mg Oral Daily   Continuous Infusions:  sodium chloride Stopped (11/16/20 1305)   cefTRIAXone (ROCEPHIN)  IV Stopped (11/16/20 2359)   doxycycline (VIBRAMYCIN) IV 100 mg (11/17/20 0011)     Bonnielee Haff, MD [facility] 11/17/2020, 10:38 AM

## 2020-11-18 DIAGNOSIS — N184 Chronic kidney disease, stage 4 (severe): Secondary | ICD-10-CM | POA: Diagnosis not present

## 2020-11-18 LAB — BASIC METABOLIC PANEL
Anion gap: 8 (ref 5–15)
BUN: 27 mg/dL — ABNORMAL HIGH (ref 6–20)
CO2: 21 mmol/L — ABNORMAL LOW (ref 22–32)
Calcium: 8.1 mg/dL — ABNORMAL LOW (ref 8.9–10.3)
Chloride: 108 mmol/L (ref 98–111)
Creatinine, Ser: 3.72 mg/dL — ABNORMAL HIGH (ref 0.61–1.24)
GFR, Estimated: 19 mL/min — ABNORMAL LOW (ref >60)
Glucose, Bld: 125 mg/dL — ABNORMAL HIGH (ref 70–99)
Potassium: 4.6 mmol/L (ref 3.5–5.1)
Sodium: 137 mmol/L (ref 135–145)

## 2020-11-18 LAB — GLUCOSE, CAPILLARY
Glucose-Capillary: 156 mg/dL — ABNORMAL HIGH (ref 70–99)
Glucose-Capillary: 195 mg/dL — ABNORMAL HIGH (ref 70–99)

## 2020-11-18 LAB — CBC
HCT: 27.9 % — ABNORMAL LOW (ref 39.0–52.0)
Hemoglobin: 9.1 g/dL — ABNORMAL LOW (ref 13.0–17.0)
MCH: 25.9 pg — ABNORMAL LOW (ref 26.0–34.0)
MCHC: 32.6 g/dL (ref 30.0–36.0)
MCV: 79.5 fL — ABNORMAL LOW (ref 80.0–100.0)
Platelets: 280 10*3/uL (ref 150–400)
RBC: 3.51 MIL/uL — ABNORMAL LOW (ref 4.22–5.81)
RDW: 14.9 % (ref 11.5–15.5)
WBC: 6.8 10*3/uL (ref 4.0–10.5)
nRBC: 0 % (ref 0.0–0.2)

## 2020-11-18 MED ORDER — CEFDINIR 300 MG PO CAPS: 300.0000 mg | ORAL_CAPSULE | Freq: Every day | ORAL | 0 refills | Status: AC

## 2020-11-18 MED ORDER — METOCLOPRAMIDE HCL 5 MG PO TABS: 5.0000 mg | ORAL_TABLET | Freq: Three times a day (TID) | ORAL | 1 refills | Status: DC

## 2020-11-18 MED ORDER — LANTUS SOLOSTAR 100 UNIT/ML ~~LOC~~ SOPN: 5.0000 [IU] | PEN_INJECTOR | Freq: Every day | SUBCUTANEOUS | 3 refills | Status: DC

## 2020-11-18 MED ORDER — CEFDINIR 300 MG PO CAPS
300.0000 mg | ORAL_CAPSULE | Freq: Every day | ORAL | Status: DC
  Administered 2020-11-18: 300 mg via ORAL
  Filled 2020-11-18: qty 1

## 2020-11-18 NOTE — Plan of Care (Signed)
  Problem: Health Behavior/Discharge Planning: Goal: Ability to manage health-related needs will improve Outcome: Progressing   Problem: Clinical Measurements: Goal: Respiratory complications will improve Outcome: Progressing Goal: Cardiovascular complication will be avoided Outcome: Progressing   Problem: Elimination: Goal: Will not experience complications related to urinary retention Outcome: Progressing

## 2020-11-18 NOTE — Discharge Summary (Signed)
[facility]  Physician Discharge Summary   Patient ID: Timothy [name] MRN: [id number] DOB/AGE: 49-Mar-1972 49 y.o.  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  He was told that he will need blood work to check his kidney function sometime this week. Diuretics held for now.  Can be resumed based on blood work in the outpatient setting.   Home Health: None Equipment/Devices: None  CODE STATUS: Full code  DISCHARGE CONDITION: fair  Diet recommendation: As before  INITIAL HISTORY: 50 year old male with history of CKD stage IV, diabetes mellitus type 2, hypertension and chronic diastolic CHF presented with worsening shortness of breath, cough and hemoptysis.  On presentation, he was mildly tachypneic but afebrile.  Chest x-ray showed question of developing infection.  CT of the chest showed bibasilar pulmonary infiltrates concerning for multifocal pneumonia or aspiration.  COVID and influenza tests were negative.  Creatinine of 3.56; D-dimer of 0.86, BNP of 243 and high sensitive troponin was 30x2.  He was started on Rocephin and doxycycline.    HOSPITAL COURSE:   Multifocal pneumonia/Hemoptysis, most likely from pneumonia -Presented with worsening shortness of breath, cough and hemoptysis.  CT findings consistent with multifocal pneumonia -COVID-19 and influenza testing negative on presentation -Strep pneumo urinary antigen negative. -Placed on  ceftriaxone and doxycycline.  He was transitioned to oral doxycycline which he did not tolerate.  He attributed his symptoms of nausea vomiting to the doxycycline.  This was subsequently stopped.  He was changed over to [facility] from ceftriaxone which he seems to be tolerating well.   Hemoptysis has resolved.  VQ scan was low probability for PE.  Lower extremity Doppler studies were negative for DVT. Procalcitonin had improved to 2.6.  Saturating normal on room air.  Acute on CKD stage IV -Presented with creatinine of 3.56, up from 3.14 last month and 2.55 in June 2022.   Creatinine had improved to 2.91 but then noted to be 4.21 on 10/13.  Started on IV hydration.  Improvement in creatinine noted.  He was offered another 24-hour stay in the hospital for IV fluids but the patient wanted to go home today.  He was quite adamant about it.  He was asked to stay well-hydrated.  He was told that it would be very important for him to have his blood work done sometime in the next few days to have his kidney function checked.  He promised to call his PCP and nephrologist on Monday to facilitate this.   CT scan of the abdomen done recently did not show any hydronephrosis.     Nausea and vomiting Possibly multifactorial including pneumonia as well as renal failure.  Abdomen remains benign.  CT scan did not show any acute findings.  Gastroparesis is another possibility.  Patient given Reglan with improvement in symptoms.   Anemia of chronic disease Hemoglobin stable.  No signs of bleeding   Chronic diastolic CHF Furosemide held for now due to his acute kidney injury. Other medications including metoprolol hydralazine and Imdur being continued.  Seems to be well compensated and  actually was somewhat on the hypovolemic side in the hospital.   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  Resume home medication regimen.  Obesity Estimated body mass index is 31.03 kg/m as calculated from the following:   Height as of this encounter: '5\' 6"'$  (1.676 m).   Weight as of this encounter: 87.2 kg.  Patient is stable.  Adamant about going home today.  Tolerated his breakfast.  Tolerated oral antibiotic this morning.  Reasonable to discharge him home at this time with close outpatient follow-up.    PERTINENT LABS:  The results of significant diagnostics from this hospitalization (including imaging, microbiology, ancillary and laboratory) are listed below for reference.    Microbiology: Recent Results (from the past 240 hour(s))  Resp Panel by RT-PCR (Flu A&B, Covid) Nasopharyngeal Swab     Status: None   Collection Time: 11/14/20 11:42 PM   Specimen: Nasopharyngeal Swab; Nasopharyngeal(NP) swabs in vial transport medium  Result Value Ref Range Status   SARS Coronavirus 2 by RT PCR NEGATIVE NEGATIVE Final    Comment: (NOTE) SARS-CoV-2 target nucleic acids are NOT DETECTED.  The SARS-CoV-2 RNA is generally detectable in upper respiratory specimens during the acute phase of infection. The lowest concentration of SARS-CoV-2 viral copies this assay can detect is 138 copies/mL. A negative result does not preclude SARS-Cov-2 infection and should not be used as the sole basis for treatment or other patient management decisions. A negative result may occur with  improper specimen collection/handling, submission of specimen other than nasopharyngeal swab, presence of viral mutation(s) within the areas targeted by this assay, and inadequate number of viral copies(<138 copies/mL). A negative result must be combined with clinical observations, patient history, and epidemiological information. The expected result is Negative.  Fact Sheet for Patients:  EntrepreneurPulse.com.au  Fact Sheet for Healthcare Providers:   IncredibleEmployment.be  This test is no t yet approved or cleared by the [place] FDA and  has been authorized for detection and/or diagnosis of SARS-CoV-2 by FDA under an Emergency Use Authorization (EUA). This EUA will remain  in effect (meaning this test can be used) for the duration of the COVID-19 declaration under Section 564(b)(1) of the Act, 21 U.S.C.section 360bbb-3(b)(1), unless the authorization is terminated  or revoked sooner.       Influenza A by PCR NEGATIVE NEGATIVE Final   Influenza B by PCR NEGATIVE NEGATIVE Final    Comment: (NOTE) The Xpert Xpress SARS-CoV-2/FLU/RSV plus assay is intended as an aid in the diagnosis of influenza from Nasopharyngeal swab specimens and should not be used as a sole basis for treatment. Nasal washings and aspirates are unacceptable for Xpert Xpress SARS-CoV-2/FLU/RSV testing.  Fact Sheet for Patients: EntrepreneurPulse.com.au  Fact Sheet for Healthcare Providers: IncredibleEmployment.be  This test is not yet approved or cleared by the [place] FDA and has been authorized for detection and/or diagnosis of SARS-CoV-2 by FDA under an Emergency Use Authorization (EUA). This EUA will remain in effect (meaning this test can be used) for the duration of the COVID-19 declaration under Section 564(b)(1) of the Act, 21 U.S.C. section 360bbb-3(b)(1), unless the authorization is terminated or revoked.  Performed at [facility], [place] [address], [place], [place] [address]      Labs:  COVID-19 Labs   Lab Results  Component Value Date   [place] 11/14/2020   [place] NEGATIVE 06/22/2020  [place] NEGATIVE 06/19/2019      Basic Metabolic Panel: Recent Labs  Lab 11/15/20 0300 11/16/20 0436 11/16/20 0808 11/17/20 0433 11/18/20 0457  NA 136 139 140 135 137  K 4.6 4.7 4.8 4.4 4.6  CL 106 110 111 108 108  CO2 '23 22 22 '$ 19*  21*  GLUCOSE 132* 157* 159* 139* 125*  BUN 31* 33* 34* 29* 27*  CREATININE 2.91* 4.21* 4.22* 3.97* 3.72*  CALCIUM 7.9* 8.1* 8.3* 7.6* 8.1*  MG  --  1.7  --  1.6*  --    Liver Function Tests: Recent Labs  Lab 11/14/20 2244  AST 24  ALT 20  ALKPHOS 54  BILITOT 0.9  PROT 7.7  ALBUMIN 3.2*    CBC: Recent Labs  Lab 11/14/20 2244 11/15/20 0300 11/16/20 0436 11/17/20 0433 11/18/20 0457  WBC 10.0 9.2 6.2 8.0 6.8  HGB 11.0* 9.7* 9.4* 9.2* 9.1*  HCT 32.5* 29.2* 29.2* 29.1* 27.9*  MCV 77.4* 78.1* 79.3* 81.5 79.5*  PLT 320 282 268 262 280    BNP: BNP (last 3 results) Recent Labs    06/22/20 1012 07/05/20 1036 11/14/20 2244  BNP 218.6* 150.2* 243.3*    CBG: Recent Labs  Lab 11/17/20 1202 11/17/20 1559 11/17/20 2046 11/18/20 0724 11/18/20 1146  GLUCAP 161* 112* 165* 156* 195*     IMAGING STUDIES DG Chest 2 View  Result Date: 11/14/2020 CLINICAL DATA:  Back pain EXAM: CHEST - 2 VIEW COMPARISON:  06/22/2020 FINDINGS: Mild left basilar opacity. No pleural effusion or pneumothorax. Cardiomediastinal contours are normal. IMPRESSION: Mild left basilar opacity, possibly developing infection. Electronically Signed   By: Ulyses Jarred M.D.   On: 11/14/2020 22:15   NM Pulmonary Perfusion  Result Date: 11/15/2020 CLINICAL DATA:  Chest pain, shortness of breath, elevated D-dimer. PE suspected, low/intermediate prob, positive D-dimer EXAM: NUCLEAR MEDICINE PERFUSION LUNG SCAN TECHNIQUE: Perfusion images were obtained in multiple projections after intravenous injection of radiopharmaceutical. Ventilation scans intentionally deferred if perfusion scan and chest x-ray adequate for interpretation during COVID 19 epidemic. RADIOPHARMACEUTICALS:  4.3 mCi Tc-31mMAA IV COMPARISON:  Chest x-ray 11/14/2020 FINDINGS: Physiologic distribution of radiotracer. Slightly heterogeneous perfusion within the left lung base corresponding to left basilar opacity seen radiographically. No segmental  perfusion defects. IMPRESSION: Low probability for pulmonary embolism. Electronically Signed   By: NDavina PokeD.O.   On: 11/15/2020 10:56   CT Renal Stone Study  Result Date: 11/15/2020 CLINICAL DATA:  Bilateral flank pain, acute renal insufficiency EXAM: CT ABDOMEN AND PELVIS WITHOUT CONTRAST TECHNIQUE: Multidetector CT imaging of the abdomen and pelvis was performed following the standard protocol without IV contrast. COMPARISON:  None. FINDINGS: Lower chest: There is multifocal pulmonary infiltrate and consolidation within the visualized lung bases, most severe within the left lower lobe, in keeping with aspiration or multifocal infection. No pleural effusion. Visualized heart and pericardium are unremarkable. Hepatobiliary: No focal liver abnormality is seen. No gallstones, gallbladder wall thickening, or biliary dilatation. Pancreas: Unremarkable Spleen: Unremarkable Adrenals/Urinary Tract: Adrenal glands are unremarkable. Kidneys are normal, without renal calculi, focal lesion, or hydronephrosis. Bladder is unremarkable. Stomach/Bowel: Stomach is within normal limits. Appendix appears normal. No evidence of bowel wall thickening, distention, or inflammatory changes. No free intraperitoneal gas or fluid. Vascular/Lymphatic: Aortic atherosclerosis. No enlarged abdominal or pelvic lymph nodes. Reproductive: Prostate is unremarkable. Other: Tiny fat containing umbilical and right inguinal hernias are present. Rectum unremarkable. Musculoskeletal: No acute or significant osseous findings. IMPRESSION: Extensive bibasilar pulmonary infiltrate consolidation, most severe within the left  lower lobe, in keeping with multifocal infection or aspiration. No acute intra-abdominal pathology identified. Aortic Atherosclerosis (ICD10-I70.0). Electronically Signed   By: Fidela Salisbury M.D.   On: 11/15/2020 00:06   VAS [place] LOWER EXTREMITY VENOUS (DVT)  Result Date: 11/15/2020  Lower Venous DVT Study Patient Name:   Dristin Degon II  Date of Exam:   11/15/2020 Medical Rec #: [id number]              Accession #:    [id number] Date of Birth: November 17, 1970              Patient Gender: M Patient Age:   55 years Exam Location:  [facility] Procedure:      VAS [place] LOWER EXTREMITY VENOUS (DVT) Referring Phys: TIMOTHY OPYD --------------------------------------------------------------------------------  Indications: Elevated Ddimer.  Risk Factors: None identified. Comparison Study: No prior studies. Performing Technologist: Oliver Hum RVT  Examination Guidelines: A complete evaluation includes B-mode imaging, spectral Doppler, color Doppler, and power Doppler as needed of all accessible portions of each vessel. Bilateral testing is considered an integral part of a complete examination. Limited examinations for reoccurring indications may be performed as noted. The reflux portion of the exam is performed with the patient in reverse Trendelenburg.  +---------+---------------+---------+-----------+----------+--------------+ RIGHT    CompressibilityPhasicitySpontaneityPropertiesThrombus Aging +---------+---------------+---------+-----------+----------+--------------+ CFV      Full           Yes      Yes                                 +---------+---------------+---------+-----------+----------+--------------+ SFJ      Full                                                        +---------+---------------+---------+-----------+----------+--------------+ FV Prox  Full                                                        +---------+---------------+---------+-----------+----------+--------------+ FV Mid   Full                                                        +---------+---------------+---------+-----------+----------+--------------+ FV DistalFull                                                        +---------+---------------+---------+-----------+----------+--------------+ PFV       Full                                                        +---------+---------------+---------+-----------+----------+--------------+ POP      Full  Yes      Yes                                 +---------+---------------+---------+-----------+----------+--------------+ PTV      Full                                                        +---------+---------------+---------+-----------+----------+--------------+ PERO     Full                                                        +---------+---------------+---------+-----------+----------+--------------+   +---------+---------------+---------+-----------+----------+--------------+ LEFT     CompressibilityPhasicitySpontaneityPropertiesThrombus Aging +---------+---------------+---------+-----------+----------+--------------+ CFV      Full           Yes      Yes                                 +---------+---------------+---------+-----------+----------+--------------+ SFJ      Full                                                        +---------+---------------+---------+-----------+----------+--------------+ FV Prox  Full                                                        +---------+---------------+---------+-----------+----------+--------------+ FV Mid   Full                                                        +---------+---------------+---------+-----------+----------+--------------+ FV DistalFull                                                        +---------+---------------+---------+-----------+----------+--------------+ PFV      Full                                                        +---------+---------------+---------+-----------+----------+--------------+ POP      Full           Yes      Yes                                 +---------+---------------+---------+-----------+----------+--------------+ PTV  Full                                                         +---------+---------------+---------+-----------+----------+--------------+ PERO     Full                                                        +---------+---------------+---------+-----------+----------+--------------+     Summary: RIGHT: - There is no evidence of deep vein thrombosis in the lower extremity.  - No cystic structure found in the popliteal fossa.  LEFT: - There is no evidence of deep vein thrombosis in the lower extremity.  - No cystic structure found in the popliteal fossa.  *See table(s) above for measurements and observations. Electronically signed by Harold Barban MD on 11/15/2020 at 9:22:57 PM.    Final     DISCHARGE EXAMINATION: Vitals:   11/17/20 2043 11/18/20 0428 11/18/20 0500 11/18/20 0919  BP: 120/69 139/79  (!) 145/86  Pulse: 81 79  75  Resp: 16 18    Temp: 99.1 F (37.3 C) 98.9 F (37.2 C)  98.2 F (36.8 C)  TempSrc:  Oral  Oral  SpO2: 95% 98%  96%  Weight:   87.2 kg   Height:       General appearance: Awake alert.  In no distress Resp: Clear to auscultation bilaterally.  Normal effort Cardio: S1-S2 is normal regular.  No S3-S4.  No rubs murmurs or bruit GI: Abdomen is soft.  Nontender nondistended.  Bowel sounds are present normal.  No masses organomegaly    DISPOSITION: Home  Discharge Instructions     Call MD for:  difficulty breathing, headache or visual disturbances   Complete by: As directed    Call MD for:  extreme fatigue   Complete by: As directed    Call MD for:  persistant dizziness or light-headedness   Complete by: As directed    Call MD for:  persistant nausea and vomiting   Complete by: As directed    Call MD for:  severe uncontrolled pain   Complete by: As directed    Call MD for:  temperature >100.4   Complete by: As directed    Diet - low sodium heart healthy   Complete by: As directed    Discharge instructions   Complete by: As directed    You will need to have blood work by mid next week to check  your kidney function. Please reach out to either your nephrologist or your PCP to arrange.  You were cared for by a hospitalist during your hospital stay. If you have any questions about your discharge medications or the care you received while you were in the hospital after you are discharged, you can call the unit and asked to speak with the hospitalist on call if the hospitalist that took care of you is not available. Once you are discharged, your primary care physician will handle any further medical issues. Please note that NO REFILLS for any discharge medications will be authorized once you are discharged, as it is imperative that you return to your primary care physician (or establish a relationship with a primary care physician  if you do not have one) for your aftercare needs so that they can reassess your need for medications and monitor your lab values. If you do not have a primary care physician, you can call [phone] for a physician referral.   Increase activity slowly   Complete by: As directed         Allergies as of 11/18/2020   No Known Allergies      Medication List     STOP taking these medications    furosemide 40 MG tablet Commonly known as: LASIX   furosemide 80 MG tablet Commonly known as: LASIX       TAKE these medications    amLODipine 5 MG tablet Commonly known as: NORVASC Take 5 mg by mouth daily.   atorvastatin 40 MG tablet Commonly known as: LIPITOR Take 1 tablet (40 mg total) by mouth daily.   CareFine Pen Needles 32G X 4 MM Misc Generic drug: Insulin Pen Needle 1 applicator by Does not apply route See admin instructions.   cefdinir 300 MG capsule Commonly known as: OMNICEF Take 1 capsule (300 mg total) by mouth daily for 5 days.   Contour Next Test test strip Generic drug: glucose blood Use as instructed   hydrALAZINE 50 MG tablet Commonly known as: APRESOLINE Take 1 tablet (50 mg total) by mouth 3 (three) times daily.   hydrOXYzine  25 MG capsule Commonly known as: VISTARIL Take 25 mg by mouth at bedtime.   isosorbide mononitrate 30 MG 24 hr tablet Commonly known as: IMDUR Take 1 tablet (30 mg total) by mouth daily.   Lancets Micro Thin 33G Misc 1 each by Does not apply route in the morning, at noon, in the evening, and at bedtime.   Lantus SoloStar 100 UNIT/ML Solostar Pen Generic drug: insulin glargine Inject 5 Units into the skin daily. Inject as directed What changed: how much to take   metoCLOPramide 5 MG tablet Commonly known as: REGLAN Take 1 tablet (5 mg total) by mouth 3 (three) times daily before meals.   metoprolol succinate 25 MG 24 hr tablet Commonly known as: TOPROL-XL Take 25 mg by mouth daily.          Follow-up Information     [name], [name], MD. Schedule an appointment as soon as possible for a visit in 1 week(s).   Specialty: Family Medicine Contact information: [address] [place] [address] [phone]                 TOTAL DISCHARGE TIME: 34 minutes  [facility] Pager on [email or web address]  11/19/2020, 10:54 AM

## 2020-11-20 ENCOUNTER — Encounter (INDEPENDENT_AMBULATORY_CARE_PROVIDER_SITE_OTHER): Payer: 59 | Admitting: Ophthalmology

## 2020-11-30 ENCOUNTER — Ambulatory Visit: Payer: 59

## 2020-11-30 ENCOUNTER — Telehealth: Payer: Self-pay

## 2020-11-30 NOTE — Telephone Encounter (Signed)
Unable to r/s missed appt

## 2020-11-30 NOTE — Progress Notes (Deleted)
Patient ID: Timothy [name]                 DOB: [birth year]                      MRN: [id number]     HPI: Timothy [name] is a 50 y.o. male referred by Dr. Gardiner Rhyme to pharmacy clinic for HF medication management. PMH is significant for CHF, DM (A1c 8.1%) , CKD, . Most recent LVEF 60-65% on 5/19.  Today she returns to pharmacy clinic for further medication titration. At last visit with MD Gardiner Rhyme, amlodipine was added.  . Symptomatically, she is feeling ***, *** dizziness, lightheadedness, and fatigue. *** chest pain or palpitations. Feels SOB when ***. Able to complete all ADLs. Activity level ***. She *** checks her weight at home (normal range *** - *** lbs). *** LEE, PND, or orthopnea. Appetite has been ***. She *** adheres to a low-salt diet.  Current CHF meds:  Hydralazine 100mg  TID Isosorbide 30mg  daily TID Toprol 25mg  daily  HTN meds: Amlodipine 5 mg daily  Previously tried:  BP goal:   Family History:   Social History:   Diet:   Exercise:   Home BP readings:   Wt Readings from Last 3 Encounters:  11/18/20 192 lb 3.9 oz (87.2 kg)  11/09/20 193 lb 9.6 oz (87.8 kg)  10/26/20 189 lb 11.2 oz (86 kg)   BP Readings from Last 3 Encounters:  11/18/20 (!) 145/86  11/09/20 (!) 179/104  10/26/20 (!) 170/90   Pulse Readings from Last 3 Encounters:  11/18/20 75  11/09/20 82  10/26/20 74    Renal function: Estimated Creatinine Clearance: 24.9 mL/min (A) (by C-G formula based on SCr of 3.72 mg/dL (H)).  Past Medical History:  Diagnosis Date   CHF (congestive heart failure) (HCC)    Diabetes mellitus without complication ([place])     Current Outpatient Medications on File Prior to Visit  Medication Sig Dispense Refill   amLODipine (NORVASC) 5 MG tablet Take 5 mg by mouth daily.     atorvastatin (LIPITOR) 40 MG tablet Take 1 tablet (40 mg total) by mouth daily. (Patient not taking: No sig reported) 30 tablet 2   glucose blood (CONTOUR NEXT TEST) test strip  Use as instructed 100 each 12   hydrALAZINE (APRESOLINE) 50 MG tablet Take 1 tablet (50 mg total) by mouth 3 (three) times daily. 270 tablet 3   hydrOXYzine (VISTARIL) 25 MG capsule Take 25 mg by mouth at bedtime.     insulin glargine (LANTUS SOLOSTAR) 100 UNIT/ML Solostar Pen Inject 5 Units into the skin daily. Inject as directed 15 mL 3   Insulin Pen Needle (CAREFINE PEN NEEDLES) 32G X 4 MM MISC 1 applicator by Does not apply route See admin instructions. 50 each 1   isosorbide mononitrate (IMDUR) 30 MG 24 hr tablet Take 1 tablet (30 mg total) by mouth daily. 30 tablet 2   Lancets Micro Thin 33G MISC 1 each by Does not apply route in the morning, at noon, in the evening, and at bedtime. 100 each 12   metoCLOPramide (REGLAN) 5 MG tablet Take 1 tablet (5 mg total) by mouth 3 (three) times daily before meals. 90 tablet 1   metoprolol succinate (TOPROL-XL) 25 MG 24 hr tablet Take 25 mg by mouth daily.     No current facility-administered medications on file prior to visit.    No Known Allergies   Assessment/Plan:  1.  CHF -

## 2020-12-07 ENCOUNTER — Other Ambulatory Visit: Payer: Self-pay

## 2020-12-07 ENCOUNTER — Ambulatory Visit (INDEPENDENT_AMBULATORY_CARE_PROVIDER_SITE_OTHER): Payer: 59 | Admitting: Family Medicine

## 2020-12-07 VITALS — BP 206/120 | Ht 66.0 in | Wt 185.0 lb

## 2020-12-07 DIAGNOSIS — I10 Essential (primary) hypertension: Secondary | ICD-10-CM

## 2020-12-07 DIAGNOSIS — N184 Chronic kidney disease, stage 4 (severe): Secondary | ICD-10-CM

## 2020-12-07 DIAGNOSIS — E1122 Type 2 diabetes mellitus with diabetic chronic kidney disease: Secondary | ICD-10-CM | POA: Diagnosis not present

## 2020-12-07 MED ORDER — LANTUS SOLOSTAR 100 UNIT/ML ~~LOC~~ SOPN: 5.0000 [IU] | PEN_INJECTOR | Freq: Every day | SUBCUTANEOUS | 3 refills | Status: DC

## 2020-12-07 MED ORDER — CAREFINE PEN NEEDLES 32G X 4 MM MISC
1.0000 | 1 refills | Status: DC
Start: 2020-12-07 — End: 2021-07-08

## 2020-12-07 NOTE — Assessment & Plan Note (Addendum)
Blood pressure continues to remain elevated Saw nephrology on October 25, had some medications at that time, was instructed to monitor blood pressure and contact their office within a week of follow-up to report back what his blood pressure was, he has not done so yet Denies any current issues with chest pain, headaches Currently taking hydralazine 100 mg, metoprolol 50 mg, Imdur 30 mg, amlodipine 5 mg, furosemide 80 mg We did attempt to contact nephrology office while patient was in clinic, however no answer, left a message to discuss patient's blood pressure.  Instructed patient to contact nephrology office to discuss blood pressure and was specialist had recommended Recheck of blood pressure today with slight improvement on initial reading, now 176/118 Next appointment with nephrology is later this month

## 2020-12-07 NOTE — Assessment & Plan Note (Signed)
Continues to follow with nephrology Was admitted to the hospital recently due to pneumonia, did have some worsening of kidney function found on labs while in the hospital Has followed up with nephrology as above Continue with recommendations as per nephrology, labs completed most recent with nephrology on October 25

## 2020-12-07 NOTE — Assessment & Plan Note (Signed)
Reports recent fasting blood sugars around 110s Currently only taking Lantus 5 units daily, needing refill today Will be due for recheck of hemoglobin A1c in about a month or so

## 2020-12-07 NOTE — Progress Notes (Signed)
    Procedures performed today:    None.  Independent interpretation of notes and tests performed by another provider:   None.  Brief History, Exam, Impression, and Recommendations:    BP (!) 206/120   Ht 5\' 6"  (1.676 m)   Wt 185 lb (83.9 kg)   BMI 29.86 kg/m   Accelerated hypertension Blood pressure continues to remain elevated Saw nephrology on October 25, had some medications at that time, was instructed to monitor blood pressure and contact their office within a week of follow-up to report back what his blood pressure was, he has not done so yet Denies any current issues with chest pain, headaches Currently taking hydralazine 100 mg, metoprolol 50 mg, Imdur 30 mg, amlodipine 5 mg, furosemide 80 mg We did attempt to contact nephrology office while patient was in clinic, however no answer, left a message to discuss patient's blood pressure.  Instructed patient to contact nephrology office to discuss blood pressure and was specialist had recommended Recheck of blood pressure today with slight improvement on initial reading, now 176/118 Next appointment with nephrology is later this month  Type 2 diabetes mellitus ([place]) Reports recent fasting blood sugars around 110s Currently only taking Lantus 5 units daily, needing refill today Will be due for recheck of hemoglobin A1c in about a month or so  CKD (chronic kidney disease), stage IV ([place]) Continues to follow with nephrology Was admitted to the hospital recently due to pneumonia, did have some worsening of kidney function found on labs while in the hospital Has followed up with nephrology as above Continue with recommendations as per nephrology, labs completed most recent with nephrology on October 25  Plan for follow-up in about 4 to 6 weeks or sooner as needed   ___________________________________________ Janmarie Smoot [name], MD, ABFM, CAQSM Primary Care and [place]

## 2020-12-07 NOTE — Patient Instructions (Signed)
  Medication Instructions:  Your physician recommends that you continue on your current medications as directed. Please refer to the Current Medication list given to you today. --If you need a refill on any your medications before your next appointment, please call your pharmacy first. If no refills are authorized on file call the office.--  Follow-Up: Your next appointment:   Your physician recommends that you schedule a follow-up appointment in: 4-6 WEEKS with Dr. [name]  You will receive a text message or e-mail with a link to a survey about your care and experience with [place] today! We would greatly appreciate your feedback!   Thanks for letting us be apart of your health journey!!  Primary Care and Sports Medicine   Dr. Arlina Robes [place]   We encourage you to activate your patient portal called "MyChart".  Sign up information is provided on this After Visit Summary.  MyChart is used to connect with patients for Virtual Visits (Telemedicine).  Patients are able to view lab/test results, encounter notes, upcoming appointments, etc.  Non-urgent messages can be sent to your provider as well. To learn more about what you can do with MyChart, please visit --  [email or web address].

## 2021-01-18 ENCOUNTER — Encounter (HOSPITAL_BASED_OUTPATIENT_CLINIC_OR_DEPARTMENT_OTHER): Payer: Self-pay | Admitting: Family Medicine

## 2021-01-18 ENCOUNTER — Other Ambulatory Visit: Payer: Self-pay

## 2021-01-18 ENCOUNTER — Ambulatory Visit (INDEPENDENT_AMBULATORY_CARE_PROVIDER_SITE_OTHER): Payer: 59 | Admitting: Family Medicine

## 2021-01-18 VITALS — BP 192/128 | HR 85 | Ht 66.0 in | Wt 197.0 lb

## 2021-01-18 DIAGNOSIS — N529 Male erectile dysfunction, unspecified: Secondary | ICD-10-CM | POA: Diagnosis not present

## 2021-01-18 DIAGNOSIS — I10 Essential (primary) hypertension: Secondary | ICD-10-CM

## 2021-01-18 DIAGNOSIS — N184 Chronic kidney disease, stage 4 (severe): Secondary | ICD-10-CM | POA: Diagnosis not present

## 2021-01-18 DIAGNOSIS — E1122 Type 2 diabetes mellitus with diabetic chronic kidney disease: Secondary | ICD-10-CM

## 2021-01-18 DIAGNOSIS — Z794 Long term (current) use of insulin: Secondary | ICD-10-CM

## 2021-01-18 LAB — POCT GLYCOSYLATED HEMOGLOBIN (HGB A1C)
HbA1c POC (<> result, manual entry): 7.6 % (ref 4.0–5.6)
Hemoglobin A1C: 7.6 % — AB (ref 4.0–5.6)

## 2021-01-18 NOTE — Progress Notes (Signed)
° ° °  Procedures performed today:    None.  Independent interpretation of notes and tests performed by another provider:   None.  Brief History, Exam, Impression, and Recommendations:    BP (!) 192/128    Pulse 85    Ht 5\' 6"  (1.676 m)    Wt 197 lb (89.4 kg)    SpO2 98%    BMI 31.80 kg/m   Erectile dysfunction Reports that this has been going on for at least 6 months Difficulty with achieving and maintaining erection Discussed that underlying chronic medical health conditions likely contribute to this Given poor control of chronic medical issues, particularly with difficulty with controlling blood pressure, advancing chronic kidney disease, treatment options may be limited Will refer to urology for further evaluation and assistance with treatment  Accelerated hypertension Blood pressure continues to be elevated Reports that he has followed up with nephrology since our last visit, will need to request office notes Seems to be some question of whether he is taking all medications as prescribed Will have patient bring all prescriptions to next office visit to review what he has and what is being taken in order to ensure appropriate adherence Also recommend patient reach out to cardiology.  Her.  She was scheduled to meet with pharmacist through heart failure clinic, however was not able to keep appointment.  Type 2 diabetes mellitus (HCC) Will check hemoglobin A1c today to assess current status As above, will bring all medications to next office visit to review  Plan for follow-up in 4 to 6 weeks or sooner as needed   ___________________________________________ Denaly Gatling [name], MD, ABFM, CAQSM Primary Care and [place]

## 2021-01-18 NOTE — Assessment & Plan Note (Addendum)
Will check hemoglobin A1c today to assess current status As above, will bring all medications to next office visit to review

## 2021-01-18 NOTE — Patient Instructions (Addendum)
°  Medication Instructions:  Your physician recommends that you continue on your current medications as directed. Please refer to the Current Medication list given to you today. --If you need a refill on any your medications before your next appointment, please call your pharmacy first. If no refills are authorized on file call the office.-- Lab Work: Your physician has recommended that you have lab work today: A1C If you have labs (blood work) drawn today and your tests are completely normal, you will receive your results via [place] a phone call from our staff.  Please ensure you check your voicemail in the event that you authorized detailed messages to be left on a delegated number. If you have any lab test that is abnormal or we need to change your treatment, we will call you to review the results.  Referrals/Procedures/Imaging: A referral has been placed for you to [facility] for evaluation and treatment. Someone from the scheduling department will be in contact with you in regards to coordinating your consultation. If you do not hear from any of the schedulers within 7-10 business days please give their office a call.  [facility] [place] [phone]  Follow-Up: Your next appointment:   Your physician recommends that you schedule a follow-up appointment in: 4-6 WEEKS with Dr. [name]  -- Contact your Cardiology office to reschedule your missed appointment --  -- Bring all your medications (bottles) in with you at your next office visit --    You will receive a text message or e-mail with a link to a survey about your care and experience with [place] today! We would greatly appreciate your feedback!   Thanks for letting us be apart of your health journey!!  Primary Care and Sports Medicine   Dr. Arlina Robes [place]   We encourage you to activate your patient portal called "MyChart".  Sign up information is provided on this After Visit  Summary.  MyChart is used to connect with patients for Virtual Visits (Telemedicine).  Patients are able to view lab/test results, encounter notes, upcoming appointments, etc.  Non-urgent messages can be sent to your provider as well. To learn more about what you can do with MyChart, please visit --  [email or web address].     -- Contact your Cardiology office to reschedule your missed appointment --  -- Bring all your medications (bottles) in with you at your next office visit --

## 2021-01-18 NOTE — Assessment & Plan Note (Signed)
Reports that this has been going on for at least 6 months Difficulty with achieving and maintaining erection Discussed that underlying chronic medical health conditions likely contribute to this Given poor control of chronic medical issues, particularly with difficulty with controlling blood pressure, advancing chronic kidney disease, treatment options may be limited Will refer to urology for further evaluation and assistance with treatment

## 2021-01-18 NOTE — Assessment & Plan Note (Signed)
Blood pressure continues to be elevated Reports that he has followed up with nephrology since our last visit, will need to request office notes Seems to be some question of whether he is taking all medications as prescribed Will have patient bring all prescriptions to next office visit to review what he has and what is being taken in order to ensure appropriate adherence Also recommend patient reach out to cardiology.  Her.  She was scheduled to meet with pharmacist through heart failure clinic, however was not able to keep appointment.

## 2021-01-19 MED ORDER — LANTUS SOLOSTAR 100 UNIT/ML ~~LOC~~ SOPN: 5.0000 [IU] | PEN_INJECTOR | Freq: Every day | SUBCUTANEOUS | 3 refills | Status: DC

## 2021-01-19 NOTE — Addendum Note (Signed)
Addended by: Campbell Riches on: 01/19/2021 08:36 AM   Modules accepted: Orders

## 2021-02-15 ENCOUNTER — Ambulatory Visit (HOSPITAL_BASED_OUTPATIENT_CLINIC_OR_DEPARTMENT_OTHER): Payer: 59 | Admitting: Family Medicine

## 2021-02-16 ENCOUNTER — Telehealth: Payer: Self-pay | Admitting: Cardiology

## 2021-02-16 ENCOUNTER — Encounter (HOSPITAL_BASED_OUTPATIENT_CLINIC_OR_DEPARTMENT_OTHER): Payer: Self-pay | Admitting: Family Medicine

## 2021-02-16 ENCOUNTER — Ambulatory Visit (INDEPENDENT_AMBULATORY_CARE_PROVIDER_SITE_OTHER): Payer: 59 | Admitting: Family Medicine

## 2021-02-16 ENCOUNTER — Other Ambulatory Visit: Payer: Self-pay

## 2021-02-16 VITALS — BP 192/122 | HR 84 | Ht 66.0 in | Wt 198.0 lb

## 2021-02-16 DIAGNOSIS — N184 Chronic kidney disease, stage 4 (severe): Secondary | ICD-10-CM | POA: Diagnosis not present

## 2021-02-16 DIAGNOSIS — S8991XD Unspecified injury of right lower leg, subsequent encounter: Secondary | ICD-10-CM

## 2021-02-16 DIAGNOSIS — I10 Essential (primary) hypertension: Secondary | ICD-10-CM | POA: Diagnosis not present

## 2021-02-16 NOTE — Telephone Encounter (Signed)
Spoke with Patty Sermons CMA with primary care at [facility]  PCP wants to discuss med changes with Dr. Gardiner Rhyme - hypertensive, fluid overloaded - seen today   PCP can be reached at [phone] He would like patient to be scheduled for a follow up in a week or so

## 2021-02-16 NOTE — Telephone Encounter (Signed)
Pt c/o medication issue:  1. Name of Medication:   2. How are you currently taking this medication (dosage and times per day)?   3. Are you having a reaction (difficulty breathing--STAT)?   4. What is your medication issue? PCP wants to make changes to patient medication. Patient is in office.  Reach out to Nurse for Dr Gardiner Rhyme and DOD neither took the call. Clinic Sup advise reach out back out to [facility] but Dr office hung up by then. Please

## 2021-02-16 NOTE — Assessment & Plan Note (Signed)
Has previously had x-ray imaging ordered, however patient has not yet completed.  Again provided information for facility to obtain imaging Once report obtained after x-rays are completed, will discuss findings and recommended next steps in management

## 2021-02-16 NOTE — Telephone Encounter (Signed)
Attempted to call PCP office -- got VM  Called patient - left message

## 2021-02-16 NOTE — Patient Instructions (Signed)
°  Medication Instructions:  Your physician recommends that you continue on your current medications as directed. Please refer to the Current Medication list given to you today. --If you need a refill on any your medications before your next appointment, please call your pharmacy first. If no refills are authorized on file call the office.-- Referrals/Procedures/Imaging: Your physician recommends that you have an XRAY of your Knee. X-rays are a type of radiation called electromagnetic waves. X-ray imaging creates pictures of the inside of your body. The images show the parts of your body in different shades of black and white. This is because different tissues absorb different amounts of radiation. Calcium in bones absorbs x-rays the most, so bones look white. Fat and other soft tissues absorb less and look gray. Air absorbs the least, so lungs look black     1. You may have this done at the [facility], located in the [place] on the 1st floor.    2. You do no have to have an appointment.    3. [place], [place] [address]        [phone]        Monday - Friday  8:00 am - 5:00 pm 4. [facility] at [facility]. [facility] at [facility] is a premier diagnostic imaging center that offers a 64-slice CT scanner and interventional radiology services.   Follow-Up: Your next appointment:   Your physician recommends that you schedule a follow-up appointment in: 23 WEEKS with Dr. [name]  You will receive a text message or e-mail with a link to a survey about your care and experience with [place] today! We would greatly appreciate your feedback!   Thanks for letting us be apart of your health journey!!  Primary Care and Sports Medicine   Dr. Arlina Robes [place]   We encourage you to activate your patient portal called "MyChart".  Sign up information is provided on this After Visit  Summary.  MyChart is used to connect with patients for Virtual Visits (Telemedicine).  Patients are able to view lab/test results, encounter notes, upcoming appointments, etc.  Non-urgent messages can be sent to your provider as well. To learn more about what you can do with MyChart, please visit --  [email or web address].

## 2021-02-16 NOTE — Progress Notes (Signed)
° ° °  Procedures performed today:    None.  Independent interpretation of notes and tests performed by another provider:   None.  Brief History, Exam, Impression, and Recommendations:    BP (!) 192/122    Pulse 84    Ht 5\' 6"  (1.676 m)    Wt 198 lb (89.8 kg)    SpO2 96%    BMI 31.96 kg/m   Accelerated hypertension Continues to have elevated blood pressures in the office and at home.  He did not bring medications with him today for [place] to more completely review Has not had follow-up with cardiology or nephrology since last visit with me He has had some intermittent shortness of breath over the past several weeks.  More so notices this with exertion He reports being frustrated that his blood pressure continues to remain elevated.  I did discuss with him that given concern at last office visit that there may be some discrepancies between medication prescribed and what is being taken that bring his medications to the office would allow [place] to better reconcile medications and ensure that he is taking medications as prescribed.  I also discussed that more regular follow-up with specialist would aid in better control of medical conditions He has considered establishing with new specialist, indicates this may be with [facility], although no visit scheduled While patient was in the office, reached out to his cardiologist office in order to discuss concerns and try to have patient seen next week for further evaluation and management of blood pressure On exam, regular rate and rhythm.  Lungs with expiratory crackles bilaterally Patient likely may benefit from some diuresis given current intermittent shortness of breath and underlying CHF, however some concern given significant renal impairment  CKD (chronic kidney disease), stage IV (HCC) Patient's advanced chronic kidney disease increases complexity in regards to management of chronic medical conditions as well as medical issues.  Currently with some  mild exertional shortness of breath likely related to underlying CHF.  Does take Lasix 80 mg daily, thus further diuresis is not without risk given advanced CKD. He has not seen nephrologist since last appointment with me.  Recommend close follow-up with nephrology, would try to arrange appointment in the next 1 to 2 weeks.  Knee injury, right, subsequent encounter Has previously had x-ray imaging ordered, however patient has not yet completed.  Again provided information for facility to obtain imaging Once report obtained after x-rays are completed, will discuss findings and recommended next steps in management  Spent 45 minutes on this patient encounter, including preparation, chart review, face-to-face counseling with patient and coordination of care, and documentation of encounter  Plan for follow-up with me in about 4 weeks or sooner as needed Given advanced nature of his chronic medical conditions, close follow-up with specialist is imperative, emphasized this to patient.  It is also important that he bring all medications to his appointments with myself as well as specialist in order to ensure that all medications are being taken appropriately and as prescribed   ___________________________________________ Sudiksha Victor [name], MD, ABFM, [facility] Primary Care and [place]

## 2021-02-16 NOTE — Assessment & Plan Note (Signed)
Patient's advanced chronic kidney disease increases complexity in regards to management of chronic medical conditions as well as medical issues.  Currently with some mild exertional shortness of breath likely related to underlying CHF.  Does take Lasix 80 mg daily, thus further diuresis is not without risk given advanced CKD. He has not seen nephrologist since last appointment with me.  Recommend close follow-up with nephrology, would try to arrange appointment in the next 1 to 2 weeks.

## 2021-02-16 NOTE — Assessment & Plan Note (Signed)
Continues to have elevated blood pressures in the office and at home.  He did not bring medications with him today for [place] to more completely review Has not had follow-up with cardiology or nephrology since last visit with me He has had some intermittent shortness of breath over the past several weeks.  More so notices this with exertion He reports being frustrated that his blood pressure continues to remain elevated.  I did discuss with him that given concern at last office visit that there may be some discrepancies between medication prescribed and what is being taken that bring his medications to the office would allow [place] to better reconcile medications and ensure that he is taking medications as prescribed.  I also discussed that more regular follow-up with specialist would aid in better control of medical conditions He has considered establishing with new specialist, indicates this may be with [facility], although no visit scheduled While patient was in the office, reached out to his cardiologist office in order to discuss concerns and try to have patient seen next week for further evaluation and management of blood pressure On exam, regular rate and rhythm.  Lungs with expiratory crackles bilaterally Patient likely may benefit from some diuresis given current intermittent shortness of breath and underlying CHF, however some concern given significant renal impairment

## 2021-02-16 NOTE — Telephone Encounter (Signed)
Agree with adding on to my DOD schedule next week Thanks, Gerald Stabs

## 2021-02-19 NOTE — Telephone Encounter (Signed)
Left message for patient to call to schedule follow up appointment.

## 2021-02-21 NOTE — Telephone Encounter (Signed)
Attempted to call patient, unable to reach. Left message to call back to get scheduled.

## 2021-02-22 NOTE — Telephone Encounter (Signed)
Spoke with patient to inform him of his appointment with Dr. Gardiner Rhyme on 02/26/21 @11 :30 am. Patient stated, "Okay."

## 2021-02-22 NOTE — Telephone Encounter (Signed)
Patient returning call.

## 2021-02-26 ENCOUNTER — Ambulatory Visit: Payer: 59 | Admitting: Cardiology

## 2021-02-26 NOTE — Progress Notes (Deleted)
Cardiology Office Note:    Date:  02/26/2021   ID:  Timothy [name], DOB 03/09/1970, MRN [id number]  PCP:  [name], [name], MD  Cardiologist:  None  Electrophysiologist:  None   Referring MD: [name], [name], MD   No chief complaint on file.    History of Present Illness:    Timothy [name] is a 51 y.o. male with a hx of chronic diastolic heart failure, hypertension, T2DM who presents as a follow-up for heart failure.  He was admitted from 5/19 through 06/24/2020 with pneumonia/acute on chronic diastolic heart failure.  Presented with fever to 101.6, SPO2 86% on room air, creatinine 2.71, WBC 12.2.  Chest x-ray showed multifocal pneumonia with concern for hypersensitivity pneumonitis versus pulmonary edema.  He was treated with IV antibiotics and Lasix.  COVID-19 was negative.  He was diuresed with IV Lasix 40 mg twice daily and discharged on p.o. Lasix 40 mg daily.  Creatinine 2.73 on discharge.  Echocardiogram 06/19/2019 showed normal biventricular function, severe LVH, mild MR/AI.  Echocardiogram 06/22/2020 showed normal biventricular function, indeterminate diastolic function, moderate LVH, RVSP 25, RAP 3, no significant valvular disease.  Since last clinic visit, he was admitted in October 2022 with multifocal pneumonia.  Had worsening renal function, his diuretics were held.  he reports he has been doing okay.  He was seen by nephrology, planned renal biopsy but canceled due to the BP being too high.  Reports losartan increased to 50 mg daily, and has been increased to 50 mg daily.  Denies any chest pain, dyspnea, lightheadedness, syncope, lower extremity edema, or palpitations.  Does not weigh himself at home. Walks outside daily for 2 hours.  Denies any exertional symptoms.  Reports BP has been in 160s when he checks at home.   Wt Readings from Last 3 Encounters:  02/16/21 198 lb (89.8 kg)  01/18/21 197 lb (89.4 kg)  12/07/20 185 lb (83.9 kg)     Past Medical  History:  Diagnosis Date   CHF (congestive heart failure) ([place])    Diabetes mellitus without complication ([place])     Past Surgical History:  Procedure Laterality Date   EYE SURGERY      Current Medications: No outpatient medications have been marked as taking for the 02/26/21 encounter (Appointment) with Donato Heinz, MD.     Allergies:   Patient has no known allergies.   Social History   Socioeconomic History   Marital status: Single    Spouse name: Not on file   Number of children: Not on file   Years of education: Not on file   Highest education level: Not on file  Occupational History   Not on file  Tobacco Use   Smoking status: Never   Smokeless tobacco: Never  Vaping Use   Vaping Use: Never used  Substance and Sexual Activity   Alcohol use: Not Currently   Drug use: Not Currently   Sexual activity: Not on file  Other Topics Concern   Not on file  Social History Narrative   Not on file   Social Determinants of Health   Financial Resource Strain: Not on file  Food Insecurity: Not on file  Transportation Needs: Not on file  Physical Activity: Not on file  Stress: Not on file  Social Connections: Not on file     Family History: The patient's family history is not on file.  ROS:   Please see the history of present illness.  All other systems reviewed and are negative.  EKGs/Labs/Other Studies Reviewed:    The following studies were reviewed today:   EKG:   07/05/2020- The EKG ordered today demonstrates Sinus rhythm, rate 79, LVH with repolarization changes, T wave inversions in leads 1 and 2, AVF  Recent Labs: 11/14/2020: ALT 20; B Natriuretic Peptide 243.3 11/17/2020: Magnesium 1.6 11/18/2020: BUN 27; Creatinine, Ser 3.72; Hemoglobin 9.1; Platelets 280; Potassium 4.6; Sodium 137  Recent Lipid Panel    Component Value Date/Time   CHOL 204 (H) 10/12/2020 0930   TRIG 84 10/12/2020 0930   HDL 42 10/12/2020 0930   CHOLHDL 4.9  10/12/2020 0930   CHOLHDL 6.4 06/22/2020 1410   VLDL 19 06/22/2020 1410   LDLCALC 147 (H) 10/12/2020 0930    Physical Exam:    VS:  There were no vitals taken for this visit.    Wt Readings from Last 3 Encounters:  02/16/21 198 lb (89.8 kg)  01/18/21 197 lb (89.4 kg)  12/07/20 185 lb (83.9 kg)     GEN: Well nourished, well developed in no acute distress HEENT: Normal NECK: No JVD; No carotid bruits LYMPHATICS: crackles at lung bases, left greater than right  CARDIAC: RRR, no murmurs, rubs, gallops RESPIRATORY:  Clear to auscultation without rales, wheezing or rhonchi  ABDOMEN: Soft, non-tender, non-distended MUSCULOSKELETAL:  Trace lower extremity edema; No deformity  SKIN: Warm and dry NEUROLOGIC:  Alert and oriented x 3 PSYCHIATRIC:  Normal affect   ASSESSMENT:    No diagnosis found.   PLAN:     Chronic diastolic heart failure: Echocardiogram 06/22/2020 showed normal biventricular function, indeterminate diastolic function, moderate LVH, RVSP 25, RAP 3, no significant valvular disease. -Appears euvolemic on exam today.  Continue Lasix 80 mg daily, will check BMP  Hypertension: On amlodipine-benazepril 10-20 mg daily, hydralazine 100 mg 3 times daily, Imdur 30 mg daily, HCTZ 25 mg daily, Lasix 80 mg daily Toprol-XL 50 mg.  BP remains elevated.  Will check BMP, if stable creatinine plan to increase losartan to 100 mg daily.  Asked to check BP daily for next 2 weeks and call with results.  Will schedule in pharmacy hypertension clinic in 2 weeks for follow-up.  Hyperlipidemia: LDL 137 on 06/22/2020, started on atorvastatin 40 mg daily.  Will recheck lipid panel  CKD stage IV: Creatinine 3.72 on 11/18/2020.  Follows with nephrology  T2DM: A1c 8.1 on 10/10/2020.  On insulin   RTC in ***    Medication Adjustments/Labs and Tests Ordered: Current medicines are reviewed at length with the patient today.  Concerns regarding medicines are outlined above.  No orders of the  defined types were placed in this encounter.   No orders of the defined types were placed in this encounter.    There are no Patient Instructions on file for this visit.     Signed, Donato Heinz, MD  02/26/2021 5:59 AM    [place]

## 2021-03-16 ENCOUNTER — Ambulatory Visit (HOSPITAL_BASED_OUTPATIENT_CLINIC_OR_DEPARTMENT_OTHER): Payer: 59 | Admitting: Family Medicine

## 2021-03-19 ENCOUNTER — Ambulatory Visit (HOSPITAL_BASED_OUTPATIENT_CLINIC_OR_DEPARTMENT_OTHER): Payer: 59 | Admitting: Family Medicine

## 2021-03-31 ENCOUNTER — Other Ambulatory Visit: Payer: Self-pay

## 2021-03-31 DIAGNOSIS — N184 Chronic kidney disease, stage 4 (severe): Secondary | ICD-10-CM

## 2021-03-31 NOTE — Progress Notes (Signed)
Error

## 2021-04-06 ENCOUNTER — Other Ambulatory Visit (HOSPITAL_COMMUNITY): Payer: 59

## 2021-04-06 ENCOUNTER — Inpatient Hospital Stay (HOSPITAL_COMMUNITY): Admission: RE | Admit: 2021-04-06 | Payer: 59 | Source: Ambulatory Visit

## 2021-04-06 ENCOUNTER — Encounter: Payer: 59 | Admitting: Vascular Surgery

## 2021-04-08 NOTE — Progress Notes (Signed)
?Cardiology Office Note:   ? ?Date:  04/12/2021  ? ?ID:  Timothy [name], DOB [birth year], MRN [id number] ? ?PCP:  [name], [name], MD  ?Cardiologist:  Donato Heinz, MD  ?Electrophysiologist:  None  ? ?Referring MD: [name], [name], MD  ? ?Chief Complaint  ?Patient presents with  ? Hypertension  ?   ?  ? ? ? ?History of Present Illness:   ? ?Timothy [name] is a 51 y.o. male with a hx of chronic diastolic heart failure, hypertension, T2DM who presents as a follow-up for heart failure.  He was admitted from 5/19 through 06/24/2020 with pneumonia/acute on chronic diastolic heart failure.  Presented with fever to 101.6, SPO2 86% on room air, creatinine 2.71, WBC 12.2.  Chest x-ray showed multifocal pneumonia with concern for hypersensitivity pneumonitis versus pulmonary edema.  He was treated with IV antibiotics and Lasix.  COVID-19 was negative.  He was diuresed with IV Lasix 40 mg twice daily and discharged on p.o. Lasix 40 mg daily.  Creatinine 2.73 on discharge. ? ?Echocardiogram 06/19/2019 showed normal biventricular function, severe LVH, mild MR/AI.  Echocardiogram 06/22/2020 showed normal biventricular function, indeterminate diastolic function, moderate LVH, RVSP 25, RAP 3, no significant valvular disease. ? ?Since last clinic visit, he reports that he had sudden onset of shortness of breath this morning.  He denies any chest pain. Denies any headaches, lightheadedness, syncope, lower extremity edema, or palpitations.  Took his BP meds this morning. Not checking BP at home. Has appointment with nephrology later this month. ? ? ?Wt Readings from Last 3 Encounters:  ?04/12/21 188 lb 3.2 oz (85.4 kg)  ?02/16/21 198 lb (89.8 kg)  ?01/18/21 197 lb (89.4 kg)  ? ? ? ?Past Medical History:  ?Diagnosis Date  ? CHF (congestive heart failure) ([place])   ? Diabetes mellitus without complication ([place])   ? ? ?Past Surgical History:  ?Procedure Laterality Date  ? EYE SURGERY    ? ? ?Current  Medications: ?Current Meds  ?Medication Sig  ? amLODipine (NORVASC) 5 MG tablet Take 5 mg by mouth daily.  ? amLODipine-benazepril (LOTREL) 10-20 MG capsule Take 1 capsule by mouth daily.  ? atorvastatin (LIPITOR) 40 MG tablet Take 1 tablet (40 mg total) by mouth daily.  ? furosemide (LASIX) 80 MG tablet Take 80 mg by mouth daily.  ? glimepiride (AMARYL) 4 MG tablet Take 4 mg by mouth daily.  ? hydrALAZINE (APRESOLINE) 100 MG tablet Take 100 mg by mouth 3 (three) times daily.  ? hydrochlorothiazide (HYDRODIURIL) 25 MG tablet Take 25 mg by mouth daily.  ? hydrOXYzine (VISTARIL) 25 MG capsule Take 25 mg by mouth at bedtime.  ? insulin glargine (LANTUS SOLOSTAR) 100 UNIT/ML Solostar Pen Inject 5 Units into the skin daily. Inject as directed  ? insulin lispro (HUMALOG) 100 UNIT/ML KwikPen SMARTSIG:2-15 Unit(s) SUB-Q 3 Times Daily  ? Insulin Pen Needle (CAREFINE PEN NEEDLES) 32G X 4 MM MISC 1 applicator by Does not apply route See admin instructions.  ? isosorbide mononitrate (IMDUR) 30 MG 24 hr tablet Take 1 tablet (30 mg total) by mouth daily.  ? JANUVIA 100 MG tablet Take 100 mg by mouth daily.  ? levocetirizine (XYZAL) 5 MG tablet SMARTSIG:1 Tablet(s) By Mouth Every Evening  ? meloxicam (MOBIC) 15 MG tablet Take 15 mg by mouth daily.  ? metoCLOPramide (REGLAN) 5 MG tablet Take 1 tablet (5 mg total) by mouth 3 (three) times daily before meals.  ? metoprolol succinate (TOPROL-XL)  50 MG 24 hr tablet Take 50 mg by mouth daily.  ? omeprazole (PRILOSEC) 40 MG capsule Take 40 mg by mouth daily.  ?  ? ?Allergies:   Patient has no known allergies.  ? ?Social History  ? ?Socioeconomic History  ? Marital status: Single  ?  Spouse name: Not on file  ? Number of children: Not on file  ? Years of education: Not on file  ? Highest education level: Not on file  ?Occupational History  ? Not on file  ?Tobacco Use  ? Smoking status: Never  ? Smokeless tobacco: Never  ?Vaping Use  ? Vaping Use: Never used  ?Substance and Sexual Activity   ? Alcohol use: Not Currently  ? Drug use: Not Currently  ? Sexual activity: Not on file  ?Other Topics Concern  ? Not on file  ?Social History Narrative  ? Not on file  ? ?Social Determinants of Health  ? ?Financial Resource Strain: Not on file  ?Food Insecurity: Not on file  ?Transportation Needs: Not on file  ?Physical Activity: Not on file  ?Stress: Not on file  ?Social Connections: Not on file  ?  ? ?Family History: ?The patient's family history is not on file. ? ?ROS:   ?Please see the history of present illness.    ?All other systems reviewed and are negative. ? ?EKGs/Labs/Other Studies Reviewed:   ? ?The following studies were reviewed today: ? ? ?EKG:   ?04/12/21: Normal sinus rhythm, rate 80, LVH, QTc 470 ?07/05/2020- The EKG ordered today demonstrates Sinus rhythm, rate 79, LVH with repolarization changes, T wave inversions in leads 1 and 2, AVF ? ?Recent Labs: ?11/14/2020: ALT 20; B Natriuretic Peptide 243.3 ?11/17/2020: Magnesium 1.6 ?11/18/2020: BUN 27; Creatinine, Ser 3.72; Hemoglobin 9.1; Platelets 280; Potassium 4.6; Sodium 137  ?Recent Lipid Panel ?   ?Component Value Date/Time  ? CHOL 204 (H) 10/12/2020 0930  ? TRIG 84 10/12/2020 0930  ? HDL 42 10/12/2020 0930  ? CHOLHDL 4.9 10/12/2020 0930  ? CHOLHDL 6.4 06/22/2020 1410  ? VLDL 19 06/22/2020 1410  ? LDLCALC 147 (H) 10/12/2020 0930  ? ? ?Physical Exam:   ? ?VS:  BP (!) 215/112   Pulse 80   Ht 5\' 6"  (1.676 m)   Wt 188 lb 3.2 oz (85.4 kg)   SpO2 96%   BMI 30.38 kg/m?    ? ?Wt Readings from Last 3 Encounters:  ?04/12/21 188 lb 3.2 oz (85.4 kg)  ?02/16/21 198 lb (89.8 kg)  ?01/18/21 197 lb (89.4 kg)  ?  ? ?GEN: Well nourished, well developed in no acute distress ?HEENT: Normal ?NECK: No JVD; No carotid bruits ?LYMPHATICS: crackles at lung bases, left greater than right  ?CARDIAC: RRR, no murmurs, rubs, gallops ?RESPIRATORY:  Clear to auscultation without rales, wheezing or rhonchi  ?ABDOMEN: Soft, non-tender, non-distended ?MUSCULOSKELETAL:  Trace  lower extremity edema; No deformity  ?SKIN: Warm and dry ?NEUROLOGIC:  Alert and oriented x 3 ?PSYCHIATRIC:  Normal affect  ? ?ASSESSMENT:   ? ?1. Essential hypertension   ?2. Chronic diastolic heart failure ([place])   ?3. SOB (shortness of breath)   ?4. CKD (chronic kidney disease) stage 4, GFR 15-29 ml/min (HCC)   ? ? ? ?PLAN:   ? ?Hypertension: On amlodipine-benazepril 10-20 mg daily, HCTZ 25 mg daily, hydralazine 100 mg 3 times daily, Imdur 30 mg daily, Toprol-XL 50 mg daily, Lasix 80 mg.  BP markedly elevated in clinic today (220/124).  Patient reports sudden onset of shortness  of breath this morning.  SPO2 96% in clinic.  Recommend evaluation in ED, given symptoms concerning for hypertensive urgency versus emergency.  He is agreeable, declines EMS but his girlfriend will drive him to ED ? ?Chronic diastolic heart failure: Echocardiogram 06/22/2020 showed normal biventricular function, indeterminate diastolic function, moderate LVH, RVSP 25, RAP 3, no significant valvular disease.  On Lasix 80 mg ? ?Hyperlipidemia: LDL 137 on 06/22/2020, started on atorvastatin 40 mg daily.   ? ?CKD stage IV: Creatinine 3.97 11/17/2020.  Follows with nephrology ? ?T2DM: A1c 8.1 on 10/10/2020.  On insulin ? ? ?RTC in 1 month  ? ? ? ?Medication Adjustments/Labs and Tests Ordered: ?Current medicines are reviewed at length with the patient today.  Concerns regarding medicines are outlined above.  ?Orders Placed This Encounter  ?Procedures  ? EKG 12-Lead  ? ? ?No orders of the defined types were placed in this encounter. ? ? ? ?Patient Instructions  ?Proceed to ER for evaluation  ? ? ? ?Signed, ?Donato Heinz, MD  ?04/12/2021 8:42 PM    ?[place] ?

## 2021-04-12 ENCOUNTER — Encounter: Payer: Self-pay | Admitting: Cardiology

## 2021-04-12 ENCOUNTER — Other Ambulatory Visit: Payer: Self-pay

## 2021-04-12 ENCOUNTER — Ambulatory Visit (INDEPENDENT_AMBULATORY_CARE_PROVIDER_SITE_OTHER): Payer: 59 | Admitting: Cardiology

## 2021-04-12 VITALS — BP 215/112 | HR 80 | Ht 66.0 in | Wt 188.2 lb

## 2021-04-12 DIAGNOSIS — N184 Chronic kidney disease, stage 4 (severe): Secondary | ICD-10-CM | POA: Diagnosis not present

## 2021-04-12 DIAGNOSIS — I10 Essential (primary) hypertension: Secondary | ICD-10-CM

## 2021-04-12 DIAGNOSIS — I5032 Chronic diastolic (congestive) heart failure: Secondary | ICD-10-CM

## 2021-04-12 DIAGNOSIS — R0602 Shortness of breath: Secondary | ICD-10-CM

## 2021-04-12 NOTE — Patient Instructions (Signed)
Proceed to ER for evaluation ?

## 2021-04-13 ENCOUNTER — Encounter (HOSPITAL_BASED_OUTPATIENT_CLINIC_OR_DEPARTMENT_OTHER): Payer: Self-pay | Admitting: Family Medicine

## 2021-04-26 ENCOUNTER — Other Ambulatory Visit: Payer: Self-pay

## 2021-04-26 DIAGNOSIS — N184 Chronic kidney disease, stage 4 (severe): Secondary | ICD-10-CM

## 2021-04-26 NOTE — Progress Notes (Signed)
Error

## 2021-04-27 ENCOUNTER — Other Ambulatory Visit (HOSPITAL_COMMUNITY): Payer: 59

## 2021-04-27 ENCOUNTER — Encounter (HOSPITAL_COMMUNITY): Payer: 59

## 2021-05-02 ENCOUNTER — Encounter: Payer: 59 | Admitting: Vascular Surgery

## 2021-05-07 ENCOUNTER — Ambulatory Visit: Payer: 59 | Admitting: Cardiology

## 2021-05-07 NOTE — Progress Notes (Deleted)
**Note Timothy-Identified via Obfuscation** ?Cardiology Office Note:   ? ?Date:  05/07/2021  ? ?ID:  Timothy [name], DOB 1971-01-08, MRN [id number] ? ?PCP:  Timothy [place], [name], MD  ?Cardiologist:  Donato Heinz, MD  ?Electrophysiologist:  None  ? ?Referring MD: Timothy [place], [name], MD  ? ?No chief complaint on file. ? ? ? ?History of Present Illness:   ? ?Timothy [name] is a 51 y.o. male with a hx of chronic diastolic heart failure, hypertension, T2DM who presents as a follow-up for heart failure.  He was admitted from 5/19 through 06/24/2020 with pneumonia/acute on chronic diastolic heart failure.  Presented with fever to 101.6, SPO2 86% on room air, creatinine 2.71, WBC 12.2.  Chest x-ray showed multifocal pneumonia with concern for hypersensitivity pneumonitis versus pulmonary edema.  He was treated with IV antibiotics and Lasix.  COVID-19 was negative.  He was diuresed with IV Lasix 40 mg twice daily and discharged on p.o. Lasix 40 mg daily.  Creatinine 2.73 on discharge. ? ?Echocardiogram 06/19/2019 showed normal biventricular function, severe LVH, mild MR/AI.  Echocardiogram 06/22/2020 showed normal biventricular function, indeterminate diastolic function, moderate LVH, RVSP 25, RAP 3, no significant valvular disease. ? ?Since last clinic visit,  ?he reports that he had sudden onset of shortness of breath this morning.  He denies any chest pain. Denies any headaches, lightheadedness, syncope, lower extremity edema, or palpitations.  Took his BP meds this morning. Not checking BP at home. Has appointment with nephrology later this month. ? ? ?Wt Readings from Last 3 Encounters:  ?04/12/21 188 lb 3.2 oz (85.4 kg)  ?02/16/21 198 lb (89.8 kg)  ?01/18/21 197 lb (89.4 kg)  ? ? ? ?Past Medical History:  ?Diagnosis Date  ? CHF (congestive heart failure) ([place])   ? Diabetes mellitus without complication ([place])   ? ? ?Past Surgical History:  ?Procedure Laterality Date  ? EYE SURGERY    ? ? ?Current Medications: ?No outpatient medications have been  marked as taking for the 05/07/21 encounter (Appointment) with Donato Heinz, MD.  ?  ? ?Allergies:   Patient has no known allergies.  ? ?Social History  ? ?Socioeconomic History  ? Marital status: Single  ?  Spouse name: Not on file  ? Number of children: Not on file  ? Years of education: Not on file  ? Highest education level: Not on file  ?Occupational History  ? Not on file  ?Tobacco Use  ? Smoking status: Never  ? Smokeless tobacco: Never  ?Vaping Use  ? Vaping Use: Never used  ?Substance and Sexual Activity  ? Alcohol use: Not Currently  ? Drug use: Not Currently  ? Sexual activity: Not on file  ?Other Topics Concern  ? Not on file  ?Social History Narrative  ? Not on file  ? ?Social Determinants of Health  ? ?Financial Resource Strain: Not on file  ?Food Insecurity: Not on file  ?Transportation Needs: Not on file  ?Physical Activity: Not on file  ?Stress: Not on file  ?Social Connections: Not on file  ?  ? ?Family History: ?The patient's family history is not on file. ? ?ROS:   ?Please see the history of present illness.    ?All other systems reviewed and are negative. ? ?EKGs/Labs/Other Studies Reviewed:   ? ?The following studies were reviewed today: ? ? ?EKG:   ?04/12/21: Normal sinus rhythm, rate 80, LVH, QTc 470 ?07/05/2020- The EKG ordered today demonstrates Sinus rhythm, rate 79, LVH with repolarization  changes, T wave inversions in leads 1 and 2, AVF ? ?Recent Labs: ?11/14/2020: ALT 20; B Natriuretic Peptide 243.3 ?11/17/2020: Magnesium 1.6 ?11/18/2020: BUN 27; Creatinine, Ser 3.72; Hemoglobin 9.1; Platelets 280; Potassium 4.6; Sodium 137  ?Recent Lipid Panel ?   ?Component Value Date/Time  ? CHOL 204 (H) 10/12/2020 0930  ? TRIG 84 10/12/2020 0930  ? HDL 42 10/12/2020 0930  ? CHOLHDL 4.9 10/12/2020 0930  ? CHOLHDL 6.4 06/22/2020 1410  ? VLDL 19 06/22/2020 1410  ? LDLCALC 147 (H) 10/12/2020 0930  ? ? ?Physical Exam:   ? ?VS:  There were no vitals taken for this visit.   ? ?Wt Readings from Last 3  Encounters:  ?04/12/21 188 lb 3.2 oz (85.4 kg)  ?02/16/21 198 lb (89.8 kg)  ?01/18/21 197 lb (89.4 kg)  ?  ? ?GEN: Well nourished, well developed in no acute distress ?HEENT: Normal ?NECK: No JVD; No carotid bruits ?LYMPHATICS: crackles at lung bases, left greater than right  ?CARDIAC: RRR, no murmurs, rubs, gallops ?RESPIRATORY:  Clear to auscultation without rales, wheezing or rhonchi  ?ABDOMEN: Soft, non-tender, non-distended ?MUSCULOSKELETAL:  Trace lower extremity edema; No deformity  ?SKIN: Warm and dry ?NEUROLOGIC:  Alert and oriented x 3 ?PSYCHIATRIC:  Normal affect  ? ?ASSESSMENT:   ? ?No diagnosis found. ? ? ? ?PLAN:   ? ?Hypertension: On amlodipine-benazepril 10-20 mg daily, HCTZ 25 mg daily, hydralazine 100 mg 3 times daily, Imdur 30 mg daily, Toprol-XL 50 mg daily, Lasix 80 mg.  BP markedly elevated in clinic today (220/124).  Patient reports sudden onset of shortness of breath this morning.  SPO2 96% in clinic.  Recommend evaluation in ED, given symptoms concerning for hypertensive urgency versus emergency.  He is agreeable, declines EMS but his girlfriend will drive him to ED ? ?Chronic diastolic heart failure: Echocardiogram 06/22/2020 showed normal biventricular function, indeterminate diastolic function, moderate LVH, RVSP 25, RAP 3, no significant valvular disease.  On Lasix 80 mg ? ?Hyperlipidemia: LDL 137 on 06/22/2020, started on atorvastatin 40 mg daily.   ? ?CKD stage IV: Creatinine 3.97 11/17/2020.  Follows with nephrology ? ?T2DM: A1c 8.1 on 10/10/2020.  On insulin ? ? ?RTC in *** ? ? ? ?Medication Adjustments/Labs and Tests Ordered: ?Current medicines are reviewed at length with the patient today.  Concerns regarding medicines are outlined above.  ?No orders of the defined types were placed in this encounter. ? ? ?No orders of the defined types were placed in this encounter. ? ? ? ?There are no Patient Instructions on file for this visit.  ? ? ? ?Signed, ?Donato Heinz, MD  ?05/07/2021  12:27 PM    ?[place] ?

## 2021-05-14 ENCOUNTER — Encounter: Payer: Self-pay | Admitting: Cardiology

## 2021-07-08 ENCOUNTER — Emergency Department (HOSPITAL_COMMUNITY): Payer: 59

## 2021-07-08 ENCOUNTER — Other Ambulatory Visit: Payer: Self-pay

## 2021-07-08 ENCOUNTER — Encounter (HOSPITAL_COMMUNITY): Payer: Self-pay | Admitting: Emergency Medicine

## 2021-07-08 ENCOUNTER — Emergency Department (HOSPITAL_COMMUNITY)
Admission: EM | Admit: 2021-07-08 | Discharge: 2021-07-08 | Disposition: A | Discharge status: Home / Self Care | Payer: 59 | Attending: Emergency Medicine | Admitting: Emergency Medicine

## 2021-07-08 DIAGNOSIS — E1122 Type 2 diabetes mellitus with diabetic chronic kidney disease: Secondary | ICD-10-CM | POA: Diagnosis not present

## 2021-07-08 DIAGNOSIS — Z794 Long term (current) use of insulin: Secondary | ICD-10-CM | POA: Insufficient documentation

## 2021-07-08 DIAGNOSIS — R0602 Shortness of breath: Secondary | ICD-10-CM | POA: Diagnosis not present

## 2021-07-08 DIAGNOSIS — R079 Chest pain, unspecified: Secondary | ICD-10-CM | POA: Diagnosis not present

## 2021-07-08 DIAGNOSIS — N185 Chronic kidney disease, stage 5: Secondary | ICD-10-CM | POA: Insufficient documentation

## 2021-07-08 DIAGNOSIS — I509 Heart failure, unspecified: Secondary | ICD-10-CM | POA: Insufficient documentation

## 2021-07-08 DIAGNOSIS — L03011 Cellulitis of right finger: Secondary | ICD-10-CM | POA: Insufficient documentation

## 2021-07-08 DIAGNOSIS — Z7982 Long term (current) use of aspirin: Secondary | ICD-10-CM | POA: Diagnosis not present

## 2021-07-08 DIAGNOSIS — N179 Acute kidney failure, unspecified: Secondary | ICD-10-CM

## 2021-07-08 DIAGNOSIS — I132 Hypertensive heart and chronic kidney disease with heart failure and with stage 5 chronic kidney disease, or end stage renal disease: Secondary | ICD-10-CM | POA: Diagnosis not present

## 2021-07-08 DIAGNOSIS — M79644 Pain in right finger(s): Secondary | ICD-10-CM | POA: Diagnosis present

## 2021-07-08 DIAGNOSIS — R778 Other specified abnormalities of plasma proteins: Secondary | ICD-10-CM

## 2021-07-08 DIAGNOSIS — Z79899 Other long term (current) drug therapy: Secondary | ICD-10-CM | POA: Diagnosis not present

## 2021-07-08 HISTORY — DX: Essential (primary) hypertension: I10

## 2021-07-08 LAB — BASIC METABOLIC PANEL
Anion gap: 11 (ref 5–15)
BUN: 35 mg/dL — ABNORMAL HIGH (ref 6–20)
CO2: 17 mmol/L — ABNORMAL LOW (ref 22–32)
Calcium: 8.2 mg/dL — ABNORMAL LOW (ref 8.9–10.3)
Chloride: 106 mmol/L (ref 98–111)
Creatinine, Ser: 5.39 mg/dL — ABNORMAL HIGH (ref 0.61–1.24)
GFR, Estimated: 12 mL/min — ABNORMAL LOW (ref >60)
Glucose, Bld: 155 mg/dL — ABNORMAL HIGH (ref 70–99)
Potassium: 4.4 mmol/L (ref 3.5–5.1)
Sodium: 134 mmol/L — ABNORMAL LOW (ref 135–145)

## 2021-07-08 LAB — TROPONIN I (HIGH SENSITIVITY)
Troponin I (High Sensitivity): 62 ng/L — ABNORMAL HIGH (ref <18)
Troponin I (High Sensitivity): 81 ng/L — ABNORMAL HIGH (ref <18)

## 2021-07-08 LAB — CBC
HCT: 33.1 % — ABNORMAL LOW (ref 39.0–52.0)
Hemoglobin: 10.8 g/dL — ABNORMAL LOW (ref 13.0–17.0)
MCH: 25.6 pg — ABNORMAL LOW (ref 26.0–34.0)
MCHC: 32.6 g/dL (ref 30.0–36.0)
MCV: 78.4 fL — ABNORMAL LOW (ref 80.0–100.0)
Platelets: 281 10*3/uL (ref 150–400)
RBC: 4.22 MIL/uL (ref 4.22–5.81)
RDW: 14.6 % (ref 11.5–15.5)
WBC: 10.1 10*3/uL (ref 4.0–10.5)
nRBC: 0 % (ref 0.0–0.2)

## 2021-07-08 MED ORDER — DOXYCYCLINE HYCLATE 100 MG PO TABS
100.0000 mg | ORAL_TABLET | Freq: Two times a day (BID) | ORAL | Status: DC
Start: 2021-07-08 — End: 2021-07-08
  Administered 2021-07-08: 100 mg via ORAL
  Filled 2021-07-08: qty 1

## 2021-07-08 MED ORDER — DOXYCYCLINE HYCLATE 50 MG PO CAPS: 100.0000 mg | ORAL_CAPSULE | Freq: Two times a day (BID) | ORAL | 0 refills | Status: DC

## 2021-07-08 MED ORDER — ASPIRIN 81 MG PO TBEC: 81.0000 mg | DELAYED_RELEASE_TABLET | Freq: Every day | ORAL | Status: DC

## 2021-07-08 MED ORDER — ASPIRIN 325 MG PO TABS
325.0000 mg | ORAL_TABLET | Freq: Every day | ORAL | Status: DC
  Administered 2021-07-08: 325 mg via ORAL
  Filled 2021-07-08: qty 1

## 2021-07-08 MED ORDER — LIDOCAINE HCL (PF) 1 % IJ SOLN
5.0000 mL | Freq: Once | INTRAMUSCULAR | Status: AC
  Administered 2021-07-08: 5 mL
  Filled 2021-07-08: qty 5

## 2021-07-08 MED ORDER — ASPIRIN 81 MG PO TBEC: 81.0000 mg | DELAYED_RELEASE_TABLET | Freq: Every day | ORAL | 3 refills | Status: AC

## 2021-07-08 NOTE — ED Notes (Signed)
No signature pad available for Pt to sign for discharge.

## 2021-07-08 NOTE — ED Notes (Signed)
ED Provider at bedside. 

## 2021-07-08 NOTE — ED Provider Triage Note (Signed)
Emergency Medicine Provider Triage Evaluation Note  Timothy [name] , a 51 y.o. male  was evaluated in triage.  Pt complains of Right middle finger pain.  This started 4 days ago.  Unknown fevers.   His chest started hurting when he got here. He took his BP meds on the way here. His finger hurts more than his chest.  He has pain in the left side of his neck.      Physical Exam  There were no vitals taken for this visit. Gen:   Awake, appears uncomfortable Resp:  Normal effort, breath sounds present bilaterally MSK:   Moves extremities without difficulty, right middle finger is diffusely swollen with what appears to be abscess removing the nail bed. Other:  Radial pulses 2+ bilaterally.  Patient is not diaphoretic.  No murmur.    Medical Decision Making  Medically screening exam initiated at 12:23 PM.  Appropriate orders placed.  Timothy [name] was informed that the remainder of the evaluation will be completed by another provider, this initial triage assessment does not replace that evaluation, and the importance of remaining in the ED until their evaluation is complete.     Lorin Glass, [place] 07/08/21 1232

## 2021-07-08 NOTE — ED Notes (Signed)
Patient transported to X-ray 

## 2021-07-08 NOTE — ED Provider Notes (Signed)
  Physical Exam  BP (!) 171/100   Pulse 96   Temp 99.7 F (37.6 C) (Oral)   Resp 20   SpO2 99%   Physical Exam  Procedures  Procedures  ED Course / MDM   Clinical Course as of 07/08/21 1724  Sun Jul 08, 2021  1447 Consulted nephology Dr. Jonnie Finner regarding significant decrease in renal function from last labs available. They agreed to see the patient [CR]    Clinical Course User Index [CR] Wilnette Kales, PA   Medical Decision Making Amount and/or Complexity of Data Reviewed Labs: ordered. Radiology: ordered.  Risk OTC drugs. Prescription drug management. Decision regarding hospitalization.    51 year old male with a history of HTN, DM, and CKD presenting to the ED with concerns for paronychia.  On arrival, patient also started endorsing chest pain with shortness of breath.  His initial troponin was elevated to 62, but ECG appears largely unchanged from prior with nonspecific T wave changes.  Plan to repeat troponin.  His creatinine was also noted to be elevated to 5.39.  His most recent creatinine in our charts is in the threes, but this is from 7 months ago.  He does state that he is followed by [facility] and is reportedly getting worked up for dialysis currently.  However, we are unable to visualize the labs from [facility] so nephrology has been consulted.  We will follow up their recommendations.  Patient's repeat troponin up trended to 81.  On my evaluation, patient states that he is now chest pain-free.  He does endorse the chest pain as a pressure across his chest with some associated shortness of breath.  He was given 325 mg of aspirin while in the ED.  I spoke with nephrology and they state that he can continue his outpatient work-up given that he does not appear fluid overloaded and does not have any acute indications for dialysis.  However, given patient's uptrending troponin, I do feel he requires admission for observation.  It appears his last echo was  just over a year ago.  In our charts, does not appear he has had a heart catheterization.  Patient is amenable to admission.  Hospitalist team spoke with the patient and have decided that pt is appropriate for outpatient follow-up which they have arranged. Please see their discharge summary for further information.      Sondra Come, MD 07/08/21 [place]let    Noemi Chapel, MD 07/09/21 [phone]

## 2021-07-08 NOTE — ED Notes (Signed)
Brought from xray to ED bed  states pain is 10/10 rt long finger.  States pain started 4 days ago.   Finger appears to be infected.  Pus around nail; and finger is swollen.  Assessment by Nicholas Lose

## 2021-07-08 NOTE — Discharge Summary (Signed)
Physician Discharge Summary   Patient: Timothy [name] MRN: [id number] DOB: 12-25-1970  Admit date:     07/08/2021  Discharge date: 07/08/21  Discharge Physician: Lequita Halt   PCP: [name], [name], MD   Recommendations at discharge:   Follow-up with cardiologist Dr. Nechama Guard in 1 week to discuss about stress test.  Start aspirin 81 mg daily.  If experience more episodes of chest pain, come back to ED for further evaluation. Take doxycycline 100 mg twice daily x7 days, if develop further swelling increasing pain of the right middle finger, come back to ED.  Discharge Diagnoses: Active Problems:   AKI (acute kidney injury) ([place])  Resolved Problems: Chest pain resolved.  Hospital Course:  51 year old male patient with past medical history of CKD stage IV, refractory hypertension, IDDM came with worsening of right middle finger swelling and pain and 1 episode of chest pain this morning.  Patient has had swelling and pain of right middle finger for several days with pus coming out this morning.  He did not seek any medical advice, but this morning, he woke up with excruciating pain of the right finger, despite taking some OTC, symptoms continue to get worse, meantime, he feels the pain triggered severe panic attack, he started to feel pressure-like chest pain, localized, nonradiating, not associated with shortness of breath or palpitations.  He denied any exacerbation or relieving factor, but he did sit down for about 50 minutes and the chest pain subsided.  He has history of refractory hypertension follows with cardiology was recently seen 3 months ago with Dr. Nechama Guard.  In the ED, work-up showed patient has a small abscess of the right middle fingertip, x-ray negative for bony involvement.  Patient underwent incision and drainage and packing.  Work-up showed patient has a AKI with trending up creatinine 3.7-5.39 today and worsening of bicarb of 17 today.  Chest x-ray no acute  infiltrates.  Troponin elevated 62> 81, EKG showed sinus rhythm, LVH and chronic nonspecific ST-T changes/early repolarization.  Patient was eval by nephrology, given there is no significant indication for emergency dialysis, nephrology recommended patient follow-up in office tomorrow to discuss starting dialysis.  Further chest pain and elevation of troponins, given patient has a concurrent AKI, and the trending of troponin is flat, ACS unlikely.  And there is also possible that the chest pain today triggered by panic attack from uncontrolled finger pain which has resolved after incision and drainage.  And patient wished to talk to Dr. Nechama Guard again next week for outpatient stress test.  Outpatient echo also ordered.  Assessment and Plan: No notes have been filed under this hospital service. Service: Hospitalist       Consultants: Nephrology Procedures performed: Right middle finger incision and drainage Disposition: Discharged home with stable conditions Diet recommendation:  Nephro diet with low potassium, fluid restriction 1800 mL/day DISCHARGE MEDICATION:   Discharge Exam: There were no vitals filed for this visit.   Condition at discharge: Stable  The results of significant diagnostics from this hospitalization (including imaging, microbiology, ancillary and laboratory) are listed below for reference.   Imaging Studies: DG Chest 2 View  Result Date: 07/08/2021 CLINICAL DATA:  Chest pain radiating to neck.  Shortness of breath. EXAM: CHEST - 2 VIEW COMPARISON:  11/14/2020 FINDINGS: The heart size and mediastinal contours are within normal limits. Both lungs are clear. The visualized skeletal structures are unremarkable. IMPRESSION: No active cardiopulmonary disease. Electronically Signed   By: Myles Rosenthal.D.  On: 07/08/2021 13:44   DG Finger Middle Right  Result Date: 07/08/2021 CLINICAL DATA:  Right middle finger soft tissue infection and pain. EXAM: RIGHT MIDDLE FINGER  2+V COMPARISON:  None Available. FINDINGS: Soft-tissue swelling seen overlying the distal phalanx. No evidence of radiopaque foreign body or soft tissue gas. There is no evidence of fracture or dislocation. There is no evidence of osteomyelitis. Mild degenerative spurring is seen involving the DIP joint. IMPRESSION: Distal soft tissue swelling. No radiographic evidence of osteomyelitis or fracture. Mild DIP joint osteoarthritis. Electronically Signed   By: Marlaine Hind M.D.   On: 07/08/2021 13:43    Microbiology: Results for orders placed or performed during the hospital encounter of 11/14/20  Resp Panel by RT-PCR (Flu A&B, Covid) Nasopharyngeal Swab     Status: None   Collection Time: 11/14/20 11:42 PM   Specimen: Nasopharyngeal Swab; Nasopharyngeal(NP) swabs in vial transport medium  Result Value Ref Range Status   SARS Coronavirus 2 by RT PCR NEGATIVE NEGATIVE Final    Comment: (NOTE) SARS-CoV-2 target nucleic acids are NOT DETECTED.  The SARS-CoV-2 RNA is generally detectable in upper respiratory specimens during the acute phase of infection. The lowest concentration of SARS-CoV-2 viral copies this assay can detect is 138 copies/mL. A negative result does not preclude SARS-Cov-2 infection and should not be used as the sole basis for treatment or other patient management decisions. A negative result may occur with  improper specimen collection/handling, submission of specimen other than nasopharyngeal swab, presence of viral mutation(s) within the areas targeted by this assay, and inadequate number of viral copies(<138 copies/mL). A negative result must be combined with clinical observations, patient history, and epidemiological information. The expected result is Negative.  Fact Sheet for Patients:  EntrepreneurPulse.com.au  Fact Sheet for Healthcare Providers:  IncredibleEmployment.be  This test is no t yet approved or cleared by the [place] FDA and  has been authorized for detection and/or diagnosis of SARS-CoV-2 by FDA under an Emergency Use Authorization (EUA). This EUA will remain  in effect (meaning this test can be used) for the duration of the COVID-19 declaration under Section 564(b)(1) of the Act, 21 U.S.C.section 360bbb-3(b)(1), unless the authorization is terminated  or revoked sooner.       Influenza A by PCR NEGATIVE NEGATIVE Final   Influenza B by PCR NEGATIVE NEGATIVE Final    Comment: (NOTE) The Xpert Xpress SARS-CoV-2/FLU/RSV plus assay is intended as an aid in the diagnosis of influenza from Nasopharyngeal swab specimens and should not be used as a sole basis for treatment. Nasal washings and aspirates are unacceptable for Xpert Xpress SARS-CoV-2/FLU/RSV testing.  Fact Sheet for Patients: EntrepreneurPulse.com.au  Fact Sheet for Healthcare Providers: IncredibleEmployment.be  This test is not yet approved or cleared by the [place] FDA and has been authorized for detection and/or diagnosis of SARS-CoV-2 by FDA under an Emergency Use Authorization (EUA). This EUA will remain in effect (meaning this test can be used) for the duration of the COVID-19 declaration under Section 564(b)(1) of the Act, 21 U.S.C. section 360bbb-3(b)(1), unless the authorization is terminated or revoked.  Performed at [facility], [place] [address], [place], [place] [address]     Labs: CBC: Recent Labs  Lab 07/08/21 1214  WBC 10.1  HGB 10.8*  HCT 33.1*  MCV 78.4*  PLT 503   Basic Metabolic Panel: Recent Labs  Lab 07/08/21 1214  NA 134*  K 4.4  CL 106  CO2 17*  GLUCOSE 155*  BUN  35*  CREATININE 5.39*  CALCIUM 8.2*   Liver Function Tests: No results for input(s): AST, ALT, ALKPHOS, BILITOT, PROT, ALBUMIN in the last 168 hours. CBG: No results for input(s): GLUCAP in the last 168 hours.  Discharge time spent: greater than 30  minutes.  Signed: Lequita Halt, MD [facility] 07/08/2021

## 2021-07-08 NOTE — ED Notes (Signed)
Pt verbalized understanding of d/c instructions and follow-up.  Pt is aware to follow-up w/ cardiology and nephrology.  Pt reports he already is established w/ both.

## 2021-07-08 NOTE — ED Triage Notes (Signed)
Pt reports infection to R middle finger x 4 days.  States he went to [facility] this morning and BP was 201/105.  Reports chest pain, SOB, and neck pain that started while sitting in triage room.  States he took BP medication on the way to hospital.

## 2021-07-08 NOTE — ED Provider Notes (Signed)
[facility] EMERGENCY DEPARTMENT Provider Note   CSN: [id number] Arrival date & time: 07/08/21  1056     History  Chief Complaint  Patient presents with   finger infection   Chest Pain    Timothy [name] is a 51 y.o. male.   Chest Pain Associated symptoms: no abdominal pain, no back pain, no cough, no fever, no nausea, no palpitations, no shortness of breath and no vomiting    51 year old male presents emergency department with complaints of right middle finger pain.  Patient states that this began 3 days ago when he went to remove part of his nail.  It has progressively gotten more swollen more painful since then.  He is also complaining of shortness of breath and chest pain which both began earlier today prompting his visit to the emergency department.  Denies fever, chills, night sweats, abdominal pain, nausea/vomiting/diarrhea, urinary symptoms, change in bowel habits.  Past Medical History:  Diagnosis Date   CHF (congestive heart failure) (H[facility])    Diabetes mellitus without complication ([place])    Hypertension    Past Surgical History:  Procedure Laterality Date   EYE SURGERY       Home Medications Prior to Admission medications   Medication Sig Start Date End Date Taking? Authorizing Provider  aspirin EC 81 MG tablet Take 1 tablet (81 mg total) by mouth daily. Swallow whole. 07/08/21 07/08/22 Yes Lequita Halt, MD  doxycycline (VIBRAMYCIN) 50 MG capsule Take 2 capsules (100 mg total) by mouth 2 (two) times daily. 07/08/21  Yes Wynetta Fines T, MD  amLODipine (NORVASC) 5 MG tablet Take 5 mg by mouth daily. 10/21/20   [provider]  amLODipine-benazepril (LOTREL) 10-20 MG capsule Take 1 capsule by mouth daily. 01/15/21   [provider]  atorvastatin (LIPITOR) 40 MG tablet Take 1 tablet (40 mg total) by mouth daily. 07/26/20 11/15/21  Donato Heinz, MD  furosemide (LASIX) 80 MG tablet Take 80 mg by mouth daily.    [provider]  glimepiride (AMARYL) 4 MG tablet Take 4 mg by mouth daily. 01/12/21   [provider]  hydrALAZINE (APRESOLINE) 100 MG tablet Take 100 mg by mouth 3 (three) times daily. 11/09/20   [provider]  hydrOXYzine (VISTARIL) 25 MG capsule Take 25 mg by mouth at bedtime. 11/01/20   [provider]  insulin glargine (LANTUS SOLOSTAR) 100 UNIT/ML Solostar Pen Inject 5 Units into the skin daily. Inject as directed 01/19/21   [name], [name], MD  insulin lispro (HUMALOG) 100 UNIT/ML KwikPen SMARTSIG:2-15 Unit(s) SUB-Q 3 Times Daily 01/12/21   [provider]  isosorbide mononitrate (IMDUR) 30 MG 24 hr tablet Take 1 tablet (30 mg total) by mouth daily. 07/26/20 12/06/21  Donato Heinz, MD  JANUVIA 100 MG tablet Take 100 mg by mouth daily. 01/12/21   [provider]  levocetirizine (XYZAL) 5 MG tablet SMARTSIG:1 Tablet(s) By Mouth Every Evening 10/24/20   [provider]  meloxicam (MOBIC) 15 MG tablet Take 15 mg by mouth daily. 01/12/21   [provider]  metoCLOPramide (REGLAN) 5 MG tablet Take 1 tablet (5 mg total) by mouth 3 (three) times daily before meals. 11/18/20 04/12/21  Bonnielee Haff, MD  metoprolol succinate (TOPROL-XL) 50 MG 24 hr tablet Take 50 mg by mouth daily. 11/02/20   [provider]  omeprazole (PRILOSEC) 40 MG capsule Take 40 mg by mouth daily. 01/12/21   [provider]  Allergies    Patient has no known allergies.    Review of Systems   Review of Systems  Constitutional:  Negative for chills and fever.  HENT:  Negative for ear pain and sore throat.   Eyes:  Negative for pain and visual disturbance.  Respiratory:  Negative for cough and shortness of breath.   Cardiovascular:  Positive for chest pain and leg swelling. Negative for palpitations.  Gastrointestinal:  Negative for abdominal pain, diarrhea, nausea and vomiting.  Genitourinary:  Negative for dysuria and hematuria.   Musculoskeletal:  Negative for arthralgias and back pain.       R middle finger pain,swelling,redness  Skin:  Negative for color change and rash.  Neurological:  Negative for seizures and syncope.  All other systems reviewed and are negative.  Physical Exam Updated Vital Signs BP (!) 178/104 (BP Location: Left Arm)   Pulse 93   Temp 99.7 F (37.6 C) (Oral)   Resp 15   SpO2 97%  Physical Exam Vitals and nursing note reviewed.  Constitutional:      General: He is not in acute distress.    Appearance: He is well-developed and normal weight. He is not ill-appearing.  HENT:     Head: Normocephalic and atraumatic.  Eyes:     Extraocular Movements: Extraocular movements intact.     Conjunctiva/sclera: Conjunctivae normal.  Neck:     Vascular: No JVD.  Cardiovascular:     Rate and Rhythm: Normal rate and regular rhythm.     Heart sounds: Normal heart sounds. No murmur heard. Pulmonary:     Effort: Pulmonary effort is normal. No respiratory distress.     Breath sounds: Normal breath sounds. No decreased breath sounds, wheezing, rhonchi or rales.  Chest:     Chest wall: No mass or tenderness.  Abdominal:     General: Bowel sounds are normal.     Palpations: Abdomen is soft.     Tenderness: There is no abdominal tenderness.  Musculoskeletal:        General: No swelling.     Cervical back: Normal range of motion and neck supple.     Comments: Swelling of entire R third digit of upper extremity with overlying erythema. Area of fluctuance noted on dorsal aspect of distal phalanx with obvious discoloration. Patient able to flex and extend digit. Pain elicited with flexion. No pain with passive flexion.   1-2+ lower extremity pitting edema.   Radial pulses full and intact bilaterally. Muscle strength 5/5. No loss of sensation distal to injury. Patient able to make "ok" sign, fist, resist horizontal adduction of fingers, thumbs up, extend wrist.   Skin:    General: Skin is warm and  dry.     Capillary Refill: Capillary refill takes less than 2 seconds.  Neurological:     General: No focal deficit present.     Mental Status: He is alert and oriented to person, place, and time.  Psychiatric:        Mood and Affect: Mood normal.    ED Results / Procedures / Treatments   Labs (all labs ordered are listed, but only abnormal results are displayed) Labs Reviewed  BASIC METABOLIC PANEL - Abnormal; Notable for the following components:      Result Value   Sodium 134 (*)    CO2 17 (*)    Glucose, Bld 155 (*)    BUN 35 (*)    Creatinine, Ser 5.39 (*)    Calcium 8.2 (*)  GFR, Estimated 12 (*)    All other components within normal limits  CBC - Abnormal; Notable for the following components:   Hemoglobin 10.8 (*)    HCT 33.1 (*)    MCV 78.4 (*)    MCH 25.6 (*)    All other components within normal limits  TROPONIN I (HIGH SENSITIVITY) - Abnormal; Notable for the following components:   Troponin I (High Sensitivity) 62 (*)    All other components within normal limits  TROPONIN I (HIGH SENSITIVITY) - Abnormal; Notable for the following components:   Troponin I (High Sensitivity) 81 (*)    All other components within normal limits    EKG EKG Interpretation  Date/Time:  Sunday July 08 2021 12:07:17 EDT Ventricular Rate:  87 PR Interval:  166 QRS Duration: 82 QT Interval:  380 QTC Calculation: 457 R Axis:   89 Text Interpretation: Normal sinus rhythm Possible Left atrial enlargement Minimal voltage criteria for LVH, may be normal variant ( Cornell product ) T wave abnormality, consider inferior ischemia Abnormal ECG When compared with ECG of 14-Nov-2020 21:18, PREVIOUS ECG IS PRESENT Confirmed by Dene Gentry [phone]) on 07/08/2021 2:55:11 PM  Radiology DG Chest 2 View  Result Date: 07/08/2021 CLINICAL DATA:  Chest pain radiating to neck.  Shortness of breath. EXAM: CHEST - 2 VIEW COMPARISON:  11/14/2020 FINDINGS: The heart size and mediastinal contours are  within normal limits. Both lungs are clear. The visualized skeletal structures are unremarkable. IMPRESSION: No active cardiopulmonary disease. Electronically Signed   By: Marlaine Hind M.D.   On: 07/08/2021 13:44   DG Finger Middle Right  Result Date: 07/08/2021 CLINICAL DATA:  Right middle finger soft tissue infection and pain. EXAM: RIGHT MIDDLE FINGER 2+V COMPARISON:  None Available. FINDINGS: Soft-tissue swelling seen overlying the distal phalanx. No evidence of radiopaque foreign body or soft tissue gas. There is no evidence of fracture or dislocation. There is no evidence of osteomyelitis. Mild degenerative spurring is seen involving the DIP joint. IMPRESSION: Distal soft tissue swelling. No radiographic evidence of osteomyelitis or fracture. Mild DIP joint osteoarthritis. Electronically Signed   By: Marlaine Hind M.D.   On: 07/08/2021 13:43    Procedures .Marland KitchenIncision and Drainage  Date/Time: 07/08/2021 4:36 PM Performed by: Wilnette Kales, PA Authorized by: Wilnette Kales, PA   Consent:    Consent obtained:  Verbal   Consent given by:  Patient   Risks, benefits, and alternatives were discussed: yes     Risks discussed:  Bleeding, incomplete drainage, pain, infection and damage to other organs   Alternatives discussed:  No treatment, delayed treatment, alternative treatment, observation and referral Universal protocol:    Procedure explained and questions answered to patient or proxy's satisfaction: yes     Patient identity confirmed:  Verbally with patient Location:    Type:  Abscess   Size:  Dorsal aspect of distal phalanx.   Location:  Upper extremity   Upper extremity location:  Finger   Finger location:  R long finger Pre-procedure details:    Skin preparation:  Chlorhexidine with alcohol Sedation:    Sedation type:  None Anesthesia:    Anesthesia method:  None (Patient refused) Procedure type:    Complexity:  Simple Procedure details:    Ultrasound guidance: no      Needle aspiration: no     Incision types:  Single straight   Wound management:  Irrigated with saline   Drainage:  Bloody and purulent   Drainage amount:  Copious  Wound treatment:  Wound left open Post-procedure details:    Procedure completion:  Tolerated well, no immediate complications    Medications Ordered in ED Medications  doxycycline (VIBRA-TABS) tablet 100 mg (100 mg Oral Given 07/08/21 1649)  aspirin EC tablet 81 mg (has no administration in time range)  lidocaine (PF) (XYLOCAINE) 1 % injection 5 mL (5 mLs Other Given by Other 07/08/21 1532)    ED Course/ Medical Decision Making/ A&P Clinical Course as of 07/08/21 2033  Sun Jul 08, 2021  1447 Consulted nephology Dr. Jonnie Finner regarding significant decrease in renal function from last labs available. They agreed to see the patient [CR]    Clinical Course User Index [CR] Wilnette Kales, [place]   HEAR Score: 4                       Medical Decision Making Amount and/or Complexity of Data Reviewed Labs: ordered. Radiology: ordered.  Risk OTC drugs. Prescription drug management.   This patient presents to the ED for concern of Chest pain/SOB and abscess on finger, this involves an extensive number of treatment options, and is a complaint that carries with it a high risk of complications and morbidity.  The differential diagnosis includes The emergent causes of chest pain include: Acute coronary syndrome, tamponade, pericarditis/myocarditis, aortic dissection, pulmonary embolism, tension pneumothorax, pneumonia, and esophageal rupture, CHF  I do not believe the patient has an emergent cause of chest pain, other urgent/non-acute considerations include, but are not limited to: chronic angina, aortic stenosis, cardiomyopathy, mitral valve prolapse, pulmonary hypertension, aortic insufficiency, right ventricular hypertrophy, pleuritis, bronchitis, pneumothorax, tumor, gastroesophageal reflux disease (GERD), esophageal spasm,  Mallory-Weiss syndrome, peptic ulcer disease, pancreatitis, functional gastrointestinal pain, cervical or thoracic disk disease or arthritis, shoulder arthritis, costochondritis, subacromial bursitis, anxiety or panic attack, herpes zoster, breast disorders, chest wall tumors, thoracic outlet syndrome, mediastinitis.  Paronychia, felon, cellulitis, septic tenosynovitis, compartment syndrome, fx, OA, osteomyelitis   Co morbidities that complicate the patient evaluation  CKD, CHF, DM, HTN   Additional history obtained:  Additional history obtained from CMP from 7 months ago External records from outside source obtained and reviewed including Creatine 3.2 GFR 19 for kidney function comparison   Lab Tests:  I Ordered, and personally interpreted labs.  The pertinent results include:  Initial troponin 62 with repeat 81, Hgb 10.8, BUN 35, Creatinine 5.39 with GFR 12.    Imaging Studies ordered:  I ordered imaging studies including Chest x-ray, X-ray right long finger  I independently visualized and interpreted imaging which showed  CXR: no acute abnormalities R long finger x-ray: Distal soft tissue swell with no evidence of osteomyelitis or Fx, No evidence of OA I agree with the radiologist interpretation   Cardiac Monitoring: / EKG:  The patient was maintained on a cardiac monitor.  I personally viewed and interpreted the cardiac monitored which showed an underlying rhythm of: sinus rhythm ST elevation in anterior leads with T wave inversion/minimal depression in inferior leads   Consultations Obtained:  I requested consultation with the nephrology,  and discussed lab and imaging findings as well as pertinent plan - they recommend: they will see him for further assessment.  Cardiology consult pending upon shift change Hospitalist consult pending upon shift change.    Problem List / ED Course / Critical interventions / Medication management  Paronychia I ordered medication  including lidocaine for infiltrative anesthetic   Reevaluation of the patient after these medicines showed that the patient improved I  have reviewed the patients home medicines and have made adjustments as needed    Test / Admission - Considered:  Chest pain/paronychia/CKD Vitals signs significant for HTN with BP 175/101. Recommend f/u with PCP/nephrology for further BP control. Otherwise within normal range and stable throughout visit. Laboratory/imaging studies significant for: CKD of unknown change from recent baseline. 2nd troponin resulted at end of shift showed elevation.  Patient to be placed on oral antibiotics for cellulitis/post abscess drainage of right long finger. Still waiting to hear back from nephrology regarding their opinion of patient. Cardiology to be consulted regarding patient's elevation of troponin and abnormal EKG. Hospitalist to be consulted regarding possible admission.  Patient admission status yet to be determined. Patient care handed off to Dr. Sondra Come at shift change.         Final Clinical Impression(s) / ED Diagnoses Final diagnoses:  Paronychia of finger, right  Chest pain, unspecified type  CKD (chronic kidney disease) stage 5, GFR less than 15 ml/min (HCC)    Rx / DC Orders ED Discharge Orders          Ordered    ECHOCARDIOGRAM COMPLETE        07/08/21 1656    doxycycline (VIBRAMYCIN) 50 MG capsule  2 times daily        07/08/21 1656    Discharge instructions        07/08/21 1656    aspirin EC 81 MG tablet  Daily        07/08/21 1700              Wilnette Kales, [place] 07/08/21 2033    Valarie Merino, MD 07/10/21 1054

## 2021-07-31 ENCOUNTER — Ambulatory Visit (INDEPENDENT_AMBULATORY_CARE_PROVIDER_SITE_OTHER): Payer: 59 | Admitting: Family Medicine

## 2021-07-31 ENCOUNTER — Encounter (HOSPITAL_BASED_OUTPATIENT_CLINIC_OR_DEPARTMENT_OTHER): Payer: Self-pay | Admitting: Family Medicine

## 2021-07-31 VITALS — BP 194/103 | HR 75 | Ht 66.0 in | Wt 182.6 lb

## 2021-07-31 DIAGNOSIS — E1122 Type 2 diabetes mellitus with diabetic chronic kidney disease: Secondary | ICD-10-CM | POA: Diagnosis not present

## 2021-07-31 DIAGNOSIS — I10 Essential (primary) hypertension: Secondary | ICD-10-CM

## 2021-07-31 DIAGNOSIS — N184 Chronic kidney disease, stage 4 (severe): Secondary | ICD-10-CM | POA: Diagnosis not present

## 2021-07-31 DIAGNOSIS — Z794 Long term (current) use of insulin: Secondary | ICD-10-CM | POA: Diagnosis not present

## 2021-07-31 MED ORDER — INSULIN GLARGINE-YFGN 100 UNIT/ML ~~LOC~~ SOPN: 10.0000 [IU] | PEN_INJECTOR | Freq: Every day | SUBCUTANEOUS | 1 refills | Status: DC

## 2021-07-31 NOTE — Assessment & Plan Note (Addendum)
Patient continues with glimepiride.  Has stopped anemia.  Was utilizing Lantus but this is not covered by insurance and he is requesting an alternative brand of insulin glargine.  He thinks that [facility] is covered.  Has not been taking Lantus for some time now, does indicate that fasting blood sugars have still been in the 100s, has been variable based on diet. New prescription sent to pharmacy on file for patient to obtain insulin.  Advised that if the insulin sent is not covered well that he contact us to let us know and we can try an alternative brand We will plan to check labs today including A1c and CMP Patient indicates that he does not currently have an eye doctor that he is seeing, he is in need for eye exam, particularly for retinopathy screening, referral placed today

## 2021-08-01 LAB — COMPREHENSIVE METABOLIC PANEL
ALT: 10 IU/L (ref 0–44)
AST: 14 IU/L (ref 0–40)
Albumin/Globulin Ratio: 1 — ABNORMAL LOW (ref 1.2–2.2)
Albumin: 3.4 g/dL — ABNORMAL LOW (ref 4.0–5.0)
Alkaline Phosphatase: 93 IU/L (ref 44–121)
BUN/Creatinine Ratio: 9 (ref 9–20)
BUN: 43 mg/dL — ABNORMAL HIGH (ref 6–24)
Bilirubin Total: 0.3 mg/dL (ref 0.0–1.2)
CO2: 20 mmol/L (ref 20–29)
Calcium: 8.3 mg/dL — ABNORMAL LOW (ref 8.7–10.2)
Chloride: 108 mmol/L — ABNORMAL HIGH (ref 96–106)
Creatinine, Ser: 5.02 mg/dL — ABNORMAL HIGH (ref 0.76–1.27)
Globulin, Total: 3.4 g/dL (ref 1.5–4.5)
Glucose: 98 mg/dL (ref 70–99)
Potassium: 5.1 mmol/L (ref 3.5–5.2)
Sodium: 140 mmol/L (ref 134–144)
Total Protein: 6.8 g/dL (ref 6.0–8.5)
eGFR: 13 mL/min/{1.73_m2} — ABNORMAL LOW (ref >59)

## 2021-08-01 LAB — HEMOGLOBIN A1C
Est. average glucose Bld gHb Est-mCnc: 200 mg/dL
Hgb A1c MFr Bld: 8.6 % — ABNORMAL HIGH (ref 4.8–5.6)

## 2021-08-30 ENCOUNTER — Ambulatory Visit (HOSPITAL_BASED_OUTPATIENT_CLINIC_OR_DEPARTMENT_OTHER): Payer: Self-pay | Admitting: Family Medicine

## 2021-08-30 ENCOUNTER — Telehealth (HOSPITAL_BASED_OUTPATIENT_CLINIC_OR_DEPARTMENT_OTHER): Payer: Self-pay

## 2021-08-30 NOTE — Telephone Encounter (Signed)
Key: [place] Department is unable to review this request because no prior authorization is needed for this member/medication at this time.  Will notify pharmacy of outcome via fax [phone]

## 2021-09-03 ENCOUNTER — Encounter (HOSPITAL_BASED_OUTPATIENT_CLINIC_OR_DEPARTMENT_OTHER): Payer: Self-pay | Admitting: Family Medicine

## 2021-09-12 ENCOUNTER — Encounter (INDEPENDENT_AMBULATORY_CARE_PROVIDER_SITE_OTHER): Payer: 59 | Admitting: Ophthalmology

## 2021-10-26 ENCOUNTER — Other Ambulatory Visit: Payer: Self-pay | Admitting: *Deleted

## 2021-10-26 DIAGNOSIS — N184 Chronic kidney disease, stage 4 (severe): Secondary | ICD-10-CM

## 2021-11-05 ENCOUNTER — Ambulatory Visit (HOSPITAL_COMMUNITY)
Admission: RE | Admit: 2021-11-05 | Discharge: 2021-11-05 | Disposition: A | Discharge status: Home / Self Care | Payer: Medicaid Other | Source: Ambulatory Visit | Attending: Surgery | Admitting: Surgery

## 2021-11-05 ENCOUNTER — Ambulatory Visit: Payer: Medicaid Other | Admitting: Surgery

## 2021-11-05 ENCOUNTER — Encounter: Payer: Self-pay | Admitting: Surgery

## 2021-11-05 ENCOUNTER — Ambulatory Visit (INDEPENDENT_AMBULATORY_CARE_PROVIDER_SITE_OTHER)
Admission: RE | Admit: 2021-11-05 | Discharge: 2021-11-05 | Disposition: A | Discharge status: Home / Self Care | Payer: Medicaid Other | Source: Ambulatory Visit | Attending: Surgery | Admitting: Surgery

## 2021-11-05 VITALS — BP 165/94 | HR 66 | Temp 98.2°F | Resp 20 | Ht 66.0 in | Wt 182.0 lb

## 2021-11-05 DIAGNOSIS — N184 Chronic kidney disease, stage 4 (severe): Secondary | ICD-10-CM

## 2021-11-05 NOTE — Progress Notes (Signed)
[facility] [place]  Patient name: Timothy [name] MRN: [id number] DOB: 09-12-1970 Sex: male   REQUESTING PROVIDER:    Dr. Royce Macadamia   REASON FOR CONSULT:    Dialysis access  HISTORY OF PRESENT ILLNESS:   Timothy [name] is a 51 y.o. male, who is here today for evaluation of dialysis access.  The patient's renal failure secondary to diabetes and hypertension.  He is right-handed.  He does not have a pacemaker or defibrillator.  He is not on anticoagulation.  Patient is medically managed for hypertension.  He takes a statin for hypercholesterolemia.  He is a non-smoker.  He does suffer from congestive heart failure.  PAST MEDICAL HISTORY    Past Medical History:  Diagnosis Date   CHF (congestive heart failure) ([place])    Chronic kidney disease    Diabetes mellitus without complication ([place])    Hypertension      FAMILY HISTORY   History reviewed. No pertinent family history.  SOCIAL HISTORY:   Social History   Socioeconomic History   Marital status: Single    Spouse name: Not on file   Number of children: Not on file   Years of education: Not on file   Highest education level: Not on file  Occupational History   Not on file  Tobacco Use   Smoking status: Never   Smokeless tobacco: Never  Vaping Use   Vaping Use: Never used  Substance and Sexual Activity   Alcohol use: Not Currently   Drug use: Not Currently   Sexual activity: Not on file  Other Topics Concern   Not on file  Social History Narrative   Not on file   Social Determinants of Health   Financial Resource Strain: Not on file  Food Insecurity: Not on file  Transportation Needs: Not on file  Physical Activity: Not on file  Stress: Not on file  Social Connections: Not on file  Intimate Partner Violence: Not on file    ALLERGIES:    No Known Allergies  CURRENT MEDICATIONS:    Current Outpatient Medications  Medication  Sig Dispense Refill   amLODipine (NORVASC) 5 MG tablet Take 5 mg by mouth daily.     amLODipine-benazepril (LOTREL) 10-20 MG capsule Take 1 capsule by mouth daily.     aspirin EC 81 MG tablet Take 1 tablet (81 mg total) by mouth daily. Swallow whole. 90 tablet 3   atorvastatin (LIPITOR) 40 MG tablet Take 1 tablet (40 mg total) by mouth daily. 30 tablet 2   doxycycline (VIBRAMYCIN) 50 MG capsule Take 2 capsules (100 mg total) by mouth 2 (two) times daily. 28 capsule 0   furosemide (LASIX) 80 MG tablet Take 80 mg by mouth daily.     glimepiride (AMARYL) 4 MG tablet Take 4 mg by mouth daily.     hydrALAZINE (APRESOLINE) 100 MG tablet Take 100 mg by mouth 3 (three) times daily.     hydrOXYzine (VISTARIL) 25 MG capsule Take 25 mg by mouth at bedtime.     insulin glargine-yfgn (SEMGLEE) 100 UNIT/ML Pen Inject 10 Units into the skin daily. 15 mL 1   insulin lispro (HUMALOG) 100 UNIT/ML KwikPen SMARTSIG:2-15 Unit(s) SUB-Q 3 Times Daily     isosorbide mononitrate (IMDUR) 30 MG 24 hr tablet Take 1 tablet (30 mg total) by mouth daily. 30 tablet 2   levocetirizine (XYZAL) 5 MG tablet SMARTSIG:1 Tablet(s) By Mouth Every Evening     meloxicam (MOBIC) 15  MG tablet Take 15 mg by mouth daily.     metoprolol succinate (TOPROL-XL) 50 MG 24 hr tablet Take 50 mg by mouth daily.     omeprazole (PRILOSEC) 40 MG capsule Take 40 mg by mouth daily.     metoCLOPramide (REGLAN) 5 MG tablet Take 1 tablet (5 mg total) by mouth 3 (three) times daily before meals. 90 tablet 1   No current facility-administered medications for this visit.    REVIEW OF SYSTEMS:   [X]  denotes positive finding, [ ]  denotes negative finding Cardiac  Comments:  Chest pain or chest pressure:    Shortness of breath upon exertion:    Short of breath when lying flat:    Irregular heart rhythm:        Vascular    Pain in calf, thigh, or hip brought on by ambulation:    Pain in feet at night that wakes you up from your sleep:     Blood clot  in your veins:    Leg swelling:         Pulmonary    Oxygen at home:    Productive cough:     Wheezing:         Neurologic    Sudden weakness in arms or legs:     Sudden numbness in arms or legs:     Sudden onset of difficulty speaking or slurred speech:    Temporary loss of vision in one eye:     Problems with dizziness:         Gastrointestinal    Blood in stool:      Vomited blood:         Genitourinary    Burning when urinating:     Blood in urine:        Psychiatric    Major depression:         Hematologic    Bleeding problems:    Problems with blood clotting too easily:        Skin    Rashes or ulcers:        Constitutional    Fever or chills:     PHYSICAL EXAM:   Vitals:   11/05/21 1119  BP: (!) 165/94  Pulse: 66  Resp: 20  Temp: 98.2 F (36.8 C)  SpO2: 97%  Weight: 182 lb (82.6 kg)  Height: 5\' 6"  (1.676 m)    GENERAL: The patient is a well-nourished male, in no acute distress. The vital signs are documented above. CARDIAC: There is a regular rate and rhythm.  VASCULAR: Palpable left brachial and radial pulse PULMONARY: Nonlabored respirations MUSCULOSKELETAL: There are no major deformities or cyanosis. NEUROLOGIC: No focal weakness or paresthesias are detected. SKIN: There are no ulcers or rashes noted. PSYCHIATRIC: The patient has a normal affect.  STUDIES:   I reviewed his vein mapping with the following findings: +-----------------+-------------+----------+---------+  Right Cephalic   Diameter (cm)Depth (cm)Findings   +-----------------+-------------+----------+---------+  Shoulder             0.53                          +-----------------+-------------+----------+---------+  Prox upper arm       0.53               branching  +-----------------+-------------+----------+---------+  Mid upper arm        0.46                          +-----------------+-------------+----------+---------+  Dist upper arm       0.41                branching  +-----------------+-------------+----------+---------+  Antecubital fossa    0.47                          +-----------------+-------------+----------+---------+  Prox forearm         0.23               branching  +-----------------+-------------+----------+---------+  Mid forearm          0.21                          +-----------------+-------------+----------+---------+  Dist forearm         0.21                          +-----------------+-------------+----------+---------+   +-----------------+-------------+----------+--------+  Right Basilic    Diameter (cm)Depth (cm)Findings  +-----------------+-------------+----------+--------+  Mid upper arm        0.51                         +-----------------+-------------+----------+--------+  Dist upper arm       0.57                         +-----------------+-------------+----------+--------+  Antecubital fossa    0.44                         +-----------------+-------------+----------+--------+   +-----------------+-------------+----------+---------+  Left Cephalic    Diameter (cm)Depth (cm)Findings   +-----------------+-------------+----------+---------+  Shoulder             0.48                          +-----------------+-------------+----------+---------+  Prox upper arm       0.36                          +-----------------+-------------+----------+---------+  Mid upper arm        0.38                          +-----------------+-------------+----------+---------+  Dist upper arm       0.39                          +-----------------+-------------+----------+---------+  Antecubital fossa    0.45                          +-----------------+-------------+----------+---------+  Prox forearm         0.23                          +-----------------+-------------+----------+---------+  Mid forearm          0.18                branching  +-----------------+-------------+----------+---------+  Dist forearm         0.17                          +-----------------+-------------+----------+---------+   +-----------------+-------------+----------+--------+  Left Basilic  Diameter (cm)Depth (cm)Findings  +-----------------+-------------+----------+--------+  Mid upper arm        0.43                         +-----------------+-------------+----------+--------+  Dist upper arm       0.43                         +-----------------+-------------+----------+--------+  Antecubital fossa    0.46                         +-----------------+-------------+----------+--------+  Prox forearm         0.33                         +-----------------+-------------+----------+--------+ Arterial imaging shows high brachial bifurcation ASSESSMENT and PLAN   CKD 4: This is a right-handed gentleman in need of permanent access.  After reviewing his vein mapping, he has a Left cephalic vein in the upper arm.  I discussed proceeding with a left brachiocephalic fistula.  I discussed the risk of not maturity, the need for future interventions, and the risk of steal syndrome.  We will get this scheduled in the near future.  All questions were answered.   Leia Alf, MD, FACS [facility] [phone] Pager [phone]

## 2021-11-06 ENCOUNTER — Other Ambulatory Visit: Payer: Self-pay

## 2021-11-06 DIAGNOSIS — N184 Chronic kidney disease, stage 4 (severe): Secondary | ICD-10-CM

## 2021-11-09 ENCOUNTER — Telehealth: Payer: Self-pay

## 2021-11-09 NOTE — Telephone Encounter (Signed)
Telephone message received from [place] at [facility] requesting an earlier surgery date for fistula placement. Patient originally requested to schedule after Thanksgiving, but has since decided after discussing with Dr. Harrie Jeans to move it up.   Attempted to reach patient to reschedule surgery. Left vm to return call.

## 2021-11-09 NOTE — Telephone Encounter (Signed)
Spoke with patient. Surgery rescheduled to 12/06/21, arriving at [facility] admitting at 0530 AM. Instructions reviewed. Patient verbalized understanding and denied the need for a letter.

## 2021-12-05 ENCOUNTER — Encounter (HOSPITAL_COMMUNITY): Payer: Self-pay | Admitting: Surgery

## 2021-12-05 ENCOUNTER — Encounter (HOSPITAL_COMMUNITY): Payer: Self-pay | Admitting: Vascular Surgery

## 2021-12-05 NOTE — Progress Notes (Signed)
Anesthesia Chart Review: Timothy [name]  Case: [id number] Date/Time: 12/06/21 0715   Procedure: LEFT ARM ARTERIOVENOUS (AV) FISTULA CREATION (Left)   Anesthesia type: Choice   Pre-op diagnosis: Chronic Kidney Disease Stage IV   Location: [facility] OR ROOM 11 / [place] OR   Surgeons: Serafina Mitchell, MD       DISCUSSION: Patient is a 51 year old male scheduled for the above procedure.  History includes never smoker, DM2, HTN, CKD stage IV, chronic diastolic CHF.   Last cardiology visit with Dr. Gardiner Rhyme was on 04/12/21 for follow-up HTN and diastolic CHF. BP 215/112 and reported sudden onset of SOB, so referred to the ED. I don't see an ED encounter for that date. Last ED visit seen 07/08/21 for right middle finger infection s/p I&D and had panic attack with chest pain. Troponins elevated but overall felt flat and in setting of concurrent AKI (acute on chronic renal failure) was not felt to represent ACS. Creatinine up to 5.39, so nephrology consulted but did not feel there was an indication for urgent initiation of dialysis. Out-patient nephrology and cardiology follow-up recommended. He had PCP follow-up with [name], [name], MD but has not yet scheduled cardiology follow-up. BP still not at goal, but he was compliant with amlodipine-benazepril, hydralazine, Imdur, metoprolol at that time without chest pain or headaches. Last BP noted was 165/94 on 11/05/21.   He has known inferior T wave abnormality on EKG. Echo on 06/22/20 showed LVEF 60-65%, no regional wall motion abnormalities, moderate LVH, normal RVSF, RVSP 25.1 mmHg, trivial MR/AI.  He was referred to Dr. Trula Slade for future hemodialysis access. Patient wanted surgery after Thanksgiving, but nephrology asked that surgery be moved up and is now scheduled for 12/06/21.   He is a same day work-up, so anesthesia team to evaluate on the day of surgery.     VS:  BP Readings from Last 3 Encounters:  11/05/21 (!) 165/94  07/31/21 (!) 194/103  07/08/21  (!) 178/104   Pulse Readings from Last 3 Encounters:  11/05/21 66  07/31/21 75  07/08/21 93     PROVIDERS: [name], [name], MD is PCP Harrie Jeans, MD is nephrologist. He has also been referred to [facility] for consideration of renal transplant evaluation.  Oswaldo Milian, MD is cardiologist   LABS: For labs on arrival for surgery. As of 07/31/21, Cr 5.02, K 5.1, glucose 98, A1c 8.6%. H/H 10.8/33.1%   IMAGES: CXR 07/08/21: FINDINGS: The heart size and mediastinal contours are within normal limits. Both lungs are clear. The visualized skeletal structures are unremarkable. IMPRESSION: No active cardiopulmonary disease.   EKG: 07/08/21:  Normal sinus rhythm Possible Left atrial enlargement Minimal voltage criteria for LVH, may be normal variant ( Cornell product ) T wave abnormality, consider inferior ischemia Abnormal ECG When compared with ECG of 14-Nov-2020 21:18, PREVIOUS ECG IS PRESENT Confirmed by Dene Gentry [phone]) on 07/08/2021 2:55:11 PM - Inferior T wave inversion also present on EKG dating back to 07/05/20 ([facility])   CV: Echo 06/22/20: IMPRESSIONS   1. Left ventricular ejection fraction, by estimation, is 60 to 65%. The  left ventricle has normal function. The left ventricle has no regional  wall motion abnormalities. There is moderate left ventricular hypertrophy.  Left ventricular diastolic  parameters are indeterminate.   2. Right ventricular systolic function is normal. The right ventricular  size is normal. There is normal pulmonary artery systolic pressure. The  estimated right ventricular systolic pressure is 94.1 mmHg.   3. Left  atrial size was mildly dilated.   4. The mitral valve is normal in structure. Trivial mitral valve  regurgitation. No evidence of mitral stenosis.   5. The aortic valve is tricuspid. Aortic valve regurgitation is trivial.  No aortic stenosis is present.   6. The inferior vena cava is normal in size with greater  than 50%  respiratory variability, suggesting right atrial pressure of 3 mmHg.    Past Medical History:  Diagnosis Date   CHF (congestive heart failure) ([facility])    Chronic kidney disease    Diabetes mellitus without complication ([place])    Hypertension     Past Surgical History:  Procedure Laterality Date   EYE SURGERY      MEDICATIONS: No current facility-administered medications for this encounter.    amLODipine (NORVASC) 5 MG tablet   amLODipine-benazepril (LOTREL) 10-20 MG capsule   aspirin EC 81 MG tablet   atorvastatin (LIPITOR) 40 MG tablet   doxycycline (VIBRAMYCIN) 50 MG capsule   furosemide (LASIX) 80 MG tablet   glimepiride (AMARYL) 4 MG tablet   hydrALAZINE (APRESOLINE) 100 MG tablet   hydrOXYzine (VISTARIL) 25 MG capsule   insulin glargine-yfgn (SEMGLEE) 100 UNIT/ML Pen   insulin lispro (HUMALOG) 100 UNIT/ML KwikPen   isosorbide mononitrate (IMDUR) 30 MG 24 hr tablet   levocetirizine (XYZAL) 5 MG tablet   meloxicam (MOBIC) 15 MG tablet   metoCLOPramide (REGLAN) 5 MG tablet   metoprolol succinate (TOPROL-XL) 50 MG 24 hr tablet   omeprazole (PRILOSEC) 40 MG capsule    Myra Gianotti, PA-C Surgical Short Stay/Anesthesiology [facility] Phone [phone] [facility] Phone [phone] 12/05/2021 10:42 AM

## 2021-12-05 NOTE — Progress Notes (Signed)
PCP - Dr Raymond [name] Cardiologist - Dr Oswaldo Milian  Chest x-ray - 07/08/21 (2V) EKG - 07/08/21 Stress Test - n/a ECHO - 06/22/20 Cardiac Cath - n/a  ICD Pacemaker/Loop - n/a  Sleep Study -  n/a CPAP - none  Do not take Glimepiride on the morning of surgery.      THE MORNING OF SURGERY, take 5 Units of Insulin Glargine.  Do not take Humalog Insulin on the day of surgery unless your CBG is greater than 220 mg/dL.  If CBG greater than 220 mg/d:, you may take  of your sliding scale (correction) dose of insulin.  If your blood sugar is less than 70 mg/dL, you will need to treat for low blood sugar: Treat a low blood sugar (less than 70 mg/dL) with  cup of clear juice (cranberry or apple), 4 glucose tablets, OR glucose gel. Recheck blood sugar in 15 minutes after treatment (to make sure it is greater than 70 mg/dL). If your blood sugar is not greater than 70 mg/dL on recheck, call [phone] for further instructions.  Anesthesia review: Yes  STOP now taking Mobic, Aspirin (unless otherwise instructed by your surgeon), Aleve, Naproxen, Ibuprofen, Motrin, Advil, Goody's, BC's, all herbal medications, fish oil, and all vitamins.   Coronavirus Screening Do you have any of the following symptoms:  Cough yes/no: No Fever (>100.58F)  yes/no: No Runny nose yes/no: No Sore throat yes/no: No Difficulty breathing/shortness of breath  yes/no: No  Have you traveled in the last 14 days and where? yes/no: No  Patient verbalized understanding of instructions that were given via phone.

## 2021-12-05 NOTE — Anesthesia Preprocedure Evaluation (Deleted)
Anesthesia Evaluation  Patient identified by MRN, date of birth, ID band Patient awake    Reviewed: Allergy & Precautions, NPO status , Patient's Chart, lab work & pertinent test results  History of Anesthesia Complications (+) PONV, Family history of anesthesia reaction and history of anesthetic complications  Airway Mallampati: III  TM Distance: >3 FB Neck ROM: Full    Dental  (+) Poor Dentition, Dental Advisory Given, Chipped   Pulmonary    Pulmonary exam normal breath sounds clear to auscultation       Cardiovascular hypertension, Pt. on medications + CAD, + Past MI, + Cardiac Stents, + CABG, + Peripheral Vascular Disease and +CHF   Rhythm:Regular Rate:Normal  Echo 11/2020  1. Left ventricular ejection fraction, by estimation, is 45 to 50%. The left ventricle has mildly decreased function. The left ventricle demonstrates regional wall motion abnormalities (abnormal septal motion). Left ventricular diastolic parameters are indeterminate.   2. Right ventricular systolic function is normal. The right ventricular size is normal. Tricuspid regurgitation signal is inadequate for assessing PA pressure.   3. The mitral valve is normal in structure. Trivial mitral valve regurgitation.   4. The aortic valve is tricuspid. There is mild thickening of the aortic valve. Aortic valve regurgitation is not visualized. No aortic stenosis is present.   Comparison(s): A prior study was performed on 11/05/2019. Significant improvement in LVEF from prior.   Echo 11/2019  1. Left ventricular ejection fraction, by estimation, is 20 to 25%. The left ventricle has severely decreased function. The left ventricle demonstrates global hypokinesis. There is mild left ventricular hypertrophy. Left ventricular diastolic parameters  are indeterminate.   2. Right ventricular systolic function is normal. The right ventricular size is normal.   3. Left atrial size  was mildly dilated.   4. The mitral valve is normal in structure. Trivial mitral valve regurgitation. No evidence of mitral stenosis.   5. The aortic valve is normal in structure. Aortic valve regurgitation is not visualized. No aortic stenosis is present.   6. The inferior vena cava is normal in size with greater than 50% respiratory variability, suggesting right atrial pressure of 3 mmHg.   Conclusion(s)/Recommendation(s): No intracardiac source of embolism  detected on this transthoracic study. A transesophageal echocardiogram is  recommended to exclude cardiac source of embolism if clinically indicated.     Echo 11/2019  1. No apical thrombus noted using definity.   2. Left ventricular ejection fraction, by estimation, is 20 to 25%. The  left ventricle has severely decreased function. The left ventricle  demonstrates global hypokinesis. Left ventricular diastolic parameters are  consistent with Grade III diastolic  dysfunction (restrictive).   3. Right ventricular systolic function was not well visualized.   4. The mitral valve is normal in structure. Trivial mitral valve  regurgitation. No evidence of mitral stenosis.   5. The aortic valve is tricuspid. Aortic valve regurgitation is not  visualized. No aortic stenosis is present.    Echo   1. The left ventricle has moderately reduced systolic function, with an  ejection fraction of 35-40%. The cavity size was normal. There is mildly  increased left ventricular wall thickness. Left ventricular diastolic  Doppler parameters are consistent with   restrictive filling. Elevated left atrial and left ventricular  end-diastolic pressures The E/e' is >20. Left ventricular diffuse  hypokinesis.   2. The mitral valve is grossly normal.   3. The tricuspid valve is grossly normal.   4. The aortic valve is tricuspid. No stenosis   of the aortic valve.   5. The aorta is normal in size and structure.   6. The inferior vena cava was normal in  size with <50% respiratory  variability.   7. When compared to the prior study: 03/16/2018: LVEF 30-35%.    Neuro/Psych  Headaches PSYCHIATRIC DISORDERS Anxiety Depression Bipolar Disorder   ADD (attention deficit disorder)TIACVA, No Residual Symptoms    GI/Hepatic Neg liver ROS,GERD  Medicated and Controlled,,  Endo/Other  diabetes, Poorly Controlled, Type 2, Insulin Dependent, Oral Hypoglycemic Agents  Endotool  Renal/GU negative Renal ROS     Musculoskeletal  (+) Arthritis ,  Chronic lower back pain   Abdominal   Peds  Hematology  (+) Blood dyscrasia, anemia   Anesthesia Other Findings Vitreous hemorrhage left eye  Reproductive/Obstetrics                              Lab Results  Component Value Date   WBC 15.9 (H) 09/05/2020   HGB 14.8 09/05/2020   HCT 44.8 09/05/2020   MCV 89.1 09/05/2020   PLT 262 09/05/2020   Lab Results  Component Value Date   CREATININE 0.59 (L) 11/06/2020   BUN 21 (H) 11/06/2020   NA 139 11/06/2020   K 4.3 11/06/2020   CL 102 11/06/2020   CO2 27 11/06/2020    Anesthesia Physical Anesthesia Plan  ASA: 4  Anesthesia Plan: MAC   Post-op Pain Management: Tylenol PO (pre-op)*   Induction: Intravenous  PONV Risk Score and Plan: 3 and Ondansetron, Treatment may vary due to age or medical condition and Propofol infusion  Airway Management Planned: Simple Face Mask  Additional Equipment: ClearSight  Intra-op Plan:   Post-operative Plan:   Informed Consent: I have reviewed the patients History and Physical, chart, labs and discussed the procedure including the risks, benefits and alternatives for the proposed anesthesia with the patient or authorized representative who has indicated his/her understanding and acceptance.     Dental advisory given  Plan Discussed with: CRNA  Anesthesia Plan Comments: (PAT note written 12/05/2021 by Avrom Robarts, PA-C. )         Anesthesia Quick Evaluation  

## 2021-12-06 ENCOUNTER — Ambulatory Visit (HOSPITAL_COMMUNITY): Admission: RE | Admit: 2021-12-06 | Payer: Medicaid Other | Source: Home / Self Care | Admitting: Surgery

## 2021-12-06 SURGERY — ARTERIOVENOUS (AV) FISTULA CREATION
Anesthesia: Choice | Laterality: Left

## 2021-12-17 ENCOUNTER — Other Ambulatory Visit: Payer: Self-pay

## 2021-12-17 DIAGNOSIS — N184 Chronic kidney disease, stage 4 (severe): Secondary | ICD-10-CM

## 2022-01-14 ENCOUNTER — Other Ambulatory Visit: Payer: Self-pay

## 2022-01-14 ENCOUNTER — Encounter (HOSPITAL_COMMUNITY): Payer: Self-pay | Admitting: Surgery

## 2022-01-14 NOTE — Telephone Encounter (Signed)
Surgery had previously been moved from 12/06/21 to 01/16/22 per patient request due to transportation. Patient called office today stating that his ride on 01/16/22 is no longer available and request to reschedule surgery preferably before end of year. He stated that he intends to have surgery. Provided new surgery date of 01/30/22 Instructions reviewed- patient verbalized understanding.

## 2022-01-14 NOTE — Progress Notes (Signed)
PCP - Dr Raymond [name] Cardiologist - Dr Oswaldo Milian   Chest x-ray -  07/08/21  EKG - 07/08/21 ECHO - 06/22/20  Aspirin Instructions: Continue  ERAS Protcol - NPO  Anesthesia review: Originally reviewed on 12/05/20 by Ebony Hail  Patient verbally denies any shortness of breath, fever, cough and chest pain during phone call   -------------  SDW INSTRUCTIONS given:  Your procedure is scheduled on 01/16/22.  Report to [facility] Main Entrance "A" at [place]M[address] and check in at the Admitting office.  Call this number if you have problems the morning of surgery:  [phone]   Remember:  Do not eat or drink after midnight the night before your surgery     Take these medicines the morning of surgery with A SIP OF WATER  amLODipine (NORVASC)  hydrALAZINE  isosorbide mononitrate (IMDUR)  labetalol (NORMODYNE)  omeprazole (PRILOSEC)   **Do not take Glimepiride on the morning of surgery.**  **The night before surgery ONLY take 1/2 the dose of your LANTUS SOLOSTAR**  ** PLEASE check your blood sugar the morning of your surgery when you wake up and every 2 hours until you get to the Short Stay unit.  If your blood sugar is less than 70 mg/dL, you will need to treat for low blood sugar: Do not take insulin. Treat a low blood sugar (less than 70 mg/dL) with  cup of clear juice (cranberry or apple), 4 glucose tablets, OR glucose gel. Recheck blood sugar in 15 minutes after treatment (to make sure it is greater than 70 mg/dL). If your blood sugar is not greater than 70 mg/dL on recheck, call [phone] for further instructions.  As of today, STOP taking any Aspirin (unless otherwise instructed by your surgeon), MOBIC, Aleve, Naproxen, Ibuprofen, Motrin, Advil, Goody's, BC's, all herbal medications, fish oil, and all vitamins.                      Do not wear jewelry, make up, or nail polish            Do not wear lotions, powders, perfumes/colognes, or deodorant.            Do  not shave 48 hours prior to surgery.  Men may shave face and neck.            Do not bring valuables to the hospital.            [facility] is not responsible for any belongings or valuables.  Do NOT Smoke (Tobacco/Vaping) 24 hours prior to your procedure If you use a CPAP at night, you may bring all equipment for your overnight stay.   Contacts, glasses, dentures or bridgework may not be worn into surgery.      For patients admitted to the hospital, discharge time will be determined by your treatment team.   Patients discharged the day of surgery will not be allowed to drive home, and someone needs to stay with them for 24 hours.    Special instructions:   [place]- Preparing For Surgery  Before surgery, you can play an important role. Because skin is not sterile, your skin needs to be as free of germs as possible. You can reduce the number of germs on your skin by washing with CHG (chlorahexidine gluconate) Soap before surgery.  CHG is an antiseptic cleaner which kills germs and bonds with the skin to continue killing germs even after washing.    Oral Hygiene is also  important to reduce your risk of infection.  Remember - BRUSH YOUR TEETH THE MORNING OF SURGERY WITH YOUR REGULAR TOOTHPASTE  Please do not use if you have an allergy to CHG or antibacterial soaps. If your skin becomes reddened/irritated stop using the CHG.  Do not shave (including legs and underarms) for at least 48 hours prior to first CHG shower. It is OK to shave your face.  Please follow these instructions carefully.   Shower the NIGHT BEFORE SURGERY and the MORNING OF SURGERY with DIAL Soap.   Pat yourself dry with a CLEAN TOWEL.  Wear CLEAN PAJAMAS to bed the night before surgery  Place CLEAN SHEETS on your bed the night of your first shower and DO NOT SLEEP WITH PETS.   Day of Surgery: Please shower morning of surgery  Wear Clean/Comfortable clothing the morning of surgery Do not apply any  deodorants/lotions.   Remember to brush your teeth WITH YOUR REGULAR TOOTHPASTE.   Questions were answered. Patient verbalized understanding of instructions.

## 2022-01-16 MED ORDER — KETOROLAC TROMETHAMINE 30 MG/ML IJ SOLN
INTRAMUSCULAR | Status: AC
  Filled 2022-01-16: qty 1

## 2022-01-16 MED ORDER — ACETAMINOPHEN 10 MG/ML IV SOLN
INTRAVENOUS | Status: AC
  Filled 2022-01-16: qty 100

## 2022-01-16 MED ORDER — HYDROMORPHONE HCL 1 MG/ML IJ SOLN
INTRAMUSCULAR | Status: AC
  Filled 2022-01-16: qty 0.5

## 2022-01-27 ENCOUNTER — Other Ambulatory Visit (HOSPITAL_BASED_OUTPATIENT_CLINIC_OR_DEPARTMENT_OTHER): Payer: Self-pay | Admitting: Family Medicine

## 2022-01-29 NOTE — Progress Notes (Signed)
Called pt to go over instructions. Patient answered the phone with a hoarse voice and coughing. When asked if he was sick, he said he had the flu. Provided pt with office number to reschedule surgery. Will communicate with the OR desk.

## 2022-01-30 ENCOUNTER — Ambulatory Visit (HOSPITAL_COMMUNITY): Admission: RE | Admit: 2022-01-30 | Payer: Medicaid Other | Source: Ambulatory Visit | Admitting: Surgery

## 2022-01-30 SURGERY — ARTERIOVENOUS (AV) FISTULA CREATION
Anesthesia: Choice | Laterality: Left

## 2022-02-06 ENCOUNTER — Ambulatory Visit (HOSPITAL_BASED_OUTPATIENT_CLINIC_OR_DEPARTMENT_OTHER): Payer: Medicaid Other | Admitting: Family Medicine

## 2022-06-25 ENCOUNTER — Other Ambulatory Visit (HOSPITAL_BASED_OUTPATIENT_CLINIC_OR_DEPARTMENT_OTHER): Payer: Self-pay | Admitting: Family Medicine

## 2022-06-25 DIAGNOSIS — N184 Chronic kidney disease, stage 4 (severe): Secondary | ICD-10-CM

## 2022-08-05 ENCOUNTER — Ambulatory Visit (INDEPENDENT_AMBULATORY_CARE_PROVIDER_SITE_OTHER): Payer: Medicaid Other | Admitting: Family Medicine

## 2022-08-05 ENCOUNTER — Encounter (HOSPITAL_BASED_OUTPATIENT_CLINIC_OR_DEPARTMENT_OTHER): Payer: Self-pay | Admitting: Family Medicine

## 2022-08-05 VITALS — BP 196/107 | HR 80 | Ht 66.0 in | Wt 172.4 lb

## 2022-08-05 DIAGNOSIS — E1122 Type 2 diabetes mellitus with diabetic chronic kidney disease: Secondary | ICD-10-CM | POA: Diagnosis not present

## 2022-08-05 DIAGNOSIS — I10 Essential (primary) hypertension: Secondary | ICD-10-CM

## 2022-08-05 DIAGNOSIS — N184 Chronic kidney disease, stage 4 (severe): Secondary | ICD-10-CM | POA: Diagnosis not present

## 2022-08-05 DIAGNOSIS — K920 Hematemesis: Secondary | ICD-10-CM | POA: Insufficient documentation

## 2022-08-05 DIAGNOSIS — Z794 Long term (current) use of insulin: Secondary | ICD-10-CM

## 2022-08-05 MED ORDER — PEN NEEDLES 31G X 5 MM MISC: 1.0000 | Freq: Every day | 1 refills | Status: DC

## 2022-08-05 NOTE — Progress Notes (Unsigned)
    Procedures performed today:    None.  Independent interpretation of notes and tests performed by another provider:   None.  Brief History, Exam, Impression, and Recommendations:    BP (!) 196/107 (BP Location: Left Arm, Patient Position: Sitting, Cuff Size: Normal)   Pulse 80   Ht 5\' 6"  (1.676 m)   Wt 172 lb 6.4 oz (78.2 kg)   SpO2 100%   BMI 27.83 kg/m   Patient, unfortunately, is overdue for follow-up.  His last appointment was in June 2023 with recommendation for follow-up in about 1 month, unfortunately patient did not follow-up as recommended.  He reports that he has also not been following up with his specialist as recommended.  He indicates that he was not receiving the news that he liked and this led to him not returning for scheduled appointments.  He has began noticing issues and Geni Bers this is led to him deciding to seek medical care again. Generally, patient has been having issues with some exertional shortness of breath, fatigue, decreased stamina.  There are no diagnoses linked to this encounter.  No follow-ups on file.  Spent 46 minutes on this patient encounter, including preparation, chart review, face-to-face counseling with patient and coordination of care, and documentation of encounter   ___________________________________________ Trevone Prestwood [name], MD, ABFM, [facility] ([place])

## 2022-08-06 ENCOUNTER — Telehealth (HOSPITAL_BASED_OUTPATIENT_CLINIC_OR_DEPARTMENT_OTHER): Payer: Self-pay | Admitting: Family Medicine

## 2022-08-06 LAB — COMPREHENSIVE METABOLIC PANEL
ALT: 10 IU/L (ref 0–44)
AST: 15 IU/L (ref 0–40)
Albumin: 3.5 g/dL — ABNORMAL LOW (ref 3.8–4.9)
Alkaline Phosphatase: 57 IU/L (ref 44–121)
BUN/Creatinine Ratio: 7 — ABNORMAL LOW (ref 9–20)
BUN: 76 mg/dL (ref 6–24)
Bilirubin Total: 0.3 mg/dL (ref 0.0–1.2)
CO2: 14 mmol/L — ABNORMAL LOW (ref 20–29)
Calcium: 6 mg/dL — CL (ref 8.7–10.2)
Chloride: 110 mmol/L — ABNORMAL HIGH (ref 96–106)
Creatinine, Ser: 11.35 mg/dL — ABNORMAL HIGH (ref 0.76–1.27)
Globulin, Total: 3.5 g/dL (ref 1.5–4.5)
Glucose: 122 mg/dL — ABNORMAL HIGH (ref 70–99)
Potassium: 5.1 mmol/L (ref 3.5–5.2)
Sodium: 139 mmol/L (ref 134–144)
Total Protein: 7 g/dL (ref 6.0–8.5)
eGFR: 5 mL/min/{1.73_m2} — ABNORMAL LOW (ref >59)

## 2022-08-06 LAB — CBC WITH DIFFERENTIAL/PLATELET
Basophils Absolute: 0 10*3/uL (ref 0.0–0.2)
Basos: 0 %
EOS (ABSOLUTE): 0.4 10*3/uL (ref 0.0–0.4)
Eos: 5 %
Hematocrit: 23.4 % — ABNORMAL LOW (ref 37.5–51.0)
Hemoglobin: 7.5 g/dL — ABNORMAL LOW (ref 13.0–17.7)
Immature Grans (Abs): 0 10*3/uL (ref 0.0–0.1)
Immature Granulocytes: 0 %
Lymphocytes Absolute: 0.7 10*3/uL (ref 0.7–3.1)
Lymphs: 10 %
MCH: 25.3 pg — ABNORMAL LOW (ref 26.6–33.0)
MCHC: 32.1 g/dL (ref 31.5–35.7)
MCV: 79 fL (ref 79–97)
Monocytes Absolute: 0.7 10*3/uL (ref 0.1–0.9)
Monocytes: 11 %
Neutrophils Absolute: 5.2 10*3/uL (ref 1.4–7.0)
Neutrophils: 74 %
Platelets: 297 10*3/uL (ref 150–450)
RBC: 2.97 x10E6/uL — ABNORMAL LOW (ref 4.14–5.80)
RDW: 17.1 % — ABNORMAL HIGH (ref 11.6–15.4)
WBC: 7 10*3/uL (ref 3.4–10.8)

## 2022-08-06 LAB — HEMOGLOBIN A1C
Est. average glucose Bld gHb Est-mCnc: 120 mg/dL
Hgb A1c MFr Bld: 5.8 % — ABNORMAL HIGH (ref 4.8–5.6)

## 2022-08-06 NOTE — Assessment & Plan Note (Signed)
Patient has not been following up with nephrologist.  Uncertain of current status related to kidney function, although suspect that kidney function has notably worsened since last time we have obtained labs.  He had met with vascular surgery related to access for dialysis.  He was also working with kidney transplant service.  He has not followed up with any specialist recently. Discussed importance of following back up with specialist for appropriate management of underlying chronic medical conditions.  Poor control of high blood pressure, diabetes, chronic kidney disease can certainly lead to worsening overall health, numerous complications, premature death.  He was provided with contact information for nephrology office during visit today. We will check labs today as below

## 2022-08-06 NOTE — Assessment & Plan Note (Signed)
Blood pressure notably elevated in office today, remains elevated on recheck.  Patient has not been taking medications consistently.  He is not checking his blood pressure regularly at home.  He last saw cardiology about 16 months ago.  No current issues with chest pain.  Does have some exertional shortness of breath, decreased stamina. On exam, patient in no acute distress.  Cardiovascular exam with regular rate and rhythm. Patient does need to resume taking medication as prescribed.  He reports having medication available at home.  He last saw cardiology about 16 months ago and at that time was recommended for about a 1 month follow-up.  He is very overdue for follow-up and I recommend that he contact cardiology office to schedule follow-up appointment, contact information provided in office today.  He will need to have close follow-up with [place] and with specialist regarding management of chronic medical conditions and in particular severely elevated blood pressure.

## 2022-08-06 NOTE — Assessment & Plan Note (Signed)
Patient reports that fasting blood sugars are in the low 100s.  He reports continuing with glimepiride as well as long-acting insulin, insulin glargine.  He is in need of pen needles for insulin administration.  Denies any symptoms of hyper or hypoglycemia.  Has not had recent hemoglobin A1c check.  He has questions about possible CGM device. Will check labs as below We did discuss that typically CGM device will be utilized if having severe hypoglycemic episodes or needing multiple administrations of insulin throughout the day.  He does not necessarily meet these requirements.  He would be interested in referral to endocrinologist to further discuss.  Will provide referral for patient to establish with endocrinologist. Prescription for pen needles sent to pharmacy on file.

## 2022-08-06 NOTE — Telephone Encounter (Signed)
Timothy [name] with [facility] is calling with a Critical Lab   Calcium is 6.0

## 2022-08-08 NOTE — Progress Notes (Deleted)
Cardiology Office Note:    Date:  08/08/2022   ID:  Timothy [name], DOB 03-Oct-1970, MRN [id number]  PCP:  [name], [name], MD  Cardiologist:  Little Ishikawa, MD  Electrophysiologist:  None   Referring MD: [name], [name], MD   No chief complaint on file.    History of Present Illness:    Timothy [name] is a 52 y.o. male with a hx of chronic diastolic heart failure, hypertension, T2DM who presents as a follow-up for heart failure.  He was admitted from 5/19 through 06/24/2020 with pneumonia/acute on chronic diastolic heart failure.  Presented with fever to 101.6, SPO2 86% on room air, creatinine 2.71, WBC 12.2.  Chest x-ray showed multifocal pneumonia with concern for hypersensitivity pneumonitis versus pulmonary edema.  He was treated with IV antibiotics and Lasix.  COVID-19 was negative.  He was diuresed with IV Lasix 40 mg twice daily and discharged on p.o. Lasix 40 mg daily.  Creatinine 2.73 on discharge.  Echocardiogram 06/19/2019 showed normal biventricular function, severe LVH, mild MR/AI.  Echocardiogram 06/22/2020 showed normal biventricular function, indeterminate diastolic function, moderate LVH, RVSP 25, RAP 3, no significant valvular disease.  Since last clinic visit, he reports that he had sudden onset of shortness of breath this morning.  He denies any chest pain. Denies any headaches, lightheadedness, syncope, lower extremity edema, or palpitations.  Took his BP meds this morning. Not checking BP at home. Has appointment with nephrology later this month.   Wt Readings from Last 3 Encounters:  08/05/22 172 lb 6.4 oz (78.2 kg)  11/05/21 182 lb (82.6 kg)  07/31/21 182 lb 9.6 oz (82.8 kg)     Past Medical History:  Diagnosis Date   CHF (congestive heart failure) (HCC)    Chronic kidney disease    stage 4   Diabetes mellitus without complication (HCC)    Hypertension     Past Surgical History:  Procedure Laterality Date   EYE SURGERY       Current Medications: No outpatient medications have been marked as taking for the 08/09/22 encounter (Appointment) with Little Ishikawa, MD.     Allergies:   Patient has no known allergies.   Social History   Socioeconomic History   Marital status: Single    Spouse name: Not on file   Number of children: Not on file   Years of education: Not on file   Highest education level: Not on file  Occupational History   Not on file  Tobacco Use   Smoking status: Never   Smokeless tobacco: Never  Vaping Use   Vaping Use: Never used  Substance and Sexual Activity   Alcohol use: Not Currently   Drug use: Not Currently   Sexual activity: Not on file  Other Topics Concern   Not on file  Social History Narrative   Not on file   Social Determinants of Health   Financial Resource Strain: Not on file  Food Insecurity: Not on file  Transportation Needs: Not on file  Physical Activity: Not on file  Stress: Not on file  Social Connections: Not on file     Family History: The patient's family history is not on file.  ROS:   Please see the history of present illness.    All other systems reviewed and are negative.  EKGs/Labs/Other Studies Reviewed:    The following studies were reviewed today:   EKG:   04/12/21: Normal sinus rhythm, rate 80, LVH,  QTc 470 07/05/2020- The EKG ordered today demonstrates Sinus rhythm, rate 79, LVH with repolarization changes, T wave inversions in leads 1 and 2, AVF  Recent Labs: 08/05/2022: ALT 10; BUN 76; Creatinine, Ser 11.35; Hemoglobin 7.5; Platelets 297; Potassium 5.1; Sodium 139  Recent Lipid Panel    Component Value Date/Time   CHOL 204 (H) 10/12/2020 0930   TRIG 84 10/12/2020 0930   HDL 42 10/12/2020 0930   CHOLHDL 4.9 10/12/2020 0930   CHOLHDL 6.4 06/22/2020 1410   VLDL 19 06/22/2020 1410   LDLCALC 147 (H) 10/12/2020 0930    Physical Exam:    VS:  There were no vitals taken for this visit.    Wt Readings from Last 3  Encounters:  08/05/22 172 lb 6.4 oz (78.2 kg)  11/05/21 182 lb (82.6 kg)  07/31/21 182 lb 9.6 oz (82.8 kg)     GEN: Well nourished, well developed in no acute distress HEENT: Normal NECK: No JVD; No carotid bruits LYMPHATICS: crackles at lung bases, left greater than right  CARDIAC: RRR, no murmurs, rubs, gallops RESPIRATORY:  Clear to auscultation without rales, wheezing or rhonchi  ABDOMEN: Soft, non-tender, non-distended MUSCULOSKELETAL:  Trace lower extremity edema; No deformity  SKIN: Warm and dry NEUROLOGIC:  Alert and oriented x 3 PSYCHIATRIC:  Normal affect   ASSESSMENT:    No diagnosis found.    PLAN:    Hypertension: Medications are listed as Entresto 49-51 mg twice daily, amlodipine 10 mg daily, Imdur 30 mg daily, labetalol 400 mg twice daily, Toprol-XL 50 mg daily.  Unclear if actually taking 2 beta-blockers.  Chronic diastolic heart failure: Echocardiogram 06/22/2020 showed normal biventricular function, indeterminate diastolic function, moderate LVH, RVSP 25, RAP 3, no significant valvular disease.  On Lasix 80 mg  Hyperlipidemia: LDL 137 on 06/22/2020, started on atorvastatin 40 mg daily.    ESRD: Creatinine 11.35 on 08/05/2022, this is increased from 5.0 about 1 year ago.  He needs to be started on dialysis.***   T2DM: A1c 8.1 on 10/10/2020.  On insulin.  A1c improved to 5.8% 08/05/2022   RTC in 1 month***    Medication Adjustments/Labs and Tests Ordered: Current medicines are reviewed at length with the patient today.  Concerns regarding medicines are outlined above.  No orders of the defined types were placed in this encounter.   No orders of the defined types were placed in this encounter.    There are no Patient Instructions on file for this visit.     Signed, Little Ishikawa, MD  08/08/2022 4:38 PM    [facility]

## 2022-08-09 ENCOUNTER — Ambulatory Visit (HOSPITAL_BASED_OUTPATIENT_CLINIC_OR_DEPARTMENT_OTHER): Payer: Medicaid Other | Admitting: Cardiology

## 2022-08-11 NOTE — Progress Notes (Signed)
Cardiology Clinic Note   Patient Name: Lloyde Dechene II Date of Encounter: 08/12/2022  Primary Care Provider:  [name], Buren Kos, MD Primary Cardiologist:  Little Ishikawa, MD  Patient Profile    Karanvir Levins II 52 year old male presents to the clinic today for follow-up evaluation of his chronic diastolic CHF and hypertension.  Past Medical History    Past Medical History:  Diagnosis Date   CHF (congestive heart failure) (HCC)    Chronic kidney disease    stage 4   Diabetes mellitus without complication (HCC)    Hypertension    Past Surgical History:  Procedure Laterality Date   EYE SURGERY      Allergies  No Known Allergies  History of Present Illness    Jim Mynhier II has a PMH of chronic diastolic CHF, hypertensive urgency, acute respiratory failure, pneumonia, hematemesis, type 2 diabetes, CKD stage IV, elevated troponin, and positive D-dimer.  Echocardiogram 5/21 showed normal BiV function, severe LVH, mild MR/AI.  His echocardiogram 5/22 showed normal biventricular function and intermediate diastolic function, moderate LVH and no significant valvular abnormalities.  He was admitted to the hospital 5/19 until 06/24/2020 with pneumonia and acute chronic diastolic CHF.  He presented with a fever, oxygen saturation of 86% on room air, creatinine 2.71, WBCs 12.2.  His chest x-ray showed multi focal pneumonia with concern for hypertension pneumonitis versus pulmonary edema.  He received IV antibiotics and diuresis.  His COVID was negative.  He was transition to p.o. furosemide and his creatinine on discharge was 2.73.  He was seen in follow-up by Dr. [name] on 04/12/2021.  During that time he reported he had an episode of sudden onset of shortness of breath that morning.  He denied chest pain.  He denied headaches.  He denied lightheadedness and syncope.  He had no lower extremity edema and denied palpitations.  He reported compliance with his blood  pressure medications.  He had not been checking his blood pressure at home.  He reported having an appointment with neurology later that month.  His blood pressure was noted to be 215/112.  Due to his sudden onset of shortness of breath it was recommended that he be seen in the emergency department.  He declined EMS but agreed to have his girlfriend take him to the emergency department.  He presents to the clinic today for follow-up evaluation and states he feels well.  He does occasionally notice cramps in his calves.  His blood pressure today is significantly improved at 156/72.  We reviewed his medications and secondary causes for hypertension.  I will increase his metoprolol to 100 mg daily.  He reports that he does not take labetalol because it makes him feel sick.  I will check a magnesium level, instructed him that it is okay to take an extra 50 mg of hydralazine for systolic blood pressure greater than 160, will refer to advanced hypertension clinic, and plan follow-up with Dr. [name] or me in 3 months.  We will repeat his echocardiogram as well.  Today he denies chest pain, shortness of breath, lower extremity edema, fatigue, palpitations, melena, hematuria, hemoptysis, diaphoresis, weakness, presyncope, syncope, orthopnea, and PND.   Home Medications    Prior to Admission medications   Medication Sig Start Date End Date Taking? Authorizing Provider  amLODipine (NORVASC) 10 MG tablet Take 10 mg by mouth daily. 10/21/20   [provider]  atorvastatin (LIPITOR) 40 MG tablet Take 40 mg by mouth  daily.    [provider]  ENTRESTO 49-51 MG Take 1 tablet by mouth 2 (two) times daily. 01/09/22   [provider]  FARXIGA 10 MG TABS tablet Take 10 mg by mouth daily. 01/09/22   [provider]  furosemide (LASIX) 80 MG tablet Take 80 mg by mouth daily.    [provider]  glimepiride (AMARYL) 4 MG tablet Take 4 mg by mouth daily. 01/12/21   [provider]  hydrALAZINE (APRESOLINE) 100 MG tablet Take 50 mg by mouth 3 (three) times daily. 11/09/20   [provider]  hydrOXYzine (VISTARIL) 25 MG capsule Take 25 mg by mouth at bedtime. 11/01/20   [provider]  insulin glargine (LANTUS SOLOSTAR) 100 UNIT/ML Solostar Pen INJECT 5 UINTS INTO THE SKIN DAILY 01/30/22   [name], Buren Kos, MD  Insulin Pen Needle (PEN NEEDLES) 31G X 5 MM MISC 1 each by Does not apply route daily. 08/05/22   [name], [name], MD  isosorbide mononitrate (IMDUR) 30 MG 24 hr tablet Take 30 mg by mouth daily.    [provider]  labetalol (NORMODYNE) 200 MG tablet Take 400 mg by mouth 2 (two) times daily. 12/12/21   [provider]  levocetirizine (XYZAL) 5 MG tablet Take 5 mg by mouth every evening. 10/24/20   [provider]  meloxicam (MOBIC) 15 MG tablet Take 15 mg by mouth daily. 01/12/21   [provider]  metoprolol succinate (TOPROL-XL) 50 MG 24 hr tablet Take 50 mg by mouth daily. 11/02/20   [provider]  omeprazole (PRILOSEC) 40 MG capsule Take 40 mg by mouth daily. 01/12/21   [provider]    Family History    History reviewed. No pertinent family history. has no family status information on file.   Social History    Social History   Socioeconomic History   Marital status: Single    Spouse name: Not on file   Number of children: Not on file   Years of education: Not on file   Highest education level: Not on file  Occupational History   Not on file  Tobacco Use   Smoking status: Never   Smokeless tobacco: Never  Vaping Use   Vaping Use: Never used  Substance and Sexual Activity   Alcohol use: Not Currently   Drug use: Not Currently   Sexual activity: Not on file  Other Topics Concern   Not on file  Social History Narrative   Not on file   Social Determinants of Health   Financial Resource Strain: Not on file  Food Insecurity: Not on file  Transportation  Needs: Not on file  Physical Activity: Not on file  Stress: Not on file  Social Connections: Not on file  Intimate Partner Violence: Not on file     Review of Systems    General:  No chills, fever, night sweats or weight changes.  Cardiovascular:  No chest pain, dyspnea on exertion, edema, orthopnea, palpitations, paroxysmal nocturnal dyspnea. Dermatological: No rash, lesions/masses Respiratory: No cough, dyspnea Urologic: No hematuria, dysuria Abdominal:   No nausea, vomiting, diarrhea, bright red blood per rectum, melena, or hematemesis Neurologic:  No visual changes, wkns, changes in mental status. All other systems reviewed and are otherwise negative except as noted above.  Physical Exam    VS:  BP (!) 156/72   Pulse 83   Ht 5\' 5"  (1.651 m)   Wt 170 lb 12.8 oz (77.5 kg)  SpO2 100%   BMI 28.42 kg/m  , BMI Body mass index is 28.42 kg/m. GEN: Well nourished, well developed, in no acute distress. HEENT: normal. Neck: Supple, no JVD, carotid bruits, or masses. Cardiac: RRR, no murmurs, rubs, or gallops. No clubbing, cyanosis, edema.  Radials/DP/PT 2+ and equal bilaterally.  Respiratory:  Respirations regular and unlabored, clear to auscultation bilaterally. GI: Soft, nontender, nondistended, BS + x 4. MS: no deformity or atrophy. Skin: warm and dry, no rash. Neuro:  Strength and sensation are intact. Psych: Normal affect.  Accessory Clinical Findings    Recent Labs: 08/05/2022: ALT 10; BUN 76; Creatinine, Ser 11.35; Hemoglobin 7.5; Platelets 297; Potassium 5.1; Sodium 139   Recent Lipid Panel    Component Value Date/Time   CHOL 204 (H) 10/12/2020 0930   TRIG 84 10/12/2020 0930   HDL 42 10/12/2020 0930   CHOLHDL 4.9 10/12/2020 0930   CHOLHDL 6.4 06/22/2020 1410   VLDL 19 06/22/2020 1410   LDLCALC 147 (H) 10/12/2020 0930    HYPERTENSION CONTROL Vitals:   08/12/22 0813 08/12/22 0912  BP: (!) 156/72 (!) 156/72    The patient's blood pressure is elevated above  target today.  In order to address the patient's elevated BP: Blood pressure will be monitored at home to determine if medication changes need to be made.; A current anti-hypertensive medication was adjusted today.; A referral to the Advanced Hypertension Clinic will be placed.       ECG personally reviewed by me today-normal sinus rhythm minimal voltage criteria for LVH T wave abnormality 83 bpm  Echocardiogram 06/22/2020   IMPRESSIONS     1. Left ventricular ejection fraction, by estimation, is 60 to 65%. The  left ventricle has normal function. The left ventricle has no regional  wall motion abnormalities. There is moderate left ventricular hypertrophy.  Left ventricular diastolic  parameters are indeterminate.   2. Right ventricular systolic function is normal. The right ventricular  size is normal. There is normal pulmonary artery systolic pressure. The  estimated right ventricular systolic pressure is 25.1 mmHg.   3. Left atrial size was mildly dilated.   4. The mitral valve is normal in structure. Trivial mitral valve  regurgitation. No evidence of mitral stenosis.   5. The aortic valve is tricuspid. Aortic valve regurgitation is trivial.  No aortic stenosis is present.   6. The inferior vena cava is normal in size with greater than 50%  respiratory variability, suggesting right atrial pressure of 3 mmHg.   FINDINGS   Left Ventricle: Left ventricular ejection fraction, by estimation, is 60  to 65%. The left ventricle has normal function. The left ventricle has no  regional wall motion abnormalities. The left ventricular internal cavity  size was normal in size. There is   moderate left ventricular hypertrophy. Left ventricular diastolic  parameters are indeterminate.   Right Ventricle: The right ventricular size is normal. No increase in  right ventricular wall thickness. Right ventricular systolic function is  normal. There is normal pulmonary artery systolic  pressure. The tricuspid  regurgitant velocity is 2.35 m/s, and   with an assumed right atrial pressure of 3 mmHg, the estimated right  ventricular systolic pressure is 25.1 mmHg.   Left Atrium: Left atrial size was mildly dilated.   Right Atrium: Right atrial size was normal in size.   Pericardium: There is no evidence of pericardial effusion.   Mitral Valve: The mitral valve is normal in structure. Trivial mitral  valve regurgitation. No evidence of  mitral valve stenosis.   Tricuspid Valve: The tricuspid valve is normal in structure. Tricuspid  valve regurgitation is trivial. No evidence of tricuspid stenosis.   Aortic Valve: The aortic valve is tricuspid. Aortic valve regurgitation is  trivial. No aortic stenosis is present.   Pulmonic Valve: The pulmonic valve was normal in structure. Pulmonic valve  regurgitation is trivial. No evidence of pulmonic stenosis.   Aorta: The aortic root is normal in size and structure.   Venous: The inferior vena cava is normal in size with greater than 50%  respiratory variability, suggesting right atrial pressure of 3 mmHg.   IAS/Shunts: The interatrial septum was not well visualized.     Assessment & Plan   1.  Resistant hypertension-BP today 156/72.  Blood pressures chronically elevated in the 190s-200s over 100s. Maintain blood pressure log Continue amlodipine, Entresto, furosemide, hydralazine, Imdur,  Increase  metoprolol to 100 mg daily May take an extra 50 mg of hydralazine as needed for systolic blood pressure greater than 160 Reviewed secondary causes of hypertension Refer to advanced hypertension clinic  Chronic diastolic CHF-recently with some exertional dyspnea and decreased endurance.  Echocardiogram 5/22 showed normal biventricular function, intermediate diastolic parameters and no significant valvular abnormality. Continue Entresto, metoprolol, hydralazine, Farxiga Heart healthy low-sodium diet Repeat  echocardiogram  Hyperlipidemia-LDL 147 on 10/12/20. Continue atorvastatin High-fiber diet Follows with PCP  CKD stage IV-creatinine 11.35 on 08/05/2022.  Baseline creatinine around 4.22 Following with nephrology  Type 2 diabetes-glucose 122 on 08/05/2022. Continue current medical therapy Follows with PCP  Lower extremity cramping-order magnesium level List of magnesium rich foods given  Disposition: Follow-up with advanced hypertension clinic within the next month and Dr. [name] in 2-3 months.   Thomasene Ripple. Dorotha Hirschi NP-C     08/12/2022, 9:13 AM [facility] [address] Office [phone] Fax [phone]    I spent 14 minutes examining this patient, reviewing medications, and using patient centered shared decision making involving her cardiac care.  Prior to her visit I spent greater than 20 minutes reviewing her past medical history,  medications, and prior cardiac tests.

## 2022-08-12 ENCOUNTER — Ambulatory Visit: Payer: Medicaid Other | Attending: Cardiology | Admitting: General Practice

## 2022-08-12 ENCOUNTER — Encounter: Payer: Self-pay | Admitting: General Practice

## 2022-08-12 VITALS — BP 156/72 | HR 83 | Ht 65.0 in | Wt 170.8 lb

## 2022-08-12 DIAGNOSIS — E785 Hyperlipidemia, unspecified: Secondary | ICD-10-CM

## 2022-08-12 DIAGNOSIS — I10 Essential (primary) hypertension: Secondary | ICD-10-CM | POA: Diagnosis not present

## 2022-08-12 DIAGNOSIS — I5032 Chronic diastolic (congestive) heart failure: Secondary | ICD-10-CM

## 2022-08-12 DIAGNOSIS — N184 Chronic kidney disease, stage 4 (severe): Secondary | ICD-10-CM | POA: Diagnosis not present

## 2022-08-12 LAB — MAGNESIUM: Magnesium: 1.3 mg/dL — ABNORMAL LOW (ref 1.6–2.3)

## 2022-08-12 MED ORDER — METOPROLOL SUCCINATE ER 100 MG PO TB24: 100.0000 mg | ORAL_TABLET | Freq: Every day | ORAL | 3 refills | Status: DC

## 2022-08-12 NOTE — Patient Instructions (Addendum)
Medication Instructions:  INCREASE METOPROLOL 100MG  DAILY MAY TAKE EXTRA HYDRALAZINE 50MG  FOR BLOOD PRESSURE>160 *If you need a refill on your cardiac medications before your next appointment, please call your pharmacy*  Lab Work: MAG TODAY If you have labs (blood work) drawn today and your tests are completely normal, you will receive your results only by:  MyChart Message (if you have MyChart) OR  A paper copy in the mail If you have any lab test that is abnormal or we need to change your treatment, we will call you to review the results.  Testing/Procedures: Echocardiogram - Your physician has requested that you have an echocardiogram. Echocardiography is a painless test that uses sound waves to create images of your heart. It provides your doctor with information about the size and shape of your heart and how well your heart's chambers and valves are working. This procedure takes approximately one hour. There are no restrictions for this procedure.    Follow-Up: At [facility], you and your health needs are our priority.  As part of our continuing mission to provide you with exceptional heart care, we have created designated Provider Care Teams.  These Care Teams include your primary Cardiologist (physician) and Advanced Practice Providers (APPs -  Physician Assistants and Nurse Practitioners) who all work together to provide you with the care you need, when you need it.  We recommend signing up for the patient portal called "MyChart".  Sign up information is provided on this After Visit Summary.  MyChart is used to connect with patients for Virtual Visits (Telemedicine).  Patients are able to view lab/test results, encounter notes, upcoming appointments, etc.  Non-urgent messages can be sent to your provider as well.   To learn more about what you can do with MyChart, go to [email or web address].    Your next appointment:   1 month(s)  Provider:   Chilton Si, MD or  Gillian Shields, NP and 3 MONTHS WITH DR [facility] OR Edd Fabian, FNP-C    Other Instructions TAKE AND LOG YOUR BLOOD PRESSURE  PLEASE INCREASE INTAKE OF INCREASED MAGNESIUM FOODS:   Seeds: Pumpkin (1 oz for 156 mg) or chia seeds (1oz 111 mg) Nuts: Almonds (1 oz for 80 mg) and cashews (1oz for 74 mg) Greens: Spinach (.5 cup boiled for 78 mg) Beans: Black beans (.5 cup cooked for 60 mg) Soy products: Soy milk (1 cup boiled for 61 mg) or edamame (.5 cup shelled for 50 mg)

## 2022-08-13 ENCOUNTER — Telehealth: Payer: Self-pay | Admitting: Cardiology

## 2022-08-13 DIAGNOSIS — I5032 Chronic diastolic (congestive) heart failure: Secondary | ICD-10-CM

## 2022-08-13 DIAGNOSIS — N184 Chronic kidney disease, stage 4 (severe): Secondary | ICD-10-CM

## 2022-08-13 NOTE — Telephone Encounter (Signed)
Spoke with patient.  He states understanding of the medication and OTC.  Will pick up and take 1 week.  Lab ordered and he will have done in 1-2 weeks

## 2022-08-13 NOTE — Telephone Encounter (Signed)
Patient is returning call in regards to lab results.

## 2022-08-14 LAB — LAB REPORT - SCANNED: EGFR: 5

## 2022-08-16 ENCOUNTER — Telehealth (HOSPITAL_COMMUNITY): Payer: Self-pay | Admitting: *Deleted

## 2022-08-16 DIAGNOSIS — E785 Hyperlipidemia, unspecified: Secondary | ICD-10-CM | POA: Insufficient documentation

## 2022-08-16 DIAGNOSIS — Z794 Long term (current) use of insulin: Secondary | ICD-10-CM | POA: Insufficient documentation

## 2022-08-16 DIAGNOSIS — Z992 Dependence on renal dialysis: Secondary | ICD-10-CM | POA: Insufficient documentation

## 2022-08-16 DIAGNOSIS — E559 Vitamin D deficiency, unspecified: Secondary | ICD-10-CM | POA: Insufficient documentation

## 2022-08-16 DIAGNOSIS — N2581 Secondary hyperparathyroidism of renal origin: Secondary | ICD-10-CM | POA: Insufficient documentation

## 2022-08-16 NOTE — Telephone Encounter (Signed)
Received urgent request via proficient from Dr Vallery Sa for access placement. Will give to [facility].

## 2022-08-19 ENCOUNTER — Other Ambulatory Visit: Payer: Self-pay

## 2022-08-19 DIAGNOSIS — N184 Chronic kidney disease, stage 4 (severe): Secondary | ICD-10-CM

## 2022-08-20 DIAGNOSIS — R52 Pain, unspecified: Secondary | ICD-10-CM | POA: Insufficient documentation

## 2022-08-20 DIAGNOSIS — R110 Nausea: Secondary | ICD-10-CM | POA: Insufficient documentation

## 2022-08-20 DIAGNOSIS — L299 Pruritus, unspecified: Secondary | ICD-10-CM | POA: Insufficient documentation

## 2022-08-20 DIAGNOSIS — T782XXA Anaphylactic shock, unspecified, initial encounter: Secondary | ICD-10-CM | POA: Insufficient documentation

## 2022-08-20 DIAGNOSIS — Z23 Encounter for immunization: Secondary | ICD-10-CM | POA: Insufficient documentation

## 2022-08-20 DIAGNOSIS — D689 Coagulation defect, unspecified: Secondary | ICD-10-CM | POA: Insufficient documentation

## 2022-08-20 DIAGNOSIS — R197 Diarrhea, unspecified: Secondary | ICD-10-CM | POA: Insufficient documentation

## 2022-08-20 DIAGNOSIS — T7840XA Allergy, unspecified, initial encounter: Secondary | ICD-10-CM | POA: Insufficient documentation

## 2022-08-20 DIAGNOSIS — N186 End stage renal disease: Secondary | ICD-10-CM | POA: Insufficient documentation

## 2022-08-20 DIAGNOSIS — T886XXA Anaphylactic reaction due to adverse effect of correct drug or medicament properly administered, initial encounter: Secondary | ICD-10-CM | POA: Insufficient documentation

## 2022-08-20 DIAGNOSIS — R509 Fever, unspecified: Secondary | ICD-10-CM | POA: Insufficient documentation

## 2022-08-20 DIAGNOSIS — D631 Anemia in chronic kidney disease: Secondary | ICD-10-CM | POA: Insufficient documentation

## 2022-08-20 DIAGNOSIS — Z111 Encounter for screening for respiratory tuberculosis: Secondary | ICD-10-CM | POA: Insufficient documentation

## 2022-08-21 ENCOUNTER — Other Ambulatory Visit: Payer: Self-pay

## 2022-08-21 ENCOUNTER — Emergency Department (HOSPITAL_COMMUNITY)
Admission: EM | Admit: 2022-08-21 | Discharge: 2022-08-21 | Disposition: A | Discharge status: Home / Self Care | Payer: Medicaid Other | Attending: Emergency Medicine | Admitting: Emergency Medicine

## 2022-08-21 DIAGNOSIS — Y732 Prosthetic and other implants, materials and accessory gastroenterology and urology devices associated with adverse incidents: Secondary | ICD-10-CM | POA: Insufficient documentation

## 2022-08-21 DIAGNOSIS — I509 Heart failure, unspecified: Secondary | ICD-10-CM | POA: Insufficient documentation

## 2022-08-21 DIAGNOSIS — E119 Type 2 diabetes mellitus without complications: Secondary | ICD-10-CM | POA: Insufficient documentation

## 2022-08-21 DIAGNOSIS — I110 Hypertensive heart disease with heart failure: Secondary | ICD-10-CM | POA: Insufficient documentation

## 2022-08-21 DIAGNOSIS — T82838A Hemorrhage of vascular prosthetic devices, implants and grafts, initial encounter: Secondary | ICD-10-CM | POA: Insufficient documentation

## 2022-08-21 LAB — BASIC METABOLIC PANEL
Anion gap: 9 (ref 5–15)
BUN: 58 mg/dL — ABNORMAL HIGH (ref 6–20)
CO2: 19 mmol/L — ABNORMAL LOW (ref 22–32)
Calcium: 6.6 mg/dL — ABNORMAL LOW (ref 8.9–10.3)
Chloride: 105 mmol/L (ref 98–111)
Creatinine, Ser: 9.49 mg/dL — ABNORMAL HIGH (ref 0.61–1.24)
GFR, Estimated: 6 mL/min — ABNORMAL LOW (ref >60)
Glucose, Bld: 101 mg/dL — ABNORMAL HIGH (ref 70–99)
Potassium: 3.3 mmol/L — ABNORMAL LOW (ref 3.5–5.1)
Sodium: 133 mmol/L — ABNORMAL LOW (ref 135–145)

## 2022-08-21 LAB — PHOSPHORUS: Phosphorus: 4.4 mg/dL (ref 2.5–4.6)

## 2022-08-21 LAB — CBC
HCT: 21.9 % — ABNORMAL LOW (ref 39.0–52.0)
Hemoglobin: 7.1 g/dL — ABNORMAL LOW (ref 13.0–17.0)
MCH: 25.4 pg — ABNORMAL LOW (ref 26.0–34.0)
MCHC: 32.4 g/dL (ref 30.0–36.0)
MCV: 78.2 fL — ABNORMAL LOW (ref 80.0–100.0)
Platelets: 296 10*3/uL (ref 150–400)
RBC: 2.8 MIL/uL — ABNORMAL LOW (ref 4.22–5.81)
RDW: 17 % — ABNORMAL HIGH (ref 11.5–15.5)
WBC: 5.3 10*3/uL (ref 4.0–10.5)
nRBC: 0 % (ref 0.0–0.2)

## 2022-08-21 LAB — TYPE AND SCREEN
ABO/RH(D): B POS
Antibody Screen: NEGATIVE

## 2022-08-21 LAB — CBC WITH DIFFERENTIAL/PLATELET
Abs Immature Granulocytes: 0.01 10*3/uL (ref 0.00–0.07)
Basophils Absolute: 0 10*3/uL (ref 0.0–0.1)
Basophils Relative: 1 %
Eosinophils Absolute: 0.5 10*3/uL (ref 0.0–0.5)
Eosinophils Relative: 9 %
HCT: 21.3 % — ABNORMAL LOW (ref 39.0–52.0)
Hemoglobin: 7.1 g/dL — ABNORMAL LOW (ref 13.0–17.0)
Immature Granulocytes: 0 %
Lymphocytes Relative: 11 %
Lymphs Abs: 0.6 10*3/uL — ABNORMAL LOW (ref 0.7–4.0)
MCH: 26.1 pg (ref 26.0–34.0)
MCHC: 33.3 g/dL (ref 30.0–36.0)
MCV: 78.3 fL — ABNORMAL LOW (ref 80.0–100.0)
Monocytes Absolute: 0.7 10*3/uL (ref 0.1–1.0)
Monocytes Relative: 13 %
Neutro Abs: 3.7 10*3/uL (ref 1.7–7.7)
Neutrophils Relative %: 66 %
Platelets: 293 10*3/uL (ref 150–400)
RBC: 2.72 MIL/uL — ABNORMAL LOW (ref 4.22–5.81)
RDW: 17 % — ABNORMAL HIGH (ref 11.5–15.5)
WBC: 5.5 10*3/uL (ref 4.0–10.5)
nRBC: 0 % (ref 0.0–0.2)

## 2022-08-21 LAB — MAGNESIUM: Magnesium: 1.6 mg/dL — ABNORMAL LOW (ref 1.7–2.4)

## 2022-08-21 MED ORDER — ACETAMINOPHEN 500 MG PO TABS
1000.0000 mg | ORAL_TABLET | Freq: Once | ORAL | Status: AC
  Administered 2022-08-21: 1000 mg via ORAL
  Filled 2022-08-21: qty 2

## 2022-08-21 MED ORDER — MAGNESIUM SULFATE 2 GM/50ML IV SOLN
2.0000 g | Freq: Once | INTRAVENOUS | Status: AC
  Administered 2022-08-21: 2 g via INTRAVENOUS
  Filled 2022-08-21: qty 50

## 2022-08-21 NOTE — ED Provider Notes (Signed)
[place] EMERGENCY DEPARTMENT AT [facility] Provider Note   CSN: [id number] Arrival date & time: 08/21/22  1716     History  Chief Complaint  Patient presents with   Dizziness   Vascular Access Problem    Timothy [name] is a 52 y.o. male history of CHF, hypertension, diabetes, on dialysis presented for bleeding around his access site.  Patient states he had access site placed in his right chest earlier this week and he went to get dialysis today he noticed that it started to bleed.  Patient states he threw a suture and told him he would BRI it however when he got home he noticed that he was still bleeding and came to the ER.  Patient states only looked down to look at the bleeding he became dizzy for seconds in which he felt unstable however that is resolved.  Patient denies any blood thinners.  Patient denies new onset weakness, fevers, changes sensation/motor skills  Home Medications Prior to Admission medications   Medication Sig Start Date End Date Taking? Authorizing Provider  albuterol (VENTOLIN HFA) 108 (90 Base) MCG/ACT inhaler Inhale 2 puffs into the lungs every 6 (six) hours as needed. 05/21/22   [provider]  amLODipine (NORVASC) 10 MG tablet Take 10 mg by mouth daily. 10/21/20   [provider]  atorvastatin (LIPITOR) 40 MG tablet Take 40 mg by mouth daily.    [provider]  carvedilol (COREG) 25 MG tablet Take 25 mg by mouth 2 (two) times daily. 06/20/22   [provider]  Continuous Glucose Receiver (FREESTYLE LIBRE 3 READER) DEVI See admin instructions. 06/14/22   [provider]  Continuous Glucose Sensor (FREESTYLE LIBRE 3 SENSOR) MISC See admin instructions. 08/07/22   [provider]  doxycycline (VIBRA-TABS) 100 MG tablet Take 100 mg by mouth 2 (two) times daily. 06/25/22   [provider]  DULERA 100-5 MCG/ACT AERO Inhale 2 puffs into the lungs daily. 06/14/22   [provider]   ENTRESTO 49-51 MG Take 1 tablet by mouth 2 (two) times daily. Patient not taking: Reported on 08/12/2022 01/09/22   [provider]  FARXIGA 10 MG TABS tablet Take 10 mg by mouth daily. 01/09/22   [provider]  furosemide (LASIX) 80 MG tablet Take 80 mg by mouth daily.    [provider]  glimepiride (AMARYL) 4 MG tablet Take 4 mg by mouth daily. 01/12/21   [provider]  hydrALAZINE (APRESOLINE) 100 MG tablet Take 50 mg by mouth 3 (three) times daily. MAY TAKE EXTRA 50MG  FOR SBP>160 11/09/20   [provider]  hydrOXYzine (VISTARIL) 25 MG capsule Take 25 mg by mouth at bedtime. 11/01/20   [provider]  insulin glargine (LANTUS SOLOSTAR) 100 UNIT/ML Solostar Pen INJECT 5 UINTS INTO THE SKIN DAILY 01/30/22   [name], Buren Kos, MD  Insulin Pen Needle (PEN NEEDLES) 31G X 5 MM MISC 1 each by Does not apply route daily. 08/05/22   [name], [name], MD  isosorbide mononitrate (IMDUR) 30 MG 24 hr tablet Take 30 mg by mouth daily.    [provider]  levocetirizine (XYZAL) 5 MG tablet Take 5 mg by mouth every evening. 10/24/20   [provider]  losartan (COZAAR) 25 MG tablet Take 25 mg by mouth daily. 09/21/20   [provider]  meloxicam (MOBIC) 15 MG tablet Take 15 mg by mouth daily. 01/12/21   [provider]  metolazone (  ZAROXOLYN) 2.5 MG tablet Take 2.5 mg by mouth daily. 05/29/22   [provider]  metoprolol succinate (TOPROL-XL) 100 MG 24 hr tablet Take 1 tablet (100 mg total) by mouth daily. 08/12/22   Ronney Asters, NP  omeprazole (PRILOSEC) 40 MG capsule Take 40 mg by mouth daily. 01/12/21   [provider]  OZEMPIC, 0.25 OR 0.5 MG/DOSE, 2 MG/3ML SOPN Inject into the skin. 06/25/22   [provider]  spironolactone (ALDACTONE) 25 MG tablet Take 25 mg by mouth daily. 06/18/22   [provider]  torsemide (DEMADEX) 20 MG tablet Take 20 mg by mouth 2 (two) times daily.  06/21/22   [provider]      Allergies    Patient has no known allergies.    Review of Systems   Review of Systems  Neurological:  Positive for dizziness.    Physical Exam Updated Vital Signs BP (!) 189/89   Pulse 78   Temp 98.7 F (37.1 C) (Oral)   Resp 18   Ht 5\' 6"  (1.676 m)   Wt 77.1 kg   SpO2 99%   BMI 27.44 kg/m  Physical Exam Constitutional:      General: He is not in acute distress. Cardiovascular:     Rate and Rhythm: Normal rate and regular rhythm.     Pulses: Normal pulses.     Heart sounds: Normal heart sounds.  Pulmonary:     Effort: Pulmonary effort is normal. No respiratory distress.     Breath sounds: Normal breath sounds.  Abdominal:     Palpations: Abdomen is soft.     Tenderness: There is no abdominal tenderness. There is no guarding or rebound.  Skin:    General: Skin is warm and dry.     Capillary Refill: Capillary refill takes less than 2 seconds.     Comments: Vascular site on right side of chest: Continuous pulsatile ooze noted at site, no surrounding skin color changes or purulent discharge  Neurological:     General: No focal deficit present.     Mental Status: He is alert.  Psychiatric:        Mood and Affect: Mood normal.     ED Results / Procedures / Treatments   Labs (all labs ordered are listed, but only abnormal results are displayed) Labs Reviewed  CBC WITH DIFFERENTIAL/PLATELET - Abnormal; Notable for the following components:      Result Value   RBC 2.72 (*)    Hemoglobin 7.1 (*)    HCT 21.3 (*)    MCV 78.3 (*)    RDW 17.0 (*)    Lymphs Abs 0.6 (*)    All other components within normal limits  BASIC METABOLIC PANEL - Abnormal; Notable for the following components:   Sodium 133 (*)    Potassium 3.3 (*)    CO2 19 (*)    Glucose, Bld 101 (*)    BUN 58 (*)    Creatinine, Ser 9.49 (*)    Calcium 6.6 (*)    GFR, Estimated 6 (*)    All other components within normal limits  MAGNESIUM - Abnormal; Notable for  the following components:   Magnesium 1.6 (*)    All other components within normal limits  CBC - Abnormal; Notable for the following components:   RBC 2.80 (*)    Hemoglobin 7.1 (*)    HCT 21.9 (*)    MCV 78.2 (*)    MCH 25.4 (*)    RDW  17.0 (*)    All other components within normal limits  PHOSPHORUS  TYPE AND SCREEN    EKG None  Radiology No results found.  Procedures Procedures    Medications Ordered in ED Medications  acetaminophen (TYLENOL) tablet 1,000 mg (1,000 mg Oral Given 08/21/22 1909)  magnesium sulfate IVPB 2 g 50 mL (0 g Intravenous Stopped 08/21/22 2141)    ED Course/ Medical Decision Making/ A&P                             Medical Decision Making  Daymeon Malarkey [name] 52 y.o. presented today for bleeding at vascular access. Working DDx that I considered at this time includes, but not limited to, anemia, life-threatening hemorrhage, electrolyte abnormalities, cellulitis, infected vascular site.  R/o DDx: life-threatening hemorrhage, electrolyte abnormalities, cellulitis, infected vascular site: These are considered less likely due to history of present illness and physical exam findings  Review of prior external notes: 08/12/2022 office visit  Unique Tests and My Interpretation:  CBC: Hemoglobin 7.1, 7.1 Type and screen: Phosphorus: Unremarkable Magnesium: Decreased 1.6 BMP: At baseline  Discussion with Independent Historian:  Daughter  Discussion of Management of Tests:  Elpidio Anis, MD Vascular ; Valentino Nose, MD Nephro  Risk: Medium: prescription drug management  Risk Stratification Score: None  Staffed with Pfeiffer, MD  Plan: On exam patient was in no acute distress stable vitals.  Patient was noted be playing in triage and had gauze on his vascular side.  The gauze was removed and there appears to be no active hemorrhage however it does appear to be slight leakage with continuous pulsatile close around the insertion of vascular site area.   Patient denies any blood thinners and is neurovascularly intact and denying any current chest pain or shortness of breath.  I did not note any skin color changes.  Access site is not a AV fistula but a central venous catheter.  I spoke to the vascular specialist on-call who states that since this was done at [facility] to call them.  It then called [facility] who stated that he is not currently skilled and fixing the vascular access site and recommends a pressure dressing and if that does not work to call the vascular specialist again.  Pressure dressing was ordered along with basic labs and type and screen.  Patient stable this time.  With the help of Dr. Donnald Garre combat gauze was placed and wound appears to be hemostatically stable.  Repeat CBC shows patient has stabilized hemoglobin. Vascular was consulted again and stated that if he is no longer bleeding he can be discharged and follow-up with [facility] is they are the ones that put in the vascular side.  On recheck patient was not bleeding an hour later.  As per the vascular specialist plan patient may be discharged.  Patient states he is comfortable being discharged to follow-up with [facility] tomorrow to have his vascular side rechecked.  Patient was given return precautions. Patient stable for discharge at this time.  Patient verbalized understanding of plan.         Final Clinical Impression(s) / ED Diagnoses Final diagnoses:  Hemorrhage due to vascular catheter, initial encounter [facility])    Rx / DC Orders ED Discharge Orders     None         Remi Deter 08/21/22 2203    Arby Barrette, MD 08/23/22 1138

## 2022-08-21 NOTE — ED Notes (Signed)
MD at bedside. 

## 2022-08-21 NOTE — ED Triage Notes (Signed)
Pt c/o dizziness x 1 hr. Pt states he had dialysis this morning and his dialysis access has been bleeding all day.

## 2022-08-21 NOTE — ED Provider Notes (Signed)
I provided a substantive portion of the care of this patient.  I personally made/approved the management plan for this patient and take responsibility for the patient management.     Patient has a tunneled venous catheter that was placed 4 days ago.  He was successfully dialyzed today but had bleeding around the catheter.  A hemostatic gauze and suture was placed at the dialysis site.  Patient went home and reports that he then had recurrence of bleeding.  With sterile glove I have examined the tunneled catheter.  It is a hemostatic gauze beneath it adjacent to the access point and silk suture that is securely holding the catheter down.  There is a continuous, pulsatile ooze.  From the catheter insertion site.  As the patient does have ongoing bleeding, will request peripheral access and monitor CBC and obtain type and screen.  Report he has been bleeding at least off-and-on most of the day.  Currently the patient is alert with good mental status and blood pressures and heart rate are normal.   With sterile gloves and sterile gauze, dressing from dialysis removed. There is a presumable hemostatic gauze securely sutured underneath the catheter with the catheter tethered with suture over it. With sterile technique, I cleaned loose clot from the area and left the catheter and hemostatic gauze in place, without removing suture, leaving the catheter sutured as originally done at dialysis. Slow leak around the catheter still present. Combat gauze carefully packed directly on skin insertion of catheter. Sterile 4x4 packs applied and pressure held for 5+ minutes. No bleeding through or around dressing. I improvised pressure dressing with ACE wraps. Patient tolerated well.    Arby Barrette, MD 08/23/22 1105

## 2022-08-21 NOTE — Discharge Instructions (Addendum)
[facility]he[facility]ed.  Today your hemoglobin/blood count was stable and we placed combat gauze on your access site.  It is very important they follow-up with your nephrologist for long-term management.  If symptoms change or worsen please return to ER.

## 2022-08-21 NOTE — ED Notes (Signed)
New sterile-dry dressing placed below access port, monitoring for any blood residual.

## 2022-08-22 ENCOUNTER — Telehealth (HOSPITAL_BASED_OUTPATIENT_CLINIC_OR_DEPARTMENT_OTHER): Payer: Self-pay | Admitting: *Deleted

## 2022-08-22 NOTE — Telephone Encounter (Signed)
LVM to offer colon cancer screening.

## 2022-08-26 ENCOUNTER — Telehealth: Payer: Self-pay

## 2022-08-26 NOTE — Telephone Encounter (Signed)
Spoke with patient regarding surgery scheduled on 8/8. Advised patient that Dr. Myra Gianotti is not available on this day. Patient agreed to reschedule surgery date for 8/15. Will fax an updated instructions letter to [facility]  dialysis center.

## 2022-08-30 DIAGNOSIS — D508 Other iron deficiency anemias: Secondary | ICD-10-CM | POA: Insufficient documentation

## 2022-09-02 ENCOUNTER — Ambulatory Visit (HOSPITAL_BASED_OUTPATIENT_CLINIC_OR_DEPARTMENT_OTHER): Payer: Medicaid Other | Admitting: Family Medicine

## 2022-09-06 DIAGNOSIS — K31811 Angiodysplasia of stomach and duodenum with bleeding: Secondary | ICD-10-CM | POA: Insufficient documentation

## 2022-09-09 ENCOUNTER — Ambulatory Visit (HOSPITAL_COMMUNITY): Payer: Medicaid Other | Attending: General Practice

## 2022-09-09 DIAGNOSIS — I5032 Chronic diastolic (congestive) heart failure: Secondary | ICD-10-CM

## 2022-09-09 LAB — ECHOCARDIOGRAM COMPLETE
Area-P 1/2: 4.71 cm2
S' Lateral: 2.9 cm

## 2022-09-13 NOTE — Progress Notes (Incomplete)
PCP - Dr. Marcy Salvo [name] Cardiologist - Epifanio Lesches  PPM/ICD - *** Device Orders - *** Rep Notified - ***  Chest x-ray - 07-08-21 EKG - ***08-12-22 Stress Test - *** ECHO - 09-09-22 Cardiac Cath - ***  CPAP - ***  GLP-1 -OZEMPIC, 0.25 OR 0.5 MG/DOSE, 2 MG/3ML SOPN   Fasting Blood Sugar - *** Checks Blood Sugar ***/day  Blood Thinner Instructions: *** Aspirin Instructions: ***  ERAS Protcol - ***  COVID TEST- ***  Anesthesia review: ***  Patient verbally denies any shortness of breath, fever, cough and chest pain during phone call   -------------  SDW INSTRUCTIONS given:  Your procedure is scheduled on August 15,2024.  Report to [facility] Main Entrance "A" at 7:00 A.M., and check in at the Admitting office.  Call this number if you have problems the morning of surgery:  [phone]   Remember:  Do not eat or drink after midnight the night before your surgery     Take these medicines the morning of surgery with A SIP OF WATER  amLODipine (NORVASC)  atorvastatin (LIPITOR)  carvedilol (COREG)  doxycycline (VIBRA-TABS) why taking DULERA  hydrALAZINE (APRESOLINE)    isosorbide mononitrate (IMDUR)  metoprolol succinate (TOPROL-XL)  omeprazole (PRILOSEC)    IF NEEDED albuterol (VENTOLIN HFA) 108 (90 Base)   DO NOT TAKE : OZEMPIC, 0.25 OR 0.5 MG/DOSE, 2 MG/3ML SOPN  09-11-22  As of today, STOP taking any Aspirin (unless otherwise instructed by your surgeon) Aleve, Naproxen, Ibuprofen, Motrin, Advil, Goody's, BC's, all herbal medications, fish oil, and all vitamins. meloxicam (MOBIC)  .how   ** PLEASE check your blood sugar the morning of your surgery when you wake up and every 2 hours until you get to the Short Stay unit. FARXIGA Hold for 3 days hold morning of surgery Last dose 09-16-22 glimepiride (AMARYL) Take only morning dose or evening dose evening before surgery Do NOT TAKE MORNING OF SURGERY    Continuous Glucose Sensor (FREESTYLE LIBRE 3 SENSOR)  insulin glargine (LANTUS SOLOSTAR) 100 UNIT/ML Solostar Pen  TAKE HALF NIGHT BEFORE 2.5 UNITS OR TAKE HALF MORNING OF SURGERY  If your blood sugar is less than 70 mg/dL, you will need to treat for low blood sugar: Do not take insulin. Treat a low blood sugar (less than 70 mg/dL) with  cup of clear juice (cranberry or apple), 4 glucose tablets, OR glucose gel. Recheck blood sugar in 15 minutes after treatment (to make sure it is greater than 70 mg/dL). If your blood sugar is not greater than 70 mg/dL on recheck, call [phone] for further instructions.                       Do not wear jewelry, make up, or nail polish            Do not wear lotions, powders, perfumes/colognes, or deodorant.            Do not shave 48 hours prior to surgery.  Men may shave face and neck.            Do not bring valuables to the hospital.            [facility] is not responsible for any belongings or valuables.  Do NOT Smoke (Tobacco/Vaping) 24 hours prior to your procedure If you use a CPAP at night, you may bring all equipment for your overnight stay.   Contacts, glasses, dentures or bridgework may not be worn into surgery.  For patients admitted to the hospital, discharge time will be determined by your treatment team.   Patients discharged the day of surgery will not be allowed to drive home, and someone needs to stay with them for 24 hours.    Special instructions:   [place]- Preparing For Surgery  Before surgery, you can play an important role. Because skin is not sterile, your skin needs to be as free of germs as possible. You can reduce the number of germs on your skin by washing with CHG (chlorahexidine gluconate) Soap before surgery.  CHG is an antiseptic cleaner which kills germs and bonds with the skin to continue killing germs even after washing.    Oral Hygiene is also important to reduce your risk of infection.  Remember - BRUSH YOUR TEETH THE MORNING OF SURGERY WITH YOUR  REGULAR TOOTHPASTE  Please do not use if you have an allergy to CHG or antibacterial soaps. If your skin becomes reddened/irritated stop using the CHG.  Do not shave (including legs and underarms) for at least 48 hours prior to first CHG shower. It is OK to shave your face.  Please follow these instructions carefully.   Shower the NIGHT BEFORE SURGERY and the MORNING OF SURGERY with DIAL Soap.   Pat yourself dry with a CLEAN TOWEL.  Wear CLEAN PAJAMAS to bed the night before surgery  Place CLEAN SHEETS on your bed the night of your first shower and DO NOT SLEEP WITH PETS.   Day of Surgery: Please shower morning of surgery  Wear Clean/Comfortable clothing the morning of surgery Do not apply any deodorants/lotions.   Remember to brush your teeth WITH YOUR REGULAR TOOTHPASTE.   Questions were answered. Patient verbalized understanding of instructions.

## 2022-09-17 ENCOUNTER — Other Ambulatory Visit: Payer: Self-pay

## 2022-09-17 ENCOUNTER — Encounter (HOSPITAL_COMMUNITY): Payer: Self-pay | Admitting: Surgery

## 2022-09-17 NOTE — Progress Notes (Signed)
Mr. Timothy [name] denies chest pain or shortness of breath.  Patient denies having any s/s of Covid in his household, also denies any known exposure to Covid. Mr. Timothy [name] denies  any s/s of upper or lower respiratory infection in the past 8 weeks.  Mr. Timothy [name] PCP is Dr. Marcy Salvo, cardiologist is Dr. Epifanio Lesches.  Mr. Timothy [name] has type II diabetes, last A1C was 5.8, drawn on 08/05/22. I instructed patient to check CBG after awaking and every 2 hours until arrival  to the hospital. I Instructed Mr. Timothy [name] if CBG is less than 70 to take 4 Glucose Tablets or 1 tube of Glucose Gel or 1/2 cup of a clear juice. Recheck CBG in 15 minutes if CBG is not over 70 call, pre- op desk at [phone] for further instructions.

## 2022-09-18 NOTE — Telephone Encounter (Signed)
Spoke with patient to update on arrival time for surgery on 8/15 at [facility] to now 0530 AM. Patient voiced understanding.

## 2022-09-19 ENCOUNTER — Other Ambulatory Visit: Payer: Self-pay

## 2022-09-19 ENCOUNTER — Ambulatory Visit (HOSPITAL_BASED_OUTPATIENT_CLINIC_OR_DEPARTMENT_OTHER): Payer: Medicaid Other | Admitting: Anesthesiology

## 2022-09-19 ENCOUNTER — Ambulatory Visit (HOSPITAL_COMMUNITY): Payer: Medicaid Other | Admitting: Anesthesiology

## 2022-09-19 ENCOUNTER — Encounter (HOSPITAL_COMMUNITY)
Admission: RE | Disposition: A | Discharge status: Home / Self Care | Payer: Self-pay | Source: Home / Self Care | Attending: Surgery

## 2022-09-19 ENCOUNTER — Encounter (HOSPITAL_COMMUNITY): Payer: Self-pay | Admitting: Surgery

## 2022-09-19 ENCOUNTER — Ambulatory Visit (HOSPITAL_COMMUNITY)
Admission: RE | Admit: 2022-09-19 | Discharge: 2022-09-19 | Disposition: A | Discharge status: Home / Self Care | Payer: Medicaid Other | Attending: Surgery | Admitting: Surgery

## 2022-09-19 DIAGNOSIS — I5032 Chronic diastolic (congestive) heart failure: Secondary | ICD-10-CM

## 2022-09-19 DIAGNOSIS — E1122 Type 2 diabetes mellitus with diabetic chronic kidney disease: Secondary | ICD-10-CM | POA: Diagnosis not present

## 2022-09-19 DIAGNOSIS — N186 End stage renal disease: Secondary | ICD-10-CM

## 2022-09-19 DIAGNOSIS — I132 Hypertensive heart and chronic kidney disease with heart failure and with stage 5 chronic kidney disease, or end stage renal disease: Secondary | ICD-10-CM

## 2022-09-19 DIAGNOSIS — Z992 Dependence on renal dialysis: Secondary | ICD-10-CM | POA: Insufficient documentation

## 2022-09-19 DIAGNOSIS — I272 Pulmonary hypertension, unspecified: Secondary | ICD-10-CM | POA: Diagnosis not present

## 2022-09-19 DIAGNOSIS — Z794 Long term (current) use of insulin: Secondary | ICD-10-CM | POA: Insufficient documentation

## 2022-09-19 DIAGNOSIS — E7800 Pure hypercholesterolemia, unspecified: Secondary | ICD-10-CM | POA: Diagnosis not present

## 2022-09-19 DIAGNOSIS — N184 Chronic kidney disease, stage 4 (severe): Secondary | ICD-10-CM

## 2022-09-19 DIAGNOSIS — I509 Heart failure, unspecified: Secondary | ICD-10-CM | POA: Insufficient documentation

## 2022-09-19 DIAGNOSIS — K219 Gastro-esophageal reflux disease without esophagitis: Secondary | ICD-10-CM | POA: Insufficient documentation

## 2022-09-19 DIAGNOSIS — N185 Chronic kidney disease, stage 5: Secondary | ICD-10-CM | POA: Diagnosis not present

## 2022-09-19 DIAGNOSIS — Z79899 Other long term (current) drug therapy: Secondary | ICD-10-CM | POA: Insufficient documentation

## 2022-09-19 DIAGNOSIS — D631 Anemia in chronic kidney disease: Secondary | ICD-10-CM | POA: Insufficient documentation

## 2022-09-19 HISTORY — DX: Anemia, unspecified: D64.9

## 2022-09-19 HISTORY — DX: Pneumonia, unspecified organism: J18.9

## 2022-09-19 HISTORY — PX: AV FISTULA PLACEMENT: SHX1204

## 2022-09-19 HISTORY — DX: Dyspnea, unspecified: R06.00

## 2022-09-19 LAB — POCT I-STAT, CHEM 8
BUN: 19 mg/dL (ref 6–20)
Calcium, Ion: 1.05 mmol/L — ABNORMAL LOW (ref 1.15–1.40)
Chloride: 99 mmol/L (ref 98–111)
Creatinine, Ser: 7 mg/dL — ABNORMAL HIGH (ref 0.61–1.24)
Glucose, Bld: 116 mg/dL — ABNORMAL HIGH (ref 70–99)
HCT: 27 % — ABNORMAL LOW (ref 39.0–52.0)
Hemoglobin: 9.2 g/dL — ABNORMAL LOW (ref 13.0–17.0)
Potassium: 3.8 mmol/L (ref 3.5–5.1)
Sodium: 139 mmol/L (ref 135–145)
TCO2: 27 mmol/L (ref 22–32)

## 2022-09-19 LAB — GLUCOSE, CAPILLARY
Glucose-Capillary: 123 mg/dL — ABNORMAL HIGH (ref 70–99)
Glucose-Capillary: 153 mg/dL — ABNORMAL HIGH (ref 70–99)

## 2022-09-19 SURGERY — ARTERIOVENOUS (AV) FISTULA CREATION
Anesthesia: Monitor Anesthesia Care | Site: Arm Upper | Laterality: Left

## 2022-09-19 MED ORDER — SODIUM CHLORIDE 0.9 % IV SOLN: INTRAVENOUS | Status: DC

## 2022-09-19 MED ORDER — FENTANYL CITRATE (PF) 250 MCG/5ML IJ SOLN
INTRAMUSCULAR | Status: DC | PRN
  Administered 2022-09-19: 50 ug via INTRAVENOUS

## 2022-09-19 MED ORDER — OXYCODONE HCL 5 MG/5ML PO SOLN: 5.0000 mg | Freq: Once | ORAL | Status: DC | PRN

## 2022-09-19 MED ORDER — CHLORHEXIDINE GLUCONATE 0.12 % MT SOLN: 15.0000 mL | Freq: Once | OROMUCOSAL | Status: AC

## 2022-09-19 MED ORDER — HYDROMORPHONE HCL 1 MG/ML IJ SOLN: 0.2500 mg | INTRAMUSCULAR | Status: DC | PRN

## 2022-09-19 MED ORDER — 0.9 % SODIUM CHLORIDE (POUR BTL) OPTIME
TOPICAL | Status: DC | PRN
  Administered 2022-09-19: 1000 mL

## 2022-09-19 MED ORDER — CHLORHEXIDINE GLUCONATE CLOTH 2 % EX PADS: 6.0000 | MEDICATED_PAD | Freq: Once | CUTANEOUS | Status: DC

## 2022-09-19 MED ORDER — FENTANYL CITRATE (PF) 250 MCG/5ML IJ SOLN
INTRAMUSCULAR | Status: AC
  Filled 2022-09-19: qty 5

## 2022-09-19 MED ORDER — LIDOCAINE-EPINEPHRINE 1 %-1:100000 IJ SOLN
INTRAMUSCULAR | Status: AC
  Filled 2022-09-19: qty 1

## 2022-09-19 MED ORDER — CEFAZOLIN SODIUM-DEXTROSE 2-4 GM/100ML-% IV SOLN
2.0000 g | INTRAVENOUS | Status: AC
  Administered 2022-09-19: 2 g via INTRAVENOUS
  Filled 2022-09-19: qty 100

## 2022-09-19 MED ORDER — PROPOFOL 500 MG/50ML IV EMUL
INTRAVENOUS | Status: DC | PRN
  Administered 2022-09-19: 60 ug/kg/min via INTRAVENOUS

## 2022-09-19 MED ORDER — OXYCODONE-ACETAMINOPHEN 5-325 MG PO TABS: 1.0000 | ORAL_TABLET | Freq: Four times a day (QID) | ORAL | 0 refills | Status: DC | PRN

## 2022-09-19 MED ORDER — LIDOCAINE 2% (20 MG/ML) 5 ML SYRINGE
INTRAMUSCULAR | Status: AC
  Filled 2022-09-19: qty 5

## 2022-09-19 MED ORDER — BUPIVACAINE LIPOSOME 1.3 % IJ SUSP
INTRAMUSCULAR | Status: AC
  Filled 2022-09-19: qty 20

## 2022-09-19 MED ORDER — MIDAZOLAM HCL 2 MG/2ML IJ SOLN
INTRAMUSCULAR | Status: AC
  Administered 2022-09-19: 2 mg
  Filled 2022-09-19: qty 2

## 2022-09-19 MED ORDER — ORAL CARE MOUTH RINSE: 15.0000 mL | Freq: Once | OROMUCOSAL | Status: AC

## 2022-09-19 MED ORDER — CHLORHEXIDINE GLUCONATE 0.12 % MT SOLN
OROMUCOSAL | Status: AC
  Administered 2022-09-19: 15 mL via OROMUCOSAL
  Filled 2022-09-19: qty 15

## 2022-09-19 MED ORDER — SODIUM CHLORIDE 0.9 % IV SOLN: INTRAVENOUS | Status: DC | PRN

## 2022-09-19 MED ORDER — PROPOFOL 10 MG/ML IV BOLUS
INTRAVENOUS | Status: AC
  Filled 2022-09-19: qty 20

## 2022-09-19 MED ORDER — INSULIN ASPART 100 UNIT/ML IJ SOLN: 0.0000 [IU] | INTRAMUSCULAR | Status: DC | PRN

## 2022-09-19 MED ORDER — PROPOFOL 1000 MG/100ML IV EMUL
INTRAVENOUS | Status: AC
  Filled 2022-09-19: qty 100

## 2022-09-19 MED ORDER — ONDANSETRON HCL 4 MG/2ML IJ SOLN: 4.0000 mg | Freq: Once | INTRAMUSCULAR | Status: DC | PRN

## 2022-09-19 MED ORDER — LIDOCAINE-EPINEPHRINE (PF) 1.5 %-1:200000 IJ SOLN
INTRAMUSCULAR | Status: DC | PRN
  Administered 2022-09-19: 20 mL via PERINEURAL

## 2022-09-19 MED ORDER — HEPARIN 6000 UNIT IRRIGATION SOLUTION
Status: DC | PRN
  Administered 2022-09-19: 1

## 2022-09-19 MED ORDER — OXYCODONE HCL 5 MG PO TABS: 5.0000 mg | ORAL_TABLET | Freq: Once | ORAL | Status: DC | PRN

## 2022-09-19 MED ORDER — HEPARIN 6000 UNIT IRRIGATION SOLUTION
Status: AC
  Filled 2022-09-19: qty 500

## 2022-09-19 MED ORDER — BUPIVACAINE HCL (PF) 0.25 % IJ SOLN
INTRAMUSCULAR | Status: AC
  Filled 2022-09-19: qty 30

## 2022-09-19 SURGICAL SUPPLY — 31 items
ADH SKN CLS APL DERMABOND .7 (GAUZE/BANDAGES/DRESSINGS) ×1
ARMBAND PINK RESTRICT EXTREMIT (MISCELLANEOUS) ×2 IMPLANT
BAG COUNTER SPONGE SURGICOUNT (BAG) ×1 IMPLANT
BAG SPNG CNTER NS LX DISP (BAG) ×1
CANISTER SUCT 3000ML PPV (MISCELLANEOUS) ×1 IMPLANT
CLIP TI MEDIUM 6 (CLIP) ×1 IMPLANT
CLIP TI WIDE RED SMALL 6 (CLIP) ×1 IMPLANT
COVER PROBE W GEL 5X96 (DRAPES) ×1 IMPLANT
DERMABOND ADVANCED .7 DNX12 (GAUZE/BANDAGES/DRESSINGS) ×1 IMPLANT
ELECT REM PT RETURN 9FT ADLT (ELECTROSURGICAL) ×1 IMPLANT
ELECTRODE REM PT RTRN 9FT ADLT (ELECTROSURGICAL) ×1 IMPLANT
GLOVE SURG SS PI 7.5 STRL IVOR (GLOVE) ×3 IMPLANT
GOWN STRL REUS W/ TWL LRG LVL3 (GOWN DISPOSABLE) ×2 IMPLANT
GOWN STRL REUS W/ TWL XL LVL3 (GOWN DISPOSABLE) ×1 IMPLANT
GOWN STRL REUS W/TWL LRG LVL3 (GOWN DISPOSABLE) ×2
GOWN STRL REUS W/TWL XL LVL3 (GOWN DISPOSABLE) ×1
HEMOSTAT SNOW SURGICEL 2X4 (HEMOSTASIS) IMPLANT
KIT BASIN OR (CUSTOM PROCEDURE TRAY) ×1 IMPLANT
KIT TURNOVER KIT B (KITS) ×1 IMPLANT
NS IRRIG 1000ML POUR BTL (IV SOLUTION) ×1 IMPLANT
PACK CV ACCESS (CUSTOM PROCEDURE TRAY) ×1 IMPLANT
PAD ARMBOARD 7.5X6 YLW CONV (MISCELLANEOUS) ×2 IMPLANT
SLING ARM FOAM STRAP LRG (SOFTGOODS) IMPLANT
SLING ARM FOAM STRAP MED (SOFTGOODS) IMPLANT
SUT PROLENE 6 0 CC (SUTURE) ×1 IMPLANT
SUT VIC AB 3-0 SH 27 (SUTURE) ×1
SUT VIC AB 3-0 SH 27X BRD (SUTURE) ×1 IMPLANT
SUT VICRYL 4-0 PS2 18IN ABS (SUTURE) ×1 IMPLANT
TOWEL GREEN STERILE (TOWEL DISPOSABLE) ×1 IMPLANT
UNDERPAD 30X36 HEAVY ABSORB (UNDERPADS AND DIAPERS) ×1 IMPLANT
WATER STERILE IRR 1000ML POUR (IV SOLUTION) ×1 IMPLANT

## 2022-09-19 NOTE — Discharge Instructions (Signed)
   [facility]  Discharge Instructions  AV Fistula or Graft Surgery for Dialysis Access  Please refer to the following instructions for your post-procedure care. Your surgeon or physician assistant will discuss any changes with you.  Activity  You may drive the day following your surgery, if you are comfortable and no longer taking prescription pain medication. Resume full activity as the soreness in your incision resolves.  Bathing/Showering  You may shower after you go home. Keep your incision dry for 48 hours. Do not soak in a bathtub, hot tub, or swim until the incision heals completely. You may not shower if you have a hemodialysis catheter.  Incision Care  Clean your incision with mild soap and water after 48 hours. Pat the area dry with a clean towel. You do not need a bandage unless otherwise instructed. Do not apply any ointments or creams to your incision. You may have skin glue on your incision. Do not peel it off. It will come off on its own in about one week. Your arm may swell a bit after surgery. To reduce swelling use pillows to elevate your arm so it is above your heart. Your doctor will tell you if you need to lightly wrap your arm with an ACE bandage.  Diet  Resume your normal diet. There are not special food restrictions following this procedure. In order to heal from your surgery, it is CRITICAL to get adequate nutrition. Your body requires vitamins, minerals, and protein. Vegetables are the best source of vitamins and minerals. Vegetables also provide the perfect balance of protein. Processed food has little nutritional value, so try to avoid this.  Medications  Resume taking all of your medications. If your incision is causing pain, you may take over-the counter pain relievers such as acetaminophen (Tylenol). If you were prescribed a stronger pain medication, please be aware these medications can cause nausea and constipation. Prevent  nausea by taking the medication with a snack or meal. Avoid constipation by drinking plenty of fluids and eating foods with high amount of fiber, such as fruits, vegetables, and grains.  Do not take Tylenol if you are taking prescription pain medications.  Follow up Your surgeon may want to see you in the office following your access surgery. If so, this will be arranged at the time of your surgery.  Please call us immediately for any of the following conditions:  Increased pain, redness, drainage (pus) from your incision site Fever of 101 degrees or higher Severe or worsening pain at your incision site Hand pain or numbness.  Reduce your risk of vascular disease:  Stop smoking. If you would like help, call [facility] at 1-800-QUIT-NOW (213 332 4068) or [place] at 9318229768  Manage your cholesterol Maintain a desired weight Control your diabetes Keep your blood pressure down  Dialysis  It will take several weeks to several months for your new dialysis access to be ready for use. Your surgeon will determine when it is okay to use it. Your nephrologist will continue to direct your dialysis. You can continue to use your Permcath until your new access is ready for use.   09/19/2022 Timothy [name] [id number] [birth year]  Surgeon(s): Nada Libman, MD  Procedure(s): Creation left brachiocephalic AV fistula  x Do not stick fistula for 12 weeks    If you have any questions, please call the office at [phone].

## 2022-09-19 NOTE — Transfer of Care (Signed)
Immediate Anesthesia Transfer of Care Note  Patient: Timothy [name]  Procedure(s) Performed: ARTERIOVENOUS (AV) FISTULA CREATION (Left: Arm Upper)  Patient Location: PACU  Anesthesia Type:MAC combined with regional for post-op pain  Level of Consciousness: awake, alert , and oriented  Airway & Oxygen Therapy: Patient Spontanous Breathing and Patient connected to face mask oxygen  Post-op Assessment: Report given to RN and Post -op Vital signs reviewed and stable  Post vital signs: Reviewed and stable  Last Vitals:  Vitals Value Taken Time  BP 140/62 09/19/22 0855  Temp    Pulse 72 09/19/22 0857  Resp 20 09/19/22 0857  SpO2 100 % 09/19/22 0857  Vitals shown include unfiled device data.  Last Pain:  Vitals:   09/19/22 0623  TempSrc:   PainSc: 0-No pain      Patients Stated Pain Goal: 0 (09/19/22 4098)  Complications: No notable events documented.

## 2022-09-19 NOTE — Anesthesia Procedure Notes (Addendum)
Procedure Name: MAC Date/Time: 09/19/2022 7:53 AM  Performed by: Marena Chancy, CRNAPre-anesthesia Checklist: Patient identified, Suction available, Patient being monitored and Timeout performed Patient Re-evaluated:Patient Re-evaluated prior to induction Oxygen Delivery Method: Simple face mask

## 2022-09-19 NOTE — Op Note (Signed)
    Patient name: Timothy [name] MRN: [id number] DOB: 06-13-1970 Sex: male  09/19/2022 Pre-operative Diagnosis: ESRD Post-operative diagnosis:  Same Surgeon:  [name] Assistants:  Doreatha Massed, PA Procedure:   Left brachiocephalic fistula Anesthesia:  Regional Blood Loss:  minimal Specimens:  none  Findings:  4mm artery, 4mm vein  Indications:  52 yo male with ESRD who comes in today for fistula creation  Procedure:  The patient was identified in the holding area and taken to [facility] OR ROOM 16  The patient was then placed supine on the table. regional anesthesia was administered.  The patient was prepped and draped in the usual sterile fashion.  A time out was called and antibiotics were administered.  A PA was necessary to expedite the procedure and assist with technical details.  She help with exposure by providing suction and retraction.  She help with the anastomosis by following the suture.  Ultrasound was used to evaluate the cephalic vein in the left upper arm.  This is a 4 mm uniform vein.  A transverse incision was then made just proximal to the antecubital crease.  I first dissected out the brachial artery which was a 4 mm disease-free artery.  I then exposed the cephalic vein and mobilized it throughout the width of the incision.  It was marked for orientation and ligated distally.  It distended nicely.  Next, the brachial artery was occluded with vascular clamps and a #11 blade was used to make an arteriotomy which was extended longitudinally with Potts scissors.  The cephalic vein was then cut to the appropriate length and spatulated to fit the size of the arteriotomy.  A running anastomosis was created with 6-0 Prolene with the PA following the suture.  Prior to completion, the appropriate flushing maneuvers were performed and the anastomosis was completed.  I inspected the course of the vein to make sure that there were no kinks.  There was an excellent thrill within the  fistula and multiphasic radial and ulnar Doppler signals.  The wound was then irrigated.  Hemostasis was achieved.  The incision was closed with 2 layers of Vicryl followed by Dermabond.  There were no immediate complications.   Disposition: To PACU stable.   Juleen China, M.D., [facility] [place] Office: [phone] Pager:  [phone]

## 2022-09-19 NOTE — Progress Notes (Addendum)
Patient did not complete medication reconciliation with pharmacy tech. Patient did not bring med list with him this morning but was able to let this nurse know what he is currently taking. Will contact pharmacy tech when they arrive at 7 am to see if they are able to complete med rec prior to surgery.  Dr. Salvadore Farber made aware of patient's last stated dose of Metoprolol 100 mg 24 hour tablet was yesterday at 4 pm. Ok per Dr. Salvadore Farber.

## 2022-09-19 NOTE — H&P (Signed)
[facility] [place]  Patient name: Timothy [name] MRN: [id number] DOB: 1970/06/07 Sex: male   HISOTRY OF PRESENT ILLNESS:    Timothy [name] is a 52 y.o. male who I last saw on 11-05-2021 for dialysis access.  He never had it done and is now on HD via a catheter.  The patient's renal failure secondary to diabetes and hypertension. He is right-handed. He does not have a pacemaker or defibrillator. He is not on anticoagulation.    Patient is medically managed for hypertension.  He takes a statin for hypercholesterolemia.  He is a non-smoker.  He does suffer from congestive heart failure.    PAST MEDICAL HISTORY:   Past Medical History:  Diagnosis Date   Anemia    CHF (congestive heart failure) (HCC)    Chronic kidney disease    stage 4   Diabetes mellitus without complication (HCC)    Dyspnea    Patient denies SOB since he started dialysis. States he went away after he started dialysis   Hypertension    Pneumonia      FAMILY HISTORY:   Family History  Problem Relation Age of Onset   Hypertension Mother     SOCIAL HISTORY:   Social History   Tobacco Use   Smoking status: Never   Smokeless tobacco: Never  Substance Use Topics   Alcohol use: Not Currently     ALLERGIES:   No Known Allergies   CURRENT MEDICATIONS:   Current Facility-Administered Medications  Medication Dose Route Frequency Provider Last Rate Last Admin   0.9 %  sodium chloride infusion   Intravenous Continuous Nada Libman, MD       0.9 % irrigation (POUR BTL)    PRN Nada Libman, MD   1,000 mL at 09/19/22 0719   ceFAZolin (ANCEF) IVPB 2g/100 mL premix  2 g Intravenous 30 min Pre-Op Nada Libman, MD       Chlorhexidine Gluconate Cloth 2 % PADS 6 each  6 each Topical Once Nada Libman, MD       And   Chlorhexidine Gluconate Cloth 2 % PADS 6 each  6 each Topical Once Nada Libman, MD       heparin 6000  units / NS 500 mL irrigation    PRN Nada Libman, MD   1 Application at 09/19/22 9371   insulin aspart (novoLOG) injection 0-7 Units  0-7 Units Subcutaneous Q2H PRN Lannie Fields, DO       midazolam (VERSED) 2 MG/2ML injection             REVIEW OF SYSTEMS:   [X]  denotes positive finding, [ ]  denotes negative finding Cardiac  Comments:  Chest pain or chest pressure:    Shortness of breath upon exertion:    Short of breath when lying flat:    Irregular heart rhythm:        Vascular    Pain in calf, thigh, or hip brought on by ambulation:    Pain in feet at night that wakes you up from your sleep:     Blood clot in your veins:    Leg swelling:         Pulmonary    Oxygen at home:    Productive cough:     Wheezing:         Neurologic    Sudden weakness in arms or legs:     Sudden numbness in arms  or legs:     Sudden onset of difficulty speaking or slurred speech:    Temporary loss of vision in one eye:     Problems with dizziness:         Gastrointestinal    Blood in stool:     Vomited blood:         Genitourinary    Burning when urinating:     Blood in urine:        Psychiatric    Major depression:         Hematologic    Bleeding problems:    Problems with blood clotting too easily:        Skin    Rashes or ulcers:        Constitutional    Fever or chills:      PHYSICAL EXAM:   Vitals:   09/17/22 1454 09/19/22 0556  BP:  (!) 163/93  Pulse:  67  Resp:  20  Temp:  98.4 F (36.9 C)  TempSrc:  Oral  SpO2:  95%  Weight: 76.2 kg 76.2 kg  Height: 5\' 6"  (1.676 m) 5\' 6"  (1.676 m)    GENERAL: The patient is a well-nourished male, in no acute distress. The vital signs are documented above. CARDIAC: There is a regular rate and rhythm.  VASCULAR: palp radial pulses PULMONARY: Non-labored respirations ABDOMEN: Soft and non-tender  MUSCULOSKELETAL: There are no major deformities or cyanosis. NEUROLOGIC: No focal weakness or paresthesias are  detected. SKIN: There are no ulcers or rashes noted. PSYCHIATRIC: The patient has a normal affect.  STUDIES:   none  MEDICAL ISSUES:   ESRD:  I discussed proceeding with a BCF or BVT for dialysis.  I discussed the risk of steal and non-maturity as well as the need for future intervention,  All questions answered    Charlena Cross, MD, FACS [facility] [phone] Pager [phone]

## 2022-09-19 NOTE — Anesthesia Preprocedure Evaluation (Addendum)
Anesthesia Evaluation    Reviewed: Allergy & Precautions, Patient's Chart, lab work & pertinent test results, reviewed documented beta blocker date and time   Airway Mallampati: III  TM Distance: >3 FB Neck ROM: Full    Dental  (+) Dental Advisory Given   Pulmonary neg pulmonary ROS   Pulmonary exam normal breath sounds clear to auscultation       Cardiovascular hypertension (163/93), Pt. on medications and Pt. on home beta blockers pulmonary hypertension (mild pHTN on echo)+CHF (normal LVEF, grade 2 diastolic dysfunction)  Normal cardiovascular exam Rhythm:Regular Rate:Normal  Echo 09/09/22:   1. Left ventricular ejection fraction, by estimation, is 60 to 65%. Left  ventricular ejection fraction by 3D volume is 65 %. The left ventricle has  normal function. The left ventricle has no regional wall motion  abnormalities. There is mild left  ventricular hypertrophy. Left ventricular diastolic parameters are  consistent with Grade II diastolic dysfunction (pseudonormalization).   2. Right ventricular systolic function is normal. The right ventricular  size is normal. There is mildly elevated pulmonary artery systolic  pressure. The estimated right ventricular systolic pressure is 41.9 mmHg.   3. Left atrial size was moderately dilated.   4. Right atrial size was moderately dilated.   5. The mitral valve is normal in structure. No evidence of mitral valve  regurgitation. No evidence of mitral stenosis.   6. The aortic valve is tricuspid. Aortic valve regurgitation is not  visualized. No aortic stenosis is present.   7. Aortic dilatation noted. There is borderline dilatation of the  ascending aorta, measuring 38 mm.   8. The inferior vena cava is normal in size with greater than 50%  respiratory variability, suggesting right atrial pressure of 3 mmHg.     Neuro/Psych negative neurological ROS  negative psych ROS   GI/Hepatic Neg  liver ROS,GERD  Medicated and Controlled,,  Endo/Other  diabetes, Well Controlled, Type 2, Insulin Dependent  K 3.8  Renal/GU Renal disease  negative genitourinary   Musculoskeletal negative musculoskeletal ROS (+)    Abdominal   Peds  Hematology  (+) Blood dyscrasia, anemia Hb 9.2   Anesthesia Other Findings Ozempic LD:   Reproductive/Obstetrics negative OB ROS                             Anesthesia Physical Anesthesia Plan  ASA: 3  Anesthesia Plan: MAC and Regional   Post-op Pain Management: Regional block*   Induction:   PONV Risk Score and Plan: 2 and Propofol infusion and TIVA  Airway Management Planned: Natural Airway and Simple Face Mask  Additional Equipment: None  Intra-op Plan:   Post-operative Plan:   Informed Consent: I have reviewed the patients History and Physical, chart, labs and discussed the procedure including the risks, benefits and alternatives for the proposed anesthesia with the patient or authorized representative who has indicated his/her understanding and acceptance.       Plan Discussed with: CRNA  Anesthesia Plan Comments:         Anesthesia Quick Evaluation

## 2022-09-19 NOTE — Anesthesia Postprocedure Evaluation (Signed)
Anesthesia Post Note  Patient: Timothy [name]  Procedure(s) Performed: ARTERIOVENOUS (AV) FISTULA CREATION (Left: Arm Upper)     Patient location during evaluation: PACU Anesthesia Type: Regional Level of consciousness: awake and alert Pain management: pain level controlled Vital Signs Assessment: post-procedure vital signs reviewed and stable Respiratory status: spontaneous breathing, nonlabored ventilation and respiratory function stable Cardiovascular status: blood pressure returned to baseline and stable Postop Assessment: no apparent nausea or vomiting Anesthetic complications: no   No notable events documented.  Last Vitals:  Vitals:   09/19/22 0900 09/19/22 0911  BP: (!) 122/57 124/69  Pulse: 71 66  Resp: 14 14  Temp:  36.7 C  SpO2: 96% 95%    Last Pain:  Vitals:   09/19/22 0855  TempSrc:   PainSc: Asleep                 Lowella Curb

## 2022-09-20 ENCOUNTER — Encounter (HOSPITAL_COMMUNITY): Payer: Self-pay | Admitting: Surgery

## 2022-09-30 ENCOUNTER — Ambulatory Visit (HOSPITAL_BASED_OUTPATIENT_CLINIC_OR_DEPARTMENT_OTHER): Payer: Medicaid Other | Admitting: Family

## 2022-10-24 ENCOUNTER — Other Ambulatory Visit: Payer: Self-pay | Admitting: *Deleted

## 2022-10-24 DIAGNOSIS — N184 Chronic kidney disease, stage 4 (severe): Secondary | ICD-10-CM

## 2022-11-01 DIAGNOSIS — D509 Iron deficiency anemia, unspecified: Secondary | ICD-10-CM | POA: Insufficient documentation

## 2022-11-04 ENCOUNTER — Ambulatory Visit (HOSPITAL_COMMUNITY)
Admission: RE | Admit: 2022-11-04 | Discharge: 2022-11-04 | Disposition: A | Discharge status: Home / Self Care | Payer: Medicare Other | Source: Ambulatory Visit | Attending: Vascular Surgery | Admitting: Vascular Surgery

## 2022-11-04 ENCOUNTER — Ambulatory Visit (INDEPENDENT_AMBULATORY_CARE_PROVIDER_SITE_OTHER): Payer: Medicare Other | Admitting: Physician Assistant

## 2022-11-04 VITALS — BP 192/102 | HR 74 | Temp 98.3°F | Resp 16 | Ht 66.0 in | Wt 174.4 lb

## 2022-11-04 DIAGNOSIS — N186 End stage renal disease: Secondary | ICD-10-CM

## 2022-11-04 DIAGNOSIS — N184 Chronic kidney disease, stage 4 (severe): Secondary | ICD-10-CM | POA: Insufficient documentation

## 2022-11-04 DIAGNOSIS — Z992 Dependence on renal dialysis: Secondary | ICD-10-CM

## 2022-11-04 NOTE — H&P (View-Only) (Signed)
Postoperative Access Visit   History of Present Illness   Timothy [name] is a 52 y.o. year old male who presents for postoperative follow-up for: left brachiocephalic AV fistula creation by Dr. Myra Gianotti on 09/19/22.  The patient's wounds are well healed.  The patient notes no steal symptoms.  The patient is able to complete their activities of daily living.   He currently dialyzes via a right internal jugular TDC on MWF at the [facility] location  Physical Examination   Vitals:   11/04/22 0905  BP: (!) 192/102  Pulse: 74  Resp: 16  Temp: 98.3 F (36.8 C)  Weight: 174 lb 6.4 oz (79.1 kg)  Height: 5\' 6"  (1.676 m)   Body mass index is 28.15 kg/m.  left arm Incision is well healed, 2+ radial pulse, hand grip is 5/5, sensation in digits is intact, palpable thrill, bruit can be auscultated. Fistula is tortuous and you area able to see the visible branching   Non invasive vascular lab: Findings:  +--------------------+----------+-----------------+--------+  AVF                PSV (cm/s)Flow Vol (mL/min)Comments  +--------------------+----------+-----------------+--------+  Native artery inflow   331          1458                 +--------------------+----------+-----------------+--------+  AVF Anastomosis        552                               +--------------------+----------+-----------------+--------+     +------------+----------+-------------+----------+-------------------------  ----+  OUTFLOW VEINPSV (cm/s)Diameter (cm)Depth (cm)          Describe              +------------+----------+-------------+----------+-------------------------  ----+  Shoulder      574        0.39        0.31       stenotic and  thrombus      +------------+----------+-------------+----------+-------------------------  ----+  Prox UA        541        0.50        0.22   Retained valve, stenotic  and                                                 competing branches  measuring                                                       0.20cm, 0.22cm.          +------------+----------+-------------+----------+-------------------------  ----+  Mid UA         295        0.49        0.17                                   +------------+----------+-------------+----------+-------------------------  ----+  Dist UA        229        0.58  0.19    competing branch  measuring                                                             0.23                +------------+----------+-------------+----------+-------------------------  ----+  AC Fossa       516        0.62        0.37                                   +------------+----------+-------------+----------+-------------------------  ----+   Summary:  Patent arteriovenous fistula. Arteriovenous fistula-Thrombus noted in the outflow vein at the shoulder  causing stenosis. Arteriovenous fistula-Stenosis noted in the outflow vein at the proximal upper  arm, likely secondary to retained valve. Competing branches as mentioned above. Volume flow= 145mL/min   Medical Decision Making   Timothy [name] is a 52 y.o. year old male who presents s/p left brachiocephalic AV fistula creation by Dr. Myra Gianotti on 09/19/22.  The patient's wounds are well healed.  Patent is without signs or symptoms of steal syndrome. The fistula has a great thrill. It is tortuous in the left upper arm. The duplex shows adequate volume flow. There is thrombus noted in outflow vein as well as competing branches that are causing areas of stenosis. For this reason I have recommended a left upper arm Fistulogram to further evaluate the stenosis. I discussed with patient that he may need ligation of the competing branches in the future if they become a problem. I will arrange the Fistulogram in the near future on a non dialysis day with Dr. Myra Gianotti. He dialyzes on MWF so will try to work  around his dialysis schedule.    Graceann Congress, PA-C [facility] [place] Office: [phone]  Clinic MD: Myra Gianotti

## 2022-11-04 NOTE — Progress Notes (Signed)
Postoperative Access Visit   History of Present Illness   Timothy [name] is a 52 y.o. year old male who presents for postoperative follow-up for: left brachiocephalic AV fistula creation by Dr. Myra Gianotti on 09/19/22.  The patient's wounds are well healed.  The patient notes no steal symptoms.  The patient is able to complete their activities of daily living.   He currently dialyzes via a right internal jugular TDC on MWF at the [facility] location  Physical Examination   Vitals:   11/04/22 0905  BP: (!) 192/102  Pulse: 74  Resp: 16  Temp: 98.3 F (36.8 C)  Weight: 174 lb 6.4 oz (79.1 kg)  Height: 5\' 6"  (1.676 m)   Body mass index is 28.15 kg/m.  left arm Incision is well healed, 2+ radial pulse, hand grip is 5/5, sensation in digits is intact, palpable thrill, bruit can be auscultated. Fistula is tortuous and you area able to see the visible branching   Non invasive vascular lab: Findings:  +--------------------+----------+-----------------+--------+  AVF                PSV (cm/s)Flow Vol (mL/min)Comments  +--------------------+----------+-----------------+--------+  Native artery inflow   331          1458                 +--------------------+----------+-----------------+--------+  AVF Anastomosis        552                               +--------------------+----------+-----------------+--------+     +------------+----------+-------------+----------+-------------------------  ----+  OUTFLOW VEINPSV (cm/s)Diameter (cm)Depth (cm)          Describe              +------------+----------+-------------+----------+-------------------------  ----+  Shoulder      574        0.39        0.31       stenotic and  thrombus      +------------+----------+-------------+----------+-------------------------  ----+  Prox UA        541        0.50        0.22   Retained valve, stenotic  and                                                 competing branches  measuring                                                       0.20cm, 0.22cm.          +------------+----------+-------------+----------+-------------------------  ----+  Mid UA         295        0.49        0.17                                   +------------+----------+-------------+----------+-------------------------  ----+  Dist UA        229        0.58  0.19    competing branch  measuring                                                             0.23                +------------+----------+-------------+----------+-------------------------  ----+  AC Fossa       516        0.62        0.37                                   +------------+----------+-------------+----------+-------------------------  ----+   Summary:  Patent arteriovenous fistula. Arteriovenous fistula-Thrombus noted in the outflow vein at the shoulder  causing stenosis. Arteriovenous fistula-Stenosis noted in the outflow vein at the proximal upper  arm, likely secondary to retained valve. Competing branches as mentioned above. Volume flow= 145mL/min   Medical Decision Making   Timothy [name] is a 52 y.o. year old male who presents s/p left brachiocephalic AV fistula creation by Dr. Myra Gianotti on 09/19/22.  The patient's wounds are well healed.  Patent is without signs or symptoms of steal syndrome. The fistula has a great thrill. It is tortuous in the left upper arm. The duplex shows adequate volume flow. There is thrombus noted in outflow vein as well as competing branches that are causing areas of stenosis. For this reason I have recommended a left upper arm Fistulogram to further evaluate the stenosis. I discussed with patient that he may need ligation of the competing branches in the future if they become a problem. I will arrange the Fistulogram in the near future on a non dialysis day with Dr. Myra Gianotti. He dialyzes on MWF so will try to work  around his dialysis schedule.    Graceann Congress, PA-C [facility] [place] Office: [phone]  Clinic MD: Myra Gianotti

## 2022-11-05 ENCOUNTER — Other Ambulatory Visit: Payer: Self-pay

## 2022-11-05 DIAGNOSIS — N186 End stage renal disease: Secondary | ICD-10-CM

## 2022-11-05 MED ORDER — SODIUM CHLORIDE 0.9% FLUSH: 3.0000 mL | Freq: Two times a day (BID) | INTRAVENOUS | Status: DC

## 2022-11-05 MED ORDER — SODIUM CHLORIDE 0.9 % IV SOLN: 250.0000 mL | INTRAVENOUS | Status: DC | PRN

## 2022-11-05 MED ORDER — SODIUM CHLORIDE 0.9% FLUSH: 3.0000 mL | INTRAVENOUS | Status: DC | PRN

## 2022-11-06 DIAGNOSIS — R52 Pain, unspecified: Secondary | ICD-10-CM | POA: Diagnosis not present

## 2022-11-06 DIAGNOSIS — D631 Anemia in chronic kidney disease: Secondary | ICD-10-CM | POA: Diagnosis not present

## 2022-11-06 DIAGNOSIS — N2581 Secondary hyperparathyroidism of renal origin: Secondary | ICD-10-CM | POA: Diagnosis not present

## 2022-11-06 DIAGNOSIS — D689 Coagulation defect, unspecified: Secondary | ICD-10-CM | POA: Diagnosis not present

## 2022-11-06 DIAGNOSIS — N186 End stage renal disease: Secondary | ICD-10-CM | POA: Diagnosis not present

## 2022-11-06 DIAGNOSIS — Z992 Dependence on renal dialysis: Secondary | ICD-10-CM | POA: Diagnosis not present

## 2022-11-06 DIAGNOSIS — E1122 Type 2 diabetes mellitus with diabetic chronic kidney disease: Secondary | ICD-10-CM | POA: Diagnosis not present

## 2022-11-06 NOTE — Progress Notes (Signed)
Timothy [name]    [id number]    1970/12/28  Primary Care Physician:[name], [name], MD  Referring Physician: [name], Buren Kos, MD [address] [place],  [place] [address]   Chief complaint:  Chief Complaint  Patient presents with   Hematemisis    Been spitting up dark red blood for about 1 year every since he found out he had kidney trouble   HPI: Timothy [name] is a 52 y.o. male presenting to clinic today for consult for hematemesis. He is currently on dialysis for ESRD.  Today, he complains of intermittent episodes of coughing blood which tends to occur with/after coughing. His symptoms are worsened after plenty of activities. He denies experiencing any symptoms this week. His symptoms are not effected by seasons and are not worsened during winter. He reports starting dialysis 2 months ago, his last dose was yesterday. He states that his symptoms are not correlated with his dialysis treatments. He denies any accompanying vomiting or nose bleeds.  He also complains of intermittent abdominal pain and states that he experiences some relief when holding the area. He reports typically having 3-4x BM a day. His stool tends to be soft and he repots occasionally noticing some blood. He denies taking any aspirin or NSAID's.  He reports a history of Pneumonia that occurred last year and a distant history of Bronchitis during his younger years. He's unsure if he was diagnosed with COPD. He denies being a smoker or following up with a pulmonologist. He also denies undergoing any previous GI screenings before.   Patient denies any constipation, nausea, black stool, vomiting, bloating, unintentional weight loss, reflux, or dysphagia.   GI Hx:  CT Renal stone study 11-15-20 -Extensive bibasilar pulmonary infiltrate consolidation, most severe within the left lower lobe, in keeping with multifocal infection or aspiration. -No acute intra-abdominal pathology  identified. -Aortic Atherosclerosis    Current Outpatient Medications:    amLODipine (NORVASC) 10 MG tablet, Take 10 mg by mouth daily., Disp: , Rfl:    carvedilol (COREG) 25 MG tablet, Take 25 mg by mouth 2 (two) times daily., Disp: , Rfl:    ENTRESTO 49-51 MG, Take 1 tablet by mouth 2 (two) times daily., Disp: , Rfl:    FARXIGA 10 MG TABS tablet, Take 10 mg by mouth daily., Disp: , Rfl:    furosemide (LASIX) 80 MG tablet, Take 80 mg by mouth daily., Disp: , Rfl:    glimepiride (AMARYL) 4 MG tablet, Take 4 mg by mouth daily., Disp: , Rfl:    hydrALAZINE (APRESOLINE) 100 MG tablet, Take 50 mg by mouth 3 (three) times daily. MAY TAKE EXTRA 50MG  FOR SBP>160, Disp: , Rfl:    hydrOXYzine (VISTARIL) 25 MG capsule, Take 25 mg by mouth at bedtime., Disp: , Rfl:    insulin glargine (LANTUS SOLOSTAR) 100 UNIT/ML Solostar Pen, INJECT 5 UINTS INTO THE SKIN DAILY, Disp: 15 mL, Rfl: 1   Insulin Pen Needle (PEN NEEDLES) 31G X 5 MM MISC, 1 each by Does not apply route daily., Disp: 100 each, Rfl: 1   isosorbide mononitrate (IMDUR) 30 MG 24 hr tablet, Take 30 mg by mouth daily., Disp: , Rfl:    losartan (COZAAR) 25 MG tablet, Take 25 mg by mouth daily., Disp: , Rfl:    meloxicam (MOBIC) 15 MG tablet, Take 15 mg by mouth daily., Disp: , Rfl:    metolazone (ZAROXOLYN) 2.5 MG tablet, Take 2.5  mg by mouth daily., Disp: , Rfl:    metoprolol succinate (TOPROL-XL) 100 MG 24 hr tablet, Take 1 tablet (100 mg total) by mouth daily., Disp: 30 tablet, Rfl: 3   omeprazole (PRILOSEC) 40 MG capsule, Take 40 mg by mouth daily., Disp: , Rfl:    OZEMPIC, 0.25 OR 0.5 MG/DOSE, 2 MG/3ML SOPN, Inject into the skin. 09/17/22 Is not taking, Disp: , Rfl:    potassium chloride SA (KLOR-CON M) 20 MEQ tablet, Take 20 mEq by mouth daily., Disp: , Rfl:    spironolactone (ALDACTONE) 25 MG tablet, Take 25 mg by mouth daily., Disp: , Rfl:    torsemide (DEMADEX) 20 MG tablet, Take 20 mg by mouth 2 (two) times daily., Disp: , Rfl:     Continuous Glucose Sensor (FREESTYLE LIBRE 3 SENSOR) MISC, See admin instructions. 09/17/22 - does not have (Patient not taking: Reported on 11/07/2022), Disp: , Rfl:   Current Facility-Administered Medications:    0.9 %  sodium chloride infusion, 250 mL, Intravenous, PRN, Nada Libman, MD   sodium chloride flush (NS) 0.9 % injection 3 mL, 3 mL, Intravenous, Q12H, Brabham, Fran Lowes, MD   sodium chloride flush (NS) 0.9 % injection 3 mL, 3 mL, Intravenous, PRN, Nada Libman, MD   Allergies as of 11/07/2022   (No Known Allergies)    Past Medical History:  Diagnosis Date   Allergies    Anemia    Blind left eye    CHF (congestive heart failure) (HCC)    Chronic kidney disease    stage 4   Diabetes mellitus without complication (HCC)    Dyspnea    Patient denies SOB since he started dialysis. States he went away after he started dialysis   HLD (hyperlipidemia)    Hypertension    Pneumonia     Past Surgical History:  Procedure Laterality Date   AV FISTULA PLACEMENT Left 09/19/2022   Procedure: ARTERIOVENOUS (AV) FISTULA CREATION;  Surgeon: Nada Libman, MD;  Location: [facility] OR;  Service: Vascular;  Laterality: Left;   CATARACT EXTRACTION Right    MENISCUS REPAIR Left     Family History  Problem Relation Age of Onset   Hypertension Mother    Diabetes Mother    Heart attack Mother    Diabetes Brother     Social History   Socioeconomic History   Marital status: Single    Spouse name: Not on file   Number of children: 1   Years of education: Not on file   Highest education level: Not on file  Occupational History   Occupation: disabled  Tobacco Use   Smoking status: Never   Smokeless tobacco: Never  Vaping Use   Vaping status: Never Used  Substance and Sexual Activity   Alcohol use: Not Currently   Drug use: Not Currently   Sexual activity: Not on file  Other Topics Concern   Not on file  Social History Narrative   Not on file   Social Determinants of  Health   Financial Resource Strain: Not on file  Food Insecurity: Not on file  Transportation Needs: Not on file  Physical Activity: Not on file  Stress: Not on file  Social Connections: Not on file  Intimate Partner Violence: Not on file      Review of systems: Review of Systems  Constitutional:  Negative for unexpected weight change.  HENT:  Negative for trouble swallowing.   Gastrointestinal:  Positive for abdominal pain, blood in stool and diarrhea. Negative  for abdominal distention, anal bleeding, constipation, nausea, rectal pain and vomiting.      Physical Exam: Vitals:   11/07/22 1112  BP: 136/74  Pulse: 96   Body mass index is 27.74 kg/m.  General: well-appearing   Eyes: sclera anicteric, no redness ENT: oral mucosa moist without lesions, no cervical or supraclavicular lymphadenopathy CV: RRR, no JVD, no peripheral edema, crackles Resp: clear to auscultation bilaterally, normal RR and effort noted GI: soft, no tenderness, with active bowel sounds. No guarding or palpable organomegaly noted. Skin; warm and dry, no rash or jaundice noted Neuro: awake, alert and oriented x 3. Normal gross motor function and fluent speech   Data Reviewed:  Reviewed labs, radiology imaging, old records and pertinent past GI work up   Assessment and Plan/Recommendations: 52 year old very pleasant male with history of ESRD on dialysis here with complaints of worsening hemoptysis Will refer to pulmonary for evaluation of hemoptysis  Plan to proceed with EGD and colonoscopy for further evaluation of anemia, past due for colorectal cancer screening.  Will need to exclude erosive esophagitis or any neoplastic lesion.  He is currently undergoing workup to be listed for renal transplant He is scheduled to undergo AV fistula revision later this month Plan to schedule EGD and colonoscopy at hospital given history of end-stage renal disease   The patient was provided an opportunity to  ask questions and all were answered. The patient agreed with the plan and demonstrated an understanding of the instructions.  Iona Beard , MD  CC: [name], Buren Kos, MD   Ladona Mow Hewitt Shorts as a scribe for Marsa Aris, MD.,have documented all relevant documentation on the behalf of Marsa Aris, MD,as directed by  Marsa Aris, MD while in the presence of Marsa Aris, MD.   I, Marsa Aris, MD, have reviewed all documentation for this visit. The documentation on 11/07/22 for the exam, diagnosis, procedures, and orders are all accurate and complete.

## 2022-11-06 NOTE — H&P (View-Only) (Signed)
Izsak Meir [name]    [id number]    1970/12/28  Primary Care Physician:de [name], Timothy J, MD  Referring Physician: de [name], Timothy Kos, MD [address] [place],  [place] [address]   Chief complaint:  Chief Complaint  Patient presents with   Hematemisis    Been spitting up dark red blood for about 1 year every since he found out he had kidney trouble   HPI: Timothy [name] is a 52 y.o. male presenting to clinic today for consult for hematemesis. He is currently on dialysis for ESRD.  Today, he complains of intermittent episodes of coughing blood which tends to occur with/after coughing. His symptoms are worsened after plenty of activities. He denies experiencing any symptoms this week. His symptoms are not effected by seasons and are not worsened during winter. He reports starting dialysis 2 months ago, his last dose was yesterday. He states that his symptoms are not correlated with his dialysis treatments. He denies any accompanying vomiting or nose bleeds.  He also complains of intermittent abdominal pain and states that he experiences some relief when holding the area. He reports typically having 3-4x BM a day. His stool tends to be soft and he repots occasionally noticing some blood. He denies taking any aspirin or NSAID's.  He reports a history of Pneumonia that occurred last year and a distant history of Bronchitis during his younger years. He's unsure if he was diagnosed with COPD. He denies being a smoker or following up with a pulmonologist. He also denies undergoing any previous GI screenings before.   Patient denies any constipation, nausea, black stool, vomiting, bloating, unintentional weight loss, reflux, or dysphagia.   GI Hx:  CT Renal stone study 11-15-20 -Extensive bibasilar pulmonary infiltrate consolidation, most severe within the left lower lobe, in keeping with multifocal infection or aspiration. -No acute intra-abdominal pathology  identified. -Aortic Atherosclerosis    Current Outpatient Medications:    amLODipine (NORVASC) 10 MG tablet, Take 10 mg by mouth daily., Disp: , Rfl:    carvedilol (COREG) 25 MG tablet, Take 25 mg by mouth 2 (two) times daily., Disp: , Rfl:    ENTRESTO 49-51 MG, Take 1 tablet by mouth 2 (two) times daily., Disp: , Rfl:    FARXIGA 10 MG TABS tablet, Take 10 mg by mouth daily., Disp: , Rfl:    furosemide (LASIX) 80 MG tablet, Take 80 mg by mouth daily., Disp: , Rfl:    glimepiride (AMARYL) 4 MG tablet, Take 4 mg by mouth daily., Disp: , Rfl:    hydrALAZINE (APRESOLINE) 100 MG tablet, Take 50 mg by mouth 3 (three) times daily. MAY TAKE EXTRA 50MG  FOR SBP>160, Disp: , Rfl:    hydrOXYzine (VISTARIL) 25 MG capsule, Take 25 mg by mouth at bedtime., Disp: , Rfl:    insulin glargine (LANTUS SOLOSTAR) 100 UNIT/ML Solostar Pen, INJECT 5 UINTS INTO THE SKIN DAILY, Disp: 15 mL, Rfl: 1   Insulin Pen Needle (PEN NEEDLES) 31G X 5 MM MISC, 1 each by Does not apply route daily., Disp: 100 each, Rfl: 1   isosorbide mononitrate (IMDUR) 30 MG 24 hr tablet, Take 30 mg by mouth daily., Disp: , Rfl:    losartan (COZAAR) 25 MG tablet, Take 25 mg by mouth daily., Disp: , Rfl:    meloxicam (MOBIC) 15 MG tablet, Take 15 mg by mouth daily., Disp: , Rfl:    metolazone (ZAROXOLYN) 2.5 MG tablet, Take 2.5  mg by mouth daily., Disp: , Rfl:    metoprolol succinate (TOPROL-XL) 100 MG 24 hr tablet, Take 1 tablet (100 mg total) by mouth daily., Disp: 30 tablet, Rfl: 3   omeprazole (PRILOSEC) 40 MG capsule, Take 40 mg by mouth daily., Disp: , Rfl:    OZEMPIC, 0.25 OR 0.5 MG/DOSE, 2 MG/3ML SOPN, Inject into the skin. 09/17/22 Is not taking, Disp: , Rfl:    potassium chloride SA (KLOR-CON M) 20 MEQ tablet, Take 20 mEq by mouth daily., Disp: , Rfl:    spironolactone (ALDACTONE) 25 MG tablet, Take 25 mg by mouth daily., Disp: , Rfl:    torsemide (DEMADEX) 20 MG tablet, Take 20 mg by mouth 2 (two) times daily., Disp: , Rfl:     Continuous Glucose Sensor (FREESTYLE LIBRE 3 SENSOR) MISC, See admin instructions. 09/17/22 - does not have (Patient not taking: Reported on 11/07/2022), Disp: , Rfl:   Current Facility-Administered Medications:    0.9 %  sodium chloride infusion, 250 mL, Intravenous, PRN, Nada Libman, MD   sodium chloride flush (NS) 0.9 % injection 3 mL, 3 mL, Intravenous, Q12H, Brabham, Fran Lowes, MD   sodium chloride flush (NS) 0.9 % injection 3 mL, 3 mL, Intravenous, PRN, Nada Libman, MD   Allergies as of 11/07/2022   (No Known Allergies)    Past Medical History:  Diagnosis Date   Allergies    Anemia    Blind left eye    CHF (congestive heart failure) (HCC)    Chronic kidney disease    stage 4   Diabetes mellitus without complication (HCC)    Dyspnea    Patient denies SOB since he started dialysis. States he went away after he started dialysis   HLD (hyperlipidemia)    Hypertension    Pneumonia     Past Surgical History:  Procedure Laterality Date   AV FISTULA PLACEMENT Left 09/19/2022   Procedure: ARTERIOVENOUS (AV) FISTULA CREATION;  Surgeon: Nada Libman, MD;  Location: [facility] OR;  Service: Vascular;  Laterality: Left;   CATARACT EXTRACTION Right    MENISCUS REPAIR Left     Family History  Problem Relation Age of Onset   Hypertension Mother    Diabetes Mother    Heart attack Mother    Diabetes Brother     Social History   Socioeconomic History   Marital status: Single    Spouse name: Not on file   Number of children: 1   Years of education: Not on file   Highest education level: Not on file  Occupational History   Occupation: disabled  Tobacco Use   Smoking status: Never   Smokeless tobacco: Never  Vaping Use   Vaping status: Never Used  Substance and Sexual Activity   Alcohol use: Not Currently   Drug use: Not Currently   Sexual activity: Not on file  Other Topics Concern   Not on file  Social History Narrative   Not on file   Social Determinants of  Health   Financial Resource Strain: Not on file  Food Insecurity: Not on file  Transportation Needs: Not on file  Physical Activity: Not on file  Stress: Not on file  Social Connections: Not on file  Intimate Partner Violence: Not on file      Review of systems: Review of Systems  Constitutional:  Negative for unexpected weight change.  HENT:  Negative for trouble swallowing.   Gastrointestinal:  Positive for abdominal pain, blood in stool and diarrhea. Negative  for abdominal distention, anal bleeding, constipation, nausea, rectal pain and vomiting.      Physical Exam: Vitals:   11/07/22 1112  BP: 136/74  Pulse: 96   Body mass index is 27.74 kg/m.  General: well-appearing   Eyes: sclera anicteric, no redness ENT: oral mucosa moist without lesions, no cervical or supraclavicular lymphadenopathy CV: RRR, no JVD, no peripheral edema, crackles Resp: clear to auscultation bilaterally, normal RR and effort noted GI: soft, no tenderness, with active bowel sounds. No guarding or palpable organomegaly noted. Skin; warm and dry, no rash or jaundice noted Neuro: awake, alert and oriented x 3. Normal gross motor function and fluent speech   Data Reviewed:  Reviewed labs, radiology imaging, old records and pertinent past GI work up   Assessment and Plan/Recommendations: 52 year old very pleasant male with history of ESRD on dialysis here with complaints of worsening hemoptysis Will refer to pulmonary for evaluation of hemoptysis  Plan to proceed with EGD and colonoscopy for further evaluation of anemia, past due for colorectal cancer screening.  Will need to exclude erosive esophagitis or any neoplastic lesion.  He is currently undergoing workup to be listed for renal transplant He is scheduled to undergo AV fistula revision later this month Plan to schedule EGD and colonoscopy at hospital given history of end-stage renal disease   The patient was provided an opportunity to  ask questions and all were answered. The patient agreed with the plan and demonstrated an understanding of the instructions.  Iona Beard , MD  CC: de [name], Timothy Kos, MD   Ladona Mow Hewitt Shorts as a scribe for Marsa Aris, MD.,have documented all relevant documentation on the behalf of Marsa Aris, MD,as directed by  Marsa Aris, MD while in the presence of Marsa Aris, MD.   I, Marsa Aris, MD, have reviewed all documentation for this visit. The documentation on 11/07/22 for the exam, diagnosis, procedures, and orders are all accurate and complete.

## 2022-11-07 ENCOUNTER — Encounter: Payer: Self-pay | Admitting: Gastroenterology

## 2022-11-07 ENCOUNTER — Other Ambulatory Visit (INDEPENDENT_AMBULATORY_CARE_PROVIDER_SITE_OTHER): Payer: 59

## 2022-11-07 ENCOUNTER — Ambulatory Visit: Payer: 59 | Admitting: Gastroenterology

## 2022-11-07 VITALS — BP 136/74 | HR 96 | Ht 65.25 in | Wt 168.0 lb

## 2022-11-07 DIAGNOSIS — R042 Hemoptysis: Secondary | ICD-10-CM

## 2022-11-07 DIAGNOSIS — Z992 Dependence on renal dialysis: Secondary | ICD-10-CM | POA: Diagnosis not present

## 2022-11-07 DIAGNOSIS — R195 Other fecal abnormalities: Secondary | ICD-10-CM | POA: Diagnosis not present

## 2022-11-07 DIAGNOSIS — R1084 Generalized abdominal pain: Secondary | ICD-10-CM | POA: Diagnosis not present

## 2022-11-07 DIAGNOSIS — N186 End stage renal disease: Secondary | ICD-10-CM | POA: Diagnosis not present

## 2022-11-07 LAB — COMPREHENSIVE METABOLIC PANEL
ALT: 11 U/L (ref 0–53)
AST: 14 U/L (ref 0–37)
Albumin: 3.9 g/dL (ref 3.5–5.2)
Alkaline Phosphatase: 77 U/L (ref 39–117)
BUN: 28 mg/dL — ABNORMAL HIGH (ref 6–23)
CO2: 33 meq/L — ABNORMAL HIGH (ref 19–32)
Calcium: 8.5 mg/dL (ref 8.4–10.5)
Chloride: 99 meq/L (ref 96–112)
Creatinine, Ser: 7.97 mg/dL (ref 0.40–1.50)
GFR: 7.22 mL/min — CL (ref >60.00)
Glucose, Bld: 120 mg/dL — ABNORMAL HIGH (ref 70–99)
Potassium: 3.9 meq/L (ref 3.5–5.1)
Sodium: 140 meq/L (ref 135–145)
Total Bilirubin: 0.4 mg/dL (ref 0.2–1.2)
Total Protein: 8.2 g/dL (ref 6.0–8.3)

## 2022-11-07 LAB — CBC WITH DIFFERENTIAL/PLATELET
Basophils Absolute: 0 10*3/uL (ref 0.0–0.1)
Basophils Relative: 0.9 % (ref 0.0–3.0)
Eosinophils Absolute: 0.3 10*3/uL (ref 0.0–0.7)
Eosinophils Relative: 5 % (ref 0.0–5.0)
HCT: 36.4 % — ABNORMAL LOW (ref 39.0–52.0)
Hemoglobin: 11.6 g/dL — ABNORMAL LOW (ref 13.0–17.0)
Lymphocytes Relative: 20.2 % (ref 12.0–46.0)
Lymphs Abs: 1.1 10*3/uL (ref 0.7–4.0)
MCHC: 31.9 g/dL (ref 30.0–36.0)
MCV: 80.2 fL (ref 78.0–100.0)
Monocytes Absolute: 0.9 10*3/uL (ref 0.1–1.0)
Monocytes Relative: 17.2 % — ABNORMAL HIGH (ref 3.0–12.0)
Neutro Abs: 3 10*3/uL (ref 1.4–7.7)
Neutrophils Relative %: 56.7 % (ref 43.0–77.0)
Platelets: 272 10*3/uL (ref 150.0–400.0)
RBC: 4.53 Mil/uL (ref 4.22–5.81)
RDW: 17.1 % — ABNORMAL HIGH (ref 11.5–15.5)
WBC: 5.2 10*3/uL (ref 4.0–10.5)

## 2022-11-07 LAB — PROTIME-INR
INR: 1.2 {ratio} — ABNORMAL HIGH (ref 0.8–1.0)
Prothrombin Time: 12.2 s (ref 9.6–13.1)

## 2022-11-07 NOTE — Patient Instructions (Signed)
You have been scheduled for a Colonoscopy/Endoscopy at [facility] on 12/05/2022 at 11:15 am. Separate instructions have been given   Your provider has requested that you go to the basement level for lab work before leaving today. Press "B" on the elevator. The lab is located at the first door on the left as you exit the elevator.   We have referred you to [facility], They will contact you with an appointment  Due to recent changes in healthcare laws, you may see the results of your imaging and laboratory studies on MyChart before your provider has had a chance to review them.  We understand that in some cases there may be results that are confusing or concerning to you. Not all laboratory results come back in the same time frame and the provider may be waiting for multiple results in order to interpret others.  Please give [place] 48 hours in order for your provider to thoroughly review all the results before contacting the office for clarification of your results.    _______________________________________________________  If your blood pressure at your visit was 140/90 or greater, please contact your primary care physician to follow up on this.  _______________________________________________________  If you are age 7 or older, your body mass index should be between 23-30. Your Body mass index is 27.74 kg/m. If this is out of the aforementioned range listed, please consider follow up with your Primary Care Provider.  If you are age 19 or younger, your body mass index should be between 19-25. Your Body mass index is 27.74 kg/m. If this is out of the aformentioned range listed, please consider follow up with your Primary Care Provider.   ________________________________________________________  The [facility] providers would like to encourage you to use [facility] to communicate with providers for non-urgent requests or questions.  Due to long hold times on the telephone, sending  your provider a message by [facility] may be a faster and more efficient way to get a response.  Please allow 48 business hours for a response.  Please remember that this is for non-urgent requests.  _______________________________________________________   I appreciate the  opportunity to care for you  Thank You   Marsa Aris , MD

## 2022-11-08 DIAGNOSIS — N186 End stage renal disease: Secondary | ICD-10-CM | POA: Diagnosis not present

## 2022-11-08 DIAGNOSIS — D689 Coagulation defect, unspecified: Secondary | ICD-10-CM | POA: Diagnosis not present

## 2022-11-08 DIAGNOSIS — D631 Anemia in chronic kidney disease: Secondary | ICD-10-CM | POA: Diagnosis not present

## 2022-11-08 DIAGNOSIS — E1122 Type 2 diabetes mellitus with diabetic chronic kidney disease: Secondary | ICD-10-CM | POA: Diagnosis not present

## 2022-11-08 DIAGNOSIS — R52 Pain, unspecified: Secondary | ICD-10-CM | POA: Diagnosis not present

## 2022-11-08 DIAGNOSIS — Z992 Dependence on renal dialysis: Secondary | ICD-10-CM | POA: Diagnosis not present

## 2022-11-08 DIAGNOSIS — N2581 Secondary hyperparathyroidism of renal origin: Secondary | ICD-10-CM | POA: Diagnosis not present

## 2022-11-08 NOTE — Progress Notes (Deleted)
Cardiology Clinic Note   Patient Name: Timothy [name] Date of Encounter: 11/08/2022  Primary Care Provider:  de [name], Timothy Kos, MD Primary Cardiologist:  Timothy Ishikawa, MD  Patient Profile    Timothy [name] 52 year old male presents to the clinic today for follow-up evaluation of his chronic diastolic CHF and hypertension.  Past Medical History    Past Medical History:  Diagnosis Date   Allergies    Anemia    Blind left eye    CHF (congestive heart failure) (HCC)    Chronic kidney disease    stage 4   Diabetes mellitus without complication (HCC)    Dyspnea    Patient denies SOB since he started dialysis. States he went away after he started dialysis   HLD (hyperlipidemia)    Hypertension    Pneumonia    Past Surgical History:  Procedure Laterality Date   AV FISTULA PLACEMENT Left 09/19/2022   Procedure: ARTERIOVENOUS (AV) FISTULA CREATION;  Surgeon: Timothy Libman, MD;  Location: [facility] OR;  Service: Vascular;  Laterality: Left;   CATARACT EXTRACTION Right    MENISCUS REPAIR Left     Allergies  No Known Allergies  History of Present Illness    Timothy [name] has a PMH of chronic diastolic CHF, hypertensive urgency, acute respiratory failure, pneumonia, hematemesis, type 2 diabetes, CKD stage IV, elevated troponin, and positive D-dimer.  Echocardiogram 5/21 showed normal BiV function, severe LVH, mild MR/AI.  His echocardiogram 5/22 showed normal biventricular function and intermediate diastolic function, moderate LVH and no significant valvular abnormalities.  He was admitted to the hospital 5/19 until 06/24/2020 with pneumonia and acute chronic diastolic CHF.  He presented with a fever, oxygen saturation of 86% on room air, creatinine 2.71, WBCs 12.2.  His chest x-ray showed multi focal pneumonia with concern for hypertension pneumonitis versus pulmonary edema.  He received IV antibiotics and diuresis.  His COVID was negative.  He was  transition to p.o. furosemide and his creatinine on discharge was 2.73.  He was seen in follow-up by Dr. Bjorn [name] on 04/12/2021.  During that time he reported he had an episode of sudden onset of shortness of breath that morning.  He denied chest pain.  He denied headaches.  He denied lightheadedness and syncope.  He had no lower extremity edema and denied palpitations.  He reported compliance with his blood pressure medications.  He had not been checking his blood pressure at home.  He reported having an appointment with neurology later that month.  His blood pressure was noted to be 215/112.  Due to his sudden onset of shortness of breath it was recommended that he be seen in the emergency department.  He declined EMS but agreed to have his girlfriend take him to the emergency department.  He presents to the clinic today for follow-up evaluation and states he feels well.  He does occasionally notice cramps in his calves.  His blood pressure today is significantly improved at 156/72.  We reviewed his medications and secondary causes for hypertension.  I will increase his metoprolol to 100 mg daily.  He reports that he does not take labetalol because it makes him feel sick.  I will check a magnesium level, instructed him that it is okay to take an extra 50 mg of hydralazine for systolic blood pressure greater than 160, will refer to advanced hypertension clinic, and plan follow-up with Dr. Bjorn [name] or me in 3 months.  We will  repeat his echocardiogram as well.  Today he denies chest pain, shortness of breath, lower extremity edema, fatigue, palpitations, melena, hematuria, hemoptysis, diaphoresis, weakness, presyncope, syncope, orthopnea, and PND.   Home Medications    Prior to Admission medications   Medication Sig Start Date End Date Taking? Authorizing Provider  amLODipine (NORVASC) 10 MG tablet Take 10 mg by mouth daily. 10/21/20   [provider]  atorvastatin (LIPITOR) 40 MG tablet Take 40  mg by mouth daily.    [provider]  ENTRESTO 49-51 MG Take 1 tablet by mouth 2 (two) times daily. 01/09/22   [provider]  FARXIGA 10 MG TABS tablet Take 10 mg by mouth daily. 01/09/22   [provider]  furosemide (LASIX) 80 MG tablet Take 80 mg by mouth daily.    [provider]  glimepiride (AMARYL) 4 MG tablet Take 4 mg by mouth daily. 01/12/21   [provider]  hydrALAZINE (APRESOLINE) 100 MG tablet Take 50 mg by mouth 3 (three) times daily. 11/09/20   [provider]  hydrOXYzine (VISTARIL) 25 MG capsule Take 25 mg by mouth at bedtime. 11/01/20   [provider]  insulin glargine (LANTUS SOLOSTAR) 100 UNIT/ML Solostar Pen INJECT 5 UINTS INTO THE SKIN DAILY 01/30/22   de [name], Timothy Kos, MD  Insulin Pen Needle (PEN NEEDLES) 31G X 5 MM MISC 1 each by Does not apply route daily. 08/05/22   de [name], Timothy J, MD  isosorbide mononitrate (IMDUR) 30 MG 24 hr tablet Take 30 mg by mouth daily.    [provider]  labetalol (NORMODYNE) 200 MG tablet Take 400 mg by mouth 2 (two) times daily. 12/12/21   [provider]  levocetirizine (XYZAL) 5 MG tablet Take 5 mg by mouth every evening. 10/24/20   [provider]  meloxicam (MOBIC) 15 MG tablet Take 15 mg by mouth daily. 01/12/21   [provider]  metoprolol succinate (TOPROL-XL) 50 MG 24 hr tablet Take 50 mg by mouth daily. 11/02/20   [provider]  omeprazole (PRILOSEC) 40 MG capsule Take 40 mg by mouth daily. 01/12/21   [provider]    Family History    Family History  Problem Relation Age of Onset   Hypertension Mother    Diabetes Mother    Heart attack Mother    Diabetes Brother    He indicated that his mother is deceased. He indicated that the status of his father is unknown. He indicated that his sister is alive. He indicated that all of his three brothers are alive. He indicated that his maternal grandmother is deceased.  He indicated that his maternal grandfather is deceased. He indicated that his paternal grandmother is deceased. He indicated that his paternal grandfather is deceased. He indicated that his daughter is alive.  Social History    Social History   Socioeconomic History   Marital status: Single    Spouse name: Not on file   Number of children: 1   Years of education: Not on file   Highest education level: Not on file  Occupational History   Occupation: disabled  Tobacco Use   Smoking status: Never   Smokeless tobacco: Never  Vaping Use   Vaping status: Never Used  Substance and Sexual Activity   Alcohol use: Not Currently   Drug use: Not Currently   Sexual activity: Not on file  Other Topics Concern   Not on file  Social History Narrative   Not on  file   Social Determinants of Health   Financial Resource Strain: Not on file  Food Insecurity: Not on file  Transportation Needs: Not on file  Physical Activity: Not on file  Stress: Not on file  Social Connections: Not on file  Intimate Partner Violence: Not on file     Review of Systems    General:  No chills, fever, night sweats or weight changes.  Cardiovascular:  No chest pain, dyspnea on exertion, edema, orthopnea, palpitations, paroxysmal nocturnal dyspnea. Dermatological: No rash, lesions/masses Respiratory: No cough, dyspnea Urologic: No hematuria, dysuria Abdominal:   No nausea, vomiting, diarrhea, bright red blood per rectum, melena, or hematemesis Neurologic:  No visual changes, wkns, changes in mental status. All other systems reviewed and are otherwise negative except as noted above.  Physical Exam    VS:  There were no vitals taken for this visit. , BMI There is no height or weight on file to calculate BMI. GEN: Well nourished, well developed, in no acute distress. HEENT: normal. Neck: Supple, no JVD, carotid bruits, or masses. Cardiac: RRR, no murmurs, rubs, or gallops. No clubbing, cyanosis, edema.   Radials/DP/PT 2+ and equal bilaterally.  Respiratory:  Respirations regular and unlabored, clear to auscultation bilaterally. GI: Soft, nontender, nondistended, BS + x 4. MS: no deformity or atrophy. Skin: warm and dry, no rash. Neuro:  Strength and sensation are intact. Psych: Normal affect.  Accessory Clinical Findings    Recent Labs: 08/21/2022: Magnesium 1.6 11/07/2022: ALT 11; BUN 28; Creatinine, Ser 7.97; Hemoglobin 11.6; Platelets 272.0; Potassium 3.9; Sodium 140   Recent Lipid Panel    Component Value Date/Time   CHOL 204 (H) 10/12/2020 0930   TRIG 84 10/12/2020 0930   HDL 42 10/12/2020 0930   CHOLHDL 4.9 10/12/2020 0930   CHOLHDL 6.4 06/22/2020 1410   VLDL 19 06/22/2020 1410   LDLCALC 147 (H) 10/12/2020 0930         ECG personally reviewed by me today-normal sinus rhythm minimal voltage criteria for LVH T wave abnormality 83 bpm  Echocardiogram 06/22/2020   IMPRESSIONS     1. Left ventricular ejection fraction, by estimation, is 60 to 65%. The  left ventricle has normal function. The left ventricle has no regional  wall motion abnormalities. There is moderate left ventricular hypertrophy.  Left ventricular diastolic  parameters are indeterminate.   2. Right ventricular systolic function is normal. The right ventricular  size is normal. There is normal pulmonary artery systolic pressure. The  estimated right ventricular systolic pressure is 25.1 mmHg.   3. Left atrial size was mildly dilated.   4. The mitral valve is normal in structure. Trivial mitral valve  regurgitation. No evidence of mitral stenosis.   5. The aortic valve is tricuspid. Aortic valve regurgitation is trivial.  No aortic stenosis is present.   6. The inferior vena cava is normal in size with greater than 50%  respiratory variability, suggesting right atrial pressure of 3 mmHg.   FINDINGS   Left Ventricle: Left ventricular ejection fraction, by estimation, is 60  to 65%. The left ventricle  has normal function. The left ventricle has no  regional wall motion abnormalities. The left ventricular internal cavity  size was normal in size. There is   moderate left ventricular hypertrophy. Left ventricular diastolic  parameters are indeterminate.   Right Ventricle: The right ventricular size is normal. No increase in  right ventricular wall thickness. Right ventricular systolic function is  normal. There is normal pulmonary artery systolic  pressure. The tricuspid  regurgitant velocity is 2.35 m/s, and   with an assumed right atrial pressure of 3 mmHg, the estimated right  ventricular systolic pressure is 25.1 mmHg.   Left Atrium: Left atrial size was mildly dilated.   Right Atrium: Right atrial size was normal in size.   Pericardium: There is no evidence of pericardial effusion.   Mitral Valve: The mitral valve is normal in structure. Trivial mitral  valve regurgitation. No evidence of mitral valve stenosis.   Tricuspid Valve: The tricuspid valve is normal in structure. Tricuspid  valve regurgitation is trivial. No evidence of tricuspid stenosis.   Aortic Valve: The aortic valve is tricuspid. Aortic valve regurgitation is  trivial. No aortic stenosis is present.   Pulmonic Valve: The pulmonic valve was normal in structure. Pulmonic valve  regurgitation is trivial. No evidence of pulmonic stenosis.   Aorta: The aortic root is normal in size and structure.   Venous: The inferior vena cava is normal in size with greater than 50%  respiratory variability, suggesting right atrial pressure of 3 mmHg.   IAS/Shunts: The interatrial septum was not well visualized.     Assessment & Plan   1.  Resistant hypertension-BP today 156/72.  Blood pressures chronically elevated in the 190s-200s over 100s. Maintain blood pressure log Continue amlodipine, Entresto, furosemide, hydralazine, Imdur,  Increase  metoprolol to 100 mg daily May take an extra 50 mg of hydralazine as needed  for systolic blood pressure greater than 160 Reviewed secondary causes of hypertension Refer to advanced hypertension clinic  Chronic diastolic CHF-recently with some exertional dyspnea and decreased endurance.  Echocardiogram 5/22 showed normal biventricular function, intermediate diastolic parameters and no significant valvular abnormality. Continue Entresto, metoprolol, hydralazine, Farxiga Heart healthy low-sodium diet Repeat echocardiogram  Hyperlipidemia-LDL 147 on 10/12/20. Continue atorvastatin High-fiber diet Follows with PCP  CKD stage IV-creatinine 11.35 on 08/05/2022.  Baseline creatinine around 4.22 Following with nephrology  Type 2 diabetes-glucose 122 on 08/05/2022. Continue current medical therapy Follows with PCP  Lower extremity cramping-order magnesium level List of magnesium rich foods given  Disposition: Follow-up with advanced hypertension clinic within the next month and Dr. Bjorn [name] in 2-3 months.   Thomasene Ripple. Jahking Lesser NP-C     11/08/2022, 7:05 AM [facility] [address] Office [phone] Fax [phone]    I spent 14*** minutes examining this patient, reviewing medications, and using patient centered shared decision making involving her cardiac care.  Prior to her visit I spent greater than 20 minutes reviewing her past medical history,  medications, and prior cardiac tests.

## 2022-11-11 ENCOUNTER — Encounter: Payer: Self-pay | Admitting: Pulmonary Disease

## 2022-11-11 ENCOUNTER — Ambulatory Visit: Payer: 59 | Admitting: General Practice

## 2022-11-11 ENCOUNTER — Ambulatory Visit (INDEPENDENT_AMBULATORY_CARE_PROVIDER_SITE_OTHER): Payer: 59 | Admitting: Pulmonary Disease

## 2022-11-11 VITALS — BP 171/90 | HR 73 | Ht 66.0 in | Wt 169.8 lb

## 2022-11-11 DIAGNOSIS — R042 Hemoptysis: Secondary | ICD-10-CM

## 2022-11-11 DIAGNOSIS — N2581 Secondary hyperparathyroidism of renal origin: Secondary | ICD-10-CM | POA: Diagnosis not present

## 2022-11-11 DIAGNOSIS — D631 Anemia in chronic kidney disease: Secondary | ICD-10-CM | POA: Diagnosis not present

## 2022-11-11 DIAGNOSIS — Z992 Dependence on renal dialysis: Secondary | ICD-10-CM | POA: Diagnosis not present

## 2022-11-11 DIAGNOSIS — E1122 Type 2 diabetes mellitus with diabetic chronic kidney disease: Secondary | ICD-10-CM | POA: Diagnosis not present

## 2022-11-11 DIAGNOSIS — R52 Pain, unspecified: Secondary | ICD-10-CM | POA: Diagnosis not present

## 2022-11-11 DIAGNOSIS — D689 Coagulation defect, unspecified: Secondary | ICD-10-CM | POA: Diagnosis not present

## 2022-11-11 DIAGNOSIS — N186 End stage renal disease: Secondary | ICD-10-CM | POA: Diagnosis not present

## 2022-11-11 NOTE — Patient Instructions (Signed)
VISIT SUMMARY:  During your visit, we discussed your ongoing issues of shortness of breath and coughing up blood, your kidney disease, and your high blood pressure and diabetes. We have decided to order a CT scan of your chest to better understand the cause of your symptoms. We will review the results in 1-2 months and decide on the next steps. Your kidney disease, high blood pressure, and diabetes are currently stable, and we will continue with your current treatment plans for these conditions.  YOUR PLAN:  -COUGHING UP BLOOD: This is when you cough up blood or blood-streaked mucus from the lungs. We will order a CT scan of your chest to look for any abnormalities that might be causing this. If the CT scan shows anything unusual, we may need to do a bronchoscopy, which is a procedure to look inside your lungs.  -KIDNEY DISEASE: This is a condition where your kidneys are not working as well as they should. We will continue with your current treatment plan, which includes dialysis.  -HIGH BLOOD PRESSURE AND DIABETES: These are conditions that affect your heart and blood vessels and your body's ability to regulate sugar, respectively. We will continue with your current treatment plans for these conditions.  INSTRUCTIONS:  Please schedule a CT scan of your chest as soon as possible. We will review the results in 1-2 months. Continue with your current treatment plans for your kidney disease, high blood pressure, and diabetes. If you have any new or worsening symptoms, please contact the office immediately.

## 2022-11-11 NOTE — Progress Notes (Signed)
Timothy [name]    [id number]    1970/12/20  Primary Care Physician:[name], [name], MD  Referring Physician: Napoleon Form, MD [address] [place],  [place] [address]  Chief complaint:  Cough hemoptysis  HPI: 52 y.o. who  has a past medical history of Allergies, Anemia, Blind left eye, CHF (congestive heart failure) (HCC), Chronic kidney disease, Diabetes mellitus without complication (HCC), Dyspnea, HLD (hyperlipidemia), Hypertension, and Pneumonia.   Discussed the use of AI scribe software for clinical note transcription with the patient, who gave verbal consent to proceed.  The patient, with a history of renal disease requiring dialysis, diabetes, and hypertension, presents with a year-long history of random episodes of shortness of breath and hemoptysis. The episodes occur randomly, often after eating or drinking milk, and are associated with a cough and gag reflex. The hemoptysis is described as streaks and specks of blood in the sputum. He denies any other respiratory symptoms such as mucus production or wheezing. He recently saw GI and is scheduled for EGD, colonoscopy on 12/05/2022  The patient has a history of pneumonia for which he was hospitalized twice approximately two and a half years ago. He denies any history of smoking or family history of lung disease. 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      Pets: Dog Occupation: Disabled Exposures: No exposures Smoking history:Never smoker Travel history: No significant travel history Relevant family history: No family history of lung disease  Outpatient Encounter Medications as of 11/11/2022  Medication Sig   amLODipine (NORVASC) 10 MG tablet Take 10 mg by mouth daily.   carvedilol (COREG) 25 MG tablet Take 25 mg by mouth 2 (two) times daily.   ENTRESTO 49-51 MG Take 1 tablet by mouth 2 (two) times daily.   FARXIGA 10 MG TABS tablet Take 10 mg by mouth daily.   furosemide (LASIX) 80 MG  tablet Take 80 mg by mouth daily.   glimepiride (AMARYL) 4 MG tablet Take 4 mg by mouth daily.   hydrALAZINE (APRESOLINE) 100 MG tablet Take 50 mg by mouth 3 (three) times daily. MAY TAKE EXTRA 50MG  FOR SBP>160   hydrOXYzine (VISTARIL) 25 MG capsule Take 25 mg by mouth at bedtime.   insulin glargine (LANTUS SOLOSTAR) 100 UNIT/ML Solostar Pen INJECT 5 UINTS INTO THE SKIN DAILY   Insulin Pen Needle (PEN NEEDLES) 31G X 5 MM MISC 1 each by Does not apply route daily.   isosorbide mononitrate (IMDUR) 30 MG 24 hr tablet Take 30 mg by mouth daily.   losartan (COZAAR) 25 MG tablet Take 25 mg by mouth daily.   meloxicam (MOBIC) 15 MG tablet Take 15 mg by mouth daily.   metolazone (ZAROXOLYN) 2.5 MG tablet Take 2.5 mg by mouth daily.   metoprolol succinate (TOPROL-XL) 100 MG 24 hr tablet Take 1 tablet (100 mg total) by mouth daily.   omeprazole (PRILOSEC) 40 MG capsule Take 40 mg by mouth daily.   OZEMPIC, 0.25 OR 0.5 MG/DOSE, 2 MG/3ML SOPN Inject into the skin. 09/17/22 Is not taking   potassium chloride SA (KLOR-CON M) 20 MEQ tablet Take 20 mEq by mouth daily.   spironolactone (ALDACTONE) 25 MG tablet Take 25 mg by mouth daily.   torsemide (DEMADEX) 20 MG tablet Take 20 mg by mouth 2 (two) times daily.   Continuous Glucose Sensor (FREESTYLE LIBRE 3 SENSOR) MISC See admin instructions. 09/17/22 - does not have (Patient not taking:  Reported on 11/07/2022)   Facility-Administered Encounter Medications as of 11/11/2022  Medication   0.9 %  sodium chloride infusion   sodium chloride flush (NS) 0.9 % injection 3 mL   sodium chloride flush (NS) 0.9 % injection 3 mL    Allergies as of 11/11/2022   (No Known Allergies)    Past Medical History:  Diagnosis Date   Allergies    Anemia    Blind left eye    CHF (congestive heart failure) (HCC)    Chronic kidney disease    stage 4   Diabetes mellitus without complication (HCC)    Dyspnea    Patient denies SOB since he started dialysis. States he went away  after he started dialysis   HLD (hyperlipidemia)    Hypertension    Pneumonia     Past Surgical History:  Procedure Laterality Date   AV FISTULA PLACEMENT Left 09/19/2022   Procedure: ARTERIOVENOUS (AV) FISTULA CREATION;  Surgeon: Nada Libman, MD;  Location: [facility] OR;  Service: Vascular;  Laterality: Left;   CATARACT EXTRACTION Right    MENISCUS REPAIR Left     Family History  Problem Relation Age of Onset   Hypertension Mother    Diabetes Mother    Heart attack Mother    Diabetes Brother     Social History   Socioeconomic History   Marital status: Single    Spouse name: Not on file   Number of children: 1   Years of education: Not on file   Highest education level: Not on file  Occupational History   Occupation: disabled  Tobacco Use   Smoking status: Never   Smokeless tobacco: Never  Vaping Use   Vaping status: Never Used  Substance and Sexual Activity   Alcohol use: Not Currently   Drug use: Not Currently   Sexual activity: Not on file  Other Topics Concern   Not on file  Social History Narrative   Not on file   Social Determinants of Health   Financial Resource Strain: Not on file  Food Insecurity: Not on file  Transportation Needs: Not on file  Physical Activity: Not on file  Stress: Not on file  Social Connections: Not on file  Intimate Partner Violence: Not on file    Review of systems: Review of Systems  Constitutional: Negative for fever and chills.  HENT: Negative.   Eyes: Negative for blurred vision.  Respiratory: as per HPI  Cardiovascular: Negative for chest pain and palpitations.  Gastrointestinal: Negative for vomiting, diarrhea, blood per rectum. Genitourinary: Negative for dysuria, urgency, frequency and hematuria.  Musculoskeletal: Negative for myalgias, back pain and joint pain.  Skin: Negative for itching and rash.  Neurological: Negative for dizziness, tremors, focal weakness, seizures and loss of consciousness.   Endo/Heme/Allergies: Negative for environmental allergies.  Psychiatric/Behavioral: Negative for depression, suicidal ideas and hallucinations.  All other systems reviewed and are negative.  Physical Exam: Blood pressure (!) 171/90, pulse 73, height 5\' 6"  (1.676 m), weight 169 lb 12.8 oz (77 kg), SpO2 96%. Gen:      No acute distress HEENT:  EOMI, sclera anicteric Neck:     No masses; no thyromegaly Lungs:    Clear to auscultation bilaterally; normal respiratory effort CV:         Regular rate and rhythm; no murmurs Abd:      + bowel sounds; soft, non-tender; no palpable masses, no distension Ext:    No edema; adequate peripheral perfusion Skin:  Warm and dry; no rash Neuro: alert and oriented x 3 Psych: normal mood and affect  Data Reviewed: Imaging:  PFTs:  Labs:  Assessment and Plan Hemoptysis Intermittent cough with blood streaked sputum for approximately 1 year. No associated wheezing, mucus production, or other respiratory symptoms. No history of blood thinners or antiplatelet agents. History of pneumonia 2.5 years ago. No smoking history. Physical exam unremarkable.  -Order CT chest to evaluate for potential lung pathology. -Consider bronchoscopy if CT chest reveals abnormal findings. -Follow-up in 1-2 months to review CT results and determine further management.  End Stage Renal Disease On dialysis for 2.5 months. -Continue current management.  Hypertension and Diabetes No acute issues discussed. -Continue current management.   Recommendations: CT chest  Chilton Greathouse MD [facility] 11/11/2022, 3:02 PM  CC: Napoleon Form, MD

## 2022-11-12 ENCOUNTER — Ambulatory Visit: Payer: Medicaid Other | Admitting: Gastroenterology

## 2022-11-13 DIAGNOSIS — Z992 Dependence on renal dialysis: Secondary | ICD-10-CM | POA: Diagnosis not present

## 2022-11-13 DIAGNOSIS — D689 Coagulation defect, unspecified: Secondary | ICD-10-CM | POA: Diagnosis not present

## 2022-11-13 DIAGNOSIS — R52 Pain, unspecified: Secondary | ICD-10-CM | POA: Diagnosis not present

## 2022-11-13 DIAGNOSIS — D631 Anemia in chronic kidney disease: Secondary | ICD-10-CM | POA: Diagnosis not present

## 2022-11-13 DIAGNOSIS — N186 End stage renal disease: Secondary | ICD-10-CM | POA: Diagnosis not present

## 2022-11-13 DIAGNOSIS — N2581 Secondary hyperparathyroidism of renal origin: Secondary | ICD-10-CM | POA: Diagnosis not present

## 2022-11-13 DIAGNOSIS — E1122 Type 2 diabetes mellitus with diabetic chronic kidney disease: Secondary | ICD-10-CM | POA: Diagnosis not present

## 2022-11-15 DIAGNOSIS — D689 Coagulation defect, unspecified: Secondary | ICD-10-CM | POA: Diagnosis not present

## 2022-11-15 DIAGNOSIS — N186 End stage renal disease: Secondary | ICD-10-CM | POA: Diagnosis not present

## 2022-11-15 DIAGNOSIS — N2581 Secondary hyperparathyroidism of renal origin: Secondary | ICD-10-CM | POA: Diagnosis not present

## 2022-11-15 DIAGNOSIS — E1122 Type 2 diabetes mellitus with diabetic chronic kidney disease: Secondary | ICD-10-CM | POA: Diagnosis not present

## 2022-11-15 DIAGNOSIS — R52 Pain, unspecified: Secondary | ICD-10-CM | POA: Diagnosis not present

## 2022-11-15 DIAGNOSIS — D631 Anemia in chronic kidney disease: Secondary | ICD-10-CM | POA: Diagnosis not present

## 2022-11-15 DIAGNOSIS — Z992 Dependence on renal dialysis: Secondary | ICD-10-CM | POA: Diagnosis not present

## 2022-11-18 DIAGNOSIS — D689 Coagulation defect, unspecified: Secondary | ICD-10-CM | POA: Diagnosis not present

## 2022-11-18 DIAGNOSIS — D631 Anemia in chronic kidney disease: Secondary | ICD-10-CM | POA: Diagnosis not present

## 2022-11-18 DIAGNOSIS — E1122 Type 2 diabetes mellitus with diabetic chronic kidney disease: Secondary | ICD-10-CM | POA: Diagnosis not present

## 2022-11-18 DIAGNOSIS — Z992 Dependence on renal dialysis: Secondary | ICD-10-CM | POA: Diagnosis not present

## 2022-11-18 DIAGNOSIS — R52 Pain, unspecified: Secondary | ICD-10-CM | POA: Diagnosis not present

## 2022-11-18 DIAGNOSIS — N2581 Secondary hyperparathyroidism of renal origin: Secondary | ICD-10-CM | POA: Diagnosis not present

## 2022-11-18 DIAGNOSIS — N186 End stage renal disease: Secondary | ICD-10-CM | POA: Diagnosis not present

## 2022-11-19 ENCOUNTER — Other Ambulatory Visit: Payer: Self-pay

## 2022-11-19 ENCOUNTER — Ambulatory Visit (HOSPITAL_COMMUNITY)
Admission: RE | Disposition: A | Discharge status: Home / Self Care | Payer: Self-pay | Source: Home / Self Care | Attending: Surgery

## 2022-11-19 ENCOUNTER — Ambulatory Visit (HOSPITAL_COMMUNITY)
Admission: RE | Admit: 2022-11-19 | Discharge: 2022-11-19 | Disposition: A | Discharge status: Home / Self Care | Payer: 59 | Attending: Surgery | Admitting: Surgery

## 2022-11-19 ENCOUNTER — Encounter: Payer: Self-pay | Admitting: Gastroenterology

## 2022-11-19 DIAGNOSIS — Y832 Surgical operation with anastomosis, bypass or graft as the cause of abnormal reaction of the patient, or of later complication, without mention of misadventure at the time of the procedure: Secondary | ICD-10-CM | POA: Insufficient documentation

## 2022-11-19 DIAGNOSIS — Z992 Dependence on renal dialysis: Secondary | ICD-10-CM | POA: Insufficient documentation

## 2022-11-19 DIAGNOSIS — N186 End stage renal disease: Secondary | ICD-10-CM | POA: Insufficient documentation

## 2022-11-19 DIAGNOSIS — T82898A Other specified complication of vascular prosthetic devices, implants and grafts, initial encounter: Secondary | ICD-10-CM | POA: Insufficient documentation

## 2022-11-19 DIAGNOSIS — N185 Chronic kidney disease, stage 5: Secondary | ICD-10-CM

## 2022-11-19 HISTORY — PX: A/V FISTULAGRAM: CATH118298

## 2022-11-19 HISTORY — PX: PERIPHERAL VASCULAR INTERVENTION: CATH118257

## 2022-11-19 LAB — GLUCOSE, CAPILLARY: Glucose-Capillary: 124 mg/dL — ABNORMAL HIGH (ref 70–99)

## 2022-11-19 LAB — POCT I-STAT, CHEM 8
BUN: 28 mg/dL — ABNORMAL HIGH (ref 6–20)
Calcium, Ion: 1.06 mmol/L — ABNORMAL LOW (ref 1.15–1.40)
Chloride: 101 mmol/L (ref 98–111)
Creatinine, Ser: 8.9 mg/dL — ABNORMAL HIGH (ref 0.61–1.24)
Glucose, Bld: 132 mg/dL — ABNORMAL HIGH (ref 70–99)
HCT: 38 % — ABNORMAL LOW (ref 39.0–52.0)
Hemoglobin: 12.9 g/dL — ABNORMAL LOW (ref 13.0–17.0)
Potassium: 4 mmol/L (ref 3.5–5.1)
Sodium: 141 mmol/L (ref 135–145)
TCO2: 26 mmol/L (ref 22–32)

## 2022-11-19 SURGERY — A/V FISTULAGRAM
Anesthesia: LOCAL | Laterality: Left

## 2022-11-19 MED ORDER — HEPARIN (PORCINE) IN NACL 1000-0.9 UT/500ML-% IV SOLN
INTRAVENOUS | Status: DC | PRN
  Administered 2022-11-19: 500 mL

## 2022-11-19 MED ORDER — FENTANYL CITRATE (PF) 100 MCG/2ML IJ SOLN
INTRAMUSCULAR | Status: AC
  Filled 2022-11-19: qty 2

## 2022-11-19 MED ORDER — MIDAZOLAM HCL 2 MG/2ML IJ SOLN
INTRAMUSCULAR | Status: DC | PRN
  Administered 2022-11-19 (×2): 1 mg via INTRAVENOUS

## 2022-11-19 MED ORDER — FENTANYL CITRATE (PF) 100 MCG/2ML IJ SOLN
INTRAMUSCULAR | Status: DC | PRN
  Administered 2022-11-19 (×2): 25 ug via INTRAVENOUS

## 2022-11-19 MED ORDER — HEPARIN SODIUM (PORCINE) 1000 UNIT/ML IJ SOLN
INTRAMUSCULAR | Status: AC
  Filled 2022-11-19: qty 10

## 2022-11-19 MED ORDER — LIDOCAINE HCL (PF) 1 % IJ SOLN
INTRAMUSCULAR | Status: AC
  Filled 2022-11-19: qty 30

## 2022-11-19 MED ORDER — OXYCODONE HCL 5 MG PO TABS
10.0000 mg | ORAL_TABLET | ORAL | Status: DC | PRN
  Administered 2022-11-19: 10 mg via ORAL
  Filled 2022-11-19: qty 2

## 2022-11-19 MED ORDER — MIDAZOLAM HCL 2 MG/2ML IJ SOLN
INTRAMUSCULAR | Status: AC
  Filled 2022-11-19: qty 2

## 2022-11-19 MED ORDER — HEPARIN SODIUM (PORCINE) 1000 UNIT/ML IJ SOLN
INTRAMUSCULAR | Status: DC | PRN
  Administered 2022-11-19: 3000 [IU] via INTRAVENOUS

## 2022-11-19 SURGICAL SUPPLY — 17 items
BAG SNAP BAND KOVER 36X36 (MISCELLANEOUS) ×2 IMPLANT
CATH ANGIO 5F BER2 65CM (CATHETERS) IMPLANT
CATH NAVICROSS ANG 65CM (CATHETERS) IMPLANT
CATHETER NAVICROSS ANG 65CM (CATHETERS) ×2
COIL NESTER 14X6 (Embolic) IMPLANT
COVER DOME SNAP 22 D (MISCELLANEOUS) ×2 IMPLANT
DEVICE ONE SNARE 10MM (MISCELLANEOUS) IMPLANT
GUIDEWIRE ANGLED .035X150CM (WIRE) IMPLANT
KIT MICROPUNCTURE NIT STIFF (SHEATH) IMPLANT
KIT SYRINGE INJ CVI SPIKEX1 (MISCELLANEOUS) IMPLANT
SET ATX-X65L (MISCELLANEOUS) IMPLANT
SHEATH PINNACLE R/O II 6F 4CM (SHEATH) IMPLANT
SHEATH PROBE COVER 6X72 (BAG) ×2 IMPLANT
STOPCOCK MORSE 400PSI 3WAY (MISCELLANEOUS) ×2 IMPLANT
TRAY PV CATH (CUSTOM PROCEDURE TRAY) ×2 IMPLANT
TUBING CIL FLEX 10 FLL-RA (TUBING) ×2 IMPLANT
WIRE BENTSON .035X145CM (WIRE) IMPLANT

## 2022-11-19 NOTE — Interval H&P Note (Signed)
History and Physical Interval Note:  11/19/2022 7:37 AM  Timothy [name]  has presented today for surgery, with the diagnosis of instage renal.  The various methods of treatment have been discussed with the patient and family. After consideration of risks, benefits and other options for treatment, the patient has consented to  Procedure(s): A/V Fistulagram (Left) as a surgical intervention.  The patient's history has been reviewed, patient examined, no change in status, stable for surgery.  I have reviewed the patient's chart and labs.  Questions were answered to the patient's satisfaction.     [name]

## 2022-11-19 NOTE — Op Note (Signed)
Patient name: Timothy [name] MRN: [id number] DOB: 02-Jul-1970 Sex: male  11/19/2022 Pre-operative Diagnosis: Nonmaturing left arm fistula Post-operative diagnosis:  Same Surgeon:  [name] Procedure Performed:  1.  Ultrasound-guided access, left arm fistula  2.  Conscious sedation, 47 minutes  3.  Fistulogram  4.  Coil embolization, venous branch  5.  Removal of of coil    Indications: This is a 52 year old gentleman status post left arm fistula that has not matured.  He comes in today for further evaluation and possible treatment.  Procedure:  The patient was identified in the holding area and taken to room 8.  The patient was then placed supine on the table and prepped and draped in the usual sterile fashion.  A time out was called.  Conscious sedation was administered with the use of IV fentanyl and Versed under continuous physician and nurse monitoring.  Heart rate, blood pressure, and oxygen saturations were continuously monitored.  Total sedation time was 47 minutes ultrasound was used to evaluate the fistula.  The vein was patent and compressible.  A digital ultrasound image was acquired.  The fistula was then accessed under ultrasound guidance using a micropuncture needle.  An 018 wire was then asvanced without resistance and a micropuncture sheath was placed.  Contrast injections were then performed through the sheath.  Findings: Central venous system is widely patent.  The arterial venous anastomosis is widely patent.  There are multiple competing branches in the upper arm   Intervention: After the above images were acquired the decision was made to proceed and intervene on the largest competing branch.  A 6 French sheath was placed.  Next a Berenstein 2 catheter was used to select the largest competing branch near the antecubital crease.  I then inserted a 035 Glidewire.  I replaced the Berenstein catheter as I could not get this catheter to take the turn in the  competing branch.  A Nava cross catheter was then placed.  I then selected a 6 x 14 Nester coil.  The competing branch was coiled however because of the tortuosity, the distal 1 cm of the coil ended up in the main fistula.  I then selected a 10 mm snare to snare the protruding portion of the coil.  This was done with a Berenstein catheter.  I then tried to reposition the coil into the competing branch, however this was unsuccessful and so I ended up removing the entire Nester coil.  During the process of manipulating the coil, I gave 3000 units of heparin.  I then performed completion angiogram and the competing branch had spasm down and was not visible.  I removed the sheath with a Monocryl for closure.  Impression:  #1  Widely patent central venous system  #2  No evidence of arterial venous anastomotic stenosis  #3  Multiple competing branches in the upper arm.  The largest was attempted to be coil embolized however I could not get the coil to stay packed into the branch and ultimately had to remove it.  Completion imaging showed that the largest competing branch had disappeared.  This could either be spasm or thrombosis.  Regardless, I would proceed with utilization of his fistula within approximately 2 weeks.  If he continues to have difficulty, he will need to be scheduled for branch ligation   V. [name], M.D., [facility] [place] Office: [phone] Pager:  [phone]

## 2022-11-20 ENCOUNTER — Encounter (HOSPITAL_COMMUNITY): Payer: Self-pay | Admitting: Surgery

## 2022-11-20 ENCOUNTER — Telehealth (HOSPITAL_BASED_OUTPATIENT_CLINIC_OR_DEPARTMENT_OTHER): Payer: Self-pay | Admitting: Family Medicine

## 2022-11-20 DIAGNOSIS — Z992 Dependence on renal dialysis: Secondary | ICD-10-CM | POA: Diagnosis not present

## 2022-11-20 DIAGNOSIS — D631 Anemia in chronic kidney disease: Secondary | ICD-10-CM | POA: Diagnosis not present

## 2022-11-20 DIAGNOSIS — D689 Coagulation defect, unspecified: Secondary | ICD-10-CM | POA: Diagnosis not present

## 2022-11-20 DIAGNOSIS — E1122 Type 2 diabetes mellitus with diabetic chronic kidney disease: Secondary | ICD-10-CM | POA: Diagnosis not present

## 2022-11-20 DIAGNOSIS — N2581 Secondary hyperparathyroidism of renal origin: Secondary | ICD-10-CM | POA: Diagnosis not present

## 2022-11-20 DIAGNOSIS — N186 End stage renal disease: Secondary | ICD-10-CM | POA: Diagnosis not present

## 2022-11-20 DIAGNOSIS — R52 Pain, unspecified: Secondary | ICD-10-CM | POA: Diagnosis not present

## 2022-11-20 MED FILL — Lidocaine HCl Local Preservative Free (PF) Inj 1%: INTRAMUSCULAR | Qty: 30 | Status: AC

## 2022-11-20 NOTE — Telephone Encounter (Signed)
Ann with [facility] is calling states that they recevied the referral for BP Monitor, and will be sending that out to the patient

## 2022-11-22 DIAGNOSIS — N186 End stage renal disease: Secondary | ICD-10-CM | POA: Diagnosis not present

## 2022-11-22 DIAGNOSIS — N2581 Secondary hyperparathyroidism of renal origin: Secondary | ICD-10-CM | POA: Diagnosis not present

## 2022-11-22 DIAGNOSIS — D631 Anemia in chronic kidney disease: Secondary | ICD-10-CM | POA: Diagnosis not present

## 2022-11-22 DIAGNOSIS — Z992 Dependence on renal dialysis: Secondary | ICD-10-CM | POA: Diagnosis not present

## 2022-11-22 DIAGNOSIS — D689 Coagulation defect, unspecified: Secondary | ICD-10-CM | POA: Diagnosis not present

## 2022-11-22 DIAGNOSIS — E1122 Type 2 diabetes mellitus with diabetic chronic kidney disease: Secondary | ICD-10-CM | POA: Diagnosis not present

## 2022-11-22 DIAGNOSIS — R52 Pain, unspecified: Secondary | ICD-10-CM | POA: Diagnosis not present

## 2022-11-25 ENCOUNTER — Ambulatory Visit
Admission: RE | Admit: 2022-11-25 | Discharge: 2022-11-25 | Disposition: A | Discharge status: Home / Self Care | Payer: 59 | Source: Ambulatory Visit | Attending: Pulmonary Disease

## 2022-11-25 DIAGNOSIS — R52 Pain, unspecified: Secondary | ICD-10-CM | POA: Diagnosis not present

## 2022-11-25 DIAGNOSIS — D689 Coagulation defect, unspecified: Secondary | ICD-10-CM | POA: Diagnosis not present

## 2022-11-25 DIAGNOSIS — R042 Hemoptysis: Secondary | ICD-10-CM

## 2022-11-25 DIAGNOSIS — D631 Anemia in chronic kidney disease: Secondary | ICD-10-CM | POA: Diagnosis not present

## 2022-11-25 DIAGNOSIS — Z992 Dependence on renal dialysis: Secondary | ICD-10-CM | POA: Diagnosis not present

## 2022-11-25 DIAGNOSIS — N186 End stage renal disease: Secondary | ICD-10-CM | POA: Diagnosis not present

## 2022-11-25 DIAGNOSIS — E1122 Type 2 diabetes mellitus with diabetic chronic kidney disease: Secondary | ICD-10-CM | POA: Diagnosis not present

## 2022-11-25 DIAGNOSIS — N2581 Secondary hyperparathyroidism of renal origin: Secondary | ICD-10-CM | POA: Diagnosis not present

## 2022-11-25 DIAGNOSIS — J8410 Pulmonary fibrosis, unspecified: Secondary | ICD-10-CM | POA: Diagnosis not present

## 2022-11-26 ENCOUNTER — Telehealth (HOSPITAL_BASED_OUTPATIENT_CLINIC_OR_DEPARTMENT_OTHER): Payer: Self-pay | Admitting: Family Medicine

## 2022-11-26 NOTE — Telephone Encounter (Signed)
[facility] is calling to see if you received the blood pressure order

## 2022-11-26 NOTE — Telephone Encounter (Signed)
Can you see if you have this order?

## 2022-11-27 DIAGNOSIS — R52 Pain, unspecified: Secondary | ICD-10-CM | POA: Diagnosis not present

## 2022-11-27 DIAGNOSIS — D689 Coagulation defect, unspecified: Secondary | ICD-10-CM | POA: Diagnosis not present

## 2022-11-27 DIAGNOSIS — N186 End stage renal disease: Secondary | ICD-10-CM | POA: Diagnosis not present

## 2022-11-27 DIAGNOSIS — N2581 Secondary hyperparathyroidism of renal origin: Secondary | ICD-10-CM | POA: Diagnosis not present

## 2022-11-27 DIAGNOSIS — E1122 Type 2 diabetes mellitus with diabetic chronic kidney disease: Secondary | ICD-10-CM | POA: Diagnosis not present

## 2022-11-27 DIAGNOSIS — Z992 Dependence on renal dialysis: Secondary | ICD-10-CM | POA: Diagnosis not present

## 2022-11-27 DIAGNOSIS — D631 Anemia in chronic kidney disease: Secondary | ICD-10-CM | POA: Diagnosis not present

## 2022-11-28 ENCOUNTER — Encounter (HOSPITAL_COMMUNITY): Payer: Self-pay | Admitting: Gastroenterology

## 2022-11-28 DIAGNOSIS — E113511 Type 2 diabetes mellitus with proliferative diabetic retinopathy with macular edema, right eye: Secondary | ICD-10-CM | POA: Diagnosis not present

## 2022-11-28 LAB — HM DIABETES EYE EXAM

## 2022-11-28 NOTE — Progress Notes (Signed)
Pre op call eval Name:Timothy [name]   PCP-Raymond [name] MD Cardiologist-Dr.Schumann also sees cards at [facility] through transplant team Pulmonolgist-Dr. Isaiah Serge  EKG-08/12/22 Echo-09/09/22 Cath-n/a Stress-11/18/22 ([facility]) ICD/PM-n/a Blood thinner-n/a GLP-1- n/a- confirmed not on Ozempic or like agents  Hx:CHF,HTN,ESRD dialysis MWF, DM. Patient last saw [name] 7/8 where they recommended an echo and f/u 1 month, echo done on 8/5. Also sees a cardiologist at [facility] through their transplant team, last seen 10/14 where they did a stress test. Patient also sees pulm. last seen 11/11/22 for cough, ordered CT and f/u 1-2 months, ct done 10/21. Patient reports cough is better, but denies SOB, chest pain, no equipment use.  Anesthesia Review: Yes

## 2022-11-28 NOTE — Telephone Encounter (Signed)
Called [facility] to let them know we have not received the order. They will resend the order

## 2022-11-29 DIAGNOSIS — D631 Anemia in chronic kidney disease: Secondary | ICD-10-CM | POA: Diagnosis not present

## 2022-11-29 DIAGNOSIS — R52 Pain, unspecified: Secondary | ICD-10-CM | POA: Diagnosis not present

## 2022-11-29 DIAGNOSIS — D689 Coagulation defect, unspecified: Secondary | ICD-10-CM | POA: Diagnosis not present

## 2022-11-29 DIAGNOSIS — Z992 Dependence on renal dialysis: Secondary | ICD-10-CM | POA: Diagnosis not present

## 2022-11-29 DIAGNOSIS — N186 End stage renal disease: Secondary | ICD-10-CM | POA: Diagnosis not present

## 2022-11-29 DIAGNOSIS — N2581 Secondary hyperparathyroidism of renal origin: Secondary | ICD-10-CM | POA: Diagnosis not present

## 2022-11-29 DIAGNOSIS — E1122 Type 2 diabetes mellitus with diabetic chronic kidney disease: Secondary | ICD-10-CM | POA: Diagnosis not present

## 2022-12-02 ENCOUNTER — Other Ambulatory Visit: Payer: Self-pay

## 2022-12-02 DIAGNOSIS — E1122 Type 2 diabetes mellitus with diabetic chronic kidney disease: Secondary | ICD-10-CM | POA: Diagnosis not present

## 2022-12-02 DIAGNOSIS — D689 Coagulation defect, unspecified: Secondary | ICD-10-CM | POA: Diagnosis not present

## 2022-12-02 DIAGNOSIS — D631 Anemia in chronic kidney disease: Secondary | ICD-10-CM | POA: Diagnosis not present

## 2022-12-02 DIAGNOSIS — R52 Pain, unspecified: Secondary | ICD-10-CM | POA: Diagnosis not present

## 2022-12-02 DIAGNOSIS — E1165 Type 2 diabetes mellitus with hyperglycemia: Secondary | ICD-10-CM

## 2022-12-02 DIAGNOSIS — N186 End stage renal disease: Secondary | ICD-10-CM | POA: Diagnosis not present

## 2022-12-02 DIAGNOSIS — N2581 Secondary hyperparathyroidism of renal origin: Secondary | ICD-10-CM | POA: Diagnosis not present

## 2022-12-02 DIAGNOSIS — Z992 Dependence on renal dialysis: Secondary | ICD-10-CM | POA: Diagnosis not present

## 2022-12-02 NOTE — Telephone Encounter (Signed)
This is actually [facility] --they called and had everything they needed -=except the last office notes,.  I have sent this via fax [phone] or [phone]

## 2022-12-03 ENCOUNTER — Encounter (HOSPITAL_BASED_OUTPATIENT_CLINIC_OR_DEPARTMENT_OTHER): Payer: Self-pay | Admitting: *Deleted

## 2022-12-03 ENCOUNTER — Ambulatory Visit (HOSPITAL_BASED_OUTPATIENT_CLINIC_OR_DEPARTMENT_OTHER): Payer: 59 | Admitting: Family Medicine

## 2022-12-03 ENCOUNTER — Encounter (HOSPITAL_BASED_OUTPATIENT_CLINIC_OR_DEPARTMENT_OTHER): Payer: Self-pay | Admitting: Family Medicine

## 2022-12-03 VITALS — BP 138/84 | HR 67 | Ht 66.0 in | Wt 170.4 lb

## 2022-12-03 DIAGNOSIS — Z794 Long term (current) use of insulin: Secondary | ICD-10-CM

## 2022-12-03 DIAGNOSIS — L84 Corns and callosities: Secondary | ICD-10-CM | POA: Insufficient documentation

## 2022-12-03 DIAGNOSIS — S8991XD Unspecified injury of right lower leg, subsequent encounter: Secondary | ICD-10-CM | POA: Diagnosis not present

## 2022-12-03 DIAGNOSIS — E1122 Type 2 diabetes mellitus with diabetic chronic kidney disease: Secondary | ICD-10-CM | POA: Diagnosis not present

## 2022-12-03 DIAGNOSIS — N184 Chronic kidney disease, stage 4 (severe): Secondary | ICD-10-CM | POA: Diagnosis not present

## 2022-12-03 NOTE — Progress Notes (Signed)
    Procedures performed today:    None.  Independent interpretation of notes and tests performed by another provider:   None.  Brief History, Exam, Impression, and Recommendations:    BP 138/84 (BP Location: Right Arm, Patient Position: Sitting, Cuff Size: Normal)   Pulse 67   Ht 5\' 6"  (1.676 m)   Wt 170 lb 6.4 oz (77.3 kg)   SpO2 98%   BMI 27.50 kg/m   Knee injury, right, subsequent encounter Assessment & Plan: We previously have attempted to obtain initial x-ray imaging, however patient has not had this completed as of yet.  He continues to have issues with right knee in May again reviewed options today.  We will proceed with referral to orthopedic surgeon for further evaluation given history of injury and ongoing issues with right knee including instability, pain, concern for remote patellar injury  Orders: -     Ambulatory referral to Orthopedic Surgery  Foot callus Assessment & Plan: Patient with observed foot callus in the setting of diabetic peripheral neuropathy.  Given appearance and concern, recommend further evaluation and follow-up with podiatrist for management, patient in agreement, referral placed today  Orders: -     Ambulatory referral to Podiatry  Type 2 diabetes mellitus with stage 4 chronic kidney disease, with long-term current use of insulin (HCC) Assessment & Plan: Patient reports that fasting blood sugars are in the low 100s.  He reports continuing with glimepiride as well as long-acting insulin, insulin glargine.  He is in need of pen needles for insulin administration.  Denies any symptoms of hyper or hypoglycemia.  Has not had recent hemoglobin A1c check.  Recent hemoglobin A1c has been better controlled, has had notable elevation in the past.  We previously provided a referral to an endocrinologist and he does have upcoming appointment scheduled.  Recommend continuing with scheduled appointment to establish with endocrinologist locally. As above,  given underlying neuropathy and associated foot callus, referral to podiatry placed today  Orders: -     Ambulatory referral to Podiatry  Return in about 3 months (around 03/05/2023) for diabetes, hypertension.   ___________________________________________ Timothy Steppe [name], MD, ABFM, CAQSM Primary Care and Sports Medicine [facility]

## 2022-12-04 ENCOUNTER — Encounter (HOSPITAL_BASED_OUTPATIENT_CLINIC_OR_DEPARTMENT_OTHER): Payer: Self-pay | Admitting: *Deleted

## 2022-12-04 DIAGNOSIS — N2581 Secondary hyperparathyroidism of renal origin: Secondary | ICD-10-CM | POA: Diagnosis not present

## 2022-12-04 DIAGNOSIS — D689 Coagulation defect, unspecified: Secondary | ICD-10-CM | POA: Diagnosis not present

## 2022-12-04 DIAGNOSIS — N186 End stage renal disease: Secondary | ICD-10-CM | POA: Diagnosis not present

## 2022-12-04 DIAGNOSIS — R52 Pain, unspecified: Secondary | ICD-10-CM | POA: Diagnosis not present

## 2022-12-04 DIAGNOSIS — Z992 Dependence on renal dialysis: Secondary | ICD-10-CM | POA: Diagnosis not present

## 2022-12-04 DIAGNOSIS — E1122 Type 2 diabetes mellitus with diabetic chronic kidney disease: Secondary | ICD-10-CM | POA: Diagnosis not present

## 2022-12-04 DIAGNOSIS — D631 Anemia in chronic kidney disease: Secondary | ICD-10-CM | POA: Diagnosis not present

## 2022-12-05 ENCOUNTER — Ambulatory Visit (HOSPITAL_COMMUNITY): Payer: 59 | Admitting: Anesthesiology

## 2022-12-05 ENCOUNTER — Other Ambulatory Visit: Payer: Self-pay

## 2022-12-05 ENCOUNTER — Encounter (HOSPITAL_COMMUNITY): Payer: Self-pay | Admitting: Gastroenterology

## 2022-12-05 ENCOUNTER — Ambulatory Visit (HOSPITAL_COMMUNITY)
Admission: RE | Admit: 2022-12-05 | Discharge: 2022-12-05 | Disposition: A | Discharge status: Home / Self Care | Payer: 59 | Attending: Gastroenterology | Admitting: Gastroenterology

## 2022-12-05 ENCOUNTER — Encounter (HOSPITAL_COMMUNITY)
Admission: RE | Disposition: A | Discharge status: Home / Self Care | Payer: Self-pay | Source: Home / Self Care | Attending: Gastroenterology

## 2022-12-05 DIAGNOSIS — R195 Other fecal abnormalities: Secondary | ICD-10-CM | POA: Diagnosis not present

## 2022-12-05 DIAGNOSIS — Z992 Dependence on renal dialysis: Secondary | ICD-10-CM | POA: Insufficient documentation

## 2022-12-05 DIAGNOSIS — E1122 Type 2 diabetes mellitus with diabetic chronic kidney disease: Secondary | ICD-10-CM | POA: Diagnosis not present

## 2022-12-05 DIAGNOSIS — N189 Chronic kidney disease, unspecified: Secondary | ICD-10-CM | POA: Diagnosis not present

## 2022-12-05 DIAGNOSIS — D509 Iron deficiency anemia, unspecified: Secondary | ICD-10-CM | POA: Insufficient documentation

## 2022-12-05 DIAGNOSIS — K2950 Unspecified chronic gastritis without bleeding: Secondary | ICD-10-CM | POA: Diagnosis not present

## 2022-12-05 DIAGNOSIS — Z1211 Encounter for screening for malignant neoplasm of colon: Secondary | ICD-10-CM | POA: Insufficient documentation

## 2022-12-05 DIAGNOSIS — I130 Hypertensive heart and chronic kidney disease with heart failure and stage 1 through stage 4 chronic kidney disease, or unspecified chronic kidney disease: Secondary | ICD-10-CM | POA: Diagnosis not present

## 2022-12-05 DIAGNOSIS — N184 Chronic kidney disease, stage 4 (severe): Secondary | ICD-10-CM | POA: Diagnosis not present

## 2022-12-05 DIAGNOSIS — I5032 Chronic diastolic (congestive) heart failure: Secondary | ICD-10-CM | POA: Diagnosis not present

## 2022-12-05 DIAGNOSIS — I509 Heart failure, unspecified: Secondary | ICD-10-CM | POA: Insufficient documentation

## 2022-12-05 DIAGNOSIS — D122 Benign neoplasm of ascending colon: Secondary | ICD-10-CM

## 2022-12-05 DIAGNOSIS — K259 Gastric ulcer, unspecified as acute or chronic, without hemorrhage or perforation: Secondary | ICD-10-CM | POA: Diagnosis not present

## 2022-12-05 DIAGNOSIS — K644 Residual hemorrhoidal skin tags: Secondary | ICD-10-CM | POA: Insufficient documentation

## 2022-12-05 DIAGNOSIS — K635 Polyp of colon: Secondary | ICD-10-CM

## 2022-12-05 DIAGNOSIS — Z7984 Long term (current) use of oral hypoglycemic drugs: Secondary | ICD-10-CM | POA: Insufficient documentation

## 2022-12-05 DIAGNOSIS — I132 Hypertensive heart and chronic kidney disease with heart failure and with stage 5 chronic kidney disease, or end stage renal disease: Secondary | ICD-10-CM | POA: Diagnosis not present

## 2022-12-05 DIAGNOSIS — K317 Polyp of stomach and duodenum: Secondary | ICD-10-CM

## 2022-12-05 DIAGNOSIS — N186 End stage renal disease: Secondary | ICD-10-CM | POA: Diagnosis not present

## 2022-12-05 DIAGNOSIS — R1084 Generalized abdominal pain: Secondary | ICD-10-CM | POA: Diagnosis not present

## 2022-12-05 DIAGNOSIS — K2970 Gastritis, unspecified, without bleeding: Secondary | ICD-10-CM

## 2022-12-05 DIAGNOSIS — R042 Hemoptysis: Secondary | ICD-10-CM

## 2022-12-05 DIAGNOSIS — K648 Other hemorrhoids: Secondary | ICD-10-CM | POA: Insufficient documentation

## 2022-12-05 HISTORY — PX: BIOPSY: SHX5522

## 2022-12-05 HISTORY — PX: COLONOSCOPY WITH PROPOFOL: SHX5780

## 2022-12-05 HISTORY — PX: ESOPHAGOGASTRODUODENOSCOPY (EGD) WITH PROPOFOL: SHX5813

## 2022-12-05 HISTORY — PX: POLYPECTOMY: SHX5525

## 2022-12-05 LAB — POCT I-STAT, CHEM 8
BUN: 23 mg/dL — ABNORMAL HIGH (ref 6–20)
Calcium, Ion: 1.1 mmol/L — ABNORMAL LOW (ref 1.15–1.40)
Chloride: 102 mmol/L (ref 98–111)
Creatinine, Ser: 9.1 mg/dL — ABNORMAL HIGH (ref 0.61–1.24)
Glucose, Bld: 110 mg/dL — ABNORMAL HIGH (ref 70–99)
HCT: 37 % — ABNORMAL LOW (ref 39.0–52.0)
Hemoglobin: 12.6 g/dL — ABNORMAL LOW (ref 13.0–17.0)
Potassium: 4 mmol/L (ref 3.5–5.1)
Sodium: 142 mmol/L (ref 135–145)
TCO2: 28 mmol/L (ref 22–32)

## 2022-12-05 SURGERY — COLONOSCOPY WITH PROPOFOL
Anesthesia: Monitor Anesthesia Care

## 2022-12-05 MED ORDER — PHENYLEPHRINE HCL (PRESSORS) 10 MG/ML IV SOLN
INTRAVENOUS | Status: DC | PRN
  Administered 2022-12-05: 80 ug via INTRAVENOUS

## 2022-12-05 MED ORDER — PROPOFOL 500 MG/50ML IV EMUL
INTRAVENOUS | Status: DC | PRN
  Administered 2022-12-05: 40 mg via INTRAVENOUS
  Administered 2022-12-05: 30 mg via INTRAVENOUS
  Administered 2022-12-05: 200 ug/kg/min via INTRAVENOUS

## 2022-12-05 MED ORDER — SODIUM CHLORIDE 0.9 % IV SOLN: INTRAVENOUS | Status: DC

## 2022-12-05 MED ORDER — LIDOCAINE HCL (CARDIAC) PF 100 MG/5ML IV SOSY
PREFILLED_SYRINGE | INTRAVENOUS | Status: DC | PRN
  Administered 2022-12-05: 80 mg via INTRATRACHEAL

## 2022-12-05 MED ORDER — PANTOPRAZOLE SODIUM 40 MG PO TBEC: 40.0000 mg | DELAYED_RELEASE_TABLET | Freq: Two times a day (BID) | ORAL | 3 refills | Status: AC

## 2022-12-05 MED ORDER — EPHEDRINE SULFATE-NACL 50-0.9 MG/10ML-% IV SOSY
PREFILLED_SYRINGE | INTRAVENOUS | Status: DC | PRN
  Administered 2022-12-05: 7.5 mg via INTRAVENOUS

## 2022-12-05 SURGICAL SUPPLY — 25 items
BLOCK BITE 60FR ADLT L/F BLUE (MISCELLANEOUS) ×2 IMPLANT
ELECT REM PT RETURN 9FT ADLT (ELECTROSURGICAL)
ELECTRODE REM PT RTRN 9FT ADLT (ELECTROSURGICAL) IMPLANT
FCP BXJMBJMB 240X2.8X (CUTTING FORCEPS)
FLOOR PAD 36X40 (MISCELLANEOUS) ×2
FORCEP RJ3 GP 1.8X160 W-NEEDLE (CUTTING FORCEPS) IMPLANT
FORCEPS BIOP RAD 4 LRG CAP 4 (CUTTING FORCEPS) IMPLANT
FORCEPS BIOP RJ4 240 W/NDL (CUTTING FORCEPS)
FORCEPS BXJMBJMB 240X2.8X (CUTTING FORCEPS) IMPLANT
INJECTOR/SNARE I SNARE (MISCELLANEOUS) IMPLANT
LUBRICANT JELLY 4.5OZ STERILE (MISCELLANEOUS) IMPLANT
MANIFOLD NEPTUNE II (INSTRUMENTS) IMPLANT
NDL SCLEROTHERAPY 25GX240 (NEEDLE) IMPLANT
NEEDLE SCLEROTHERAPY 25GX240 (NEEDLE)
PAD FLOOR 36X40 (MISCELLANEOUS) ×2 IMPLANT
PROBE APC STR FIRE (PROBE) IMPLANT
PROBE INJECTION GOLD (MISCELLANEOUS)
PROBE INJECTION GOLD 7FR (MISCELLANEOUS) IMPLANT
SNARE ROTATE MED OVAL 20MM (MISCELLANEOUS) IMPLANT
SNARE SHORT THROW 13M SML OVAL (MISCELLANEOUS) IMPLANT
SYR 50ML LL SCALE MARK (SYRINGE) IMPLANT
TRAP SPECIMEN MUCOUS 40CC (MISCELLANEOUS) IMPLANT
TUBING ENDO SMARTCAP PENTAX (MISCELLANEOUS) ×4 IMPLANT
TUBING IRRIGATION ENDOGATOR (MISCELLANEOUS) ×2 IMPLANT
WATER STERILE IRR 1000ML POUR (IV SOLUTION) IMPLANT

## 2022-12-05 NOTE — Op Note (Signed)
[facility] Patient Name: Timothy [name] Procedure Date: 12/05/2022 MRN: [id number] Attending MD: Napoleon Form , MD, [id number] Date of Birth: 08-20-1970 CSN: [id number] Age: 52 Admit Type: Ambulatory Procedure:                Upper GI endoscopy Indications:              Suspected upper gastrointestinal bleeding,                            Suspected upper gastrointestinal bleeding in                            patient with unexplained iron deficiency anemia Providers:                Napoleon Form, MD, Suzy Bouchard, RN, Doristine Mango, RN, Marja Kays, Technician, Geoffery Lyons, Technician Referring MD:              Medicines:                Monitored Anesthesia Care Complications:            No immediate complications. Estimated Blood Loss:     Estimated blood loss was minimal. Procedure:                Pre-Anesthesia Assessment:                           - Prior to the procedure, a History and Physical                            was performed, and patient medications and                            allergies were reviewed. The patient's tolerance of                            previous anesthesia was also reviewed. The risks                            and benefits of the procedure and the sedation                            options and risks were discussed with the patient.                            All questions were answered, and informed consent                            was obtained. Prior Anticoagulants: The patient has  taken no anticoagulant or antiplatelet agents.                            After reviewing the risks and benefits, the patient                            was deemed in satisfactory condition to undergo the                            procedure.                           After obtaining informed consent, the endoscope was                             passed under direct vision. Throughout the                            procedure, the patient's blood pressure, pulse, and                            oxygen saturations were monitored continuously. The                            [id number]-H190 ([id number]) Olympus endoscope was introduced                            through the mouth, and advanced to the second part                            of duodenum. The upper GI endoscopy was                            accomplished without difficulty. The patient                            tolerated the procedure well. Scope In: Scope Out: Findings:      The Z-line was regular and was found 38 cm from the incisors.      The exam of the esophagus was otherwise normal.      A few 10 to 20 mm pedunculated and sessile polyps with no bleeding and       stigmata of recent bleeding were found in the prepyloric region of the       stomach. Biopsies were taken with a cold forceps for histology.      Patchy mild inflammation characterized by congestion (edema) and       erythema was found in the entire examined stomach. Biopsies were taken       with a cold forceps for Helicobacter pylori testing.      The cardia and gastric fundus were normal on retroflexion.      The examined duodenum was normal. Impression:               - Z-line regular, 38 cm from the incisors.                           -  A few gastric polyps. Biopsied.                           - Gastritis. Biopsied.                           - Normal examined duodenum. Moderate Sedation:      N/A Recommendation:           - Patient has a contact number available for                            emergencies. The signs and symptoms of potential                            delayed complications were discussed with the                            patient. Return to normal activities tomorrow.                            Written discharge instructions were provided to the                            patient.                            - Resume previous diet.                           - Continue present medications.                           - Await pathology results.                           - Repeat upper endoscopy after studies are complete                            for surveillance based on pathology results.                           - Use Protonix (pantoprazole) 40 mg PO BID.                           - No aspirin, ibuprofen, naproxen, or other                            non-steroidal anti-inflammatory drugs. Procedure Code(s):        --- Professional ---                           [phone], Esophagogastroduodenoscopy, flexible,                            transoral; with biopsy, single or multiple Diagnosis Code(s):        --- Professional ---  K31.7, Polyp of stomach and duodenum                           K29.70, Gastritis, unspecified, without bleeding                           D50.9, Iron deficiency anemia, unspecified CPT copyright 2022 American Medical Association. All rights reserved. The codes documented in this report are preliminary and upon coder review may  be revised to meet current compliance requirements. Napoleon Form, MD 12/05/2022 11:24:23 AM This report has been signed electronically. Number of Addenda: 0

## 2022-12-05 NOTE — Op Note (Signed)
[facility] Patient Name: Timothy [name] Procedure Date: 12/05/2022 MRN: [id number] Attending MD: Napoleon Form , MD, [id number] Date of Birth: 1970/02/19 CSN: [id number] Age: 52 Admit Type: Outpatient Procedure:                Colonoscopy Indications:              Screening for colorectal malignant neoplasm Providers:                Napoleon Form, MD, Suzy Bouchard, RN, Doristine Mango, RN, Geoffery Lyons, Technician, Marja Kays, Technician Referring MD:              Medicines:                Monitored Anesthesia Care Complications:            No immediate complications. Estimated Blood Loss:     Estimated blood loss was minimal. Procedure:                Pre-Anesthesia Assessment:                           - Prior to the procedure, a History and Physical                            was performed, and patient medications and                            allergies were reviewed. The patient's tolerance of                            previous anesthesia was also reviewed. The risks                            and benefits of the procedure and the sedation                            options and risks were discussed with the patient.                            All questions were answered, and informed consent                            was obtained. Prior Anticoagulants: The patient has                            taken no anticoagulant or antiplatelet agents. ASA                            Grade Assessment: III - A patient with severe  systemic disease. After reviewing the risks and                            benefits, the patient was deemed in satisfactory                            condition to undergo the procedure.                           After obtaining informed consent, the colonoscope                            was passed under direct vision. Throughout the                             procedure, the patient's blood pressure, pulse, and                            oxygen saturations were monitored continuously. The                            [id number]-HQ190L ([id number]) Olympus colonoscope was                            introduced through the anus and advanced to the the                            cecum, identified by appendiceal orifice and                            ileocecal valve. The colonoscopy was performed                            without difficulty. The patient tolerated the                            procedure well. The quality of the bowel                            preparation was good. The ileocecal valve,                            appendiceal orifice, and rectum were photographed. Scope In: 10:48:35 AM Scope Out: 11:03:24 AM Scope Withdrawal Time: 0 hours 8 minutes 22 seconds  Total Procedure Duration: 0 hours 14 minutes 49 seconds  Findings:      The perianal and digital rectal examinations were normal.      A less than 1 mm polyp was found in the ascending colon. The polyp was       sessile. The polyp was removed with a cold biopsy forceps. Resection and       retrieval were complete.      Non-bleeding external and internal hemorrhoids were found during       retroflexion. The hemorrhoids were medium-sized. Impression:               -  One less than 1 mm polyp in the ascending colon,                            removed with a cold biopsy forceps. Resected and                            retrieved.                           - Non-bleeding external and internal hemorrhoids. Moderate Sedation:      N/A Recommendation:           - Patient has a contact number available for                            emergencies. The signs and symptoms of potential                            delayed complications were discussed with the                            patient. Return to normal activities tomorrow.                            Written discharge instructions were  provided to the                            patient.                           - Resume previous diet.                           - Continue present medications.                           - Await pathology results.                           - Repeat colonoscopy in 7-10 years for surveillance                            based on pathology results. Procedure Code(s):        --- Professional ---                           [id number], Colonoscopy, flexible; with biopsy, single                            or multiple Diagnosis Code(s):        --- Professional ---                           Z12.11, Encounter for screening for malignant                            neoplasm of colon  D12.2, Benign neoplasm of ascending colon                           K64.8, Other hemorrhoids CPT copyright 2022 American Medical Association. All rights reserved. The codes documented in this report are preliminary and upon coder review may  be revised to meet current compliance requirements. Napoleon Form, MD 12/05/2022 11:17:35 AM This report has been signed electronically. Number of Addenda: 0

## 2022-12-05 NOTE — Anesthesia Preprocedure Evaluation (Signed)
Anesthesia Evaluation  Patient identified by MRN, date of birth, ID band Patient awake    Reviewed: Allergy & Precautions, NPO status , Patient's Chart, lab work & pertinent test results, reviewed documented beta blocker date and time   Airway Mallampati: III  TM Distance: >3 FB Neck ROM: Full    Dental  (+) Dental Advisory Given, Poor Dentition   Pulmonary neg pulmonary ROS   Pulmonary exam normal breath sounds clear to auscultation       Cardiovascular hypertension, Pt. on medications and Pt. on home beta blockers pulmonary hypertension (mild pHTN on echo)+CHF (normal LVEF, grade 2 diastolic dysfunction)  Normal cardiovascular exam Rhythm:Regular Rate:Normal  Echo 09/09/22:   1. Left ventricular ejection fraction, by estimation, is 60 to 65%. Left  ventricular ejection fraction by 3D volume is 65 %. The left ventricle has  normal function. The left ventricle has no regional wall motion  abnormalities. There is mild left  ventricular hypertrophy. Left ventricular diastolic parameters are  consistent with Grade II diastolic dysfunction (pseudonormalization).   2. Right ventricular systolic function is normal. The right ventricular  size is normal. There is mildly elevated pulmonary artery systolic  pressure. The estimated right ventricular systolic pressure is 41.9 mmHg.   3. Left atrial size was moderately dilated.   4. Right atrial size was moderately dilated.   5. The mitral valve is normal in structure. No evidence of mitral valve  regurgitation. No evidence of mitral stenosis.   6. The aortic valve is tricuspid. Aortic valve regurgitation is not  visualized. No aortic stenosis is present.   7. Aortic dilatation noted. There is borderline dilatation of the  ascending aorta, measuring 38 mm.   8. The inferior vena cava is normal in size with greater than 50%  respiratory variability, suggesting right atrial pressure of 3 mmHg.      Neuro/Psych negative neurological ROS  negative psych ROS   GI/Hepatic Neg liver ROS,GERD  Medicated and Controlled,,  Endo/Other  diabetes, Well Controlled, Type 2, Insulin Dependent  K 3.8  Renal/GU Renal disease  negative genitourinary   Musculoskeletal negative musculoskeletal ROS (+)    Abdominal Normal abdominal exam  (+)   Peds  Hematology  (+) Blood dyscrasia, anemia Hb 9.2   Anesthesia Other Findings Ozempic LD:   Reproductive/Obstetrics negative OB ROS                             Anesthesia Physical Anesthesia Plan  ASA: 3  Anesthesia Plan: MAC   Post-op Pain Management: Regional block*   Induction:   PONV Risk Score and Plan: 1 and Propofol infusion and TIVA  Airway Management Planned: Natural Airway and Simple Face Mask  Additional Equipment: None  Intra-op Plan:   Post-operative Plan:   Informed Consent: I have reviewed the patients History and Physical, chart, labs and discussed the procedure including the risks, benefits and alternatives for the proposed anesthesia with the patient or authorized representative who has indicated his/her understanding and acceptance.       Plan Discussed with: CRNA  Anesthesia Plan Comments:         Anesthesia Quick Evaluation

## 2022-12-05 NOTE — Discharge Instructions (Signed)

## 2022-12-05 NOTE — Interval H&P Note (Signed)
History and Physical Interval Note:  12/05/2022 10:02 AM  Timothy [name]  has presented today for surgery, with the diagnosis of heme pos stool ganeralized abd pain.  The various methods of treatment have been discussed with the patient and family. After consideration of risks, benefits and other options for treatment, the patient has consented to  Procedure(s): COLONOSCOPY WITH PROPOFOL (N/A) ESOPHAGOGASTRODUODENOSCOPY (EGD) WITH PROPOFOL (N/A) as a surgical intervention.  The patient's history has been reviewed, patient examined, no change in status, stable for surgery.  I have reviewed the patient's chart and labs.  Questions were answered to the patient's satisfaction.     Krew Hortman

## 2022-12-05 NOTE — Transfer of Care (Signed)
Immediate Anesthesia Transfer of Care Note  Patient: Timothy [name]  Procedure(s) Performed: COLONOSCOPY WITH PROPOFOL ESOPHAGOGASTRODUODENOSCOPY (EGD) WITH PROPOFOL BIOPSY POLYPECTOMY  Patient Location: PACU  Anesthesia Type:MAC  Level of Consciousness: sedated  Airway & Oxygen Therapy: Patient Spontanous Breathing and Patient connected to face mask  Post-op Assessment: Report given to RN  Post vital signs: Reviewed and stable  Last Vitals:  Vitals Value Taken Time  BP 128/56 12/05/22 1111  Temp 36.3 C 12/05/22 1111  Pulse 70 12/05/22 1114  Resp 17 12/05/22 1114  SpO2 100 % 12/05/22 1114  Vitals shown include unfiled device data.  Last Pain:  Vitals:   12/05/22 1111  TempSrc: Temporal  PainSc:          Complications: No notable events documented.

## 2022-12-05 NOTE — Anesthesia Postprocedure Evaluation (Signed)
Anesthesia Post Note  Patient: [name]  Procedure(s) Performed: COLONOSCOPY WITH PROPOFOL ESOPHAGOGASTRODUODENOSCOPY (EGD) WITH PROPOFOL BIOPSY POLYPECTOMY     Patient location during evaluation: Endoscopy Anesthesia Type: MAC Level of consciousness: awake Pain management: pain level controlled Vital Signs Assessment: post-procedure vital signs reviewed and stable Respiratory status: spontaneous breathing Cardiovascular status: stable Postop Assessment: no apparent nausea or vomiting Anesthetic complications: no  No notable events documented.  Last Vitals:  Vitals:   12/05/22 1150 12/05/22 1154  BP: (!) 168/83 (!) 170/88  Pulse: 67 66  Resp: 15 12  Temp:    SpO2: 96% 99%    Last Pain:  Vitals:   12/05/22 1154  TempSrc:   PainSc: 0-No pain                 Caren Macadam

## 2022-12-06 ENCOUNTER — Encounter (HOSPITAL_COMMUNITY): Payer: Self-pay | Admitting: Gastroenterology

## 2022-12-06 DIAGNOSIS — N2581 Secondary hyperparathyroidism of renal origin: Secondary | ICD-10-CM | POA: Diagnosis not present

## 2022-12-06 DIAGNOSIS — D509 Iron deficiency anemia, unspecified: Secondary | ICD-10-CM | POA: Diagnosis not present

## 2022-12-06 DIAGNOSIS — Z992 Dependence on renal dialysis: Secondary | ICD-10-CM | POA: Diagnosis not present

## 2022-12-06 DIAGNOSIS — D631 Anemia in chronic kidney disease: Secondary | ICD-10-CM | POA: Diagnosis not present

## 2022-12-06 DIAGNOSIS — D689 Coagulation defect, unspecified: Secondary | ICD-10-CM | POA: Diagnosis not present

## 2022-12-06 DIAGNOSIS — N186 End stage renal disease: Secondary | ICD-10-CM | POA: Diagnosis not present

## 2022-12-06 DIAGNOSIS — R509 Fever, unspecified: Secondary | ICD-10-CM | POA: Diagnosis not present

## 2022-12-06 DIAGNOSIS — E1122 Type 2 diabetes mellitus with diabetic chronic kidney disease: Secondary | ICD-10-CM | POA: Diagnosis not present

## 2022-12-09 ENCOUNTER — Other Ambulatory Visit: Payer: Medicare Other

## 2022-12-09 ENCOUNTER — Other Ambulatory Visit (HOSPITAL_BASED_OUTPATIENT_CLINIC_OR_DEPARTMENT_OTHER): Payer: Self-pay | Admitting: *Deleted

## 2022-12-09 ENCOUNTER — Telehealth (HOSPITAL_BASED_OUTPATIENT_CLINIC_OR_DEPARTMENT_OTHER): Payer: Self-pay | Admitting: Family Medicine

## 2022-12-09 DIAGNOSIS — N186 End stage renal disease: Secondary | ICD-10-CM | POA: Diagnosis not present

## 2022-12-09 DIAGNOSIS — E1122 Type 2 diabetes mellitus with diabetic chronic kidney disease: Secondary | ICD-10-CM

## 2022-12-09 DIAGNOSIS — D689 Coagulation defect, unspecified: Secondary | ICD-10-CM | POA: Diagnosis not present

## 2022-12-09 DIAGNOSIS — Z992 Dependence on renal dialysis: Secondary | ICD-10-CM | POA: Diagnosis not present

## 2022-12-09 DIAGNOSIS — N2581 Secondary hyperparathyroidism of renal origin: Secondary | ICD-10-CM | POA: Diagnosis not present

## 2022-12-09 DIAGNOSIS — D509 Iron deficiency anemia, unspecified: Secondary | ICD-10-CM | POA: Diagnosis not present

## 2022-12-09 DIAGNOSIS — R509 Fever, unspecified: Secondary | ICD-10-CM | POA: Diagnosis not present

## 2022-12-09 DIAGNOSIS — D631 Anemia in chronic kidney disease: Secondary | ICD-10-CM | POA: Diagnosis not present

## 2022-12-09 LAB — SURGICAL PATHOLOGY

## 2022-12-09 MED ORDER — PEN NEEDLES 31G X 5 MM MISC: 1.0000 | Freq: Every day | 1 refills | Status: DC

## 2022-12-09 NOTE — Telephone Encounter (Signed)
Pt needed pen needles refilled sent to [facility]   Pt advised they were sent

## 2022-12-09 NOTE — Telephone Encounter (Signed)
Pt is calling wanting to speak to MA --advised she was in clinic, and I would have her call back

## 2022-12-10 ENCOUNTER — Ambulatory Visit (INDEPENDENT_AMBULATORY_CARE_PROVIDER_SITE_OTHER): Payer: 59 | Admitting: Podiatry

## 2022-12-10 DIAGNOSIS — L84 Corns and callosities: Secondary | ICD-10-CM

## 2022-12-10 DIAGNOSIS — N186 End stage renal disease: Secondary | ICD-10-CM

## 2022-12-10 DIAGNOSIS — Z992 Dependence on renal dialysis: Secondary | ICD-10-CM | POA: Diagnosis not present

## 2022-12-10 DIAGNOSIS — B351 Tinea unguium: Secondary | ICD-10-CM | POA: Diagnosis not present

## 2022-12-10 DIAGNOSIS — Z794 Long term (current) use of insulin: Secondary | ICD-10-CM

## 2022-12-10 DIAGNOSIS — E119 Type 2 diabetes mellitus without complications: Secondary | ICD-10-CM

## 2022-12-10 DIAGNOSIS — M79674 Pain in right toe(s): Secondary | ICD-10-CM | POA: Diagnosis not present

## 2022-12-10 DIAGNOSIS — Q828 Other specified congenital malformations of skin: Secondary | ICD-10-CM | POA: Diagnosis not present

## 2022-12-10 DIAGNOSIS — E1122 Type 2 diabetes mellitus with diabetic chronic kidney disease: Secondary | ICD-10-CM | POA: Diagnosis not present

## 2022-12-10 DIAGNOSIS — S81801A Unspecified open wound, right lower leg, initial encounter: Secondary | ICD-10-CM

## 2022-12-10 DIAGNOSIS — M79675 Pain in left toe(s): Secondary | ICD-10-CM | POA: Diagnosis not present

## 2022-12-10 MED ORDER — GENTAMICIN SULFATE 0.1 % EX CREA: 1.0000 | TOPICAL_CREAM | Freq: Every day | CUTANEOUS | 0 refills | Status: DC

## 2022-12-10 NOTE — Progress Notes (Signed)
Cardiology Clinic Note   Patient Name: Timothy [name] Date of Encounter: 12/13/2022  Primary Care Provider:  [name], Buren Kos, MD Primary Cardiologist:  Little Ishikawa, MD  Patient Profile    Timothy [name] 52 year old male presents to the clinic today for follow-up evaluation of his chronic diastolic CHF and hypertension.  Past Medical History    Past Medical History:  Diagnosis Date   Allergies    Anemia    Blind left eye    CHF (congestive heart failure) (HCC)    Chronic kidney disease    stage 4   Diabetes mellitus without complication (HCC)    Dyspnea    Patient denies SOB since he started dialysis. States he went away after he started dialysis   HLD (hyperlipidemia)    Hypertension    Pneumonia    Past Surgical History:  Procedure Laterality Date   A/V FISTULAGRAM Left 11/19/2022   Procedure: A/V Fistulagram;  Surgeon: Nada Libman, MD;  Location: [facility];  Service: Cardiovascular;  Laterality: Left;   AV FISTULA PLACEMENT Left 09/19/2022   Procedure: ARTERIOVENOUS (AV) FISTULA CREATION;  Surgeon: Nada Libman, MD;  Location: [facility];  Service: Vascular;  Laterality: Left;   BIOPSY  12/05/2022   Procedure: BIOPSY;  Surgeon: Napoleon Form, MD;  Location: [facility];  Service: Gastroenterology;;   CATARACT EXTRACTION Right    COLONOSCOPY WITH PROPOFOL N/A 12/05/2022   Procedure: COLONOSCOPY WITH PROPOFOL;  Surgeon: Napoleon Form, MD;  Location: [facility];  Service: Gastroenterology;  Laterality: N/A;   ESOPHAGOGASTRODUODENOSCOPY (EGD) WITH PROPOFOL N/A 12/05/2022   Procedure: ESOPHAGOGASTRODUODENOSCOPY (EGD) WITH PROPOFOL;  Surgeon: Napoleon Form, MD;  Location: [facility];  Service: Gastroenterology;  Laterality: N/A;   MENISCUS REPAIR Left    PERIPHERAL VASCULAR INTERVENTION  11/19/2022   Procedure: PERIPHERAL VASCULAR INTERVENTION;  Surgeon: Nada Libman, MD;  Location: [facility];   Service: Cardiovascular;;  Embolization coil   POLYPECTOMY  12/05/2022   Procedure: POLYPECTOMY;  Surgeon: Napoleon Form, MD;  Location: [facility];  Service: Gastroenterology;;    Allergies  No Known Allergies  History of Present Illness    Timothy [name] has a PMH of chronic diastolic CHF, hypertensive urgency, acute respiratory failure, pneumonia, hematemesis, type 2 diabetes, CKD stage IV, elevated troponin, and positive D-dimer.  Echocardiogram 5/21 showed normal BiV function, severe LVH, mild MR/AI.  His echocardiogram 5/22 showed normal biventricular function and intermediate diastolic function, moderate LVH and no significant valvular abnormalities.  He was admitted to the hospital 5/19 until 06/24/2020 with pneumonia and acute chronic diastolic CHF.  He presented with a fever, oxygen saturation of 86% on room air, creatinine 2.71, WBCs 12.2.  His chest x-ray showed multi focal pneumonia with concern for hypertension pneumonitis versus pulmonary edema.  He received IV antibiotics and diuresis.  His COVID was negative.  He was transition to p.o. furosemide and his creatinine on discharge was 2.73.  He was seen in follow-up by Dr. [name] on 04/12/2021.  During that time he reported he had an episode of sudden onset of shortness of breath that morning.  He denied chest pain.  He denied headaches.  He denied lightheadedness and syncope.  He had no lower extremity edema and denied palpitations.  He reported compliance with his blood pressure medications.  He had not been checking his blood pressure at home.  He reported having an appointment with neurology later that  month.  His blood pressure was noted to be 215/112.  Due to his sudden onset of shortness of breath it was recommended that he be seen in the emergency department.  He declined EMS but agreed to have his girlfriend take him to the emergency department.  He presented to the clinic 08/12/22 for follow-up evaluation and  stated he felt well.  He did occasionally notice cramps in his calves.  His blood pressure was significantly improved at 156/72.  We reviewed his medications and secondary causes for hypertension.  I increased his metoprolol to 100 mg daily.  He reported that he did not take labetalol because it maked him feel sick.  I checked a magnesium level, instructed him that it was okay to take an extra 50 mg of hydralazine for systolic blood pressure greater than 160, I refered to advanced hypertension clinic, and planned follow-up with Dr. [name] or me in 3 months.  I planned repeat echocardiogram.  Echocardiogram 09/09/2022 showed LVEF of 60-65%, G2 DD, mildly elevated pulmonary artery pressure, moderately dilated right and left atria and borderline ascending aortic dilation measuring 38 mm.  He went to the operating room on 11/19/2022 and had AV fistulogram by Dr. Myra Gianotti.  He was found to have widely patent central venous system.  His atrial venous anastomosis was widely patent.  He was noted to have multiple competing branches of his upper arm.  On further evaluation the competing branch had vascular spasm and was not visible.  Completion imaging was completed and showed the largest competing branch disappeared.  He was granted permission to use fistula in approximately 2 weeks.  It was felt that if he had continued difficulty with his AV fistula he would need to be scheduled for branch ligation.  He presents to the clinic today for follow-up evaluation and states he is feeling fairly well.  His blood pressure is well-controlled.  We reviewed his recent surgery on his left arm fistula.  He reports compliance with hemodialysis Monday Wednesday Friday.  He is completing 4-hour sessions.  He reports that he will have cholesterol labs drawn in January.  I will request these from his PCP.  I asked him to increase his physical activity as tolerated, continue heart healthy low-sodium diet, and we will plan follow-up in 6  months.  Today he denies chest pain, shortness of breath, lower extremity edema, fatigue, palpitations, melena, hematuria, hemoptysis, diaphoresis, weakness, presyncope, syncope, orthopnea, and PND.   Home Medications    Prior to Admission medications   Medication Sig Start Date End Date Taking? Authorizing Provider  amLODipine (NORVASC) 10 MG tablet Take 10 mg by mouth daily. 10/21/20   [provider]  atorvastatin (LIPITOR) 40 MG tablet Take 40 mg by mouth daily.    [provider]  ENTRESTO 49-51 MG Take 1 tablet by mouth 2 (two) times daily. 01/09/22   [provider]  FARXIGA 10 MG TABS tablet Take 10 mg by mouth daily. 01/09/22   [provider]  furosemide (LASIX) 80 MG tablet Take 80 mg by mouth daily.    [provider]  glimepiride (AMARYL) 4 MG tablet Take 4 mg by mouth daily. 01/12/21   [provider]  hydrALAZINE (APRESOLINE) 100 MG tablet Take 50 mg by mouth 3 (three) times daily. 11/09/20   [provider]  hydrOXYzine (VISTARIL) 25 MG capsule Take 25 mg by mouth at bedtime. 11/01/20   [provider]  insulin glargine (LANTUS SOLOSTAR) 100 UNIT/ML Solostar Pen INJECT  5 UINTS INTO THE SKIN DAILY 01/30/22   [name], Buren Kos, MD  Insulin Pen Needle (PEN NEEDLES) 31G X 5 MM MISC 1 each by Does not apply route daily. 08/05/22   [name], [name], MD  isosorbide mononitrate (IMDUR) 30 MG 24 hr tablet Take 30 mg by mouth daily.    [provider]  labetalol (NORMODYNE) 200 MG tablet Take 400 mg by mouth 2 (two) times daily. 12/12/21   [provider]  levocetirizine (XYZAL) 5 MG tablet Take 5 mg by mouth every evening. 10/24/20   [provider]  meloxicam (MOBIC) 15 MG tablet Take 15 mg by mouth daily. 01/12/21   [provider]  metoprolol succinate (TOPROL-XL) 50 MG 24 hr tablet Take 50 mg by mouth daily. 11/02/20   [provider]  omeprazole (PRILOSEC) 40 MG capsule Take  40 mg by mouth daily. 01/12/21   [provider]    Family History    Family History  Problem Relation Age of Onset   Hypertension Mother    Diabetes Mother    Heart attack Mother    Diabetes Brother    He indicated that his mother is deceased. He indicated that the status of his father is unknown. He indicated that his sister is alive. He indicated that all of his three brothers are alive. He indicated that his maternal grandmother is deceased. He indicated that his maternal grandfather is deceased. He indicated that his paternal grandmother is deceased. He indicated that his paternal grandfather is deceased. He indicated that his daughter is alive.  Social History    Social History   Socioeconomic History   Marital status: Single    Spouse name: Not on file   Number of children: 1   Years of education: Not on file   Highest education level: Not on file  Occupational History   Occupation: disabled  Tobacco Use   Smoking status: Never   Smokeless tobacco: Never  Vaping Use   Vaping status: Never Used  Substance and Sexual Activity   Alcohol use: Not Currently   Drug use: Not Currently   Sexual activity: Not on file  Other Topics Concern   Not on file  Social History Narrative   Not on file   Social Determinants of Health   Financial Resource Strain: Not on file  Food Insecurity: Not on file  Transportation Needs: Not on file  Physical Activity: Not on file  Stress: Not on file  Social Connections: Not on file  Intimate Partner Violence: Not on file     Review of Systems    General:  No chills, fever, night sweats or weight changes.  Cardiovascular:  No chest pain, dyspnea on exertion, edema, orthopnea, palpitations, paroxysmal nocturnal dyspnea. Dermatological: No rash, lesions/masses Respiratory: No cough, dyspnea Urologic: No hematuria, dysuria Abdominal:   No nausea, vomiting, diarrhea, bright red blood per rectum, melena, or  hematemesis Neurologic:  No visual changes, wkns, changes in mental status. All other systems reviewed and are otherwise negative except as noted above.  Physical Exam    VS:  BP 124/60 (BP Location: Right Arm, Patient Position: Sitting, Cuff Size: Normal)   Pulse 81   Ht 5\' 6"  (1.676 m)   Wt 171 lb (77.6 kg)   SpO2 94%   BMI 27.60 kg/m  , BMI Body mass index is 27.6 kg/m. GEN: Well nourished, well developed, in no acute distress. HEENT: normal. Neck: Supple, no JVD, carotid bruits, or masses.  Cardiac: RRR, no murmurs, rubs, or gallops. No clubbing, cyanosis, edema.  Radials/DP/PT 2+ and equal bilaterally.  Respiratory:  Respirations regular and unlabored, clear to auscultation bilaterally. GI: Soft, nontender, nondistended, BS + x 4. MS: no deformity or atrophy. Skin: warm and dry, no rash. Neuro:  Strength and sensation are intact. Psych: Normal affect.  Accessory Clinical Findings    Recent Labs: 08/21/2022: Magnesium 1.6 11/07/2022: ALT 11; Platelets 272.0 12/05/2022: BUN 23; Creatinine, Ser 9.10; Hemoglobin 12.6; Potassium 4.0; Sodium 142   Recent Lipid Panel    Component Value Date/Time   CHOL 204 (H) 10/12/2020 0930   TRIG 84 10/12/2020 0930   HDL 42 10/12/2020 0930   CHOLHDL 4.9 10/12/2020 0930   CHOLHDL 6.4 06/22/2020 1410   VLDL 19 06/22/2020 1410   LDLCALC 147 (H) 10/12/2020 0930         ECG personally reviewed by me today-none today.  EKG 08/12/2022 normal sinus rhythm minimal voltage criteria for LVH T wave abnormality 83 bpm  Echocardiogram 06/22/2020   IMPRESSIONS     1. Left ventricular ejection fraction, by estimation, is 60 to 65%. The  left ventricle has normal function. The left ventricle has no regional  wall motion abnormalities. There is moderate left ventricular hypertrophy.  Left ventricular diastolic  parameters are indeterminate.   2. Right ventricular systolic function is normal. The right ventricular  size is normal. There is  normal pulmonary artery systolic pressure. The  estimated right ventricular systolic pressure is 25.1 mmHg.   3. Left atrial size was mildly dilated.   4. The mitral valve is normal in structure. Trivial mitral valve  regurgitation. No evidence of mitral stenosis.   5. The aortic valve is tricuspid. Aortic valve regurgitation is trivial.  No aortic stenosis is present.   6. The inferior vena cava is normal in size with greater than 50%  respiratory variability, suggesting right atrial pressure of 3 mmHg.   FINDINGS   Left Ventricle: Left ventricular ejection fraction, by estimation, is 60  to 65%. The left ventricle has normal function. The left ventricle has no  regional wall motion abnormalities. The left ventricular internal cavity  size was normal in size. There is   moderate left ventricular hypertrophy. Left ventricular diastolic  parameters are indeterminate.   Right Ventricle: The right ventricular size is normal. No increase in  right ventricular wall thickness. Right ventricular systolic function is  normal. There is normal pulmonary artery systolic pressure. The tricuspid  regurgitant velocity is 2.35 m/s, and   with an assumed right atrial pressure of 3 mmHg, the estimated right  ventricular systolic pressure is 25.1 mmHg.   Left Atrium: Left atrial size was mildly dilated.   Right Atrium: Right atrial size was normal in size.   Pericardium: There is no evidence of pericardial effusion.   Mitral Valve: The mitral valve is normal in structure. Trivial mitral  valve regurgitation. No evidence of mitral valve stenosis.   Tricuspid Valve: The tricuspid valve is normal in structure. Tricuspid  valve regurgitation is trivial. No evidence of tricuspid stenosis.   Aortic Valve: The aortic valve is tricuspid. Aortic valve regurgitation is  trivial. No aortic stenosis is present.   Pulmonic Valve: The pulmonic valve was normal in structure. Pulmonic valve  regurgitation is  trivial. No evidence of pulmonic stenosis.   Aorta: The aortic root is normal in size and structure.   Venous: The inferior vena cava is normal in size with greater than 50%  respiratory  variability, suggesting right atrial pressure of 3 mmHg.   IAS/Shunts: The interatrial septum was not well visualized.   Echocardiogram 09/09/2022  IMPRESSIONS     1. Left ventricular ejection fraction, by estimation, is 60 to 65%. Left  ventricular ejection fraction by 3D volume is 65 %. The left ventricle has  normal function. The left ventricle has no regional wall motion  abnormalities. There is mild left  ventricular hypertrophy. Left ventricular diastolic parameters are  consistent with Grade [name] diastolic dysfunction (pseudonormalization).   2. Right ventricular systolic function is normal. The right ventricular  size is normal. There is mildly elevated pulmonary artery systolic  pressure. The estimated right ventricular systolic pressure is 41.9 mmHg.   3. Left atrial size was moderately dilated.   4. Right atrial size was moderately dilated.   5. The mitral valve is normal in structure. No evidence of mitral valve  regurgitation. No evidence of mitral stenosis.   6. The aortic valve is tricuspid. Aortic valve regurgitation is not  visualized. No aortic stenosis is present.   7. Aortic dilatation noted. There is borderline dilatation of the  ascending aorta, measuring 38 mm.   8. The inferior vena cava is normal in size with greater than 50%  respiratory variability, suggesting right atrial pressure of 3 mmHg.   Comparison(s): Changes from prior study are noted. 06/12/2020: LVEF 60-65%.   FINDINGS   Left Ventricle: Left ventricular ejection fraction, by estimation, is 60  to 65%. Left ventricular ejection fraction by 3D volume is 65 %. The left  ventricle has normal function. The left ventricle has no regional wall  motion abnormalities. The left  ventricular internal cavity size was  normal in size. There is mild left  ventricular hypertrophy. Left ventricular diastolic parameters are  consistent with Grade [name] diastolic dysfunction (pseudonormalization).  Indeterminate filling pressures.   Right Ventricle: The right ventricular size is normal. No increase in  right ventricular wall thickness. Right ventricular systolic function is  normal. There is mildly elevated pulmonary artery systolic pressure. The  tricuspid regurgitant velocity is 3.12   m/s, and with an assumed right atrial pressure of 3 mmHg, the estimated  right ventricular systolic pressure is 41.9 mmHg.   Left Atrium: Left atrial size was moderately dilated.   Right Atrium: Right atrial size was moderately dilated.   Pericardium: There is no evidence of pericardial effusion.   Mitral Valve: The mitral valve is normal in structure. No evidence of  mitral valve regurgitation. No evidence of mitral valve stenosis.   Tricuspid Valve: The tricuspid valve is grossly normal. Tricuspid valve  regurgitation is mild . No evidence of tricuspid stenosis.   Aortic Valve: The aortic valve is tricuspid. Aortic valve regurgitation is  not visualized. No aortic stenosis is present.   Pulmonic Valve: The pulmonic valve was grossly normal. Pulmonic valve  regurgitation is trivial. No evidence of pulmonic stenosis.   Aorta: Aortic dilatation noted. There is borderline dilatation of the  ascending aorta, measuring 38 mm.   Venous: The inferior vena cava is normal in size with greater than 50%  respiratory variability, suggesting right atrial pressure of 3 mmHg.   IAS/Shunts: No atrial level shunt detected by color flow Doppler.   Assessment & Plan   1.  Resistant hypertension-BP today 124/60 Continue to maintain blood pressure log Continue amlodipine, Entresto, furosemide, hydralazine, Imdur, metoprolol May take an extra 50 mg of hydralazine as needed for systolic blood pressure greater than 160 Secondary  causes of  hypertension-again reviewed Follow-up with advanced hypertension clinic  Chronic diastolic CHF-echocardiogram reassuring.  Continues to have some exertional dyspnea and decreased endurance.  Echocardiogram details above.  Recently with some exertional dyspnea and decreased endurance.   Continue Entresto, metoprolol, hydralazine, Farxiga Heart healthy low-sodium diet Increase physical activity as tolerated  Hyperlipidemia-LDL 147 on 10/12/20. Continue atorvastatin High-fiber diet Repeat fasting lipids and LFTs-will have drawn with PCP  CKD stage IV-creatinine 11.35 on 08/05/2022.  Creatinine 9.1 on 12/05/2022.   Following with nephrology  Type 2 diabetes-glucose 110 on 12/05/2022.   Continue current medical therapy Follows with PCP   Disposition: Follow-up with  Dr. [name] or me in 6 months.   Thomasene Ripple. Kendall Arnell NP-C     12/13/2022, 4:00 PM [facility] [address] Office [phone] Fax [phone]    I spent 14  minutes examining this patient, reviewing medications, and using patient centered shared decision making involving her cardiac care.  Prior to her visit I spent greater than 20 minutes reviewing her past medical history,  medications, and prior cardiac tests.

## 2022-12-10 NOTE — Patient Instructions (Signed)
I have sent a referral to Wound Care for your right leg.  Prescription has been sent for antibiotic cream for your right leg.  Schedule appointment for your diabetic shoes so you can be measured/casted.

## 2022-12-11 DIAGNOSIS — R509 Fever, unspecified: Secondary | ICD-10-CM | POA: Diagnosis not present

## 2022-12-11 DIAGNOSIS — N2581 Secondary hyperparathyroidism of renal origin: Secondary | ICD-10-CM | POA: Diagnosis not present

## 2022-12-11 DIAGNOSIS — D631 Anemia in chronic kidney disease: Secondary | ICD-10-CM | POA: Diagnosis not present

## 2022-12-11 DIAGNOSIS — D509 Iron deficiency anemia, unspecified: Secondary | ICD-10-CM | POA: Diagnosis not present

## 2022-12-11 DIAGNOSIS — E1122 Type 2 diabetes mellitus with diabetic chronic kidney disease: Secondary | ICD-10-CM | POA: Diagnosis not present

## 2022-12-11 DIAGNOSIS — Z992 Dependence on renal dialysis: Secondary | ICD-10-CM | POA: Diagnosis not present

## 2022-12-11 DIAGNOSIS — D689 Coagulation defect, unspecified: Secondary | ICD-10-CM | POA: Diagnosis not present

## 2022-12-11 DIAGNOSIS — N186 End stage renal disease: Secondary | ICD-10-CM | POA: Diagnosis not present

## 2022-12-11 NOTE — Progress Notes (Signed)
ANNUAL DIABETIC FOOT EXAM  Subjective: Timothy [name] presents today for diabetic foot exam.  Chief Complaint  Patient presents with   Diabetes    DFC/ EXAM    Callouses    BILAT CALLUS   Patient confirms h/o diabetes.  Patient relates wound on right leg. States leg was itching and he has been scratching it. Denies any redness, drainage or swelling. He is on dialysis MWF.   [name], Buren Kos, MD is patient's PCP. LOV 12/03/2022.  Past Medical History:  Diagnosis Date   Allergies    Anemia    Blind left eye    CHF (congestive heart failure) (HCC)    Chronic kidney disease    stage 4   Diabetes mellitus without complication (HCC)    Dyspnea    Patient denies SOB since he started dialysis. States he went away after he started dialysis   HLD (hyperlipidemia)    Hypertension    Pneumonia    Patient Active Problem List   Diagnosis Date Noted   Heme positive stool 12/05/2022   Generalized abdominal discomfort 12/05/2022   Polyp of ascending colon 12/05/2022   Gastritis and gastroduodenitis 12/05/2022   Gastric polyp 12/05/2022   Foot callus 12/03/2022   Hematemesis 08/05/2022   Erectile dysfunction 01/18/2021   Pneumonia 11/15/2020   Hemoptysis 11/15/2020   CKD (chronic kidney disease), stage IV (HCC) 11/15/2020   Positive D dimer 11/15/2020   Knee injury, right, subsequent encounter 09/07/2020   Type 2 diabetes mellitus (HCC) 08/22/2020   Acute respiratory failure with hypoxia (HCC) 06/22/2020   Multifocal pneumonia 06/22/2020   Accelerated hypertension 06/20/2019   Elevated troponin 06/19/2019   Chronic diastolic CHF (congestive heart failure) (HCC) 06/19/2019   Hypoalbuminemia 06/19/2019   Hypertensive urgency 06/19/2019   AKI (acute kidney injury) (HCC) 06/19/2019   CHF (congestive heart failure) (HCC) 06/19/2019   Past Surgical History:  Procedure Laterality Date   A/V FISTULAGRAM Left 11/19/2022   Procedure: A/V Fistulagram;  Surgeon: Nada Libman, MD;  Location: [facility];  Service: Cardiovascular;  Laterality: Left;   AV FISTULA PLACEMENT Left 09/19/2022   Procedure: ARTERIOVENOUS (AV) FISTULA CREATION;  Surgeon: Nada Libman, MD;  Location: [facility] OR;  Service: Vascular;  Laterality: Left;   BIOPSY  12/05/2022   Procedure: BIOPSY;  Surgeon: Napoleon Form, MD;  Location: [facility];  Service: Gastroenterology;;   CATARACT EXTRACTION Right    COLONOSCOPY WITH PROPOFOL N/A 12/05/2022   Procedure: COLONOSCOPY WITH PROPOFOL;  Surgeon: Napoleon Form, MD;  Location: [facility];  Service: Gastroenterology;  Laterality: N/A;   ESOPHAGOGASTRODUODENOSCOPY (EGD) WITH PROPOFOL N/A 12/05/2022   Procedure: ESOPHAGOGASTRODUODENOSCOPY (EGD) WITH PROPOFOL;  Surgeon: Napoleon Form, MD;  Location: [facility];  Service: Gastroenterology;  Laterality: N/A;   MENISCUS REPAIR Left    PERIPHERAL VASCULAR INTERVENTION  11/19/2022   Procedure: PERIPHERAL VASCULAR INTERVENTION;  Surgeon: Nada Libman, MD;  Location: [facility];  Service: Cardiovascular;;  Embolization coil   POLYPECTOMY  12/05/2022   Procedure: POLYPECTOMY;  Surgeon: Napoleon Form, MD;  Location: [facility];  Service: Gastroenterology;;   Current Outpatient Medications on File Prior to Visit  Medication Sig Dispense Refill   amLODipine (NORVASC) 10 MG tablet Take 10 mg by mouth daily.     Continuous Glucose Sensor (FREESTYLE LIBRE 3 SENSOR) MISC See admin instructions. 09/17/22 - does not have     ENTRESTO 49-51 MG Take 1 tablet by mouth 2 (two) times  daily.     FARXIGA 10 MG TABS tablet Take 10 mg by mouth daily.     furosemide (LASIX) 80 MG tablet Take 80 mg by mouth daily.     glimepiride (AMARYL) 4 MG tablet Take 4 mg by mouth daily.     hydrALAZINE (APRESOLINE) 100 MG tablet Take 50 mg by mouth 3 (three) times daily. MAY TAKE EXTRA 50MG  FOR SBP>160     hydrOXYzine (VISTARIL) 25 MG capsule Take 25 mg by mouth at bedtime.      insulin glargine (LANTUS SOLOSTAR) 100 UNIT/ML Solostar Pen INJECT 5 UINTS INTO THE SKIN DAILY 15 mL 1   Insulin Pen Needle (PEN NEEDLES) 31G X 5 MM MISC 1 each by Does not apply route daily. 100 each 1   isosorbide mononitrate (IMDUR) 30 MG 24 hr tablet Take 30 mg by mouth daily.     losartan (COZAAR) 25 MG tablet Take 25 mg by mouth daily.     metolazone (ZAROXOLYN) 2.5 MG tablet Take 2.5 mg by mouth daily.     metoprolol succinate (TOPROL-XL) 100 MG 24 hr tablet Take 1 tablet (100 mg total) by mouth daily. 30 tablet 3   OZEMPIC, 0.25 OR 0.5 MG/DOSE, 2 MG/3ML SOPN Inject into the skin. 09/17/22 Is not taking     pantoprazole (PROTONIX) 40 MG tablet Take 1 tablet (40 mg total) by mouth 2 (two) times daily before a meal. 180 tablet 3   potassium chloride SA (KLOR-CON M) 20 MEQ tablet Take 20 mEq by mouth daily.     spironolactone (ALDACTONE) 25 MG tablet Take 25 mg by mouth daily.     torsemide (DEMADEX) 20 MG tablet Take 20 mg by mouth 2 (two) times daily.     Current Facility-Administered Medications on File Prior to Visit  Medication Dose Route Frequency Provider Last Rate Last Admin   0.9 %  sodium chloride infusion  250 mL Intravenous PRN Nada Libman, MD       sodium chloride flush (NS) 0.9 % injection 3 mL  3 mL Intravenous Q12H Nada Libman, MD       sodium chloride flush (NS) 0.9 % injection 3 mL  3 mL Intravenous PRN Nada Libman, MD        No Known Allergies Social History   Occupational History   Occupation: disabled  Tobacco Use   Smoking status: Never   Smokeless tobacco: Never  Vaping Use   Vaping status: Never Used  Substance and Sexual Activity   Alcohol use: Not Currently   Drug use: Not Currently   Sexual activity: Not on file   Family History  Problem Relation Age of Onset   Hypertension Mother    Diabetes Mother    Heart attack Mother    Diabetes Brother    Immunization History  Administered Date(s) Administered   Pneumococcal  Polysaccharide-23 09/07/2020     Review of Systems: Negative except as noted in the HPI.   Objective: There were no vitals filed for this visit.  Timothy [name] is a pleasant 52 y.o. male in NAD. AAO X 3.   Title   Diabetic Foot Exam - detailed Date & Time: 12/10/2022 10:30 AM Diabetic Foot exam was performed with the following findings: Yes  Visual Foot Exam completed.: Yes  Is there a history of foot ulcer?: No Is there a foot ulcer now?: Yes Is there swelling?: No Is there elevated skin temperature?: No Is there abnormal foot shape?: No Is  there a claw toe deformity?: No Are the toenails long?: Yes Are the toenails thick?: Yes Are the toenails ingrown?: No Is the skin thin, fragile, shiny and hairless?": Yes Normal Range of Motion?: Yes Is there foot or ankle muscle weakness?: No Do you have pain in calf while walking?: No Are the shoes appropriate in style and fit?: No Can the patient see the bottom of their feet?: Yes Pulse Foot Exam completed.: Yes   Right Posterior Tibialis: Present Left posterior Tibialis: Present   Right Dorsalis Pedis: Present Left Dorsalis Pedis: Present     Sensory Foot Exam Completed.: Yes Semmes-Weinstein Monofilament Test "+" means "has sensation" and "-" means "no sensation"  R Foot Test Control: Pos L Foot Test Control: Pos   R Site 1-Great Toe: Pos L Site 1-Great Toe: Pos   R Site 4: Pos L Site 4: Pos   R site 5: Pos L Site 5: Pos  R Site 6: Pos L Site 6: Pos     Image components are not supported.   Image components are not supported. Image components are not supported.  [place] Right vibratory: present Left vibratory: present  Comments Dermatological: Open large superficial wound noted anterior aspect right shin (see photo). No active bleeding, no purulence, no odor. Dressing of band-aids clean, dry and intact. Pedal skin thin, shiny and atrophic b/l LE. No interdigital macerations noted b/l LE. Toenails 1-5 b/l  elongated, discolored, dystrophic, thickened, crumbly with subungual debris and tenderness to dorsal palpation. Porokeratotic lesion(s) submet head 2 left foot. No erythema, no edema, no drainage, no fluctuance. Preulcerative lesion noted submet head 5 right foot. There is visible subdermal hemorrhage. There is no surrounding erythema, no edema, no drainage, no odor, no fluctuance.  Musculoskeletal: Muscle strength 5/5 to all lower extremity muscle groups bilaterally. Plantarflexed metatarsal(s) 2nd metatarsal head left foot and 5th metatarsal head right foot.      Lab Results  Component Value Date   HGBA1C 5.8 (H) 08/05/2022   ADA Risk Categorization: High Risk  Patient has one or more of the following: Loss of protective sensation Absent pedal pulses Severe Foot deformity History of foot ulcer  Assessment: 1. Pain due to onychomycosis of toenails of both feet   2. Pre-ulcerative calluses   3. Porokeratosis   4. Open wound of right lower leg, initial encounter   5. Type 2 diabetes mellitus with chronic kidney disease on chronic dialysis, with long-term current use of insulin (HCC)   6. Encounter for diabetic foot exam (HCC)     Plan: Orders Placed This Encounter  Procedures   For Home Use Only DME Diabetic Shoe    Dispense one pair extra depth shoes and 3 pair custom molded inserts. Offload preulcerative callus submet head 5 right foot and porokeratosis submet head 2 left foot.   AMB referral to wound care center    Referral Priority:   Urgent    Referral Type:   Consultation    Referred to Provider:   Camelia Phenes, DO    Number of Visits Requested:   1   Meds ordered this encounter  Medications   gentamicin cream (GARAMYCIN) 0.1 %    Sig: Apply 1 Application topically daily. Apply to right leg once daily.    Dispense:  30 g    Refill:  0   AMB REFERRAL TO WOUND CARE CENTER FOR HOME USE ONLY DME DIABETIC SHOE  -Patient was evaluated today. All  questions/concerns addressed on today's visit. -Discussed  open wound. Rx for Gentamicin Cream to be applied once daily. Will refer to Wound Care for management. Patient related understanding. -Diabetic foot examination performed today. -Discussed and educated patient on diabetic foot care, especially with  regards to the vascular, neurological and musculoskeletal systems. -Continue diabetic foot care principles: inspect feet daily, monitor glucose as recommended by PCP and/or Endocrinologist, and follow prescribed diet per PCP, Endocrinologist and/or dietician. -Discussed diabetes shoe benefit available based on patient's diagnoses. Patient would like to proceed. Order entered for one pair extra depth shoes and 3 pair custom insoles for b/l lower extremities . Patient qualifies based on diagnoses. -Toenails 1-5 b/l were debrided in length and girth with sterile nail nippers and dremel without iatrogenic bleeding.  -Preulcerative lesion pared submet head 5 right foot utilizing sterile scalpel blade. Total number pared=1. -Porokeratotic lesion(s) submet head 2 left foot pared and enucleated with sterile currette without incident. Total number of lesions debrided=1. -Referral placed to Wound Care for management of right leg wound. -Patient/POA to call should there be question/concern in the interim. Return in about 3 months (around 03/12/2023).  Freddie Breech, DPM

## 2022-12-13 ENCOUNTER — Encounter: Payer: Self-pay | Admitting: General Practice

## 2022-12-13 ENCOUNTER — Ambulatory Visit: Payer: 59 | Attending: General Practice | Admitting: General Practice

## 2022-12-13 ENCOUNTER — Encounter: Payer: Self-pay | Admitting: Podiatry

## 2022-12-13 VITALS — BP 124/60 | HR 81 | Ht 66.0 in | Wt 171.0 lb

## 2022-12-13 DIAGNOSIS — R509 Fever, unspecified: Secondary | ICD-10-CM | POA: Diagnosis not present

## 2022-12-13 DIAGNOSIS — I5032 Chronic diastolic (congestive) heart failure: Secondary | ICD-10-CM

## 2022-12-13 DIAGNOSIS — D509 Iron deficiency anemia, unspecified: Secondary | ICD-10-CM | POA: Diagnosis not present

## 2022-12-13 DIAGNOSIS — I10 Essential (primary) hypertension: Secondary | ICD-10-CM | POA: Diagnosis not present

## 2022-12-13 DIAGNOSIS — Z992 Dependence on renal dialysis: Secondary | ICD-10-CM

## 2022-12-13 DIAGNOSIS — E785 Hyperlipidemia, unspecified: Secondary | ICD-10-CM | POA: Diagnosis not present

## 2022-12-13 DIAGNOSIS — N184 Chronic kidney disease, stage 4 (severe): Secondary | ICD-10-CM

## 2022-12-13 DIAGNOSIS — E1122 Type 2 diabetes mellitus with diabetic chronic kidney disease: Secondary | ICD-10-CM | POA: Diagnosis not present

## 2022-12-13 DIAGNOSIS — N2581 Secondary hyperparathyroidism of renal origin: Secondary | ICD-10-CM | POA: Diagnosis not present

## 2022-12-13 DIAGNOSIS — D689 Coagulation defect, unspecified: Secondary | ICD-10-CM | POA: Diagnosis not present

## 2022-12-13 DIAGNOSIS — N186 End stage renal disease: Secondary | ICD-10-CM | POA: Diagnosis not present

## 2022-12-13 DIAGNOSIS — D631 Anemia in chronic kidney disease: Secondary | ICD-10-CM | POA: Diagnosis not present

## 2022-12-13 NOTE — Patient Instructions (Signed)
Medication Instructions:  The current medical regimen is effective;  continue present plan and medications as directed. Please refer to the Current Medication list given to you today.  *If you need a refill on your cardiac medications before your next appointment, please call your pharmacy*  Lab Work: FASTING LIPID IN [place] If you have labs (blood work) drawn today and your tests are completely normal, you will receive your results only by:  MyChart Message (if you have MyChart) OR A paper copy in the mail If you have any lab test that is abnormal or we need to change your treatment, we will call you to review the results.  Other Instructions IF YOU HAVE ANY BLOODY SPUTUM CONTACT YOUR PRIMARY MD MAINTAIN PHYSICAL ACTIVITY  Follow-Up: At [facility], you and your health needs are our priority.  As part of our continuing mission to provide you with exceptional heart care, we have created designated Provider Care Teams.  These Care Teams include your primary Cardiologist (physician) and Advanced Practice Providers (APPs -  Physician Assistants and Nurse Practitioners) who all work together to provide you with the care you need, when you need it.  We recommend signing up for the patient portal called "MyChart".  Sign up information is provided on this After Visit Summary.  MyChart is used to connect with patients for Virtual Visits (Telemedicine).  Patients are able to view lab/test results, encounter notes, upcoming appointments, etc.  Non-urgent messages can be sent to your provider as well.   To learn more about what you can do with MyChart, go to [email or web address].    Your next appointment:   6 month(s)  Provider:   Little Ishikawa, MD  or Edd Fabian, FNP

## 2022-12-16 ENCOUNTER — Ambulatory Visit: Payer: Medicaid Other | Admitting: "Endocrinology

## 2022-12-16 DIAGNOSIS — D689 Coagulation defect, unspecified: Secondary | ICD-10-CM | POA: Diagnosis not present

## 2022-12-16 DIAGNOSIS — R509 Fever, unspecified: Secondary | ICD-10-CM | POA: Diagnosis not present

## 2022-12-16 DIAGNOSIS — N2581 Secondary hyperparathyroidism of renal origin: Secondary | ICD-10-CM | POA: Diagnosis not present

## 2022-12-16 DIAGNOSIS — D631 Anemia in chronic kidney disease: Secondary | ICD-10-CM | POA: Diagnosis not present

## 2022-12-16 DIAGNOSIS — N186 End stage renal disease: Secondary | ICD-10-CM | POA: Diagnosis not present

## 2022-12-16 DIAGNOSIS — Z992 Dependence on renal dialysis: Secondary | ICD-10-CM | POA: Diagnosis not present

## 2022-12-16 DIAGNOSIS — D509 Iron deficiency anemia, unspecified: Secondary | ICD-10-CM | POA: Diagnosis not present

## 2022-12-16 DIAGNOSIS — E1122 Type 2 diabetes mellitus with diabetic chronic kidney disease: Secondary | ICD-10-CM | POA: Diagnosis not present

## 2022-12-18 ENCOUNTER — Ambulatory Visit (HOSPITAL_BASED_OUTPATIENT_CLINIC_OR_DEPARTMENT_OTHER): Payer: 59 | Admitting: Orthopaedic Surgery

## 2022-12-18 ENCOUNTER — Ambulatory Visit (HOSPITAL_BASED_OUTPATIENT_CLINIC_OR_DEPARTMENT_OTHER): Payer: 59

## 2022-12-18 DIAGNOSIS — D689 Coagulation defect, unspecified: Secondary | ICD-10-CM | POA: Diagnosis not present

## 2022-12-18 DIAGNOSIS — M25561 Pain in right knee: Secondary | ICD-10-CM | POA: Diagnosis not present

## 2022-12-18 DIAGNOSIS — D509 Iron deficiency anemia, unspecified: Secondary | ICD-10-CM | POA: Diagnosis not present

## 2022-12-18 DIAGNOSIS — N2581 Secondary hyperparathyroidism of renal origin: Secondary | ICD-10-CM | POA: Diagnosis not present

## 2022-12-18 DIAGNOSIS — Z992 Dependence on renal dialysis: Secondary | ICD-10-CM | POA: Diagnosis not present

## 2022-12-18 DIAGNOSIS — G8929 Other chronic pain: Secondary | ICD-10-CM | POA: Diagnosis not present

## 2022-12-18 DIAGNOSIS — R509 Fever, unspecified: Secondary | ICD-10-CM | POA: Diagnosis not present

## 2022-12-18 DIAGNOSIS — E1122 Type 2 diabetes mellitus with diabetic chronic kidney disease: Secondary | ICD-10-CM | POA: Diagnosis not present

## 2022-12-18 DIAGNOSIS — M1711 Unilateral primary osteoarthritis, right knee: Secondary | ICD-10-CM | POA: Diagnosis not present

## 2022-12-18 DIAGNOSIS — N186 End stage renal disease: Secondary | ICD-10-CM | POA: Diagnosis not present

## 2022-12-18 DIAGNOSIS — D631 Anemia in chronic kidney disease: Secondary | ICD-10-CM | POA: Diagnosis not present

## 2022-12-18 NOTE — Progress Notes (Signed)
Chief Complaint: Right knee pain     History of Present Illness:    Timothy [name] is a 52 y.o. male presents with right knee pain in the setting of a previous injury.  He states that many years prior when he was playing basketball he did have an injury and a pop.  Subsequently he was told he had a meniscus injury.  He was told that this required surgery but unfortunately was not able to have this due to insurance issues.  He has subsequently moved to [place].  He coaches football.  He does have a history of kidney disease from diabetes although his most recent A1c was 6.  He is on dialysis.  He is having a hard time with any type of walking as the knee continues to buckle and is very painful.    PMH/PSH/Family History/Social History/Meds/Allergies:    Past Medical History:  Diagnosis Date   Allergies    Anemia    Blind left eye    CHF (congestive heart failure) (HCC)    Chronic kidney disease    stage 4   Diabetes mellitus without complication (HCC)    Dyspnea    Patient denies SOB since he started dialysis. States he went away after he started dialysis   HLD (hyperlipidemia)    Hypertension    Pneumonia    Past Surgical History:  Procedure Laterality Date   A/V FISTULAGRAM Left 11/19/2022   Procedure: A/V Fistulagram;  Surgeon: Nada Libman, MD;  Location: [facility];  Service: Cardiovascular;  Laterality: Left;   AV FISTULA PLACEMENT Left 09/19/2022   Procedure: ARTERIOVENOUS (AV) FISTULA CREATION;  Surgeon: Nada Libman, MD;  Location: [facility] OR;  Service: Vascular;  Laterality: Left;   BIOPSY  12/05/2022   Procedure: BIOPSY;  Surgeon: Napoleon Form, MD;  Location: [facility];  Service: Gastroenterology;;   CATARACT EXTRACTION Right    COLONOSCOPY WITH PROPOFOL N/A 12/05/2022   Procedure: COLONOSCOPY WITH PROPOFOL;  Surgeon: Napoleon Form, MD;  Location: [facility];  Service: Gastroenterology;  Laterality: N/A;    ESOPHAGOGASTRODUODENOSCOPY (EGD) WITH PROPOFOL N/A 12/05/2022   Procedure: ESOPHAGOGASTRODUODENOSCOPY (EGD) WITH PROPOFOL;  Surgeon: Napoleon Form, MD;  Location: [facility];  Service: Gastroenterology;  Laterality: N/A;   MENISCUS REPAIR Left    PERIPHERAL VASCULAR INTERVENTION  11/19/2022   Procedure: PERIPHERAL VASCULAR INTERVENTION;  Surgeon: Nada Libman, MD;  [facility]: [facility];  Service: Cardiovascular;;  Embolization coil   POLYPECTOMY  12/05/2022   Procedure: POLYPECTOMY;  Surgeon: Napoleon Form, MD;  Location: [facility];  Service: Gastroenterology;;   Social History   Socioeconomic History   Marital status: Single    Spouse name: Not on file   Number of children: 1   Years of education: Not on file   Highest education level: Not on file  Occupational History   Occupation: disabled  Tobacco Use   Smoking status: Never   Smokeless tobacco: Never  Vaping Use   Vaping status: Never Used  Substance and Sexual Activity   Alcohol use: Not Currently   Drug use: Not Currently   Sexual activity: Not on file  Other Topics Concern   Not on file  Social History Narrative   Not on file   Social Determinants of Health   Financial Resource Strain: Not on file  Food Insecurity: Not on file  Transportation Needs: Not on file  Physical Activity: Not on file  Stress: Not on  file  Social Connections: Not on file   Family History  Problem Relation Age of Onset   Hypertension Mother    Diabetes Mother    Heart attack Mother    Diabetes Brother    No Known Allergies Current Outpatient Medications  Medication Sig Dispense Refill   amLODipine (NORVASC) 10 MG tablet Take 10 mg by mouth daily.     Continuous Glucose Sensor (FREESTYLE LIBRE 3 SENSOR) MISC See admin instructions. 09/17/22 - does not have (Patient not taking: Reported on 12/13/2022)     ENTRESTO 49-51 MG Take 1 tablet by mouth 2 (two) times daily.     FARXIGA 10 MG TABS tablet Take 10 mg  by mouth daily.     furosemide (LASIX) 80 MG tablet Take 80 mg by mouth daily.     gentamicin cream (GARAMYCIN) 0.1 % Apply 1 Application topically daily. Apply to right leg once daily. 30 g 0   glimepiride (AMARYL) 4 MG tablet Take 4 mg by mouth daily.     hydrALAZINE (APRESOLINE) 100 MG tablet Take 50 mg by mouth 3 (three) times daily. MAY TAKE EXTRA 50MG  FOR SBP>160     hydrOXYzine (VISTARIL) 25 MG capsule Take 25 mg by mouth at bedtime.     insulin glargine (LANTUS SOLOSTAR) 100 UNIT/ML Solostar Pen INJECT 5 UINTS INTO THE SKIN DAILY 15 mL 1   Insulin Pen Needle (PEN NEEDLES) 31G X 5 MM MISC 1 each by Does not apply route daily. 100 each 1   isosorbide mononitrate (IMDUR) 30 MG 24 hr tablet Take 30 mg by mouth daily.     labetalol (NORMODYNE) 200 MG tablet Take 400 mg by mouth 2 (two) times daily.     levocetirizine (XYZAL) 5 MG tablet Take 5 mg by mouth daily.     losartan (COZAAR) 25 MG tablet Take 25 mg by mouth daily.     metolazone (ZAROXOLYN) 2.5 MG tablet Take 2.5 mg by mouth daily.     metoprolol succinate (TOPROL-XL) 100 MG 24 hr tablet Take 1 tablet (100 mg total) by mouth daily. 30 tablet 3   OZEMPIC, 0.25 OR 0.5 MG/DOSE, 2 MG/3ML SOPN Inject into the skin. 09/17/22 Is not taking     pantoprazole (PROTONIX) 40 MG tablet Take 1 tablet (40 mg total) by mouth 2 (two) times daily before a meal. 180 tablet 3   potassium chloride SA (KLOR-CON M) 20 MEQ tablet Take 20 mEq by mouth daily.     spironolactone (ALDACTONE) 25 MG tablet Take 25 mg by mouth daily.     torsemide (DEMADEX) 20 MG tablet Take 20 mg by mouth 2 (two) times daily.     Current Facility-Administered Medications  Medication Dose Route Frequency Provider Last Rate Last Admin   0.9 %  sodium chloride infusion  250 mL Intravenous PRN Nada Libman, MD       sodium chloride flush (NS) 0.9 % injection 3 mL  3 mL Intravenous Q12H Nada Libman, MD       sodium chloride flush (NS) 0.9 % injection 3 mL  3 mL Intravenous  PRN Nada Libman, MD       No results found.  Review of Systems:   A ROS was performed including pertinent positives and negatives as documented in the HPI.  Physical Exam :   Constitutional: NAD and appears stated age Neurological: Alert and oriented Psych: Appropriate affect and cooperative There were no vitals taken for this visit.   Comprehensive Musculoskeletal  Exam:    Right knee with a proximately retracted patella.  He is not able to actively extend against gravity.  Range of motion of the right knee is from approximately 20 degrees to 40 degrees.  There is tenderness about the remaining trochlear groove.   Imaging:   Xray (4 views right knee): Evidence of an old patella tendon avulsion with significant retraction and osteoarthritis of the patella    I personally reviewed and interpreted the radiographs.   Assessment and Plan:   52 y.o. 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  Unfortunately I did discuss that this is quite a difficult issue.  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  I did discuss that ultimately I would like to obtain an MRI of the right knee to assess to what degree his patella does have osteoarthritis.  If there is significant arthritis I did share that I would be hesitant to perform a repair of the patella tendon as this may lead to ultimate patellofemoral type pain in the setting of osteoarthritis.  I did discuss that ultimately he may benefit from a patellectomy me in order to advance the patella tendon and relieve arthritic type symptoms.  That being said I would defer and wait for an MRI prior to making this decision.  Plan for MRI right knee and follow-up discuss results   I personally saw and evaluated the patient, and participated in the management and treatment plan.  Huel Cote, MD Attending Physician, Orthopedic Surgery  This  document was dictated using Dragon voice recognition software. A reasonable attempt at proof reading has been made to minimize errors.

## 2022-12-19 ENCOUNTER — Ambulatory Visit (INDEPENDENT_AMBULATORY_CARE_PROVIDER_SITE_OTHER): Payer: 59 | Admitting: Family Medicine

## 2022-12-19 ENCOUNTER — Encounter (HOSPITAL_BASED_OUTPATIENT_CLINIC_OR_DEPARTMENT_OTHER): Payer: Self-pay | Admitting: Family Medicine

## 2022-12-19 VITALS — BP 149/84 | HR 66 | Ht 66.0 in | Wt 176.0 lb

## 2022-12-19 DIAGNOSIS — E1122 Type 2 diabetes mellitus with diabetic chronic kidney disease: Secondary | ICD-10-CM

## 2022-12-19 DIAGNOSIS — Z0000 Encounter for general adult medical examination without abnormal findings: Secondary | ICD-10-CM

## 2022-12-19 MED ORDER — FREESTYLE LIBRE 3 SENSOR MISC: 1.0000 | 1 refills | Status: AC

## 2022-12-19 NOTE — Progress Notes (Signed)
Subjective:    Timothy [name] is a 52 y.o. male who presents for a Welcome to Medicare exam.   Cardiac Risk Factors include: diabetes mellitus;hypertension;male gender     Objective:    Today's Vitals   12/19/22 1301 12/19/22 1302  BP:  (!) 155/87  Pulse:  66  SpO2:  100%  Weight: 175 lb 9.6 oz (79.7 kg) 176 lb (79.8 kg)  Height: 5\' 6"  (1.676 m) 5\' 6"  (1.676 m)  PainSc:  0-No pain   Body mass index is 28.41 kg/m.  Medications Outpatient Encounter Medications as of 12/19/2022  Medication Sig   amLODipine (NORVASC) 10 MG tablet Take 10 mg by mouth daily.   Continuous Glucose Sensor (FREESTYLE LIBRE 3 SENSOR) MISC See admin instructions. 09/17/22 - does not have   ENTRESTO 49-51 MG Take 1 tablet by mouth 2 (two) times daily.   FARXIGA 10 MG TABS tablet Take 10 mg by mouth daily.   furosemide (LASIX) 80 MG tablet Take 80 mg by mouth daily.   gentamicin cream (GARAMYCIN) 0.1 % Apply 1 Application topically daily. Apply to right leg once daily.   glimepiride (AMARYL) 4 MG tablet Take 4 mg by mouth daily.   hydrALAZINE (APRESOLINE) 100 MG tablet Take 50 mg by mouth 3 (three) times daily. MAY TAKE EXTRA 50MG  FOR SBP>160   hydrOXYzine (VISTARIL) 25 MG capsule Take 25 mg by mouth at bedtime.   insulin glargine (LANTUS SOLOSTAR) 100 UNIT/ML Solostar Pen INJECT 5 UINTS INTO THE SKIN DAILY   Insulin Pen Needle (PEN NEEDLES) 31G X 5 MM MISC 1 each by Does not apply route daily.   isosorbide mononitrate (IMDUR) 30 MG 24 hr tablet Take 30 mg by mouth daily.   labetalol (NORMODYNE) 200 MG tablet Take 400 mg by mouth 2 (two) times daily.   levocetirizine (XYZAL) 5 MG tablet Take 5 mg by mouth daily.   losartan (COZAAR) 25 MG tablet Take 25 mg by mouth daily.   metolazone (ZAROXOLYN) 2.5 MG tablet Take 2.5 mg by mouth daily.   metoprolol succinate (TOPROL-XL) 100 MG 24 hr tablet Take 1 tablet (100 mg total) by mouth daily.   OZEMPIC, 0.25 OR 0.5 MG/DOSE, 2 MG/3ML SOPN Inject into the  skin. 09/17/22 Is not taking   pantoprazole (PROTONIX) 40 MG tablet Take 1 tablet (40 mg total) by mouth 2 (two) times daily before a meal.   potassium chloride SA (KLOR-CON M) 20 MEQ tablet Take 20 mEq by mouth daily.   spironolactone (ALDACTONE) 25 MG tablet Take 25 mg by mouth daily.   torsemide (DEMADEX) 20 MG tablet Take 20 mg by mouth 2 (two) times daily.   Facility-Administered Encounter Medications as of 12/19/2022  Medication   0.9 %  sodium chloride infusion   sodium chloride flush (NS) 0.9 % injection 3 mL   sodium chloride flush (NS) 0.9 % injection 3 mL     History: Past Medical History:  Diagnosis Date   Allergies    Anemia    Blind left eye    CHF (congestive heart failure) (HCC)    Chronic kidney disease    stage 4   Diabetes mellitus without complication (HCC)    Dyspnea    Patient denies SOB since he started dialysis. States he went away after he started dialysis   HLD (hyperlipidemia)    Hypertension    Pneumonia    Past Surgical History:  Procedure Laterality Date   A/V FISTULAGRAM Left 11/19/2022   Procedure:  A/V Fistulagram;  Surgeon: Nada Libman, MD;  Location: [facility];  Service: Cardiovascular;  Laterality: Left;   AV FISTULA PLACEMENT Left 09/19/2022   Procedure: ARTERIOVENOUS (AV) FISTULA CREATION;  Surgeon: Nada Libman, MD;  Location: [facility] OR;  Service: Vascular;  Laterality: Left;   BIOPSY  12/05/2022   Procedure: BIOPSY;  Surgeon: Napoleon Form, MD;  Location: [facility];  Service: Gastroenterology;;   CATARACT EXTRACTION Right    COLONOSCOPY WITH PROPOFOL N/A 12/05/2022   Procedure: COLONOSCOPY WITH PROPOFOL;  Surgeon: Napoleon Form, MD;  Location: [facility];  Service: Gastroenterology;  Laterality: N/A;   ESOPHAGOGASTRODUODENOSCOPY (EGD) WITH PROPOFOL N/A 12/05/2022   Procedure: ESOPHAGOGASTRODUODENOSCOPY (EGD) WITH PROPOFOL;  Surgeon: Napoleon Form, MD;  Location: [facility];  Service:  Gastroenterology;  Laterality: N/A;   MENISCUS REPAIR Left    PERIPHERAL VASCULAR INTERVENTION  11/19/2022   Procedure: PERIPHERAL VASCULAR INTERVENTION;  Surgeon: Nada Libman, MD;  Location: [facility];  Service: Cardiovascular;;  Embolization coil   POLYPECTOMY  12/05/2022   Procedure: POLYPECTOMY;  Surgeon: Napoleon Form, MD;  Location: [facility];  Service: Gastroenterology;;    Family History  Problem Relation Age of Onset   Hypertension Mother    Diabetes Mother    Heart attack Mother    Diabetes Brother    Social History   Occupational History   Occupation: disabled  Tobacco Use   Smoking status: Never    Passive exposure: Never   Smokeless tobacco: Never  Vaping Use   Vaping status: Never Used  Substance and Sexual Activity   Alcohol use: Not Currently   Drug use: Not Currently   Sexual activity: Not on file    Tobacco Counseling Counseling given: Not Answered   Immunizations and Health Maintenance Immunization History  Administered Date(s) Administered   Pneumococcal Polysaccharide-23 09/07/2020   Health Maintenance Due  Topic Date Due   Hepatitis C Screening  Never done   DTaP/Tdap/Td (1 - Tdap) Never done   Zoster Vaccines- Shingrix (1 of 2) Never done   COVID-19 Vaccine (1 - 2023-24 season) Never done    Activities of Daily Living    12/19/2022    1:04 PM 09/19/2022    6:25 AM  In your present state of health, do you have any difficulty performing the following activities:  Hearing? 0   Vision? 1   Difficulty concentrating or making decisions? 0   Walking or climbing stairs? 0   Dressing or bathing? 0   Doing errands, shopping? 0 0  Preparing Food and eating ? N   Using the Toilet? N   In the past six months, have you accidently leaked urine? N   Do you have problems with loss of bowel control? N   Managing your Medications? N   Managing your Finances? N   Housekeeping or managing your Housekeeping? N     Physical  Exam   Physical Exam (optional), or other factors deemed appropriate based on the beneficiary's medical and social history and current clinical standards.  Advanced Directives: Does Patient Have a Medical Advance Directive?: No Would patient like information on creating a medical advance directive?: No - Patient declined He would prefer for his daughter to make medical decisions for him if he was not able to do so for himself.   EKG:  normal EKG, normal sinus rhythm, unchanged from previous tracings, Patient completed EKG in July     Assessment:    This is  a routine wellness  examination for this patient . No acute concerns today. Is requesting for prescription to be sent to pharmacy for CGM device.  Vision/Hearing screen No results found.   Goals      Patient Stated     Patient would like to get healthier in general (knee fixed, get vision better)         Depression Screen    12/19/2022    1:10 PM 12/03/2022    1:17 PM 08/05/2022    1:57 PM 07/31/2021    1:17 PM  PHQ 2/9 Scores  PHQ - 2 Score 0 0 3 0  PHQ- 9 Score 0 0 10   Exception Documentation   Medical reason Medical reason     Fall Risk    12/19/2022    1:09 PM  Fall Risk   Falls in the past year? 0  Number falls in past yr: 0  Injury with Fall? 0  Risk for fall due to : No Fall Risks  Follow up Falls evaluation completed    Cognitive Function    12/19/2022    1:10 PM  MMSE - Mini Mental State Exam  Not completed: Unable to complete        12/19/2022    1:10 PM  6CIT Screen  What Year? 0 points  What month? 0 points  What time? 0 points  Count back from 20 0 points  Months in reverse 0 points  Repeat phrase 0 points  Total Score 0 points    Patient Care Team: [name], Buren Kos, MD as PCP - General (Family Medicine) Little Ishikawa, MD as PCP - Cardiology (Cardiology) Dr. Steward Drone - orthopedic surgery    Plan:   Continue with regular follow-up with specialists. Next appt with  me in about 2 months. Can return sooner if needed.  I have personally reviewed and noted the following in the patient's chart:   Medical and social history Use of alcohol, tobacco or illicit drugs  Current medications and supplements Functional ability and status Nutritional status Physical activity Advanced directives List of other physicians Hospitalizations, surgeries, and ER visits in previous 12 months Vitals Screenings to include cognitive, depression, and falls Referrals and appointments  In addition, I have reviewed and discussed with patient certain preventive protocols, quality metrics, and best practice recommendations. A written personalized care plan for preventive services as well as general preventive health recommendations were provided to patient.    ___________________________________________ Electra Paladino [name], MD, ABFM, CAQSM Primary Care and Sports Medicine [facility]

## 2022-12-20 DIAGNOSIS — R509 Fever, unspecified: Secondary | ICD-10-CM | POA: Diagnosis not present

## 2022-12-20 DIAGNOSIS — D631 Anemia in chronic kidney disease: Secondary | ICD-10-CM | POA: Diagnosis not present

## 2022-12-20 DIAGNOSIS — Z992 Dependence on renal dialysis: Secondary | ICD-10-CM | POA: Diagnosis not present

## 2022-12-20 DIAGNOSIS — D509 Iron deficiency anemia, unspecified: Secondary | ICD-10-CM | POA: Diagnosis not present

## 2022-12-20 DIAGNOSIS — D689 Coagulation defect, unspecified: Secondary | ICD-10-CM | POA: Diagnosis not present

## 2022-12-20 DIAGNOSIS — E1122 Type 2 diabetes mellitus with diabetic chronic kidney disease: Secondary | ICD-10-CM | POA: Diagnosis not present

## 2022-12-20 DIAGNOSIS — N2581 Secondary hyperparathyroidism of renal origin: Secondary | ICD-10-CM | POA: Diagnosis not present

## 2022-12-20 DIAGNOSIS — N186 End stage renal disease: Secondary | ICD-10-CM | POA: Diagnosis not present

## 2022-12-23 DIAGNOSIS — N186 End stage renal disease: Secondary | ICD-10-CM | POA: Diagnosis not present

## 2022-12-23 DIAGNOSIS — D509 Iron deficiency anemia, unspecified: Secondary | ICD-10-CM | POA: Diagnosis not present

## 2022-12-23 DIAGNOSIS — D689 Coagulation defect, unspecified: Secondary | ICD-10-CM | POA: Diagnosis not present

## 2022-12-23 DIAGNOSIS — E1122 Type 2 diabetes mellitus with diabetic chronic kidney disease: Secondary | ICD-10-CM | POA: Diagnosis not present

## 2022-12-23 DIAGNOSIS — R509 Fever, unspecified: Secondary | ICD-10-CM | POA: Diagnosis not present

## 2022-12-23 DIAGNOSIS — D631 Anemia in chronic kidney disease: Secondary | ICD-10-CM | POA: Diagnosis not present

## 2022-12-23 DIAGNOSIS — N2581 Secondary hyperparathyroidism of renal origin: Secondary | ICD-10-CM | POA: Diagnosis not present

## 2022-12-23 DIAGNOSIS — Z992 Dependence on renal dialysis: Secondary | ICD-10-CM | POA: Diagnosis not present

## 2022-12-25 DIAGNOSIS — N2581 Secondary hyperparathyroidism of renal origin: Secondary | ICD-10-CM | POA: Diagnosis not present

## 2022-12-25 DIAGNOSIS — N186 End stage renal disease: Secondary | ICD-10-CM | POA: Diagnosis not present

## 2022-12-25 DIAGNOSIS — Z992 Dependence on renal dialysis: Secondary | ICD-10-CM | POA: Diagnosis not present

## 2022-12-25 DIAGNOSIS — D689 Coagulation defect, unspecified: Secondary | ICD-10-CM | POA: Diagnosis not present

## 2022-12-25 DIAGNOSIS — R509 Fever, unspecified: Secondary | ICD-10-CM | POA: Diagnosis not present

## 2022-12-25 DIAGNOSIS — D631 Anemia in chronic kidney disease: Secondary | ICD-10-CM | POA: Diagnosis not present

## 2022-12-25 DIAGNOSIS — E1122 Type 2 diabetes mellitus with diabetic chronic kidney disease: Secondary | ICD-10-CM | POA: Diagnosis not present

## 2022-12-25 DIAGNOSIS — D509 Iron deficiency anemia, unspecified: Secondary | ICD-10-CM | POA: Diagnosis not present

## 2022-12-27 DIAGNOSIS — D509 Iron deficiency anemia, unspecified: Secondary | ICD-10-CM | POA: Diagnosis not present

## 2022-12-27 DIAGNOSIS — D689 Coagulation defect, unspecified: Secondary | ICD-10-CM | POA: Diagnosis not present

## 2022-12-27 DIAGNOSIS — D631 Anemia in chronic kidney disease: Secondary | ICD-10-CM | POA: Diagnosis not present

## 2022-12-27 DIAGNOSIS — N2581 Secondary hyperparathyroidism of renal origin: Secondary | ICD-10-CM | POA: Diagnosis not present

## 2022-12-27 DIAGNOSIS — Z992 Dependence on renal dialysis: Secondary | ICD-10-CM | POA: Diagnosis not present

## 2022-12-27 DIAGNOSIS — R509 Fever, unspecified: Secondary | ICD-10-CM | POA: Diagnosis not present

## 2022-12-27 DIAGNOSIS — E1122 Type 2 diabetes mellitus with diabetic chronic kidney disease: Secondary | ICD-10-CM | POA: Diagnosis not present

## 2022-12-27 DIAGNOSIS — N186 End stage renal disease: Secondary | ICD-10-CM | POA: Diagnosis not present

## 2022-12-29 DIAGNOSIS — Z992 Dependence on renal dialysis: Secondary | ICD-10-CM | POA: Diagnosis not present

## 2022-12-29 DIAGNOSIS — D631 Anemia in chronic kidney disease: Secondary | ICD-10-CM | POA: Diagnosis not present

## 2022-12-29 DIAGNOSIS — D689 Coagulation defect, unspecified: Secondary | ICD-10-CM | POA: Diagnosis not present

## 2022-12-29 DIAGNOSIS — E1122 Type 2 diabetes mellitus with diabetic chronic kidney disease: Secondary | ICD-10-CM | POA: Diagnosis not present

## 2022-12-29 DIAGNOSIS — D509 Iron deficiency anemia, unspecified: Secondary | ICD-10-CM | POA: Diagnosis not present

## 2022-12-29 DIAGNOSIS — N2581 Secondary hyperparathyroidism of renal origin: Secondary | ICD-10-CM | POA: Diagnosis not present

## 2022-12-29 DIAGNOSIS — R509 Fever, unspecified: Secondary | ICD-10-CM | POA: Diagnosis not present

## 2022-12-29 DIAGNOSIS — N186 End stage renal disease: Secondary | ICD-10-CM | POA: Diagnosis not present

## 2022-12-30 ENCOUNTER — Encounter (HOSPITAL_COMMUNITY)
Admission: RE | Disposition: A | Discharge status: Home / Self Care | Payer: Self-pay | Source: Home / Self Care | Attending: Nephrology

## 2022-12-30 ENCOUNTER — Other Ambulatory Visit: Payer: Self-pay

## 2022-12-30 ENCOUNTER — Ambulatory Visit (HOSPITAL_COMMUNITY)
Admission: RE | Admit: 2022-12-30 | Discharge: 2022-12-30 | Disposition: A | Discharge status: Home / Self Care | Payer: 59 | Attending: Nephrology | Admitting: Nephrology

## 2022-12-30 SURGERY — A/V FISTULAGRAM
Anesthesia: LOCAL | Laterality: Left

## 2022-12-30 MED ORDER — ACETAMINOPHEN 325 MG PO TABS
650.0000 mg | ORAL_TABLET | ORAL | Status: DC | PRN
Start: 2022-12-30 — End: 2022-12-30

## 2022-12-30 MED ORDER — SODIUM CHLORIDE 0.9 % IV SOLN: INTRAVENOUS | Status: DC

## 2022-12-30 MED ORDER — SODIUM CHLORIDE 0.9% FLUSH: 3.0000 mL | INTRAVENOUS | Status: DC | PRN

## 2022-12-30 MED ORDER — SODIUM CHLORIDE 0.9% FLUSH: 3.0000 mL | Freq: Two times a day (BID) | INTRAVENOUS | Status: DC

## 2022-12-30 MED ORDER — ONDANSETRON HCL 4 MG/2ML IJ SOLN: 4.0000 mg | Freq: Four times a day (QID) | INTRAMUSCULAR | Status: DC | PRN

## 2022-12-30 NOTE — H&P (Signed)
Patient had a left upper arm fistulogram of the BCF by Dr. Myra Gianotti on 10/15 with attempted coil placement but because one end of the coil was in the lumen of the fistula the entire coil had to be removed. Unfortunately the dialysis center has not been able to cannulate the fistula with 2 needles and have only been able to cannulate with the arterial needle and return through the catheter.   Dr. Randie Heinz reviewed the films and there is no evidence of outflow/ arch stenosis, thus scheduling a f/u appt with Dr. Myra Gianotti for consideration of surgical ligation and possible elevation of the fistula.  Patient understands and agrees with the plan.

## 2022-12-31 DIAGNOSIS — N2581 Secondary hyperparathyroidism of renal origin: Secondary | ICD-10-CM | POA: Diagnosis not present

## 2022-12-31 DIAGNOSIS — D509 Iron deficiency anemia, unspecified: Secondary | ICD-10-CM | POA: Diagnosis not present

## 2022-12-31 DIAGNOSIS — N186 End stage renal disease: Secondary | ICD-10-CM | POA: Diagnosis not present

## 2022-12-31 DIAGNOSIS — Z992 Dependence on renal dialysis: Secondary | ICD-10-CM | POA: Diagnosis not present

## 2022-12-31 DIAGNOSIS — E1122 Type 2 diabetes mellitus with diabetic chronic kidney disease: Secondary | ICD-10-CM | POA: Diagnosis not present

## 2022-12-31 DIAGNOSIS — D689 Coagulation defect, unspecified: Secondary | ICD-10-CM | POA: Diagnosis not present

## 2022-12-31 DIAGNOSIS — R509 Fever, unspecified: Secondary | ICD-10-CM | POA: Diagnosis not present

## 2022-12-31 DIAGNOSIS — D631 Anemia in chronic kidney disease: Secondary | ICD-10-CM | POA: Diagnosis not present

## 2023-01-01 ENCOUNTER — Ambulatory Visit: Payer: 59 | Admitting: Nurse Practitioner

## 2023-01-01 ENCOUNTER — Ambulatory Visit: Payer: 59

## 2023-01-01 DIAGNOSIS — M2141 Flat foot [pes planus] (acquired), right foot: Secondary | ICD-10-CM

## 2023-01-01 DIAGNOSIS — E1122 Type 2 diabetes mellitus with diabetic chronic kidney disease: Secondary | ICD-10-CM

## 2023-01-01 DIAGNOSIS — L84 Corns and callosities: Secondary | ICD-10-CM

## 2023-01-01 DIAGNOSIS — E119 Type 2 diabetes mellitus without complications: Secondary | ICD-10-CM

## 2023-01-01 DIAGNOSIS — S81801A Unspecified open wound, right lower leg, initial encounter: Secondary | ICD-10-CM

## 2023-01-03 DIAGNOSIS — N2581 Secondary hyperparathyroidism of renal origin: Secondary | ICD-10-CM | POA: Diagnosis not present

## 2023-01-03 DIAGNOSIS — D689 Coagulation defect, unspecified: Secondary | ICD-10-CM | POA: Diagnosis not present

## 2023-01-03 DIAGNOSIS — E1122 Type 2 diabetes mellitus with diabetic chronic kidney disease: Secondary | ICD-10-CM | POA: Diagnosis not present

## 2023-01-03 DIAGNOSIS — D509 Iron deficiency anemia, unspecified: Secondary | ICD-10-CM | POA: Diagnosis not present

## 2023-01-03 DIAGNOSIS — D631 Anemia in chronic kidney disease: Secondary | ICD-10-CM | POA: Diagnosis not present

## 2023-01-03 DIAGNOSIS — N186 End stage renal disease: Secondary | ICD-10-CM | POA: Diagnosis not present

## 2023-01-03 DIAGNOSIS — R509 Fever, unspecified: Secondary | ICD-10-CM | POA: Diagnosis not present

## 2023-01-03 DIAGNOSIS — Z992 Dependence on renal dialysis: Secondary | ICD-10-CM | POA: Diagnosis not present

## 2023-01-04 DIAGNOSIS — N186 End stage renal disease: Secondary | ICD-10-CM | POA: Diagnosis not present

## 2023-01-04 DIAGNOSIS — E1122 Type 2 diabetes mellitus with diabetic chronic kidney disease: Secondary | ICD-10-CM | POA: Diagnosis not present

## 2023-01-04 DIAGNOSIS — Z992 Dependence on renal dialysis: Secondary | ICD-10-CM | POA: Diagnosis not present

## 2023-01-06 DIAGNOSIS — D631 Anemia in chronic kidney disease: Secondary | ICD-10-CM | POA: Diagnosis not present

## 2023-01-06 DIAGNOSIS — D509 Iron deficiency anemia, unspecified: Secondary | ICD-10-CM | POA: Diagnosis not present

## 2023-01-06 DIAGNOSIS — D689 Coagulation defect, unspecified: Secondary | ICD-10-CM | POA: Diagnosis not present

## 2023-01-06 DIAGNOSIS — N186 End stage renal disease: Secondary | ICD-10-CM | POA: Diagnosis not present

## 2023-01-06 DIAGNOSIS — E1122 Type 2 diabetes mellitus with diabetic chronic kidney disease: Secondary | ICD-10-CM | POA: Diagnosis not present

## 2023-01-06 DIAGNOSIS — N2581 Secondary hyperparathyroidism of renal origin: Secondary | ICD-10-CM | POA: Diagnosis not present

## 2023-01-06 DIAGNOSIS — Z992 Dependence on renal dialysis: Secondary | ICD-10-CM | POA: Diagnosis not present

## 2023-01-08 DIAGNOSIS — Z992 Dependence on renal dialysis: Secondary | ICD-10-CM | POA: Diagnosis not present

## 2023-01-08 DIAGNOSIS — D689 Coagulation defect, unspecified: Secondary | ICD-10-CM | POA: Diagnosis not present

## 2023-01-08 DIAGNOSIS — D631 Anemia in chronic kidney disease: Secondary | ICD-10-CM | POA: Diagnosis not present

## 2023-01-08 DIAGNOSIS — D509 Iron deficiency anemia, unspecified: Secondary | ICD-10-CM | POA: Diagnosis not present

## 2023-01-08 DIAGNOSIS — N186 End stage renal disease: Secondary | ICD-10-CM | POA: Diagnosis not present

## 2023-01-08 DIAGNOSIS — E1122 Type 2 diabetes mellitus with diabetic chronic kidney disease: Secondary | ICD-10-CM | POA: Diagnosis not present

## 2023-01-08 DIAGNOSIS — N2581 Secondary hyperparathyroidism of renal origin: Secondary | ICD-10-CM | POA: Diagnosis not present

## 2023-01-10 ENCOUNTER — Ambulatory Visit
Admission: RE | Admit: 2023-01-10 | Discharge: 2023-01-10 | Disposition: A | Discharge status: Home / Self Care | Payer: 59 | Source: Ambulatory Visit | Attending: Orthopaedic Surgery

## 2023-01-10 ENCOUNTER — Other Ambulatory Visit: Payer: Self-pay

## 2023-01-10 ENCOUNTER — Encounter: Payer: Self-pay | Admitting: Gastroenterology

## 2023-01-10 DIAGNOSIS — G8929 Other chronic pain: Secondary | ICD-10-CM

## 2023-01-10 DIAGNOSIS — D689 Coagulation defect, unspecified: Secondary | ICD-10-CM | POA: Diagnosis not present

## 2023-01-10 DIAGNOSIS — N2581 Secondary hyperparathyroidism of renal origin: Secondary | ICD-10-CM | POA: Diagnosis not present

## 2023-01-10 DIAGNOSIS — D509 Iron deficiency anemia, unspecified: Secondary | ICD-10-CM | POA: Diagnosis not present

## 2023-01-10 DIAGNOSIS — N186 End stage renal disease: Secondary | ICD-10-CM | POA: Diagnosis not present

## 2023-01-10 DIAGNOSIS — M2291 Unspecified disorder of patella, right knee: Secondary | ICD-10-CM | POA: Diagnosis not present

## 2023-01-10 DIAGNOSIS — M25461 Effusion, right knee: Secondary | ICD-10-CM | POA: Diagnosis not present

## 2023-01-10 DIAGNOSIS — E1122 Type 2 diabetes mellitus with diabetic chronic kidney disease: Secondary | ICD-10-CM | POA: Diagnosis not present

## 2023-01-10 DIAGNOSIS — Z992 Dependence on renal dialysis: Secondary | ICD-10-CM | POA: Diagnosis not present

## 2023-01-10 DIAGNOSIS — D631 Anemia in chronic kidney disease: Secondary | ICD-10-CM | POA: Diagnosis not present

## 2023-01-10 DIAGNOSIS — E1165 Type 2 diabetes mellitus with hyperglycemia: Secondary | ICD-10-CM

## 2023-01-13 DIAGNOSIS — D689 Coagulation defect, unspecified: Secondary | ICD-10-CM | POA: Diagnosis not present

## 2023-01-13 DIAGNOSIS — E1122 Type 2 diabetes mellitus with diabetic chronic kidney disease: Secondary | ICD-10-CM | POA: Diagnosis not present

## 2023-01-13 DIAGNOSIS — D631 Anemia in chronic kidney disease: Secondary | ICD-10-CM | POA: Diagnosis not present

## 2023-01-13 DIAGNOSIS — Z992 Dependence on renal dialysis: Secondary | ICD-10-CM | POA: Diagnosis not present

## 2023-01-13 DIAGNOSIS — N186 End stage renal disease: Secondary | ICD-10-CM | POA: Diagnosis not present

## 2023-01-13 DIAGNOSIS — D509 Iron deficiency anemia, unspecified: Secondary | ICD-10-CM | POA: Diagnosis not present

## 2023-01-13 DIAGNOSIS — N2581 Secondary hyperparathyroidism of renal origin: Secondary | ICD-10-CM | POA: Diagnosis not present

## 2023-01-15 DIAGNOSIS — D689 Coagulation defect, unspecified: Secondary | ICD-10-CM | POA: Diagnosis not present

## 2023-01-15 DIAGNOSIS — N186 End stage renal disease: Secondary | ICD-10-CM | POA: Diagnosis not present

## 2023-01-15 DIAGNOSIS — D631 Anemia in chronic kidney disease: Secondary | ICD-10-CM | POA: Diagnosis not present

## 2023-01-15 DIAGNOSIS — E1122 Type 2 diabetes mellitus with diabetic chronic kidney disease: Secondary | ICD-10-CM | POA: Diagnosis not present

## 2023-01-15 DIAGNOSIS — D509 Iron deficiency anemia, unspecified: Secondary | ICD-10-CM | POA: Diagnosis not present

## 2023-01-15 DIAGNOSIS — Z992 Dependence on renal dialysis: Secondary | ICD-10-CM | POA: Diagnosis not present

## 2023-01-15 DIAGNOSIS — N2581 Secondary hyperparathyroidism of renal origin: Secondary | ICD-10-CM | POA: Diagnosis not present

## 2023-01-15 NOTE — Assessment & Plan Note (Signed)
Patient with observed foot callus in the setting of diabetic peripheral neuropathy.  Given appearance and concern, recommend further evaluation and follow-up with podiatrist for management, patient in agreement, referral placed today

## 2023-01-15 NOTE — Assessment & Plan Note (Addendum)
Patient reports that fasting blood sugars are in the low 100s.  He reports continuing with glimepiride as well as long-acting insulin, insulin glargine.  He is in need of pen needles for insulin administration.  Denies any symptoms of hyper or hypoglycemia.  Has not had recent hemoglobin A1c check.  Recent hemoglobin A1c has been better controlled, has had notable elevation in the past.  We previously provided a referral to an endocrinologist and he does have upcoming appointment scheduled.  Recommend continuing with scheduled appointment to establish with endocrinologist locally. As above, given underlying neuropathy and associated foot callus, referral to podiatry placed today

## 2023-01-15 NOTE — Assessment & Plan Note (Addendum)
We previously have attempted to obtain initial x-ray imaging, however patient has not had this completed as of yet.  He continues to have issues with right knee in May again reviewed options today.  We will proceed with referral to orthopedic surgeon for further evaluation given history of injury and ongoing issues with right knee including instability, pain, concern for remote patellar injury

## 2023-01-16 ENCOUNTER — Other Ambulatory Visit: Payer: Medicare Other

## 2023-01-17 DIAGNOSIS — N2581 Secondary hyperparathyroidism of renal origin: Secondary | ICD-10-CM | POA: Diagnosis not present

## 2023-01-17 DIAGNOSIS — D631 Anemia in chronic kidney disease: Secondary | ICD-10-CM | POA: Diagnosis not present

## 2023-01-17 DIAGNOSIS — D689 Coagulation defect, unspecified: Secondary | ICD-10-CM | POA: Diagnosis not present

## 2023-01-17 DIAGNOSIS — Z992 Dependence on renal dialysis: Secondary | ICD-10-CM | POA: Diagnosis not present

## 2023-01-17 DIAGNOSIS — D509 Iron deficiency anemia, unspecified: Secondary | ICD-10-CM | POA: Diagnosis not present

## 2023-01-17 DIAGNOSIS — E1122 Type 2 diabetes mellitus with diabetic chronic kidney disease: Secondary | ICD-10-CM | POA: Diagnosis not present

## 2023-01-17 DIAGNOSIS — N186 End stage renal disease: Secondary | ICD-10-CM | POA: Diagnosis not present

## 2023-01-20 DIAGNOSIS — N186 End stage renal disease: Secondary | ICD-10-CM | POA: Diagnosis not present

## 2023-01-20 DIAGNOSIS — E1122 Type 2 diabetes mellitus with diabetic chronic kidney disease: Secondary | ICD-10-CM | POA: Diagnosis not present

## 2023-01-20 DIAGNOSIS — D509 Iron deficiency anemia, unspecified: Secondary | ICD-10-CM | POA: Diagnosis not present

## 2023-01-20 DIAGNOSIS — Z992 Dependence on renal dialysis: Secondary | ICD-10-CM | POA: Diagnosis not present

## 2023-01-20 DIAGNOSIS — N2581 Secondary hyperparathyroidism of renal origin: Secondary | ICD-10-CM | POA: Diagnosis not present

## 2023-01-20 DIAGNOSIS — D689 Coagulation defect, unspecified: Secondary | ICD-10-CM | POA: Diagnosis not present

## 2023-01-20 DIAGNOSIS — D631 Anemia in chronic kidney disease: Secondary | ICD-10-CM | POA: Diagnosis not present

## 2023-01-20 NOTE — Progress Notes (Signed)
KANDEN, WILMOTT ([id number]) [id number] Page 1 of 6 Visit Report for 01/21/2023 Allergy List Details Patient Name: Date of Service: Timothy [name] 01/21/2023 9:30 A M Medical Record Number: [id number] Patient Account Number: [id number] Date of Birth/Sex: Treating RN: 1970-10-15 (52 y.o. Charlean Merl, Lauren Primary Care Rexine Gowens: Sanjuan Dame Other Clinician: Referring Aviyon Hocevar: Treating Chipper Koudelka/Extender: Earna Coder in Treatment: 0 Allergies Active Allergies No Known Allergies Allergy Notes Electronic Signature(s) Signed: 01/20/2023 11:10:43 AM By: Fonnie Mu RN Entered By: Fonnie Mu on 01/20/2023 11:10:42 -------------------------------------------------------------------------------- Arrival Information Details Patient Name: Date of Service: Timothy Killings E. 01/21/2023 9:30 A M Medical Record Number: [id number] Patient Account Number: [id number] Date of Birth/Sex: Treating RN: [birth year] (52 y.o. Tammy Sours Primary Care Josiel Gahm: Sanjuan Dame Other Clinician: Referring Elisia Stepp: Treating Latise Dilley/Extender: Earna Coder in Treatment: 0 Visit Information Patient Arrived: Ambulatory Arrival Time: 09:31 Accompanied By: self Transfer Assistance: None Patient Identification Verified: Yes Secondary Verification Process Completed: Yes Patient Requires Transmission-Based Precautions: No Patient Has Alerts: No Electronic Signature(s) Signed: 01/22/2023 5:35:56 PM By: Shawn Stall RN, BSN Entered By: Shawn Stall on 01/21/2023 09:34:57 -------------------------------------------------------------------------------- Clinic Level of Care Assessment Details Patient Name: Date of Service: Timothy Killings E. 01/21/2023 9:30 A M Medical Record Number: [id number] Patient Account Number: [id number] Timothy [name], Timothy [name] ([id number])  [id number] Page 2 of 6 Date of Birth/Sex: Treating RN: Aug 16, 1970 (52 y.o. Tammy Sours Primary Care Daylen Lipsky: Other Clinician: Sanjuan Dame Referring Shloime Keilman: Treating Zunaira Lamy/Extender: Earna Coder in Treatment: 0 Clinic Level of Care Assessment Items TOOL 2 Quantity Score X- 1 0 Use when only an EandM is performed on the INITIAL visit ASSESSMENTS - Nursing Assessment / Reassessment X- 1 20 General Physical Exam (combine w/ comprehensive assessment (listed just below) when performed on new pt. evals) X- 1 25 Comprehensive Assessment (HX, ROS, Risk Assessments, Wounds Hx, etc.) ASSESSMENTS - Wound and Skin A ssessment / Reassessment []  - 0 Simple Wound Assessment / Reassessment - one wound []  - 0 Complex Wound Assessment / Reassessment - multiple wounds X- 1 10 Dermatologic / Skin Assessment (not related to wound area) ASSESSMENTS - Ostomy and/or Continence Assessment and Care []  - 0 Incontinence Assessment and Management []  - 0 Ostomy Care Assessment and Management (repouching, etc.) PROCESS - Coordination of Care X - Simple Patient / Family Education for ongoing care 1 15 []  - 0 Complex (extensive) Patient / Family Education for ongoing care X- 1 10 Staff obtains Chiropractor, Records, T Results / Process Orders est []  - 0 Staff telephones HHA, Nursing Homes / Clarify orders / etc []  - 0 Routine Transfer to another Facility (non-emergent condition) []  - 0 Routine Hospital Admission (non-emergent condition) []  - 0 New Admissions / Manufacturing engineer / Ordering NPWT Apligraf, etc. , []  - 0 Emergency Hospital Admission (emergent condition) X- 1 10 Simple Discharge Coordination []  - 0 Complex (extensive) Discharge Coordination PROCESS - Special Needs []  - 0 Pediatric / Minor Patient Management []  - 0 Isolation Patient Management []  - 0 Hearing / Language / Visual special needs []  -  0 Assessment of Community assistance (transportation, D/C planning, etc.) []  - 0 Additional assistance / Altered mentation []  - 0 Support Surface(s) Assessment (bed, cushion, seat, etc.) INTERVENTIONS - Wound Cleansing / Measurement []  - 0 Wound Imaging (photographs - any number of wounds) []  - 0 Wound Tracing (instead of photographs) []  - 0  Simple Wound Measurement - one wound []  - 0 Complex Wound Measurement - multiple wounds []  - 0 Simple Wound Cleansing - one wound []  - 0 Complex Wound Cleansing - multiple wounds INTERVENTIONS - Wound Dressings []  - 0 Small Wound Dressing one or multiple wounds []  - 0 Medium Wound Dressing one or multiple wounds []  - 0 Large Wound Dressing one or multiple wounds []  - 0 Application of Medications - injection Timothy [name], Timothy [name] ([id number]) [id number] Page 3 of 6 INTERVENTIONS - Miscellaneous []  - 0 External ear exam []  - 0 Specimen Collection (cultures, biopsies, blood, body fluids, etc.) []  - 0 Specimen(s) / Culture(s) sent or taken to Lab for analysis []  - 0 Patient Transfer (multiple staff / Michiel Sites Lift / Similar devices) []  - 0 Simple Staple / Suture removal (25 or less) []  - 0 Complex Staple / Suture removal (26 or more) []  - 0 Hypo / Hyperglycemic Management (close monitor of Blood Glucose) []  - 0 Ankle / Brachial Index (ABI) - do not check if billed separately Has the patient been seen at the hospital within the last three years: Yes Total Score: 90 Level Of Care: New/Established - Level 3 Electronic Signature(s) Signed: 01/22/2023 5:35:56 PM By: Shawn Stall RN, BSN Entered By: Shawn Stall on 01/21/2023 09:44:50 -------------------------------------------------------------------------------- Encounter Discharge Information Details Patient Name: Date of Service: Timothy Killings E. 01/21/2023 9:30 A M Medical Record Number: [id number] Patient Account Number: [id number] Date of Birth/Sex:  Treating RN: 18-Jan-1971 (52 y.o. Tammy Sours Primary Care Hessie Varone: Sanjuan Dame Other Clinician: Referring Jakyrie Totherow: Treating Zayne Marovich/Extender: Earna Coder in Treatment: 0 Encounter Discharge Information Items Discharge Condition: Stable Ambulatory Status: Ambulatory Discharge Destination: Home Transportation: Private Auto Accompanied By: self Schedule Follow-up Appointment: No Clinical Summary of Care: Electronic Signature(s) Signed: 01/22/2023 5:35:56 PM By: Shawn Stall RN, BSN Entered By: Shawn Stall on 01/21/2023 09:45:31 -------------------------------------------------------------------------------- Lower Extremity Assessment Details Patient Name: Date of Service: Timothy Killings E. 01/21/2023 9:30 A M Medical Record Number: [id number] Patient Account Number: [id number] Date of Birth/Sex: Treating RN: 12-31-1970 (52 y.o. Tammy Sours Primary Care Tnya Ades: Sanjuan Dame Other Clinician: Referring Brody Kump: Treating Jull Harral/Extender: Earna Coder in Treatment: 0 Electronic Signature(s) Signed: 01/22/2023 5:35:56 PM By: Shawn Stall RN, BSN Entered By: Shawn Stall on 01/21/2023 09:39:41 Timothy [name] ([id number]) [id number] Page 4 of 6 -------------------------------------------------------------------------------- Multi Wound Chart Details Patient Name: Date of Service: Timothy [name] 01/21/2023 9:30 A M Medical Record Number: [id number] Patient Account Number: [id number] Date of Birth/Sex: Treating RN: 1970/05/17 (52 y.o. M) Primary Care Renardo Cheatum: Sanjuan Dame Other Clinician: Referring Yuriana Gaal: Treating Pascal Stiggers/Extender: Earna Coder in Treatment: 0 Vital Signs Height(in): 66 Capillary Blood Glucose(mg/dl): 244 Weight(lbs): 010 Pulse(bpm): 70 Body Mass Index(BMI): 28.4 Blood Pressure(mmHg):  148/83 Temperature(F): 99 Respiratory Rate(breaths/min): 20 [Treatment Notes:Wound Assessments Treatment Notes] Electronic Signature(s) Signed: 01/21/2023 4:32:13 PM By: Baltazar Najjar MD Entered By: Baltazar Najjar on 01/21/2023 10:19:32 -------------------------------------------------------------------------------- Multi-Disciplinary Care Plan Details Patient Name: Date of Service: Timothy Killings E. 01/21/2023 9:30 A M Medical Record Number: [id number] Patient Account Number: [id number] Date of Birth/Sex: Treating RN: Dec 06, 1970 (52 y.o. Tammy Sours Primary Care Tenishia Ekman: Sanjuan Dame Other Clinician: Referring Beauden Tremont: Treating Gjon Letarte/Extender: Earna Coder in Treatment: 0 Active Inactive Electronic Signature(s) Signed: 01/22/2023 5:35:56 PM By: Shawn Stall RN, BSN Entered By: Shawn Stall on 01/21/2023 09:42:58 --------------------------------------------------------------------------------  Pain Assessment Details Patient Name: Date of Service: Timothy [name] 01/21/2023 9:30 A M Medical Record Number: [id number] Patient Account Number: [id number] Date of Birth/Sex: Treating RN: 11-05-1970 (52 y.o. Tammy Sours Primary Care Charrie Mcconnon: Sanjuan Dame Other Clinician: Referring Brayleigh Rybacki: Treating Malaiya Paczkowski/Extender: Earna Coder in Treatment: 0 Timothy [name], Timothy [name] ([id number]) [id number] Page 5 of 6 Active Problems Location of Pain Severity and Description of Pain Patient Has Paino No Site Locations Pain Management and Medication Current Pain Management: Electronic Signature(s) Signed: 01/22/2023 5:35:56 PM By: Shawn Stall RN, BSN Entered By: Shawn Stall on 01/21/2023 09:36:05 -------------------------------------------------------------------------------- Patient/Caregiver Education Details Patient Name: Date of Service: Timothy [name]  12/17/2024andnbsp9:30 A M Medical Record Number: [id number] Patient Account Number: [id number] Date of Birth/Gender: Treating RN: [birth year] (52 y.o. Tammy Sours Primary Care Physician: Sanjuan Dame Other Clinician: Referring Physician: Treating Physician/Extender: Earna Coder in Treatment: 0 Education Assessment Education Provided To: Patient Education Topics Provided Welcome [facility]New Patient Packet: o Handouts: Welcome T [facility] o Methods: Explain/Verbal Responses: State content correctly Electronic Signature(s) Signed: 01/22/2023 5:35:56 PM By: Shawn Stall RN, BSN Entered By: Shawn Stall on 01/21/2023 09:43:07 Timothy [name] ([id number]) [id number] Page 6 of 6 -------------------------------------------------------------------------------- Vitals Details Patient Name: Date of Service: Timothy [name] 01/21/2023 9:30 A M Medical Record Number: [id number] Patient Account Number: [id number] Date of Birth/Sex: Treating RN: 06-20-1970 (52 y.o. Tammy Sours Primary Care Olson Lucarelli: Sanjuan Dame Other Clinician: Referring Dontrae Morini: Treating Daneya Hartgrove/Extender: Earna Coder in Treatment: 0 Vital Signs Time Taken: 09:35 Temperature (F): 99 Height (in): 66 Pulse (bpm): 70 Source: Stated Respiratory Rate (breaths/min): 20 Weight (lbs): 176 Blood Pressure (mmHg): 148/83 Source: Stated Capillary Blood Glucose (mg/dl): 841 Body Mass Index (BMI): 28.4 Reference Range: 80 - 120 mg / dl Electronic Signature(s) Signed: 01/22/2023 5:35:56 PM By: Shawn Stall RN, BSN Entered By: Shawn Stall on 01/21/2023 09:35:21

## 2023-01-21 ENCOUNTER — Encounter (HOSPITAL_BASED_OUTPATIENT_CLINIC_OR_DEPARTMENT_OTHER): Payer: 59 | Attending: Internal Medicine | Admitting: Internal Medicine

## 2023-01-21 DIAGNOSIS — E11621 Type 2 diabetes mellitus with foot ulcer: Secondary | ICD-10-CM | POA: Diagnosis present

## 2023-01-21 DIAGNOSIS — E11622 Type 2 diabetes mellitus with other skin ulcer: Secondary | ICD-10-CM | POA: Insufficient documentation

## 2023-01-21 DIAGNOSIS — L97811 Non-pressure chronic ulcer of other part of right lower leg limited to breakdown of skin: Secondary | ICD-10-CM | POA: Diagnosis not present

## 2023-01-21 DIAGNOSIS — I132 Hypertensive heart and chronic kidney disease with heart failure and with stage 5 chronic kidney disease, or end stage renal disease: Secondary | ICD-10-CM | POA: Insufficient documentation

## 2023-01-21 DIAGNOSIS — I509 Heart failure, unspecified: Secondary | ICD-10-CM | POA: Insufficient documentation

## 2023-01-21 DIAGNOSIS — E785 Hyperlipidemia, unspecified: Secondary | ICD-10-CM | POA: Diagnosis not present

## 2023-01-21 DIAGNOSIS — N184 Chronic kidney disease, stage 4 (severe): Secondary | ICD-10-CM | POA: Insufficient documentation

## 2023-01-21 DIAGNOSIS — Z992 Dependence on renal dialysis: Secondary | ICD-10-CM | POA: Diagnosis not present

## 2023-01-21 DIAGNOSIS — E1122 Type 2 diabetes mellitus with diabetic chronic kidney disease: Secondary | ICD-10-CM | POA: Insufficient documentation

## 2023-01-21 DIAGNOSIS — N185 Chronic kidney disease, stage 5: Secondary | ICD-10-CM | POA: Diagnosis not present

## 2023-01-22 ENCOUNTER — Ambulatory Visit (HOSPITAL_BASED_OUTPATIENT_CLINIC_OR_DEPARTMENT_OTHER): Payer: Self-pay | Admitting: Orthopaedic Surgery

## 2023-01-22 ENCOUNTER — Other Ambulatory Visit (HOSPITAL_BASED_OUTPATIENT_CLINIC_OR_DEPARTMENT_OTHER): Payer: Self-pay

## 2023-01-22 ENCOUNTER — Ambulatory Visit (HOSPITAL_BASED_OUTPATIENT_CLINIC_OR_DEPARTMENT_OTHER): Payer: 59 | Admitting: Orthopaedic Surgery

## 2023-01-22 DIAGNOSIS — E1122 Type 2 diabetes mellitus with diabetic chronic kidney disease: Secondary | ICD-10-CM | POA: Diagnosis not present

## 2023-01-22 DIAGNOSIS — S86811A Strain of other muscle(s) and tendon(s) at lower leg level, right leg, initial encounter: Secondary | ICD-10-CM

## 2023-01-22 DIAGNOSIS — D631 Anemia in chronic kidney disease: Secondary | ICD-10-CM | POA: Diagnosis not present

## 2023-01-22 DIAGNOSIS — Z992 Dependence on renal dialysis: Secondary | ICD-10-CM | POA: Diagnosis not present

## 2023-01-22 DIAGNOSIS — M1711 Unilateral primary osteoarthritis, right knee: Secondary | ICD-10-CM | POA: Diagnosis not present

## 2023-01-22 DIAGNOSIS — D509 Iron deficiency anemia, unspecified: Secondary | ICD-10-CM | POA: Diagnosis not present

## 2023-01-22 DIAGNOSIS — N2581 Secondary hyperparathyroidism of renal origin: Secondary | ICD-10-CM | POA: Diagnosis not present

## 2023-01-22 DIAGNOSIS — D689 Coagulation defect, unspecified: Secondary | ICD-10-CM | POA: Diagnosis not present

## 2023-01-22 DIAGNOSIS — N186 End stage renal disease: Secondary | ICD-10-CM | POA: Diagnosis not present

## 2023-01-22 MED ORDER — ACETAMINOPHEN 500 MG PO TABS
500.0000 mg | ORAL_TABLET | Freq: Three times a day (TID) | ORAL | 0 refills | Status: AC
  Filled 2023-01-22: qty 30, 10d supply, fill #0

## 2023-01-22 MED ORDER — ASPIRIN 325 MG PO TBEC
325.0000 mg | DELAYED_RELEASE_TABLET | Freq: Every day | ORAL | 0 refills | Status: DC
  Filled 2023-01-22: qty 14, 14d supply, fill #0

## 2023-01-22 MED ORDER — IBUPROFEN 800 MG PO TABS
800.0000 mg | ORAL_TABLET | Freq: Three times a day (TID) | ORAL | 0 refills | Status: AC
  Filled 2023-01-22: qty 30, 10d supply, fill #0

## 2023-01-22 MED ORDER — OXYCODONE HCL 5 MG PO TABS
5.0000 mg | ORAL_TABLET | ORAL | 0 refills | Status: DC | PRN
  Filled 2023-01-22: qty 10, 2d supply, fill #0

## 2023-01-22 NOTE — Progress Notes (Signed)
Chief Complaint: Right knee pain     History of Present Illness:   01/22/2023: Presents today for follow-up and MRI discussion of the right knee.  He is still having weakness with extension.  Timothy [name] is a 52 y.o. male presents with right knee pain in the setting of a previous injury.  He states that many years prior when he was playing basketball he did have an injury and a pop.  Subsequently he was told he had a meniscus injury.  He was told that this required surgery but unfortunately was not able to have this due to insurance issues.  He has subsequently moved to [place].  He coaches football.  He does have a history of kidney disease from diabetes although his most recent A1c was 6.  He is on dialysis.  He is having a hard time with any type of walking as the knee continues to buckle and is very painful.    PMH/PSH/Family History/Social History/Meds/Allergies:    Past Medical History:  Diagnosis Date   Allergies    Anemia    Blind left eye    CHF (congestive heart failure) (HCC)    Chronic kidney disease    stage 4   Diabetes mellitus without complication (HCC)    Dyspnea    Patient denies SOB since he started dialysis. States he went away after he started dialysis   HLD (hyperlipidemia)    Hypertension    Pneumonia    Past Surgical History:  Procedure Laterality Date   A/V FISTULAGRAM Left 11/19/2022   Procedure: A/V Fistulagram;  Surgeon: Nada Libman, MD;  Location: [facility];  Service: Cardiovascular;  Laterality: Left;   AV FISTULA PLACEMENT Left 09/19/2022   Procedure: ARTERIOVENOUS (AV) FISTULA CREATION;  Surgeon: Nada Libman, MD;  Location: [facility] OR;  Service: Vascular;  Laterality: Left;   BIOPSY  12/05/2022   Procedure: BIOPSY;  Surgeon: Napoleon Form, MD;  Location: [facility];  Service: Gastroenterology;;   CATARACT EXTRACTION Right    COLONOSCOPY WITH PROPOFOL N/A 12/05/2022   Procedure: COLONOSCOPY WITH PROPOFOL;   Surgeon: Napoleon Form, MD;  Location: [facility];  Service: Gastroenterology;  Laterality: N/A;   ESOPHAGOGASTRODUODENOSCOPY (EGD) WITH PROPOFOL N/A 12/05/2022   Procedure: ESOPHAGOGASTRODUODENOSCOPY (EGD) WITH PROPOFOL;  Surgeon: Napoleon Form, MD;  Location: [facility];  Service: Gastroenterology;  Laterality: N/A;   MENISCUS REPAIR Left    PERIPHERAL VASCULAR INTERVENTION  11/19/2022   Procedure: PERIPHERAL VASCULAR INTERVENTION;  Surgeon: Nada Libman, MD;  Location: [facility];  Service: Cardiovascular;;  Embolization coil   POLYPECTOMY  12/05/2022   Procedure: POLYPECTOMY;  Surgeon: Napoleon Form, MD;  Location: [facility];  Service: Gastroenterology;;   Social History   Socioeconomic History   Marital status: Single    Spouse name: Not on file   Number of children: 1   Years of education: Not on file   Highest education level: Not on file  Occupational History   Occupation: disabled  Tobacco Use   Smoking status: Never    Passive exposure: Never   Smokeless tobacco: Never  Vaping Use   Vaping status: Never Used  Substance and Sexual Activity   Alcohol use: Not Currently   Drug use: Not Currently   Sexual activity: Not on file  Other Topics Concern   Not on file  Social History Narrative   Not on file   Social Drivers of Health   Financial Resource  Strain: Low Risk  (12/19/2022)   Overall Financial Resource Strain (CARDIA)    Difficulty of Paying Living Expenses: Not hard at all  Food Insecurity: No Food Insecurity (12/19/2022)   Hunger Vital Sign    Worried About Running Out of Food in the Last Year: Never true    Ran Out of Food in the Last Year: Never true  Transportation Needs: No Transportation Needs (12/19/2022)   PRAPARE - Administrator, Civil Service (Medical): No    Lack of Transportation (Non-Medical): No  Physical Activity: Insufficiently Active (12/19/2022)   Exercise Vital Sign    Days of Exercise per  Week: 2 days    Minutes of Exercise per Session: 60 min  Stress: No Stress Concern Present (12/19/2022)   [employer] of Occupational Health - Occupational Stress Questionnaire    Feeling of Stress : Not at all  Social Connections: Moderately Integrated (12/19/2022)   Social Connection and Isolation Panel [NHANES]    Frequency of Communication with Friends and Family: More than three times a week    Frequency of Social Gatherings with Friends and Family: Once a week    Attends Religious Services: More than 4 times per year    Active Member of [employer] or Organizations: No    Attends Engineer, structural: Never    Marital Status: Living with partner   Family History  Problem Relation Age of Onset   Hypertension Mother    Diabetes Mother    Heart attack Mother    Diabetes Brother    No Known Allergies Current Outpatient Medications  Medication Sig Dispense Refill   acetaminophen (TYLENOL) 500 MG tablet Take 1 tablet (500 mg total) by mouth every 8 (eight) hours for 10 days. 30 tablet 0   aspirin EC 325 MG tablet Take 1 tablet (325 mg total) by mouth daily. 14 tablet 0   ibuprofen (ADVIL) 800 MG tablet Take 1 tablet (800 mg total) by mouth every 8 (eight) hours for 10 days. Please take with food, please alternate with acetaminophen 30 tablet 0   oxyCODONE (ROXICODONE) 5 MG immediate release tablet Take 1 tablet (5 mg total) by mouth every 4 (four) hours as needed for severe pain (pain score 7-10) or breakthrough pain. 10 tablet 0   amLODipine (NORVASC) 10 MG tablet Take 10 mg by mouth daily.     Continuous Glucose Sensor (FREESTYLE LIBRE 3 SENSOR) MISC 1 each by Does not apply route See admin instructions. Replace every 14 days 6 each 1   FARXIGA 10 MG TABS tablet Take 10 mg by mouth daily.     furosemide (LASIX) 80 MG tablet Take 80 mg by mouth every Tuesday, Thursday, Saturday, and Sunday.     gentamicin cream (GARAMYCIN) 0.1 % Apply 1 Application topically daily. Apply to  right leg once daily. (Patient not taking: Reported on 12/30/2022) 30 g 0   hydrALAZINE (APRESOLINE) 100 MG tablet Take 50 mg by mouth 3 (three) times daily. MAY TAKE EXTRA 50MG  FOR SBP>160     hydrOXYzine (VISTARIL) 25 MG capsule Take 25 mg by mouth at bedtime.     insulin glargine (LANTUS SOLOSTAR) 100 UNIT/ML Solostar Pen INJECT 5 UINTS INTO THE SKIN DAILY (Patient taking differently: Inject 8 Units into the skin at bedtime.) 15 mL 1   Insulin Pen Needle (PEN NEEDLES) 31G X 5 MM MISC 1 each by Does not apply route daily. 100 each 1   isosorbide mononitrate (IMDUR) 30 MG 24 hr tablet  Take 30 mg by mouth daily.     labetalol (NORMODYNE) 200 MG tablet Take 400 mg by mouth 2 (two) times daily.     levocetirizine (XYZAL) 5 MG tablet Take 5 mg by mouth daily.     losartan (COZAAR) 25 MG tablet Take 25 mg by mouth daily.     metolazone (ZAROXOLYN) 2.5 MG tablet Take 2.5 mg by mouth daily.     metoprolol succinate (TOPROL-XL) 100 MG 24 hr tablet Take 1 tablet (100 mg total) by mouth daily. (Patient not taking: Reported on 12/30/2022) 30 tablet 3   pantoprazole (PROTONIX) 40 MG tablet Take 1 tablet (40 mg total) by mouth 2 (two) times daily before a meal. 180 tablet 3   sacubitril-valsartan (ENTRESTO) 97-103 MG Take 1 tablet by mouth 2 (two) times daily.     spironolactone (ALDACTONE) 25 MG tablet Take 25 mg by mouth daily.     torsemide (DEMADEX) 20 MG tablet Take 20 mg by mouth 2 (two) times daily.     TRESIBA FLEXTOUCH 100 UNIT/ML FlexTouch Pen Inject 2.5 Units into the skin at bedtime.     No current facility-administered medications for this visit.   No results found.  Review of Systems:   A ROS was performed including pertinent positives and negatives as documented in the HPI.  Physical Exam :   Constitutional: NAD and appears stated age Neurological: Alert and oriented Psych: Appropriate affect and cooperative There were no vitals taken for this visit.   Comprehensive Musculoskeletal  Exam:    Right knee with a proximately retracted patella.  He is not able to actively extend against gravity.  Range of motion of the right knee is from approximately 20 degrees to 40 degrees.  There is tenderness about the remaining trochlear groove.   Imaging:   Xray (4 views right knee): Evidence of an old patella tendon avulsion with significant retraction and osteoarthritis of the patella  MRI right knee: There is full-thickness chronic patella tendon rupture without significant patellofemoral cartilage loss  I personally reviewed and interpreted the radiographs.   Assessment and Plan:   52 y.o. 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  Unfortunately I did discuss that this is quite a difficult issue.  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  At this time he is having continued weakness with buckling of the right knee in the setting of extensor mechanism deficiency.  MRI does not show significant osteoarthritis and given that I do believe he would benefit from a VY lengthening of the quadriceps with the patella tendon reconstruction with allograft Achilles.  I did discuss the risks and benefits as well as the rehab protocol.  After discussion he is ultimately elected for this.  Plan for right knee quadriceps tendon lengthening and patella tendon reconstruction with Achilles allograft   After a lengthy discussion of treatment options, including risks, benefits, alternatives, complications of surgical and nonsurgical conservative options, the patient elected surgical repair.   The patient  is aware of the material risks  and complications including, but not limited to injury to adjacent structures, neurovascular injury, infection, numbness, bleeding, implant failure, thermal burns, stiffness, persistent pain, failure to heal, disease transmission from allograft, need for further  surgery, dislocation, anesthetic risks, blood clots, risks of death,and others. The probabilities of surgical success and failure discussed with patient given their particular co-morbidities.The time and nature of expected rehabilitation and recovery was discussed.The  patient's questions were all answered preoperatively.  No barriers to understanding were noted. I explained the natural history of the disease process and Rx rationale.  I explained to the patient what I considered to be reasonable expectations given their personal situation.  The final treatment plan was arrived at through a shared patient decision making process model.    I personally saw and evaluated the patient, and participated in the management and treatment plan.  Huel Cote, MD Attending Physician, Orthopedic Surgery  This document was dictated using Dragon voice recognition software. A reasonable attempt at proof reading has been made to minimize errors.

## 2023-01-22 NOTE — Addendum Note (Signed)
Addended by: Jeanella Cara on: 01/22/2023 05:03 PM   Modules accepted: Orders

## 2023-01-23 ENCOUNTER — Encounter: Payer: Self-pay | Admitting: "Endocrinology

## 2023-01-23 ENCOUNTER — Ambulatory Visit (INDEPENDENT_AMBULATORY_CARE_PROVIDER_SITE_OTHER): Payer: 59 | Admitting: "Endocrinology

## 2023-01-23 VITALS — BP 138/82 | HR 84 | Resp 95 | Ht 66.0 in | Wt 174.2 lb

## 2023-01-23 DIAGNOSIS — Z794 Long term (current) use of insulin: Secondary | ICD-10-CM

## 2023-01-23 DIAGNOSIS — E7800 Pure hypercholesterolemia, unspecified: Secondary | ICD-10-CM | POA: Diagnosis not present

## 2023-01-23 DIAGNOSIS — E1140 Type 2 diabetes mellitus with diabetic neuropathy, unspecified: Secondary | ICD-10-CM | POA: Diagnosis not present

## 2023-01-23 DIAGNOSIS — Z7984 Long term (current) use of oral hypoglycemic drugs: Secondary | ICD-10-CM | POA: Diagnosis not present

## 2023-01-23 MED ORDER — DEXCOM G7 SENSOR MISC
1.0000 | 1 refills | Status: AC
Start: 2023-01-23 — End: ?

## 2023-01-23 NOTE — Progress Notes (Signed)
GLENDALE, OPENSHAW ([id number]) [phone] [id number] Page 1 of 4 Visit Report for 01/21/2023 Abuse Risk Screen Details Patient Name: Date of Service: Timothy [name] 01/21/2023 9:30 A M Medical Record Number: [id number] Patient Account Number: [id number] Date of Birth/Sex: Treating RN: 12/30/1970 (52 y.o. Tammy Sours Primary Care Davianna Deutschman: Sanjuan Dame Other Clinician: Referring Logyn Dedominicis: Treating Diana Armijo/Extender: Earna Coder in Treatment: 0 Abuse Risk Screen Items Answer ABUSE RISK SCREEN: Has anyone close to you tried to hurt or harm you recentlyo No Do you feel uncomfortable with anyone in your familyo No Has anyone forced you do things that you didnt want to doo No Electronic Signature(s) Signed: 01/22/2023 5:35:56 PM By: Shawn Stall RN, BSN Entered By: Shawn Stall on 01/21/2023 09:35:28 -------------------------------------------------------------------------------- Activities of Daily Living Details Patient Name: Date of Service: Timothy Killings E. 01/21/2023 9:30 A M Medical Record Number: [id number] Patient Account Number: [id number] Date of Birth/Sex: Treating RN: [birth year] (52 y.o. Tammy Sours Primary Care Taniah Reinecke: Sanjuan Dame Other Clinician: Referring Bronda Alfred: Treating Shloma Roggenkamp/Extender: Earna Coder in Treatment: 0 Activities of Daily Living Items Answer Activities of Daily Living (Please select one for each item) Drive Automobile Completely Able T Medications ake Completely Able Use T elephone Completely Able Care for Appearance Completely Able Use T oilet Completely Able Bath / Shower Completely Able Dress Self Completely Able Feed Self Completely Able Walk Completely Able Get In / Out Bed Completely Able Housework Completely Able Prepare Meals Completely Able Handle Money Completely Able Shop for Self Completely Able Electronic  Signature(s) Signed: 01/22/2023 5:35:56 PM By: Shawn Stall RN, BSN Entered By: Shawn Stall on 01/21/2023 09:35:42 Timothy [name] ([id number]) [id number] Page 2 of 4 -------------------------------------------------------------------------------- Education Screening Details Patient Name: Date of Service: Timothy [name] 01/21/2023 9:30 A M Medical Record Number: [id number] Patient Account Number: [id number] Date of Birth/Sex: Treating RN: 12-Aug-1970 (52 y.o. Tammy Sours Primary Care Kemper Hochman: Sanjuan Dame Other Clinician: Referring Rafeal Skibicki: Treating Arnold Kester/Extender: Earna Coder in Treatment: 0 Primary Learner Assessed: Patient Learning Preferences/Education Level/Primary Language Learning Preference: Explanation, Demonstration, Printed Material Highest Education Level: College or Above Preferred Language: English Cognitive Barrier Language Barrier: No Translator Needed: No Memory Deficit: No Emotional Barrier: No Cultural/Religious Beliefs Affecting Medical Care: No Physical Barrier Impaired Vision: No Impaired Hearing: No Decreased Hand dexterity: No Knowledge/Comprehension Knowledge Level: High Comprehension Level: High Ability to understand written instructions: High Ability to understand verbal instructions: High Motivation Anxiety Level: Calm Cooperation: Cooperative Education Importance: Acknowledges Need Interest in Health Problems: Asks Questions Perception: Coherent Willingness to Engage in Self-Management High Activities: Readiness to Engage in Self-Management High Activities: Electronic Signature(s) Signed: 01/22/2023 5:35:56 PM By: Shawn Stall RN, BSN Entered By: Shawn Stall on 01/21/2023 09:35:58 -------------------------------------------------------------------------------- Fall Risk Assessment Details Patient Name: Date of Service: Timothy Killings E.  01/21/2023 9:30 A M Medical Record Number: [id number] Patient Account Number: [id number] Date of Birth/Sex: Treating RN: May 30, 1970 (52 y.o. Tammy Sours Primary Care Bailey Faiella: Sanjuan Dame Other Clinician: Referring Cherokee Boccio: Treating Cono Gebhard/Extender: Earna Coder in Treatment: 0 Fall Risk Assessment Items Have you had 2 or more falls in the last 12 monthso 0 No Timothy [name], Timothy [name] ([id number]) [id number] Page 3 of 4 Have you had any fall that resulted in injury in the last 12 monthso 0 No FALLS RISK SCREEN History of falling - immediate  or within 3 months 0 No Secondary diagnosis (Do you have 2 or more medical diagnoseso) 0 No Ambulatory aid None/bed rest/wheelchair/nurse 0 Yes Crutches/cane/walker 0 No Furniture 0 No Intravenous therapy Access/Saline/Heparin Lock 0 No Gait/Transferring Normal/ bed rest/ wheelchair 0 Yes Weak (short steps with or without shuffle, stooped but able to lift head while walking, may seek 0 No support from furniture) Impaired (short steps with shuffle, may have difficulty arising from chair, head down, impaired 0 No balance) Mental Status Oriented to own ability 0 Yes Electronic Signature(s) Signed: 01/22/2023 5:35:56 PM By: Shawn Stall RN, BSN Entered By: Shawn Stall on 01/21/2023 09:36:14 -------------------------------------------------------------------------------- Foot Assessment Details Patient Name: Date of Service: Timothy Killings E. 01/21/2023 9:30 A M Medical Record Number: [id number] Patient Account Number: [id number] Date of Birth/Sex: Treating RN: 03-14-1970 (52 y.o. Tammy Sours Primary Care Adeja Sarratt: Sanjuan Dame Other Clinician: Referring Gudelia Eugene: Treating Walter Grima/Extender: Earna Coder in Treatment: 0 Foot Assessment Items Site Locations + = Sensation present, - = Sensation absent, C = Callus, U = Ulcer R =  Redness, W = Warmth, M = Maceration, PU = Pre-ulcerative lesion F = Fissure, S = Swelling, D = Dryness Assessment Right: Left: Other Deformity: No No Prior Foot Ulcer: No No Prior Amputation: No No Charcot Joint: No No Ambulatory Status: Ambulatory Without Help GaitCAMILA, Timothy [name] ([id number]) [id number] Page 4 of 4 Electronic Signature(s) Signed: 01/22/2023 5:35:56 PM By: Shawn Stall RN, BSN Entered By: Shawn Stall on 01/21/2023 09:39:34 -------------------------------------------------------------------------------- Nutrition Risk Screening Details Patient Name: Date of Service: Timothy Killings E. 01/21/2023 9:30 A M Medical Record Number: [id number] Patient Account Number: [id number] Date of Birth/Sex: Treating RN: March 12, 1970 (52 y.o. Timothy [name], Timothy [name] Primary Care Geddy Boydstun: Sanjuan Dame Other Clinician: Referring Errol Ala: Treating Leiann Sporer/Extender: Earna Coder in Treatment: 0 Height (in): 66 Weight (lbs): 176 Body Mass Index (BMI): 28.4 Nutrition Risk Screening Items Score Screening NUTRITION RISK SCREEN: I have an illness or condition that made me change the kind and/or amount of food I eat 2 Yes I eat fewer than two meals per day 0 No I eat few fruits and vegetables, or milk products 0 No I have three or more drinks of beer, liquor or wine almost every day 0 No I have tooth or mouth problems that make it hard for me to eat 0 No I don't always have enough money to buy the food I need 0 No I eat alone most of the time 0 No I take three or more different prescribed or over-the-counter drugs a day 1 Yes Without wanting to, I have lost or gained 10 pounds in the last six months 0 No I am not always physically able to shop, cook and/or feed myself 0 No Nutrition Protocols Good Risk Protocol Provide education on elevated blood Moderate Risk Protocol 0 sugars and impact on wound healing, as  applicable High Risk Proctocol Risk Level: Moderate Risk Score: 3 Electronic Signature(s) Signed: 01/22/2023 5:35:56 PM By: Shawn Stall RN, BSN Entered By: Shawn Stall on 01/21/2023 09:36:23

## 2023-01-23 NOTE — Progress Notes (Signed)
HAMZA, SALDANHA ([id number]) [id number] Page 1 of 7 Visit Report for 01/21/2023 Chief Complaint Document Details Patient Name: Date of Service: Timothy [name] 01/21/2023 9:30 A M Medical Record Number: [id number] Patient Account Number: [id number] Date of Birth/Sex: Treating RN: 06/20/1970 (52 y.o. M) Primary Care Provider: Sanjuan Dame Other Clinician: Referring Provider: Treating Provider/Extender: Earna Coder in Treatment: 0 Information Obtained from: Patient Chief Complaint 01/21/2023; patient comes in the clinic today for review of a wound area on the right anterior lower leg which has healed since he initially made this appointment Electronic Signature(s) Signed: 01/21/2023 4:32:13 PM By: Baltazar Najjar MD Entered By: Baltazar Najjar on 01/21/2023 10:20:38 -------------------------------------------------------------------------------- HPI Details Patient Name: Date of Service: Timothy Killings E. 01/21/2023 9:30 A M Medical Record Number: [id number] Patient Account Number: [id number] Date of Birth/Sex: Treating RN: Sep 11, 1970 (52 y.o. 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 He tells me that several weeks ago he developed a wound on the right anterior lower leg. There is a picture of this from 12/10/2022 at which time he was seeing podiatry for annual diabetic foot exams. This looks like a superficial wound area. He tells me since he had that picture taken this is actually epithelialized over. He complains about exceptionally pruritic areas on his skin that he scratches vigorously and that is how this particular area started. He says the pruritus  is worse in the shower. The area really looks like a superficial skin abrasion. He says this was from scratching. He tells me that he has had other similar areas here in fact looking at the skin on both legs there are darkened areas which she says are areas that develop intense pruritus. He has been told this is from his chronic kidney disease. Past medical history includes congestive heart failure, chronic kidney disease stage V, type 2 diabetes, hyperlipidemia, hypertension, right knee pain for which she follows with orthopedics We did not do ABIs in the clinic as there is no open wound Electronic Signature(s) Signed: 01/21/2023 4:32:13 PM By: Baltazar Najjar MD Entered By: Baltazar Najjar on 01/21/2023 10:24:23 Kathrin Ruddy ([id number]) [id number] Page 2 of 7 -------------------------------------------------------------------------------- Physical Exam Details Patient Name: Date of Service: Timothy [name] 01/21/2023 9:30 A M Medical Record Number: [id number] Patient Account Number: [id number] Date of Birth/Sex: Treating RN: 12-09-1970 (52 y.o. M) Primary Care Provider: Sanjuan Dame Other Clinician: Referring Provider: Treating Provider/Extender: Earna Coder in Treatment: 0 Constitutional Patient is hypertensive.. Pulse regular and within target range for patient.Marland Kitchen Respirations regular, non-labored and within target range.. Temperature is normal and within the target range for the patient.Marland Kitchen Appears in no distress. Cardiovascular Pedal pulses are palpable bilaterally. Integumentary (Hair, Skin) I see no generalized skin problem.. Notes Wound exam; there is no open wound. The area in question from the patient's description and the picture from 12/10/2022 has healed over. He has other localized hyperpigmented patches and several areas of both lower legs. He says these start is very pruritic localized areas that he  scratches vigorously. Currently nothing is open and nothing particularly worrisome Electronic Signature(s) Signed: 01/21/2023 4:32:13 PM By: Baltazar Najjar MD Entered By: Baltazar Najjar on 01/21/2023 10:27:21 -------------------------------------------------------------------------------- Physician Orders Details Patient  Name: Date of Service: Timothy [name] 01/21/2023 9:30 A M Medical Record Number: [id number] Patient Account Number: [id number] Date of Birth/Sex: Treating RN: 02-14-1970 (52 y.o. Tammy Sours Primary Care Provider: Sanjuan Dame Other Clinician: Referring Provider: Treating Provider/Extender: Earna Coder in Treatment: 0 The following information was scribed by: Shawn Stall The information was scribed for: Baltazar Najjar Verbal / Phone Orders: No Diagnosis Coding Discharge From [facility] Services Discharge from Wound Care Center - Call if any future wound care needs. closely monitor feet and legs every night before bed. lotion daily. Edema Control - Orders / Instructions Exercise regularly Moisturize legs daily. Electronic Signature(s) Signed: 01/21/2023 4:32:13 PM By: Baltazar Najjar MD Signed: 01/22/2023 5:35:56 PM By: Shawn Stall RN, BSN Entered By: Shawn Stall on 01/21/2023 09:44:10 Problem List Details -------------------------------------------------------------------------------- Kathrin Ruddy ([id number]) [id number] Page 3 of 7 Patient Name: Date of Service: Timothy [name] 01/21/2023 9:30 A M Medical Record Number: [id number] Patient Account Number: [id number] Date of Birth/Sex: Treating RN: 02-13-1970 (52 y.o. M) Primary Care Provider: Sanjuan Dame Other Clinician: Referring Provider: Treating Provider/Extender: Earna Coder in Treatment: 0 Active Problems ICD-10 Encounter Code Description Active Date MDM Diagnosis L97.811  Non-pressure chronic ulcer of other part of right lower leg limited to breakdown 01/21/2023 No Yes of skin E11.622 Type 2 diabetes mellitus with other skin ulcer 01/21/2023 No Yes E11.22 Type 2 diabetes mellitus with diabetic chronic kidney disease 01/21/2023 No Yes Inactive Problems Resolved Problems Electronic Signature(s) Signed: 01/21/2023 4:32:13 PM By: Baltazar Najjar MD Entered By: Baltazar Najjar on 01/21/2023 10:19:12 -------------------------------------------------------------------------------- Progress Note Details Patient Name: Date of Service: Timothy Killings E. 01/21/2023 9:30 A M Medical Record Number: [id number] Patient Account Number: [id number] Date of Birth/Sex: Treating RN: Oct 09, 1970 (52 y.o. 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 He tells me that several weeks ago he developed a wound on the right anterior lower leg. There is a picture of this from 12/10/2022 at which time he was seeing podiatry for annual diabetic foot exams. This looks like a superficial wound area. He tells me since he had that picture taken this is actually epithelialized over. He complains about exceptionally pruritic areas on his skin that he scratches vigorously and that is how this particular area started. He says the pruritus is worse in the shower. The area really looks like a superficial skin abrasion. He says this was from scratching. He tells me that he has had other similar areas here in fact looking at the  skin on both legs there are darkened areas which she says are areas that develop intense pruritus. He has been told this is from his chronic kidney disease. Past medical history includes congestive heart failure, chronic kidney disease stage V, type 2 diabetes, hyperlipidemia, hypertension, right knee pain for which she follows with orthopedics We did not do ABIs in the clinic as there is no open wound Patient History Information obtained from Patient, Chart. Timothy [name], Timothy [name] ([id number]) [id number] Page 4 of 7  Allergies No Known Allergies Family History Unknown History. Social History Never smoker, Marital Status - Single, Alcohol Use - Never, Drug Use - No History, Caffeine Use - Never. Medical History Hematologic/Lymphatic Patient has history of Anemia Cardiovascular Patient has history of Congestive Heart Failure, Hypertension Endocrine Patient has history of Type II Diabetes Hospitalization/Surgery History - colonoscopy with propofol. - biopsy. - AV fistulagram. - peripheral vasc intervention-embolization coil. - left av fistula placement. - cataract extraction. - left meniscus repair. Medical A Surgical History Notes nd Cardiovascular HLD Genitourinary CKD- STAGE 4 ON DIALYSIS Review of Systems (ROS) Constitutional Symptoms (General Health) Denies complaints or symptoms of Fatigue, Fever, Chills, Marked Weight Change. Eyes blind left eye Ear/Nose/Mouth/Throat Denies complaints or symptoms of Chronic sinus problems or rhinitis. Respiratory Denies complaints or symptoms of Chronic or frequent coughs, Shortness of Breath. Gastrointestinal Denies complaints or symptoms of Frequent diarrhea, Nausea, Vomiting. Integumentary (Skin) Complains or has symptoms of Wounds. Musculoskeletal Denies complaints or symptoms of Muscle Pain, Muscle Weakness. Neurologic Denies complaints or symptoms of Numbness/parasthesias. Psychiatric Denies complaints or  symptoms of Claustrophobia. Objective Constitutional Patient is hypertensive.. Pulse regular and within target range for patient.Marland Kitchen Respirations regular, non-labored and within target range.. Temperature is normal and within the target range for the patient.Marland Kitchen Appears in no distress. Vitals Time Taken: 9:35 AM, Height: 66 in, Source: Stated, Weight: 176 lbs, Source: Stated, BMI: 28.4, Temperature: 99 F, Pulse: 70 bpm, Respiratory Rate: 20 breaths/min, Blood Pressure: 148/83 mmHg, Capillary Blood Glucose: 120 mg/dl. Cardiovascular Pedal pulses are palpable bilaterally. General Notes: Wound exam; there is no open wound. The area in question from the patient's description and the picture from 12/10/2022 has healed over. He has other localized hyperpigmented patches and several areas of both lower legs. He says these start is very pruritic localized areas that he scratches vigorously. Currently nothing is open and nothing particularly worrisome Integumentary (Hair, Skin) I see no generalized skin problem.. Assessment Active Problems ICD-10 Non-pressure chronic ulcer of other part of right lower leg limited to breakdown of skin Type 2 diabetes mellitus with other skin ulcer Type 2 diabetes mellitus with diabetic chronic kidney disease Timothy [name], Timothy [name] ([id number]) [id number] Page 5 of 7 Plan Discharge From [facility] Services: Discharge from Wound Care Center - Call if any future wound care needs. closely monitor feet and legs every night before bed. lotion daily. Edema Control - Orders / Instructions: Exercise regularly Moisturize legs daily. 1. Wound on the right anterior lower leg according the patient secondary to vigorous scratching for a localized area of pruritus. He does not seem to have generalized pruritus which is the more usual presentation and people with chronic renal failure 2. The wound healed I think mostly on its own. 3. I think this is a dermatologic  issue. Lichen simplex chronicus comes to mind. We recommended skin moisturizer for now. I do not think he requires any compression he did not seem to have chronic stasis dermatitis 4. No reason to follow here Electronic Signature(s) Signed: 01/21/2023 4:32:13 PM By: Baltazar Najjar MD Entered By: Baltazar Najjar on 01/21/2023 10:29:06 -------------------------------------------------------------------------------- HxROS Details Patient Name: Date of Service: Timothy Killings E. 01/21/2023 9:30 A M Medical Record Number: [id number] Patient Account Number: [id number] Date of Birth/Sex: Treating RN: May 10, 1970 (52 y.o. Timothy [name] Primary Care Provider: Sanjuan Dame Other Clinician: Referring Provider: Treating Provider/Extender: Earna Coder in Treatment: 0 Information Obtained From Patient Chart Constitutional Symptoms (General Health) Complaints and Symptoms: Negative for:  Fatigue; Fever; Chills; Marked Weight Change Ear/Nose/Mouth/Throat Complaints and Symptoms: Negative for: Chronic sinus problems or rhinitis Respiratory Complaints and Symptoms: Negative for: Chronic or frequent coughs; Shortness of Breath Gastrointestinal Complaints and Symptoms: Negative for: Frequent diarrhea; Nausea; Vomiting Integumentary (Skin) Complaints and Symptoms: Positive for: Wounds Musculoskeletal Complaints and Symptoms: Negative for: Muscle Pain; Muscle Weakness Neurologic Timothy [name], Timothy [name] ([id number]) [id number] Page 6 of 7 Complaints and Symptoms: Negative for: Numbness/parasthesias Psychiatric Complaints and Symptoms: Negative for: Claustrophobia Eyes Complaints and Symptoms: Review of System Notes: blind left eye Hematologic/Lymphatic Medical History: Positive for: Anemia Cardiovascular Medical History: Positive for: Congestive Heart Failure; Hypertension Past Medical History  Notes: HLD Endocrine Medical History: Positive for: Type II Diabetes Genitourinary Medical History: Past Medical History Notes: CKD- STAGE 4 ON DIALYSIS Immunological Oncologic Immunizations Pneumococcal Vaccine: Received Pneumococcal Vaccination: No Implantable Devices No devices added Hospitalization / Surgery History Type of Hospitalization/Surgery colonoscopy with propofol biopsy AV fistulagram peripheral vasc intervention-embolization coil left av fistula placement cataract extraction left meniscus repair Family and Social History Unknown History: Yes; Never smoker; Marital Status - Single; Alcohol Use: Never; Drug Use: No History; Caffeine Use: Never Social Determinants of Health (SDOH) 1. In the past 2 months, did you or others you live with eat smaller meals or skip meals because you didn't have money for foodo : No 2. Are you homeless or worried that you might be in the futureo : No 3. Do you have trouble paying for your utilities (gas, electricity, phone)o : No 4. Do you have trouble finding or paying for a rideo : No 5. Do you need daycare, or better daycare, for your kidso : No 6. Are you unemployed or without regular incomeo : No 7. Do you need help finding a better jobo : No 8. Do you need help getting more educationo : No 9. Are you concerned about someone in your home using drugs or alcoholo : No 10. Do you feel unsafe in your daily lifeo : No 11. Is anyone in your home threatening or abusing youo : No 12. Do you lack quality relationships that make you feel valued and supportedo : No 13. Do you need help getting cultural information in a language you understando : No 14. Do you need help getting internet accesso : No Timothy [name], Timothy [name] ([id number]) [id number] Page 7 of 7 Advanced Directives and Instructions Spiritual or Cultural beliefs preclude asking about Advance Care Planning: No Advanced Directives: No Patient wants  information on Advanced Directives: No Do not resuscitate: No Living Will: No Medical Power of Attorney: No Surrogate Decision Maker: No Electronic Signature(s) Signed: 01/21/2023 4:32:11 PM By: Fonnie Mu RN Signed: 01/21/2023 4:32:13 PM By: Baltazar Najjar MD Entered By: Fonnie Mu on 01/20/2023 11:15:15 -------------------------------------------------------------------------------- SuperBill Details Patient Name: Date of Service: Timothy Killings E. 01/21/2023 Medical Record Number: [id number] Patient Account Number: [id number] Date of Birth/Sex: Treating RN: 1970/03/09 (52 y.o. Tammy Sours Primary Care Provider: Sanjuan Dame Other Clinician: Referring Provider: Treating Provider/Extender: Earna Coder in Treatment: 0 Diagnosis Coding ICD-10 Codes Code Description [phone] Non-pressure chronic ulcer of other part of right lower leg limited to breakdown of skin E11.622 Type 2 diabetes mellitus with other skin ulcer E11.22 Type 2 diabetes mellitus with diabetic chronic kidney disease Facility Procedures : CPT4 Code: [id number] Description: 99213 - WOUND CARE VISIT-LEV 3 EST PT Modifier: Quantity: 1 Physician Procedures : CPT4 Code Description Modifier [id number] - WC PHYS LEVEL 2 - NEW PT  ICD-10 Diagnosis Description L97.811 Non-pressure chronic ulcer of other part of right lower leg limited to breakdown of skin E11.622 Type 2 diabetes mellitus with other skin ulcer  E11.22 Type 2 diabetes mellitus with diabetic chronic kidney disease Quantity: 1 Electronic Signature(s) Signed: 01/21/2023 4:32:13 PM By: Baltazar Najjar MD Entered By: Baltazar Najjar on 01/21/2023 10:29:50

## 2023-01-23 NOTE — Patient Instructions (Signed)

## 2023-01-23 NOTE — Progress Notes (Signed)
Outpatient Endocrinology Note Timothy [name], MD  01/23/23   Kaleeb Weaver [name] Dec 26, 1970 [id number]  Referring Provider: [name], [name], MD Primary Care Provider: [name], [name], MD Reason for consultation: Subjective   Assessment & Plan  Timothy [name] was seen today for establish care.  Diagnoses and all orders for this visit:  Type 2 diabetes mellitus with diabetic neuropathy, with long-term current use of insulin (HCC)  Long term (current) use of oral hypoglycemic drugs  Pure hypercholesterolemia  Other orders -     Continuous Glucose Sensor (DEXCOM G7 SENSOR) MISC; 1 Device by Does not apply route continuous.    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, neuropathy, ESRD on HD  C-peptide + at 5.4 in 10/31/22 Lab Results  Component Value Date   GFR 7.22 (LL) 11/07/2022   Hba1c goal less than 7, current Hba1c is 6.3 on 01/2023 Lab Results  Component Value Date   HGBA1C 5.8 (H) 08/05/2022   Will recommend the following: Farxiga 10 mg every day  Lantus 8 units at bedtime Pt will bring his [name], we will out it on today  Will adjust insulin once I have access to blood sugars  No known contraindications/side effects to any of above medications  -Last LD and Tg are as follows: Lab Results  Component Value Date   LDLCALC 147 (H) 10/12/2020    Lab Results  Component Value Date   TRIG 84 10/12/2020   -not on statin, ESRD pt on HD -Follow low fat diet and exercise   -Blood pressure goal <140/90 - Microalbumin/creatinine goal is < 30 -Last MA/Cr is as follows: No results found for: "MICROALBUR", "MALB24HUR" -on ACE/ARB sacubitril-valsartan (ENTRESTO) 97-103 MG -diet changes including salt restriction -limit eating outside -counseled BP targets per standards of diabetes care -uncontrolled blood pressure can lead to retinopathy, nephropathy and cardiovascular and atherosclerotic heart disease  Reviewed and counseled on: -A1C target -Blood sugar  targets -Complications of uncontrolled diabetes  -Checking blood sugar before meals and bedtime and bring log next visit -All medications with mechanism of action and side effects -Hypoglycemia management: rule of 15's, Glucagon Emergency Kit and medical alert ID -low-carb low-fat plate-method diet -At least 20 minutes of physical activity per day -Annual dilated retinal eye exam and foot exam -compliance and follow up needs -follow up as scheduled or earlier if problem gets worse  Call if blood sugar is less than 70 or consistently above 250    Take a 15 gm snack of carbohydrate at bedtime before you go to sleep if your blood sugar is less than 100.    If you are going to fast after midnight for a test or procedure, ask your physician for instructions on how to reduce/decrease your insulin dose.    Call if blood sugar is less than 70 or consistently above 250  -Treating a low sugar by rule of 15  (15 gms of sugar every 15 min until sugar is more than 70) If you feel your sugar is low, test your sugar to be sure If your sugar is low (less than 70), then take 15 grams of a fast acting Carbohydrate (3-4 glucose tablets or glucose gel or 4 ounces of juice or regular soda) Recheck your sugar 15 min after treating low to make sure it is more than 70 If sugar is still less than 70, treat again with 15 grams of carbohydrate          Don't drive the hour of  hypoglycemia  If unconscious/unable to eat or drink by mouth, use glucagon injection or nasal spray baqsimi and call 911. Can repeat again in 15 min if still unconscious.  Return in about 11 days (around 02/03/2023).   I have reviewed current medications, nurse's notes, allergies, vital signs, past medical and surgical history, family medical history, and social history for this encounter. Counseled patient on symptoms, examination findings, lab findings, imaging results, treatment decisions and monitoring and prognosis. The patient  understood the recommendations and agrees with the treatment plan. All questions regarding treatment plan were fully answered.  Timothy [place], MD  01/23/23    History of Present Illness Timothy [name] is a 52 y.o. year old male who presents for evaluation of Type [name] diabetes mellitus.  Timothy [name] was first diagnosed in 2004.   Diabetes education +  Home diabetes regimen: Farxiga 10 mg every day  Lantus 8 units at bedtime  COMPLICATIONS -  MI/Stroke +  retinopathy +  neuropathy +  nephropathy ESRD on M/W/F  SYMPTOMS REVIEWED - Polyuria - Weight loss + Blurred vision  BLOOD SUGAR DATA Checks fasting BG reports around 120  Physical Exam  BP 138/82 (BP Location: Right Arm, Patient Position: Sitting, Cuff Size: Large)   Pulse 84   Resp (!) 95   Ht 5\' 6"  (1.676 m)   Wt 174 lb 3.2 oz (79 kg)   BMI 28.12 kg/m    Constitutional: well developed, well nourished Head: normocephalic, atraumatic Eyes: sclera anicteric, no redness Neck: supple Lungs: normal respiratory effort Neurology: alert and oriented Skin: dry, no appreciable rashes Musculoskeletal: no appreciable defects Psychiatric: normal mood and affect Diabetic Foot Exam - Simple   No data filed      Current Medications Patient's Medications  New Prescriptions   CONTINUOUS GLUCOSE SENSOR (DEXCOM G7 SENSOR) MISC    1 Device by Does not apply route continuous.  Previous Medications   ACETAMINOPHEN (TYLENOL) 500 MG TABLET    Take 1 tablet (500 mg total) by mouth every 8 (eight) hours for 10 days.   AMLODIPINE (NORVASC) 10 MG TABLET    Take 10 mg by mouth daily.   ASPIRIN EC 325 MG TABLET    Take 1 tablet (325 mg total) by mouth daily.   CARVEDILOL (COREG) 25 MG TABLET    Take 25 mg by mouth 2 (two) times daily.   CONTINUOUS GLUCOSE SENSOR (FREESTYLE LIBRE 3 SENSOR) MISC    1 each by Does not apply route See admin instructions. Replace every 14 days   DOXERCALCIFEROL (HECTOROL IV)     Doxercalciferol (Hectorol)   FARXIGA 10 MG TABS TABLET    Take 10 mg by mouth daily.   FUROSEMIDE (LASIX) 80 MG TABLET    Take 80 mg by mouth every Tuesday, Thursday, Saturday, and Sunday.   GENTAMICIN CREAM (GARAMYCIN) 0.1 %    Apply 1 Application topically daily. Apply to right leg once daily.   HYDRALAZINE (APRESOLINE) 100 MG TABLET    Take 50 mg by mouth 3 (three) times daily. MAY TAKE EXTRA 50MG  FOR SBP>160   HYDROXYZINE (VISTARIL) 25 MG CAPSULE    Take 25 mg by mouth at bedtime.   IBUPROFEN (ADVIL) 800 MG TABLET    Take 1 tablet (800 mg total) by mouth every 8 (eight) hours for 10 days. Please take with food, please alternate with acetaminophen   INSULIN GLARGINE (LANTUS SOLOSTAR) 100 UNIT/ML SOLOSTAR PEN    INJECT 5 UINTS INTO THE SKIN  DAILY   INSULIN PEN NEEDLE (PEN NEEDLES) 31G X 5 MM MISC    1 each by Does not apply route daily.   ISOSORBIDE MONONITRATE (IMDUR) 30 MG 24 HR TABLET    Take 30 mg by mouth daily.   ISOSORBIDE MONONITRATE (IMDUR) 60 MG 24 HR TABLET    Take 60 mg by mouth daily.   LABETALOL (NORMODYNE) 200 MG TABLET    Take 400 mg by mouth 2 (two) times daily.   LEVOCETIRIZINE (XYZAL) 5 MG TABLET    Take 5 mg by mouth daily.   LOSARTAN (COZAAR) 25 MG TABLET    Take 25 mg by mouth daily.   METHOXY PEG-EPOETIN BETA (MIRCERA IJ)    Mircera   METOLAZONE (ZAROXOLYN) 2.5 MG TABLET    Take 2.5 mg by mouth daily.   METOPROLOL SUCCINATE (TOPROL-XL) 100 MG 24 HR TABLET    Take 1 tablet (100 mg total) by mouth daily.   OXYCODONE (ROXICODONE) 5 MG IMMEDIATE RELEASE TABLET    Take 1 tablet (5 mg total) by mouth every 4 (four) hours as needed for severe pain (pain score 7-10) or breakthrough pain.   PANTOPRAZOLE (PROTONIX) 40 MG TABLET    Take 1 tablet (40 mg total) by mouth 2 (two) times daily before a meal.   SACUBITRIL-VALSARTAN (ENTRESTO) 97-103 MG    Take 1 tablet by mouth 2 (two) times daily.   SPIRONOLACTONE (ALDACTONE) 25 MG TABLET    Take 25 mg by mouth daily.   TORSEMIDE  (DEMADEX) 20 MG TABLET    Take 20 mg by mouth 2 (two) times daily.  Modified Medications   No medications on file  Discontinued Medications   TRESIBA FLEXTOUCH 100 UNIT/ML FLEXTOUCH PEN    Inject 2.5 Units into the skin at bedtime.    Allergies No Known Allergies  Past Medical History Past Medical History:  Diagnosis Date   Allergies    Anemia    Blind left eye    CHF (congestive heart failure) (HCC)    Chronic kidney disease    stage 4   Diabetes mellitus without complication (HCC)    Dyspnea    Patient denies SOB since he started dialysis. States he went away after he started dialysis   HLD (hyperlipidemia)    Hypertension    Pneumonia     Past Surgical History Past Surgical History:  Procedure Laterality Date   A/V FISTULAGRAM Left 11/19/2022   Procedure: A/V Fistulagram;  Surgeon: Nada Libman, MD;  Location: [facility];  Service: Cardiovascular;  Laterality: Left;   AV FISTULA PLACEMENT Left 09/19/2022   Procedure: ARTERIOVENOUS (AV) FISTULA CREATION;  Surgeon: Nada Libman, MD;  [facility];  Service: Vascular;  Laterality: Left;   BIOPSY  12/05/2022   Procedure: BIOPSY;  Surgeon: Napoleon Form, MD;  Location: [facility];  Service: Gastroenterology;;   CATARACT EXTRACTION Right    COLONOSCOPY WITH PROPOFOL N/A 12/05/2022   Procedure: COLONOSCOPY WITH PROPOFOL;  Surgeon: Napoleon Form, MD;  Location: [facility];  Service: Gastroenterology;  Laterality: N/A;   ESOPHAGOGASTRODUODENOSCOPY (EGD) WITH PROPOFOL N/A 12/05/2022   Procedure: ESOPHAGOGASTRODUODENOSCOPY (EGD) WITH PROPOFOL;  Surgeon: Napoleon Form, MD;  Location: [facility];  Service: Gastroenterology;  Laterality: N/A;   MENISCUS REPAIR Left    PERIPHERAL VASCULAR INTERVENTION  11/19/2022   Procedure: PERIPHERAL VASCULAR INTERVENTION;  Surgeon: Nada Libman, MD;  Location: [facility];  Service: Cardiovascular;;  Embolization coil   POLYPECTOMY  12/05/2022  Procedure: POLYPECTOMY;  Surgeon: Napoleon Form, MD;  Location: [facility];  Service: Gastroenterology;;    Family History family history includes Diabetes in his brother and mother; Heart attack in his mother; Hypertension in his mother.  Social History Social History   Socioeconomic History   Marital status: Single    Spouse name: Not on file   Number of children: 1   Years of education: Not on file   Highest education level: Not on file  Occupational History   Occupation: disabled  Tobacco Use   Smoking status: Never    Passive exposure: Never   Smokeless tobacco: Never  Vaping Use   Vaping status: Never Used  Substance and Sexual Activity   Alcohol use: Not Currently   Drug use: Not Currently   Sexual activity: Not on file  Other Topics Concern   Not on file  Social History Narrative   Not on file   Social Drivers of Health   Financial Resource Strain: Low Risk  (12/19/2022)   Overall Financial Resource Strain (CARDIA)    Difficulty of Paying Living Expenses: Not hard at all  Food Insecurity: No Food Insecurity (12/19/2022)   Hunger Vital Sign    Worried About Running Out of Food in the Last Year: Never true    Ran Out of Food in the Last Year: Never true  Transportation Needs: No Transportation Needs (12/19/2022)   PRAPARE - Administrator, Civil Service (Medical): No    Lack of Transportation (Non-Medical): No  Physical Activity: Insufficiently Active (12/19/2022)   Exercise Vital Sign    Days of Exercise per Week: 2 days    Minutes of Exercise per Session: 60 min  Stress: No Stress Concern Present (12/19/2022)   [employer] of Occupational Health - Occupational Stress Questionnaire    Feeling of Stress : Not at all  Social Connections: Moderately Integrated (12/19/2022)   Social Connection and Isolation Panel [NHANES]    Frequency of Communication with Friends and Family: More than three times a week    Frequency of Social  Gatherings with Friends and Family: Once a week    Attends Religious Services: More than 4 times per year    Active Member of [employer] or Organizations: No    Attends Banker Meetings: Never    Marital Status: Living with partner  Intimate Partner Violence: Not At Risk (12/19/2022)   Humiliation, Afraid, Rape, and Kick questionnaire    Fear of Current or Ex-Partner: No    Emotionally Abused: No    Physically Abused: No    Sexually Abused: No    Lab Results  Component Value Date   HGBA1C 5.8 (H) 08/05/2022   HGBA1C 8.6 (H) 07/31/2021   HGBA1C 7.6 (A) 01/18/2021   HGBA1C 7.6 01/18/2021   Lab Results  Component Value Date   CHOL 204 (H) 10/12/2020   Lab Results  Component Value Date   HDL 42 10/12/2020   Lab Results  Component Value Date   LDLCALC 147 (H) 10/12/2020   Lab Results  Component Value Date   TRIG 84 10/12/2020   Lab Results  Component Value Date   CHOLHDL 4.9 10/12/2020   Lab Results  Component Value Date   CREATININE 9.10 (H) 12/05/2022   Lab Results  Component Value Date   GFR 7.22 (LL) 11/07/2022   No results found for: "MICROALBUR", "MALB24HUR"    Component Value Date/Time   NA 142 12/05/2022 0930   NA 139 08/05/2022 1507  K 4.0 12/05/2022 0930   CL 102 12/05/2022 0930   CO2 33 (H) 11/07/2022 1245   GLUCOSE 110 (H) 12/05/2022 0930   BUN 23 (H) 12/05/2022 0930   BUN 76 (HH) 08/05/2022 1507   CREATININE 9.10 (H) 12/05/2022 0930   CALCIUM 8.5 11/07/2022 1245   PROT 8.2 11/07/2022 1245   PROT 7.0 08/05/2022 1507   ALBUMIN 3.9 11/07/2022 1245   ALBUMIN 3.5 (L) 08/05/2022 1507   AST 14 11/07/2022 1245   ALT 11 11/07/2022 1245   ALKPHOS 77 11/07/2022 1245   BILITOT 0.4 11/07/2022 1245   BILITOT 0.3 08/05/2022 1507   GFRNONAA 6 (L) 08/21/2022 1851   GFRAA 51 (L) 06/21/2019 0511      Latest Ref Rng & Units 12/05/2022    9:30 AM 11/19/2022    5:48 AM 11/07/2022   12:45 PM  BMP  Glucose 70 - 99 mg/dL 564  332  951   BUN 6 -  20 mg/dL 23  28  28    Creatinine 0.61 - 1.24 mg/dL 8.84  1.66  0.63   Sodium 135 - 145 mmol/L 142  141  140   Potassium 3.5 - 5.1 mmol/L 4.0  4.0  3.9   Chloride 98 - 111 mmol/L 102  101  99   CO2 19 - 32 mEq/L   33   Calcium 8.4 - 10.5 mg/dL   8.5        Component Value Date/Time   WBC 5.2 11/07/2022 1245   RBC 4.53 11/07/2022 1245   HGB 12.6 (L) 12/05/2022 0930   HGB 7.5 (L) 08/05/2022 1507   HCT 37.0 (L) 12/05/2022 0930   HCT 23.4 (L) 08/05/2022 1507   PLT 272.0 11/07/2022 1245   PLT 297 08/05/2022 1507   MCV 80.2 11/07/2022 1245   MCV 79 08/05/2022 1507   MCH 25.4 (L) 08/21/2022 2053   MCHC 31.9 11/07/2022 1245   RDW 17.1 (H) 11/07/2022 1245   RDW 17.1 (H) 08/05/2022 1507   LYMPHSABS 1.1 11/07/2022 1245   LYMPHSABS 0.7 08/05/2022 1507   MONOABS 0.9 11/07/2022 1245   EOSABS 0.3 11/07/2022 1245   EOSABS 0.4 08/05/2022 1507   BASOSABS 0.0 11/07/2022 1245   BASOSABS 0.0 08/05/2022 1507     Parts of this note may have been dictated using voice recognition software. There may be variances in spelling and vocabulary which are unintentional. Not all errors are proofread. Please notify the Thereasa Parkin if any discrepancies are noted or if the meaning of any statement is not clear.

## 2023-01-24 DIAGNOSIS — D631 Anemia in chronic kidney disease: Secondary | ICD-10-CM | POA: Diagnosis not present

## 2023-01-24 DIAGNOSIS — N186 End stage renal disease: Secondary | ICD-10-CM | POA: Diagnosis not present

## 2023-01-24 DIAGNOSIS — D509 Iron deficiency anemia, unspecified: Secondary | ICD-10-CM | POA: Diagnosis not present

## 2023-01-24 DIAGNOSIS — Z992 Dependence on renal dialysis: Secondary | ICD-10-CM | POA: Diagnosis not present

## 2023-01-24 DIAGNOSIS — N2581 Secondary hyperparathyroidism of renal origin: Secondary | ICD-10-CM | POA: Diagnosis not present

## 2023-01-24 DIAGNOSIS — D689 Coagulation defect, unspecified: Secondary | ICD-10-CM | POA: Diagnosis not present

## 2023-01-24 DIAGNOSIS — E1122 Type 2 diabetes mellitus with diabetic chronic kidney disease: Secondary | ICD-10-CM | POA: Diagnosis not present

## 2023-01-26 DIAGNOSIS — N2581 Secondary hyperparathyroidism of renal origin: Secondary | ICD-10-CM | POA: Diagnosis not present

## 2023-01-26 DIAGNOSIS — D689 Coagulation defect, unspecified: Secondary | ICD-10-CM | POA: Diagnosis not present

## 2023-01-26 DIAGNOSIS — E1122 Type 2 diabetes mellitus with diabetic chronic kidney disease: Secondary | ICD-10-CM | POA: Diagnosis not present

## 2023-01-26 DIAGNOSIS — D509 Iron deficiency anemia, unspecified: Secondary | ICD-10-CM | POA: Diagnosis not present

## 2023-01-26 DIAGNOSIS — N186 End stage renal disease: Secondary | ICD-10-CM | POA: Diagnosis not present

## 2023-01-26 DIAGNOSIS — D631 Anemia in chronic kidney disease: Secondary | ICD-10-CM | POA: Diagnosis not present

## 2023-01-26 DIAGNOSIS — Z992 Dependence on renal dialysis: Secondary | ICD-10-CM | POA: Diagnosis not present

## 2023-01-31 DIAGNOSIS — N186 End stage renal disease: Secondary | ICD-10-CM | POA: Diagnosis not present

## 2023-01-31 DIAGNOSIS — Z992 Dependence on renal dialysis: Secondary | ICD-10-CM | POA: Diagnosis not present

## 2023-01-31 DIAGNOSIS — D509 Iron deficiency anemia, unspecified: Secondary | ICD-10-CM | POA: Diagnosis not present

## 2023-01-31 DIAGNOSIS — D689 Coagulation defect, unspecified: Secondary | ICD-10-CM | POA: Diagnosis not present

## 2023-01-31 DIAGNOSIS — N2581 Secondary hyperparathyroidism of renal origin: Secondary | ICD-10-CM | POA: Diagnosis not present

## 2023-01-31 DIAGNOSIS — D631 Anemia in chronic kidney disease: Secondary | ICD-10-CM | POA: Diagnosis not present

## 2023-01-31 DIAGNOSIS — E1122 Type 2 diabetes mellitus with diabetic chronic kidney disease: Secondary | ICD-10-CM | POA: Diagnosis not present

## 2023-02-02 DIAGNOSIS — D631 Anemia in chronic kidney disease: Secondary | ICD-10-CM | POA: Diagnosis not present

## 2023-02-02 DIAGNOSIS — N186 End stage renal disease: Secondary | ICD-10-CM | POA: Diagnosis not present

## 2023-02-02 DIAGNOSIS — Z992 Dependence on renal dialysis: Secondary | ICD-10-CM | POA: Diagnosis not present

## 2023-02-02 DIAGNOSIS — N2581 Secondary hyperparathyroidism of renal origin: Secondary | ICD-10-CM | POA: Diagnosis not present

## 2023-02-02 DIAGNOSIS — D689 Coagulation defect, unspecified: Secondary | ICD-10-CM | POA: Diagnosis not present

## 2023-02-02 DIAGNOSIS — E1122 Type 2 diabetes mellitus with diabetic chronic kidney disease: Secondary | ICD-10-CM | POA: Diagnosis not present

## 2023-02-02 DIAGNOSIS — D509 Iron deficiency anemia, unspecified: Secondary | ICD-10-CM | POA: Diagnosis not present

## 2023-02-04 ENCOUNTER — Ambulatory Visit: Payer: 59 | Admitting: "Endocrinology

## 2023-02-04 DIAGNOSIS — D631 Anemia in chronic kidney disease: Secondary | ICD-10-CM | POA: Diagnosis not present

## 2023-02-04 DIAGNOSIS — D509 Iron deficiency anemia, unspecified: Secondary | ICD-10-CM | POA: Diagnosis not present

## 2023-02-04 DIAGNOSIS — N186 End stage renal disease: Secondary | ICD-10-CM | POA: Diagnosis not present

## 2023-02-04 DIAGNOSIS — Z992 Dependence on renal dialysis: Secondary | ICD-10-CM | POA: Diagnosis not present

## 2023-02-04 DIAGNOSIS — E1122 Type 2 diabetes mellitus with diabetic chronic kidney disease: Secondary | ICD-10-CM | POA: Diagnosis not present

## 2023-02-04 DIAGNOSIS — D689 Coagulation defect, unspecified: Secondary | ICD-10-CM | POA: Diagnosis not present

## 2023-02-04 DIAGNOSIS — N2581 Secondary hyperparathyroidism of renal origin: Secondary | ICD-10-CM | POA: Diagnosis not present

## 2023-02-07 DIAGNOSIS — Z992 Dependence on renal dialysis: Secondary | ICD-10-CM | POA: Diagnosis not present

## 2023-02-07 DIAGNOSIS — E1122 Type 2 diabetes mellitus with diabetic chronic kidney disease: Secondary | ICD-10-CM | POA: Diagnosis not present

## 2023-02-07 DIAGNOSIS — D631 Anemia in chronic kidney disease: Secondary | ICD-10-CM | POA: Diagnosis not present

## 2023-02-07 DIAGNOSIS — D689 Coagulation defect, unspecified: Secondary | ICD-10-CM | POA: Diagnosis not present

## 2023-02-07 DIAGNOSIS — R52 Pain, unspecified: Secondary | ICD-10-CM | POA: Diagnosis not present

## 2023-02-07 DIAGNOSIS — N2581 Secondary hyperparathyroidism of renal origin: Secondary | ICD-10-CM | POA: Diagnosis not present

## 2023-02-07 DIAGNOSIS — N186 End stage renal disease: Secondary | ICD-10-CM | POA: Diagnosis not present

## 2023-02-10 ENCOUNTER — Ambulatory Visit: Payer: 59 | Admitting: "Endocrinology

## 2023-02-10 DIAGNOSIS — R52 Pain, unspecified: Secondary | ICD-10-CM | POA: Diagnosis not present

## 2023-02-10 DIAGNOSIS — D631 Anemia in chronic kidney disease: Secondary | ICD-10-CM | POA: Diagnosis not present

## 2023-02-10 DIAGNOSIS — Z992 Dependence on renal dialysis: Secondary | ICD-10-CM | POA: Diagnosis not present

## 2023-02-10 DIAGNOSIS — D689 Coagulation defect, unspecified: Secondary | ICD-10-CM | POA: Diagnosis not present

## 2023-02-10 DIAGNOSIS — E1122 Type 2 diabetes mellitus with diabetic chronic kidney disease: Secondary | ICD-10-CM | POA: Diagnosis not present

## 2023-02-10 DIAGNOSIS — N2581 Secondary hyperparathyroidism of renal origin: Secondary | ICD-10-CM | POA: Diagnosis not present

## 2023-02-10 DIAGNOSIS — N186 End stage renal disease: Secondary | ICD-10-CM | POA: Diagnosis not present

## 2023-02-12 DIAGNOSIS — N186 End stage renal disease: Secondary | ICD-10-CM | POA: Diagnosis not present

## 2023-02-12 DIAGNOSIS — D631 Anemia in chronic kidney disease: Secondary | ICD-10-CM | POA: Diagnosis not present

## 2023-02-12 DIAGNOSIS — R52 Pain, unspecified: Secondary | ICD-10-CM | POA: Diagnosis not present

## 2023-02-12 DIAGNOSIS — E1122 Type 2 diabetes mellitus with diabetic chronic kidney disease: Secondary | ICD-10-CM | POA: Diagnosis not present

## 2023-02-12 DIAGNOSIS — Z992 Dependence on renal dialysis: Secondary | ICD-10-CM | POA: Diagnosis not present

## 2023-02-12 DIAGNOSIS — N2581 Secondary hyperparathyroidism of renal origin: Secondary | ICD-10-CM | POA: Diagnosis not present

## 2023-02-12 DIAGNOSIS — D689 Coagulation defect, unspecified: Secondary | ICD-10-CM | POA: Diagnosis not present

## 2023-02-14 DIAGNOSIS — Z992 Dependence on renal dialysis: Secondary | ICD-10-CM | POA: Diagnosis not present

## 2023-02-14 DIAGNOSIS — N2581 Secondary hyperparathyroidism of renal origin: Secondary | ICD-10-CM | POA: Diagnosis not present

## 2023-02-14 DIAGNOSIS — N186 End stage renal disease: Secondary | ICD-10-CM | POA: Diagnosis not present

## 2023-02-14 DIAGNOSIS — D631 Anemia in chronic kidney disease: Secondary | ICD-10-CM | POA: Diagnosis not present

## 2023-02-14 DIAGNOSIS — E1122 Type 2 diabetes mellitus with diabetic chronic kidney disease: Secondary | ICD-10-CM | POA: Diagnosis not present

## 2023-02-14 DIAGNOSIS — R52 Pain, unspecified: Secondary | ICD-10-CM | POA: Diagnosis not present

## 2023-02-14 DIAGNOSIS — D689 Coagulation defect, unspecified: Secondary | ICD-10-CM | POA: Diagnosis not present

## 2023-02-17 ENCOUNTER — Other Ambulatory Visit (HOSPITAL_BASED_OUTPATIENT_CLINIC_OR_DEPARTMENT_OTHER): Payer: Self-pay | Admitting: Family Medicine

## 2023-02-17 DIAGNOSIS — N2581 Secondary hyperparathyroidism of renal origin: Secondary | ICD-10-CM | POA: Diagnosis not present

## 2023-02-17 DIAGNOSIS — N186 End stage renal disease: Secondary | ICD-10-CM | POA: Diagnosis not present

## 2023-02-17 DIAGNOSIS — D631 Anemia in chronic kidney disease: Secondary | ICD-10-CM | POA: Diagnosis not present

## 2023-02-17 DIAGNOSIS — R52 Pain, unspecified: Secondary | ICD-10-CM | POA: Diagnosis not present

## 2023-02-17 DIAGNOSIS — E1122 Type 2 diabetes mellitus with diabetic chronic kidney disease: Secondary | ICD-10-CM | POA: Diagnosis not present

## 2023-02-17 DIAGNOSIS — Z992 Dependence on renal dialysis: Secondary | ICD-10-CM | POA: Diagnosis not present

## 2023-02-17 DIAGNOSIS — D689 Coagulation defect, unspecified: Secondary | ICD-10-CM | POA: Diagnosis not present

## 2023-02-17 NOTE — Telephone Encounter (Signed)
 Copied from CRM [id number]. Topic: Clinical - Medication Refill >> Feb 17, 2023 10:43 AM Farrel B wrote: Most Recent Primary Care Visit:  Provider: DE CUBA, [name]  Department: [facility]  Visit Type: WELCOME TO MEDICARE  Date: 12/19/2022  Medication: insulin  glargine (LANTUS  SOLOSTAR) 100 UNIT/ML Solostar Pen  Has the patient contacted their pharmacy? Yes (Agent: If no, request that the patient contact the pharmacy for the refill. If patient does not wish to contact the pharmacy document the reason why and proceed with request.) (Agent: If yes, when and what did the pharmacy advise?)Call provider for refill   Is this the correct pharmacy for this prescription? Yes If no, delete pharmacy and type the correct one.  This is the patient's preferred pharmacy:  [facility] [address], [place] - [address] [place] [address] Phone: [phone] Fax: [phone]  [facility] [place] [facility] [address] [place] [address] Phone: [phone] Fax: [phone]   Has the prescription been filled recently? Yes  Is the patient out of the medication? Yes  Has the patient been seen for an appointment in the last year OR does the patient have an upcoming appointment? Yes  Can we respond through MyChart? No  Agent: Please be advised that Rx refills may take up to 3 business days. We ask that you follow-up with your pharmacy.

## 2023-02-18 ENCOUNTER — Other Ambulatory Visit (HOSPITAL_BASED_OUTPATIENT_CLINIC_OR_DEPARTMENT_OTHER): Payer: Self-pay | Admitting: *Deleted

## 2023-02-19 ENCOUNTER — Other Ambulatory Visit (HOSPITAL_BASED_OUTPATIENT_CLINIC_OR_DEPARTMENT_OTHER): Payer: Self-pay | Admitting: Orthopaedic Surgery

## 2023-02-19 DIAGNOSIS — N2581 Secondary hyperparathyroidism of renal origin: Secondary | ICD-10-CM | POA: Diagnosis not present

## 2023-02-19 DIAGNOSIS — Z992 Dependence on renal dialysis: Secondary | ICD-10-CM | POA: Diagnosis not present

## 2023-02-19 DIAGNOSIS — R52 Pain, unspecified: Secondary | ICD-10-CM | POA: Diagnosis not present

## 2023-02-19 DIAGNOSIS — N186 End stage renal disease: Secondary | ICD-10-CM | POA: Diagnosis not present

## 2023-02-19 DIAGNOSIS — D631 Anemia in chronic kidney disease: Secondary | ICD-10-CM | POA: Diagnosis not present

## 2023-02-19 DIAGNOSIS — S86811A Strain of other muscle(s) and tendon(s) at lower leg level, right leg, initial encounter: Secondary | ICD-10-CM

## 2023-02-19 DIAGNOSIS — E1122 Type 2 diabetes mellitus with diabetic chronic kidney disease: Secondary | ICD-10-CM | POA: Diagnosis not present

## 2023-02-19 DIAGNOSIS — D689 Coagulation defect, unspecified: Secondary | ICD-10-CM | POA: Diagnosis not present

## 2023-02-20 NOTE — Pre-Procedure Instructions (Signed)
Surgical Instructions   Your procedure is scheduled on March 03, 2023. Report to [facility] Main Entrance "A" at 11:45 A.M., then check in with the Admitting office. Any questions or running late day of surgery: call [phone]  Questions prior to your surgery date: call [phone], Monday-Friday, 8am-4pm. If you experience any cold or flu symptoms such as cough, fever, chills, shortness of breath, etc. between now and your scheduled surgery, please notify us at the above number.     Remember:  Do not eat after midnight the night before your surgery   You may drink clear liquids until 10:45 AM the morning of your surgery.   Clear liquids allowed are: Water, Non-Citrus Juices (without pulp), Carbonated Beverages, Clear Tea (no milk, honey, etc.), Black Coffee Only (NO MILK, CREAM OR POWDERED CREAMER of any kind), and Gatorade.  Patient Instructions  The night before surgery:  No food after midnight. ONLY clear liquids after midnight  The day of surgery (if you have diabetes): Drink ONE (1) 12 oz G2 given to you in your pre admission testing appointment by 10:45 AM the morning of surgery. Drink in one sitting. Do not sip.  This drink was given to you during your hospital  pre-op appointment visit.  Nothing else to drink after completing the  12 oz bottle of G2.         If you have questions, please contact your surgeon's office.    Take these medicines the morning of surgery with A SIP OF WATER: amLODipine (NORVASC)  carvedilol (COREG)  isosorbide mononitrate (IMDUR)  labetalol (NORMODYNE)  levocetirizine (XYZAL)  metoprolol succinate (TOPROL-XL)  pantoprazole (PROTONIX)    May take these medicines IF NEEDED: hydrALAZINE (APRESOLINE)  oxyCODONE (ROXICODONE)    Follow your surgeon's instructions on when to stop Aspirin.  If no instructions were given by your surgeon then you will need to call the office to get those instructions.    One week prior to surgery, STOP  taking any Aleve, Naproxen, Ibuprofen, Motrin, Advil, Goody's, BC's, all herbal medications, fish oil, and non-prescription vitamins.   WHAT DO I DO ABOUT MY DIABETES MEDICATION?   THE NIGHT BEFORE SURGERY, take 4 units of insulin glargine (LANTUS SOLOSTAR).      STOP taking your FARXIGA three days prior to surgery. Your last dose will be January 23rd.   HOW TO MANAGE YOUR DIABETES BEFORE AND AFTER SURGERY  Why is it important to control my blood sugar before and after surgery? Improving blood sugar levels before and after surgery helps healing and can limit problems. A way of improving blood sugar control is eating a healthy diet by:  Eating less sugar and carbohydrates  Increasing activity/exercise  Talking with your doctor about reaching your blood sugar goals High blood sugars (greater than 180 mg/dL) can raise your risk of infections and slow your recovery, so you will need to focus on controlling your diabetes during the weeks before surgery. Make sure that the doctor who takes care of your diabetes knows about your planned surgery including the date and location.  How do I manage my blood sugar before surgery? Check your blood sugar at least 4 times a day, starting 2 days before surgery, to make sure that the level is not too high or low.  Check your blood sugar the morning of your surgery when you wake up and every 2 hours until you get to the Short Stay unit.  If your blood sugar is less than 70 mg/dL, you  will need to treat for low blood sugar: Do not take insulin. Treat a low blood sugar (less than 70 mg/dL) with  cup of clear juice (cranberry or apple), 4 glucose tablets, OR glucose gel. Recheck blood sugar in 15 minutes after treatment (to make sure it is greater than 70 mg/dL). If your blood sugar is not greater than 70 mg/dL on recheck, call [phone] for further instructions. Report your blood sugar to the short stay nurse when you get to Short Stay.  If you are  admitted to the hospital after surgery: Your blood sugar will be checked by the staff and you will probably be given insulin after surgery (instead of oral diabetes medicines) to make sure you have good blood sugar levels. The goal for blood sugar control after surgery is 80-180 mg/dL.                      Do NOT Smoke (Tobacco/Vaping) for 24 hours prior to your procedure.  If you use a CPAP at night, you may bring your mask/headgear for your overnight stay.   You will be asked to remove any contacts, glasses, piercing's, hearing aid's, dentures/partials prior to surgery. Please bring cases for these items if needed.    Patients discharged the day of surgery will not be allowed to drive home, and someone needs to stay with them for 24 hours.  SURGICAL WAITING ROOM VISITATION Patients may have no more than 2 support people in the waiting area - these visitors may rotate.   Pre-op nurse will coordinate an appropriate time for 1 ADULT support person, who may not rotate, to accompany patient in pre-op.  Children under the age of 2 must have an adult with them who is not the patient and must remain in the main waiting area with an adult.  If the patient needs to stay at the hospital during part of their recovery, the visitor guidelines for inpatient rooms apply.  Please refer to the [facility] website for the visitor guidelines for any additional information.   If you received a COVID test during your pre-op visit  it is requested that you wear a mask when out in public, stay away from anyone that may not be feeling well and notify your surgeon if you develop symptoms. If you have been in contact with anyone that has tested positive in the last 10 days please notify you surgeon.      Pre-operative CHG Bathing Instructions   You can play a key role in reducing the risk of infection after surgery. Your skin needs to be as free of germs as possible. You can reduce the number of germs on your  skin by washing with CHG (chlorhexidine gluconate) soap before surgery. CHG is an antiseptic soap that kills germs and continues to kill germs even after washing.   DO NOT use if you have an allergy to chlorhexidine/CHG or antibacterial soaps. If your skin becomes reddened or irritated, stop using the CHG and notify one of our RNs at [phone].              TAKE A SHOWER THE NIGHT BEFORE SURGERY AND THE DAY OF SURGERY    Please keep in mind the following:  DO NOT shave, including legs and underarms, 48 hours prior to surgery.   You may shave your face before/day of surgery.  Place clean sheets on your bed the night before surgery Use a clean washcloth (not used since being washed)  for each shower. DO NOT sleep with pet's night before surgery.  CHG Shower Instructions:  Wash your face and private area with normal soap. If you choose to wash your hair, wash first with your normal shampoo.  After you use shampoo/soap, rinse your hair and body thoroughly to remove shampoo/soap residue.  Turn the water OFF and apply half the bottle of CHG soap to a CLEAN washcloth.  Apply CHG soap ONLY FROM YOUR NECK DOWN TO YOUR TOES (washing for 3-5 minutes)  DO NOT use CHG soap on face, private areas, open wounds, or sores.  Pay special attention to the area where your surgery is being performed.  If you are having back surgery, having someone wash your back for you may be helpful. Wait 2 minutes after CHG soap is applied, then you may rinse off the CHG soap.  Pat dry with a clean towel  Put on clean pajamas    Additional instructions for the day of surgery: DO NOT APPLY any lotions, deodorants, cologne, or perfumes.   Do not wear jewelry or makeup Do not wear nail polish, gel polish, artificial nails, or any other type of covering on natural nails (fingers and toes) Do not bring valuables to the hospital. [facility] is not responsible for valuables/personal belongings. Put on clean/comfortable  clothes.  Please brush your teeth.  Ask your nurse before applying any prescription medications to the skin.

## 2023-02-21 ENCOUNTER — Other Ambulatory Visit: Payer: Self-pay | Admitting: *Deleted

## 2023-02-21 ENCOUNTER — Inpatient Hospital Stay (HOSPITAL_COMMUNITY)
Admission: RE | Admit: 2023-02-21 | Discharge: 2023-02-21 | Disposition: A | Discharge status: Home / Self Care | Payer: 59 | Source: Ambulatory Visit

## 2023-02-21 DIAGNOSIS — N186 End stage renal disease: Secondary | ICD-10-CM | POA: Diagnosis not present

## 2023-02-21 DIAGNOSIS — D631 Anemia in chronic kidney disease: Secondary | ICD-10-CM | POA: Diagnosis not present

## 2023-02-21 DIAGNOSIS — R52 Pain, unspecified: Secondary | ICD-10-CM | POA: Diagnosis not present

## 2023-02-21 DIAGNOSIS — N2581 Secondary hyperparathyroidism of renal origin: Secondary | ICD-10-CM | POA: Diagnosis not present

## 2023-02-21 DIAGNOSIS — Z992 Dependence on renal dialysis: Secondary | ICD-10-CM | POA: Diagnosis not present

## 2023-02-21 DIAGNOSIS — D689 Coagulation defect, unspecified: Secondary | ICD-10-CM | POA: Diagnosis not present

## 2023-02-21 DIAGNOSIS — E1122 Type 2 diabetes mellitus with diabetic chronic kidney disease: Secondary | ICD-10-CM | POA: Diagnosis not present

## 2023-02-24 DIAGNOSIS — N2581 Secondary hyperparathyroidism of renal origin: Secondary | ICD-10-CM | POA: Diagnosis not present

## 2023-02-24 DIAGNOSIS — R52 Pain, unspecified: Secondary | ICD-10-CM | POA: Diagnosis not present

## 2023-02-24 DIAGNOSIS — Z992 Dependence on renal dialysis: Secondary | ICD-10-CM | POA: Diagnosis not present

## 2023-02-24 DIAGNOSIS — E1122 Type 2 diabetes mellitus with diabetic chronic kidney disease: Secondary | ICD-10-CM | POA: Diagnosis not present

## 2023-02-24 DIAGNOSIS — N186 End stage renal disease: Secondary | ICD-10-CM | POA: Diagnosis not present

## 2023-02-24 DIAGNOSIS — D631 Anemia in chronic kidney disease: Secondary | ICD-10-CM | POA: Diagnosis not present

## 2023-02-24 DIAGNOSIS — D689 Coagulation defect, unspecified: Secondary | ICD-10-CM | POA: Diagnosis not present

## 2023-02-25 ENCOUNTER — Other Ambulatory Visit: Payer: Self-pay

## 2023-02-25 ENCOUNTER — Encounter (HOSPITAL_COMMUNITY)
Admission: RE | Admit: 2023-02-25 | Discharge: 2023-02-25 | Disposition: A | Discharge status: Home / Self Care | Payer: 59 | Source: Ambulatory Visit | Attending: Orthopaedic Surgery | Admitting: Orthopaedic Surgery

## 2023-02-25 ENCOUNTER — Encounter (HOSPITAL_COMMUNITY): Payer: Self-pay

## 2023-02-25 VITALS — BP 173/95 | HR 84 | Temp 97.8°F | Resp 18 | Ht 66.0 in | Wt 178.4 lb

## 2023-02-25 DIAGNOSIS — E119 Type 2 diabetes mellitus without complications: Secondary | ICD-10-CM | POA: Insufficient documentation

## 2023-02-25 DIAGNOSIS — Z01818 Encounter for other preprocedural examination: Secondary | ICD-10-CM | POA: Insufficient documentation

## 2023-02-25 DIAGNOSIS — Z794 Long term (current) use of insulin: Secondary | ICD-10-CM | POA: Insufficient documentation

## 2023-02-25 LAB — CBC
HCT: 33.9 % — ABNORMAL LOW (ref 39.0–52.0)
Hemoglobin: 10.9 g/dL — ABNORMAL LOW (ref 13.0–17.0)
MCH: 27.6 pg (ref 26.0–34.0)
MCHC: 32.2 g/dL (ref 30.0–36.0)
MCV: 85.8 fL (ref 80.0–100.0)
Platelets: 194 10*3/uL (ref 150–400)
RBC: 3.95 MIL/uL — ABNORMAL LOW (ref 4.22–5.81)
RDW: 15.9 % — ABNORMAL HIGH (ref 11.5–15.5)
WBC: 5.7 10*3/uL (ref 4.0–10.5)
nRBC: 0 % (ref 0.0–0.2)

## 2023-02-25 LAB — GLUCOSE, CAPILLARY: Glucose-Capillary: 118 mg/dL — ABNORMAL HIGH (ref 70–99)

## 2023-02-25 LAB — HEMOGLOBIN A1C
Hgb A1c MFr Bld: 6 % — ABNORMAL HIGH (ref 4.8–5.6)
Mean Plasma Glucose: 125.5 mg/dL

## 2023-02-25 NOTE — Pre-Procedure Instructions (Signed)
Surgical Instructions   Your procedure is scheduled on March 03, 2023. Report to [facility] Main Entrance "A" at 11:45 A.M., then check in with the Admitting office. Any questions or running late day of surgery: call [phone]  Questions prior to your surgery date: call [phone], Monday-Friday, 8am-4pm. If you experience any cold or flu symptoms such as cough, fever, chills, shortness of breath, etc. between now and your scheduled surgery, please notify us at the above number.     Remember:  Do not eat after midnight the night before your surgery   You may drink clear liquids until 10:45 AM the morning of your surgery.   Clear liquids allowed are: Water, Non-Citrus Juices (without pulp), Carbonated Beverages, Clear Tea (no milk, honey, etc.), Black Coffee Only (NO MILK, CREAM OR POWDERED CREAMER of any kind), and Gatorade.  Patient Instructions  The night before surgery:  No food after midnight. ONLY clear liquids after midnight  The day of surgery (if you have diabetes): Drink ONE (1) 12 oz G2 given to you in your pre admission testing appointment by 10:45 AM the morning of surgery. Drink in one sitting. Do not sip.  This drink was given to you during your hospital  pre-op appointment visit.  Nothing else to drink after completing the  12 oz bottle of G2.         If you have questions, please contact your surgeon's office.    Take these medicines the morning of surgery with A SIP OF WATER: amLODipine (NORVASC)  carvedilol (COREG)  hydrALAZINE (APRESOLINE)  isosorbide mononitrate (IMDUR)  labetalol (NORMODYNE)    May take these medicines IF NEEDED: hydrALAZINE (APRESOLINE)  oxyCODONE (ROXICODONE)    One week prior to surgery, STOP taking any Aspirin (unless otherwise instructed by your surgeon) Aleve, Naproxen, Ibuprofen, Motrin, Advil, Goody's, BC's, all herbal medications, fish oil, and non-prescription vitamins.   WHAT DO I DO ABOUT MY DIABETES  MEDICATION?   THE NIGHT BEFORE SURGERY, take 4 units of insulin glargine (LANTUS SOLOSTAR).      STOP taking your FARXIGA three days prior to surgery. Your last dose will be January 23rd.   HOW TO MANAGE YOUR DIABETES BEFORE AND AFTER SURGERY  Why is it important to control my blood sugar before and after surgery? Improving blood sugar levels before and after surgery helps healing and can limit problems. A way of improving blood sugar control is eating a healthy diet by:  Eating less sugar and carbohydrates  Increasing activity/exercise  Talking with your doctor about reaching your blood sugar goals High blood sugars (greater than 180 mg/dL) can raise your risk of infections and slow your recovery, so you will need to focus on controlling your diabetes during the weeks before surgery. Make sure that the doctor who takes care of your diabetes knows about your planned surgery including the date and location.  How do I manage my blood sugar before surgery? Check your blood sugar at least 4 times a day, starting 2 days before surgery, to make sure that the level is not too high or low.  Check your blood sugar the morning of your surgery when you wake up and every 2 hours until you get to the Short Stay unit.  If your blood sugar is less than 70 mg/dL, you will need to treat for low blood sugar: Do not take insulin. Treat a low blood sugar (less than 70 mg/dL) with  cup of clear juice (cranberry or apple), 4 glucose tablets,  OR glucose gel. Recheck blood sugar in 15 minutes after treatment (to make sure it is greater than 70 mg/dL). If your blood sugar is not greater than 70 mg/dL on recheck, call [phone] for further instructions. Report your blood sugar to the short stay nurse when you get to Short Stay.  If you are admitted to the hospital after surgery: Your blood sugar will be checked by the staff and you will probably be given insulin after surgery (instead of oral diabetes  medicines) to make sure you have good blood sugar levels. The goal for blood sugar control after surgery is 80-180 mg/dL.                      Do NOT Smoke (Tobacco/Vaping) for 24 hours prior to your procedure.  If you use a CPAP at night, you may bring your mask/headgear for your overnight stay.   You will be asked to remove any contacts, glasses, piercing's, hearing aid's, dentures/partials prior to surgery. Please bring cases for these items if needed.    Patients discharged the day of surgery will not be allowed to drive home, and someone needs to stay with them for 24 hours.  SURGICAL WAITING ROOM VISITATION Patients may have no more than 2 support people in the waiting area - these visitors may rotate.   Pre-op nurse will coordinate an appropriate time for 1 ADULT support person, who may not rotate, to accompany patient in pre-op.  Children under the age of 6 must have an adult with them who is not the patient and must remain in the main waiting area with an adult.  If the patient needs to stay at the hospital during part of their recovery, the visitor guidelines for inpatient rooms apply.  Please refer to the [facility] website for the visitor guidelines for any additional information.   If you received a COVID test during your pre-op visit  it is requested that you wear a mask when out in public, stay away from anyone that may not be feeling well and notify your surgeon if you develop symptoms. If you have been in contact with anyone that has tested positive in the last 10 days please notify you surgeon.      Pre-operative CHG Bathing Instructions   You can play a key role in reducing the risk of infection after surgery. Your skin needs to be as free of germs as possible. You can reduce the number of germs on your skin by washing with CHG (chlorhexidine gluconate) soap before surgery. CHG is an antiseptic soap that kills germs and continues to kill germs even after washing.    DO NOT use if you have an allergy to chlorhexidine/CHG or antibacterial soaps. If your skin becomes reddened or irritated, stop using the CHG and notify one of our RNs at [phone].              TAKE A SHOWER THE NIGHT BEFORE SURGERY AND THE DAY OF SURGERY    Please keep in mind the following:  DO NOT shave, including legs and underarms, 48 hours prior to surgery.   You may shave your face before/day of surgery.  Place clean sheets on your bed the night before surgery Use a clean washcloth (not used since being washed) for each shower. DO NOT sleep with pet's night before surgery.  CHG Shower Instructions:  Wash your face and private area with normal soap. If you choose to wash your hair, wash  first with your normal shampoo.  After you use shampoo/soap, rinse your hair and body thoroughly to remove shampoo/soap residue.  Turn the water OFF and apply half the bottle of CHG soap to a CLEAN washcloth.  Apply CHG soap ONLY FROM YOUR NECK DOWN TO YOUR TOES (washing for 3-5 minutes)  DO NOT use CHG soap on face, private areas, open wounds, or sores.  Pay special attention to the area where your surgery is being performed.  If you are having back surgery, having someone wash your back for you may be helpful. Wait 2 minutes after CHG soap is applied, then you may rinse off the CHG soap.  Pat dry with a clean towel  Put on clean pajamas    Additional instructions for the day of surgery: DO NOT APPLY any lotions, deodorants, cologne, or perfumes.   Do not wear jewelry or makeup Do not wear nail polish, gel polish, artificial nails, or any other type of covering on natural nails (fingers and toes) Do not bring valuables to the hospital. [facility] is not responsible for valuables/personal belongings. Put on clean/comfortable clothes.  Please brush your teeth.  Ask your nurse before applying any prescription medications to the skin.

## 2023-02-25 NOTE — Progress Notes (Signed)
PCP - Dr. Ceasar Mons [place] Cardiologist - Dr. Lannie Fields - Last office visit 11/18/2022 Nephrologist - Dr. Malen Gauze  PPM/ICD - Denies Device Orders - n/a Rep Notified - n/a  Chest x-ray - n/a EKG - 08/12/2022 Stress Test - 11/18/2022 ECHO - 09/09/2022 Cardiac Cath - Denies  Sleep Study - Denies CPAP - n/a  Pt is DM2. He just switched from a Libre to Dexcom CGM, but unsure how to place device. Pt is able to finger stick until able to place Dexcom. Normal fasting blood sugar is around 120. CBG at pre-op appointment 118. A1c result pending. Pt has HD on MWF and is planning to have HD the morning before surgery.  Last dose of GLP1 agonist-  n/a GLP1 instructions: n/a GLP2 - Pt instructed to stop [place] three days prior to surgery. Last dose will be January 23rd.  Blood Thinner Instructions: n/a Aspirin Instructions: n/a  ERAS Protcol - Clear liquids until 1045 morning of surgery   PRE-SURGERY Ensure or G2- G2 given to pt with instructions  COVID TEST- n/a   Anesthesia review: Yes. Hx of HTN, CHF, DM2, ESRD with HD and work-up for kidney/pancreas transplant.   Patient denies shortness of breath, fever, cough and chest pain at PAT appointment. Pt denies any respiratory illness/infection in the last two months.    All instructions explained to the patient, with a verbal understanding of the material. Patient agrees to go over the instructions while at home for a better understanding. Patient also instructed to self quarantine after being tested for COVID-19. The opportunity to ask questions was provided.

## 2023-02-26 DIAGNOSIS — N2581 Secondary hyperparathyroidism of renal origin: Secondary | ICD-10-CM | POA: Diagnosis not present

## 2023-02-26 DIAGNOSIS — Z992 Dependence on renal dialysis: Secondary | ICD-10-CM | POA: Diagnosis not present

## 2023-02-26 DIAGNOSIS — D631 Anemia in chronic kidney disease: Secondary | ICD-10-CM | POA: Diagnosis not present

## 2023-02-26 DIAGNOSIS — R52 Pain, unspecified: Secondary | ICD-10-CM | POA: Diagnosis not present

## 2023-02-26 DIAGNOSIS — E1122 Type 2 diabetes mellitus with diabetic chronic kidney disease: Secondary | ICD-10-CM | POA: Diagnosis not present

## 2023-02-26 DIAGNOSIS — N186 End stage renal disease: Secondary | ICD-10-CM | POA: Diagnosis not present

## 2023-02-26 DIAGNOSIS — D689 Coagulation defect, unspecified: Secondary | ICD-10-CM | POA: Diagnosis not present

## 2023-02-27 ENCOUNTER — Ambulatory Visit (INDEPENDENT_AMBULATORY_CARE_PROVIDER_SITE_OTHER): Payer: 59 | Admitting: Physician Assistant

## 2023-02-27 ENCOUNTER — Other Ambulatory Visit: Payer: Self-pay | Admitting: *Deleted

## 2023-02-27 ENCOUNTER — Ambulatory Visit (HOSPITAL_COMMUNITY)
Admission: RE | Admit: 2023-02-27 | Discharge: 2023-02-27 | Disposition: A | Discharge status: Home / Self Care | Payer: 59 | Source: Ambulatory Visit | Attending: Vascular Surgery | Admitting: Vascular Surgery

## 2023-02-27 ENCOUNTER — Encounter: Payer: Self-pay | Admitting: *Deleted

## 2023-02-27 VITALS — BP 204/107 | HR 70 | Temp 98.7°F | Resp 20 | Ht 66.0 in | Wt 175.0 lb

## 2023-02-27 DIAGNOSIS — Z992 Dependence on renal dialysis: Secondary | ICD-10-CM

## 2023-02-27 DIAGNOSIS — N186 End stage renal disease: Secondary | ICD-10-CM

## 2023-02-27 DIAGNOSIS — T82590D Other mechanical complication of surgically created arteriovenous fistula, subsequent encounter: Secondary | ICD-10-CM

## 2023-02-27 NOTE — H&P (View-Only) (Signed)
 [facility] [place] HISTORY AND PHYSICAL   History of Present Illness:  Patient is a 53 y.o. year old male who presents for examination of the fistula.  He has a history of left BC AV fistula creation on 09/19/22 by Dr. Myra Gianotti.  The fistula was slow to mature.  He returned to the OR for Coil embolization, venous branch on 11/19/22 with Dr. Myra Gianotti.   Findings :  Multiple competing branches in the upper arm.  The largest was attempted to be coil embolized however I could not get the coil to stay packed into the branch and ultimately had to remove it.  Completion imaging showed that the largest competing branch had disappeared.  This could either be spasm or thrombosis.  Regardless, I would proceed with utilization of his fistula within approximately 2 weeks.  If he continues to have difficulty, he will need to be scheduled for branch ligation.    He has been referred back to our office secondary to HD issues and them noot being able to cannulate the fistula with 2 needles and have only been able to cannulate with the arterial needle and return through the catheter.   The studies were reviewed by Dr. Randie Heinz and there was no evidence of outflow issues.         Past Medical History:  Diagnosis Date   Allergies    Anemia    Blind left eye    CHF (congestive heart failure) (HCC)    Chronic kidney disease    ESRD - HD M,W,F   Diabetes mellitus without complication (HCC)    Type2   Dyspnea    Patient denies SOB since he started dialysis. States he went away after he started dialysis   HLD (hyperlipidemia)    Hypertension    Pneumonia    x2    Past Surgical History:  Procedure Laterality Date   A/V FISTULAGRAM Left 11/19/2022   Procedure: A/V Fistulagram;  Surgeon: Nada Libman, MD;  Location: [facility]AS[facility];  Service: Cardiovascular;  Laterality: Left;   AV FISTULA PLACEMENT Left 09/19/2022   Procedure: ARTERIOVENOUS (AV) FISTULA CREATION;  Surgeon: Nada Libman, MD;  Location: [facility] OR;  Service: Vascular;  Laterality: Left;   BIOPSY  12/05/2022   Procedure: BIOPSY;  Surgeon: Napoleon Form, MD;  Location: [facility] ENDOSCOPY;  Service: Gastroenterology;;   CATARACT EXTRACTION Right    COLONOSCOPY WITH PROPOFOL N/A 12/05/2022   Procedure: COLONOSCOPY WITH PROPOFOL;  Surgeon: Napoleon Form, MD;  Location: [facility]OS[facility];  Service: Gastroenterology;  Laterality: N/A;   ESOPHAGOGASTRODUODENOSCOPY (EGD) WITH PROPOFOL N/A 12/05/2022   Procedure: ESOPHAGOGASTRODUODENOSCOPY (EGD) WITH PROPOFOL;  Surgeon: Napoleon Form, MD;  Location: [facility] ENDOS[facility];  Service: Gastroenterology;  Laterality: N/A;   FRACTURE SURGERY Left    [place]   MENISCUS REPAIR Left    PERIPHERAL VASCULAR INTERVENTION  11/19/2022   Procedure: PERIPHERAL VASCULAR INTERVENTION;  Surgeon: Nada Libman, MD;  Location: [facility];  Service: Cardiovascular;;  Embolization coil   POLYPECTOMY  12/05/2022   Procedure: POLYPECTOMY;  Surgeon: Napoleon Form, MD;  Location: [facility] ENDOS[facility];  Service: Gastroenterology;;     Social History Social History   Tobacco Use   Smoking status: Never    Passive exposure: Never   Smokeless tobacco: Never  Vaping Use   Vaping status: Never Used  Substance Use Topics   Alcohol use: Not Currently   Drug use: Not Currently    Family History Family  History  Problem Relation Age of Onset   Hypertension Mother    Diabetes Mother    Heart attack Mother    Diabetes Brother     Allergies  No Known Allergies   Current Outpatient Medications  Medication Sig Dispense Refill   amLODipine (NORVASC) 10 MG tablet Take 10 mg by mouth daily.     carvedilol (COREG) 25 MG tablet Take 25 mg by mouth 2 (two) times daily.     Continuous Glucose Sensor (DEXCOM G7 SENSOR) MISC 1 Device by Does not apply route continuous. 9 each 1   FARXIGA 10 MG TABS tablet Take 10 mg by mouth daily.     furosemide (LASIX) 80 MG tablet Take 80 mg by  mouth every Tuesday, Thursday, Saturday, and Sunday.     hydrALAZINE (APRESOLINE) 100 MG tablet Take 50 mg by mouth 3 (three) times daily. MAY TAKE EXTRA 50MG  FOR SBP>160     insulin glargine (LANTUS SOLOSTAR) 100 UNIT/ML Solostar Pen INJECT 5 UINTS INTO THE SKIN DAILY (Patient taking differently: Inject 8 Units into the skin at bedtime.) 15 mL 1   Insulin Pen Needle (PEN NEEDLES) 31G X 5 MM MISC 1 each by Does not apply route daily. 100 each 1   isosorbide mononitrate (IMDUR) 30 MG 24 hr tablet Take 30 mg by mouth daily.     isosorbide mononitrate (IMDUR) 60 MG 24 hr tablet Take 60 mg by mouth daily.     labetalol (NORMODYNE) 200 MG tablet Take 400 mg by mouth 2 (two) times daily.     losartan (COZAAR) 25 MG tablet Take 25 mg by mouth daily.     Methoxy PEG-Epoetin Beta (MIRCERA IJ) Mircera     metolazone (ZAROXOLYN) 2.5 MG tablet Take 2.5 mg by mouth daily.     oxyCODONE (ROXICODONE) 5 MG immediate release tablet Take 1 tablet (5 mg total) by mouth every 4 (four) hours as needed for severe pain (pain score 7-10) or breakthrough pain. 10 tablet 0   sacubitril-valsartan (ENTRESTO) 97-103 MG Take 1 tablet by mouth 2 (two) times daily.     spironolactone (ALDACTONE) 25 MG tablet Take 25 mg by mouth daily.     torsemide (DEMADEX) 20 MG tablet Take 20 mg by mouth 2 (two) times daily.     aspirin EC 325 MG tablet Take 1 tablet (325 mg total) by mouth daily. (Patient not taking: Reported on 02/25/2023) 14 tablet 0   Continuous Glucose Sensor (FREESTYLE LIBRE 3 SENSOR) MISC 1 each by Does not apply route See admin instructions. Replace every 14 days (Patient not taking: Reported on 02/25/2023) 6 each 1   Doxercalciferol (HECTOROL IV) Doxercalciferol (Hectorol) (Patient not taking: Reported on 02/25/2023)     gentamicin cream (GARAMYCIN) 0.1 % Apply 1 Application topically daily. Apply to right leg once daily. (Patient not taking: Reported on 02/25/2023) 30 g 0   hydrOXYzine (VISTARIL) 25 MG capsule Take 25 mg  by mouth at bedtime. (Patient not taking: Reported on 02/25/2023)     levocetirizine (XYZAL) 5 MG tablet Take 5 mg by mouth daily. (Patient not taking: Reported on 02/25/2023)     metoprolol succinate (TOPROL-XL) 100 MG 24 hr tablet Take 1 tablet (100 mg total) by mouth daily. (Patient not taking: Reported on 02/25/2023) 30 tablet 3   pantoprazole (PROTONIX) 40 MG tablet Take 1 tablet (40 mg total) by mouth 2 (two) times daily before a meal. (Patient not taking: Reported on 02/25/2023) 180 tablet 3   No current facility-administered  medications for this visit.    ROS:   General:  No weight loss, Fever, chills  HEENT: No recent headaches, no nasal bleeding, no visual changes, no sore throat  Neurologic: No dizziness, blackouts, seizures. No recent symptoms of stroke or mini- stroke. No recent episodes of slurred speech, or temporary blindness.  Cardiac: No recent episodes of chest pain/pressure, no shortness of breath at rest.  No shortness of breath with exertion.  Denies history of atrial fibrillation or irregular heartbeat  Vascular: No history of rest pain in feet.  No history of claudication.  No history of non-healing ulcer, No history of DVT   Pulmonary: No home oxygen, no productive cough, no hemoptysis,  No asthma or wheezing  Musculoskeletal:  [ ]  Arthritis, [ ]  Low back pain,  [ ]  Joint pain  Hematologic:No history of hypercoagulable state.  No history of easy bleeding.  No history of anemia  Gastrointestinal: No hematochezia or melena,  No gastroesophageal reflux, no trouble swallowing  Urinary: [ ]  chronic Kidney disease, [ ]  on HD - [ ]  MWF or [ ]  TTHS, [ ]  Burning with urination, [ ]  Frequent urination, [ ]  Difficulty urinating;   Skin: No rashes  Psychological: No history of anxiety,  No history of depression   Physical Examination  Vitals:   02/27/23 0820  BP: (!) 204/107  Pulse: 70  Resp: 20  Temp: 98.7 F (37.1 C)  SpO2: 97%  Weight: 175 lb (79.4 kg)   Height: 5\' 6"  (1.676 m)    Body mass index is 28.25 kg/m.  General:  Alert and oriented, no acute distress HEENT: Normal Neck: No bruit or JVD Pulmonary: Clear to auscultation bilaterally Cardiac: Regular Rate and Rhythm without murmur Gastrointestinal: Soft, non-tender, non-distended, no mass, no scars Skin: No rash Extremity Pulses:  2+ radial, brachial pulses bilaterally Musculoskeletal: No deformity or edema  Neurologic: Upper and lower extremity motor 5/5 and symmetric  DATA:  Findings:  +--------------------+----------+-----------------+--------+  AVF                PSV (cm/s)Flow Vol (mL/min)Comments  +--------------------+----------+-----------------+--------+  Native artery inflow    32          1546                 +--------------------+----------+-----------------+--------+  AVF Anastomosis        716                               +--------------------+----------+-----------------+--------+     +------------+----------+-------------+-----------+------------+  OUTFLOW VEINPSV (cm/s)Diameter (cm)Depth (cm)   Describe    +------------+----------+-------------+-----------+------------+  Shoulder      169        0.70        0.36                  +------------+----------+-------------+-----------+------------+  Prox UA     596 / 287  0.51 / 0.65 0.24 / 0.80mid to upper  +------------+----------+-------------+-----------+------------+  Mid UA         249        0.53        0.25                  +------------+----------+-------------+-----------+------------+  Dist UA     176 / 600  0.66 / 0.48 0.19 / 0.26              +------------+----------+-------------+-----------+------------+  AC Fossa  457        0.80        0.22                  +------------+----------+-------------+-----------+------------+      Summary:    Left Brachial Cephalic AVF appears patent with increased velocity at the  distal outflow vein  (possible valve / narrowing) and mid to upper outflow  vein (possible soft thrombus anterior wall).   ASSESSMENT/PLAN: Difficult cannulation of left BC AV fistula with multiple branches.  He failed coil venous occlusion of the largest branch in the past.  Discussions and review of the fistulogram now lead [place] to surgical branch ligation in hopes of salvaging the fistula.  HD MWF [place].           Mosetta Pigeon PA-C [facility] [place] Office: [phone]  MD in clinic [place]

## 2023-02-27 NOTE — Progress Notes (Signed)
[facility] [place] HISTORY AND PHYSICAL   History of Present Illness:  Patient is a 53 y.o. year old male who presents for examination of the fistula.  He has a history of left BC AV fistula creation on 09/19/22 by Dr. Myra Gianotti.  The fistula was slow to mature.  He returned to the OR for Coil embolization, venous branch on 11/19/22 with Dr. Myra Gianotti.   Findings :  Multiple competing branches in the upper arm.  The largest was attempted to be coil embolized however I could not get the coil to stay packed into the branch and ultimately had to remove it.  Completion imaging showed that the largest competing branch had disappeared.  This could either be spasm or thrombosis.  Regardless, I would proceed with utilization of his fistula within approximately 2 weeks.  If he continues to have difficulty, he will need to be scheduled for branch ligation.    He has been referred back to our office secondary to HD issues and them noot being able to cannulate the fistula with 2 needles and have only been able to cannulate with the arterial needle and return through the catheter.   The studies were reviewed by Dr. Randie Heinz and there was no evidence of outflow issues.         Past Medical History:  Diagnosis Date   Allergies    Anemia    Blind left eye    CHF (congestive heart failure) (HCC)    Chronic kidney disease    ESRD - HD M,W,F   Diabetes mellitus without complication (HCC)    Type2   Dyspnea    Patient denies SOB since he started dialysis. States he went away after he started dialysis   HLD (hyperlipidemia)    Hypertension    Pneumonia    x2    Past Surgical History:  Procedure Laterality Date   A/V FISTULAGRAM Left 11/19/2022   Procedure: A/V Fistulagram;  Surgeon: Nada Libman, MD;  Location: [facility];  Service: Cardiovascular;  Laterality: Left;   AV FISTULA PLACEMENT Left 09/19/2022   Procedure: ARTERIOVENOUS (AV) FISTULA CREATION;  Surgeon: Nada Libman, MD;  Location: [facility] OR;  Service: Vascular;  Laterality: Left;   BIOPSY  12/05/2022   Procedure: BIOPSY;  Surgeon: Napoleon Form, MD;  Location: [facility];  Service: Gastroenterology;;   CATARACT EXTRACTION Right    COLONOSCOPY WITH PROPOFOL N/A 12/05/2022   Procedure: COLONOSCOPY WITH PROPOFOL;  Surgeon: Napoleon Form, MD;  Location: [facility];  Service: Gastroenterology;  Laterality: N/A;   ESOPHAGOGASTRODUODENOSCOPY (EGD) WITH PROPOFOL N/A 12/05/2022   Procedure: ESOPHAGOGASTRODUODENOSCOPY (EGD) WITH PROPOFOL;  Surgeon: Napoleon Form, MD;  Location: [facility];  Service: Gastroenterology;  Laterality: N/A;   FRACTURE SURGERY Left    [place]   MENISCUS REPAIR Left    PERIPHERAL VASCULAR INTERVENTION  11/19/2022   Procedure: PERIPHERAL VASCULAR INTERVENTION;  Surgeon: Nada Libman, MD;  Location: [facility];  Service: Cardiovascular;;  Embolization coil   POLYPECTOMY  12/05/2022   Procedure: POLYPECTOMY;  Surgeon: Napoleon Form, MD;  Location: [facility];  Service: Gastroenterology;;     Social History Social History   Tobacco Use   Smoking status: Never    Passive exposure: Never   Smokeless tobacco: Never  Vaping Use   Vaping status: Never Used  Substance Use Topics   Alcohol use: Not Currently   Drug use: Not Currently    Family History Family  History  Problem Relation Age of Onset   Hypertension Mother    Diabetes Mother    Heart attack Mother    Diabetes Brother     Allergies  No Known Allergies   Current Outpatient Medications  Medication Sig Dispense Refill   amLODipine (NORVASC) 10 MG tablet Take 10 mg by mouth daily.     carvedilol (COREG) 25 MG tablet Take 25 mg by mouth 2 (two) times daily.     Continuous Glucose Sensor (DEXCOM G7 SENSOR) MISC 1 Device by Does not apply route continuous. 9 each 1   FARXIGA 10 MG TABS tablet Take 10 mg by mouth daily.     furosemide (LASIX) 80 MG tablet Take 80 mg by  mouth every Tuesday, Thursday, Saturday, and Sunday.     hydrALAZINE (APRESOLINE) 100 MG tablet Take 50 mg by mouth 3 (three) times daily. MAY TAKE EXTRA 50MG  FOR SBP>160     insulin glargine (LANTUS SOLOSTAR) 100 UNIT/ML Solostar Pen INJECT 5 UINTS INTO THE SKIN DAILY (Patient taking differently: Inject 8 Units into the skin at bedtime.) 15 mL 1   Insulin Pen Needle (PEN NEEDLES) 31G X 5 MM MISC 1 each by Does not apply route daily. 100 each 1   isosorbide mononitrate (IMDUR) 30 MG 24 hr tablet Take 30 mg by mouth daily.     isosorbide mononitrate (IMDUR) 60 MG 24 hr tablet Take 60 mg by mouth daily.     labetalol (NORMODYNE) 200 MG tablet Take 400 mg by mouth 2 (two) times daily.     losartan (COZAAR) 25 MG tablet Take 25 mg by mouth daily.     Methoxy PEG-Epoetin Beta (MIRCERA IJ) Mircera     metolazone (ZAROXOLYN) 2.5 MG tablet Take 2.5 mg by mouth daily.     oxyCODONE (ROXICODONE) 5 MG immediate release tablet Take 1 tablet (5 mg total) by mouth every 4 (four) hours as needed for severe pain (pain score 7-10) or breakthrough pain. 10 tablet 0   sacubitril-valsartan (ENTRESTO) 97-103 MG Take 1 tablet by mouth 2 (two) times daily.     spironolactone (ALDACTONE) 25 MG tablet Take 25 mg by mouth daily.     torsemide (DEMADEX) 20 MG tablet Take 20 mg by mouth 2 (two) times daily.     aspirin EC 325 MG tablet Take 1 tablet (325 mg total) by mouth daily. (Patient not taking: Reported on 02/25/2023) 14 tablet 0   Continuous Glucose Sensor (FREESTYLE LIBRE 3 SENSOR) MISC 1 each by Does not apply route See admin instructions. Replace every 14 days (Patient not taking: Reported on 02/25/2023) 6 each 1   Doxercalciferol (HECTOROL IV) Doxercalciferol (Hectorol) (Patient not taking: Reported on 02/25/2023)     gentamicin cream (GARAMYCIN) 0.1 % Apply 1 Application topically daily. Apply to right leg once daily. (Patient not taking: Reported on 02/25/2023) 30 g 0   hydrOXYzine (VISTARIL) 25 MG capsule Take 25 mg  by mouth at bedtime. (Patient not taking: Reported on 02/25/2023)     levocetirizine (XYZAL) 5 MG tablet Take 5 mg by mouth daily. (Patient not taking: Reported on 02/25/2023)     metoprolol succinate (TOPROL-XL) 100 MG 24 hr tablet Take 1 tablet (100 mg total) by mouth daily. (Patient not taking: Reported on 02/25/2023) 30 tablet 3   pantoprazole (PROTONIX) 40 MG tablet Take 1 tablet (40 mg total) by mouth 2 (two) times daily before a meal. (Patient not taking: Reported on 02/25/2023) 180 tablet 3   No current facility-administered  medications for this visit.    ROS:   General:  No weight loss, Fever, chills  HEENT: No recent headaches, no nasal bleeding, no visual changes, no sore throat  Neurologic: No dizziness, blackouts, seizures. No recent symptoms of stroke or mini- stroke. No recent episodes of slurred speech, or temporary blindness.  Cardiac: No recent episodes of chest pain/pressure, no shortness of breath at rest.  No shortness of breath with exertion.  Denies history of atrial fibrillation or irregular heartbeat  Vascular: No history of rest pain in feet.  No history of claudication.  No history of non-healing ulcer, No history of DVT   Pulmonary: No home oxygen, no productive cough, no hemoptysis,  No asthma or wheezing  Musculoskeletal:  [ ]  Arthritis, [ ]  Low back pain,  [ ]  Joint pain  Hematologic:No history of hypercoagulable state.  No history of easy bleeding.  No history of anemia  Gastrointestinal: No hematochezia or melena,  No gastroesophageal reflux, no trouble swallowing  Urinary: [ ]  chronic Kidney disease, [ ]  on HD - [ ]  MWF or [ ]  TTHS, [ ]  Burning with urination, [ ]  Frequent urination, [ ]  Difficulty urinating;   Skin: No rashes  Psychological: No history of anxiety,  No history of depression   Physical Examination  Vitals:   02/27/23 0820  BP: (!) 204/107  Pulse: 70  Resp: 20  Temp: 98.7 F (37.1 C)  SpO2: 97%  Weight: 175 lb (79.4 kg)   Height: 5\' 6"  (1.676 m)    Body mass index is 28.25 kg/m.  General:  Alert and oriented, no acute distress HEENT: Normal Neck: No bruit or JVD Pulmonary: Clear to auscultation bilaterally Cardiac: Regular Rate and Rhythm without murmur Gastrointestinal: Soft, non-tender, non-distended, no mass, no scars Skin: No rash Extremity Pulses:  2+ radial, brachial pulses bilaterally Musculoskeletal: No deformity or edema  Neurologic: Upper and lower extremity motor 5/5 and symmetric  DATA:  Findings:  +--------------------+----------+-----------------+--------+  AVF                PSV (cm/s)Flow Vol (mL/min)Comments  +--------------------+----------+-----------------+--------+  Native artery inflow    32          1546                 +--------------------+----------+-----------------+--------+  AVF Anastomosis        716                               +--------------------+----------+-----------------+--------+     +------------+----------+-------------+-----------+------------+  OUTFLOW VEINPSV (cm/s)Diameter (cm)Depth (cm)   Describe    +------------+----------+-------------+-----------+------------+  Shoulder      169        0.70        0.36                  +------------+----------+-------------+-----------+------------+  Prox UA     596 / 287  0.51 / 0.65 0.24 / 0.80mid to upper  +------------+----------+-------------+-----------+------------+  Mid UA         249        0.53        0.25                  +------------+----------+-------------+-----------+------------+  Dist UA     176 / 600  0.66 / 0.48 0.19 / 0.26              +------------+----------+-------------+-----------+------------+  AC Fossa  457        0.80        0.22                  +------------+----------+-------------+-----------+------------+      Summary:    Left Brachial Cephalic AVF appears patent with increased velocity at the  distal outflow vein  (possible valve / narrowing) and mid to upper outflow  vein (possible soft thrombus anterior wall).   ASSESSMENT/PLAN: Difficult cannulation of left BC AV fistula with multiple branches.  He failed coil venous occlusion of the largest branch in the past.  Discussions and review of the fistulogram now lead [place] to surgical branch ligation in hopes of salvaging the fistula.  HD MWF [place] [facility].           Mosetta Pigeon PA-C [facility] [place] Office: [phone]  MD in clinic [place]

## 2023-02-28 DIAGNOSIS — D631 Anemia in chronic kidney disease: Secondary | ICD-10-CM | POA: Diagnosis not present

## 2023-02-28 DIAGNOSIS — E1122 Type 2 diabetes mellitus with diabetic chronic kidney disease: Secondary | ICD-10-CM | POA: Diagnosis not present

## 2023-02-28 DIAGNOSIS — R52 Pain, unspecified: Secondary | ICD-10-CM | POA: Diagnosis not present

## 2023-02-28 DIAGNOSIS — N2581 Secondary hyperparathyroidism of renal origin: Secondary | ICD-10-CM | POA: Diagnosis not present

## 2023-02-28 DIAGNOSIS — Z992 Dependence on renal dialysis: Secondary | ICD-10-CM | POA: Diagnosis not present

## 2023-02-28 DIAGNOSIS — D689 Coagulation defect, unspecified: Secondary | ICD-10-CM | POA: Diagnosis not present

## 2023-02-28 DIAGNOSIS — N186 End stage renal disease: Secondary | ICD-10-CM | POA: Diagnosis not present

## 2023-02-28 NOTE — Progress Notes (Signed)
Anesthesia Chart Review:  53 year old male follows with cardiology for history of HFpEF and resistant HTN. Echocardiogram 09/09/2022 showed LVEF of 60-65%, G2 DD, mildly elevated pulmonary artery pressure, moderately dilated right and left atria and borderline ascending aortic dilation measuring 38 mm.  Last seen by Edd Fabian, NP on 12/13/2022.  Stable at that time.  Continued on antihypertensive regimen of Entresto, amlodipine, furosemide, hydralazine, metoprolol, Imdur.  He was recommended to take extra 50 mg of hydralazine as needed for systolic blood pressure greater than 160.  Other pertinent history includes IDDM 2, ESRD on HD Monday Wednesday Friday.  Per last vascular surgery follow-up note 02/26/2023, dialysis center has not been able to cannulate LUE fistula with 2 needles and has only been able to cannulate with the arterial needle and return to the catheter.  Recent stress echo 11/18/2022 as part of transplant evaluation at [facility] showed normal resting study with no wall motion abnormalities at rest and at subtarget heart rate of 102 bpm.  Maximum workload of 7 METS was achieved during exercise, ECG result nondiagnostic due to inadequate heart rate.  CBC 02/25/2023 reviewed, mild chronic anemia hemoglobin 10.9, otherwise unremarkable.  EKG 08/12/2022: NSR.  Rate 83.  Minimal voltage criteria for LVH, may be normal variant.  T wave abnormality, consider inferior ischemia.  No significant change.  Stress echo 11/18/2022 (Care Everywhere): INTERPRETATION ---------------------------------------------------------------    INDETERMINATE.   ELEVATED LA PRESSURES WITH DIASTOLIC DYSFUNCTION   VALVULAR REGURGITATION: TRIVIAL AR, TRIVIAL MR, TRIVIAL PR, TRIVIAL TR   NO VALVULAR STENOSIS   Note: NORMAL RESTING STUDY WITH NO WALL MOTION ABNORMALITIES AT REST AND   AT A SUBTARGET HEART RATE OF 102 BPM (TARGET HEART RATE = 143 BPM)   Maximum workload of  7.00 METs was achieved during exercise.    RESTING HYPERTENSION - EXAGGERATED RESPONSE   NO PRIOR STUDY FOR COMPARISON   TTE 09/09/2022: 1. Left ventricular ejection fraction, by estimation, is 60 to 65%. Left  ventricular ejection fraction by 3D volume is 65 %. The left ventricle has  normal function. The left ventricle has no regional wall motion  abnormalities. There is mild left  ventricular hypertrophy. Left ventricular diastolic parameters are  consistent with Grade II diastolic dysfunction (pseudonormalization).   2. Right ventricular systolic function is normal. The right ventricular  size is normal. There is mildly elevated pulmonary artery systolic  pressure. The estimated right ventricular systolic pressure is 41.9 mmHg.   3. Left atrial size was moderately dilated.   4. Right atrial size was moderately dilated.   5. The mitral valve is normal in structure. No evidence of mitral valve  regurgitation. No evidence of mitral stenosis.   6. The aortic valve is tricuspid. Aortic valve regurgitation is not  visualized. No aortic stenosis is present.   7. Aortic dilatation noted. There is borderline dilatation of the  ascending aorta, measuring 38 mm.   8. The inferior vena cava is normal in size with greater than 50%  respiratory variability, suggesting right atrial pressure of 3 mmHg.   Comparison(s): Changes from prior study are noted. 06/12/2020: LVEF 60-65%.     Zannie Cove [facility] Short Stay Center/Anesthesiology Phone [phone] 02/28/2023 11:57 AM

## 2023-02-28 NOTE — Anesthesia Preprocedure Evaluation (Signed)
Anesthesia Evaluation  Patient identified by MRN, date of birth, ID band Patient awake    Reviewed: Allergy & Precautions, H&P , NPO status , Patient's Chart, lab work & pertinent test results, reviewed documented beta blocker date and time   Airway Mallampati: III  TM Distance: >3 FB     Dental  (+) Poor Dentition, Dental Advisory Given, Edentulous Upper, Missing,    Pulmonary shortness of breath and with exertion, pneumonia, resolved   Pulmonary exam normal breath sounds clear to auscultation       Cardiovascular hypertension, Pt. on medications and Pt. on home beta blockers +CHF (normal LVEF, grade 2 diastolic dysfunction)  Normal cardiovascular exam Rhythm:Regular Rate:Normal  Echo 2024  1. Left ventricular ejection fraction, by estimation, is 60 to 65%. Left  ventricular ejection fraction by 3D volume is 65 %. The left ventricle has  normal function. The left ventricle has no regional wall motion  abnormalities. There is mild left  ventricular hypertrophy. Left ventricular diastolic parameters are  consistent with Grade II diastolic dysfunction (pseudonormalization).   2. Right ventricular systolic function is normal. The right ventricular  size is normal. There is mildly elevated pulmonary artery systolic  pressure. The estimated right ventricular systolic pressure is 41.9 mmHg.   3. Left atrial size was moderately dilated.   4. Right atrial size was moderately dilated.   5. The mitral valve is normal in structure. No evidence of mitral valve  regurgitation. No evidence of mitral stenosis.   6. The aortic valve is tricuspid. Aortic valve regurgitation is not  visualized. No aortic stenosis is present.   7. Aortic dilatation noted. There is borderline dilatation of the  ascending aorta, measuring 38 mm.   8. The inferior vena cava is normal in size with greater than 50%  respiratory variability, suggesting right atrial  pressure of 3 mmHg.   Recent stress echo 11/18/2022 as part of transplant evaluation at [facility] showed normal resting study with no wall motion abnormalities at rest and at subtarget heart rate of 102 bpm.  Maximum workload of 7 METS was achieved during exercise, ECG result nondiagnostic due to inadequate heart rate.    Neuro/Psych negative neurological ROS  negative psych ROS   GI/Hepatic negative GI ROS, Neg liver ROS,,,  Endo/Other  diabetes, Well Controlled, Type 2, Insulin Dependent  Hyperlipidemia  Renal/GU ESRF and DialysisRenal disease (MWF)HD M-W-F  negative genitourinary   Musculoskeletal Right patellar tendon tear   Abdominal   Peds negative pediatric ROS (+)  Hematology  (+) Blood dyscrasia, anemia Hb 10.9, plt 194   Anesthesia Other Findings   Reproductive/Obstetrics negative OB ROS                             Anesthesia Physical Anesthesia Plan  ASA: 4  Anesthesia Plan: General   Post-op Pain Management: Tylenol PO (pre-op)*, Precedex and Dilaudid IV   Induction: Intravenous  PONV Risk Score and Plan: 2 and Ondansetron, Dexamethasone, Midazolam and Treatment may vary due to age or medical condition  Airway Management Planned: LMA  Additional Equipment: None  Intra-op Plan:   Post-operative Plan: Extubation in OR  Informed Consent: I have reviewed the patients History and Physical, chart, labs and discussed the procedure including the risks, benefits and alternatives for the proposed anesthesia with the patient or authorized representative who has indicated his/her understanding and acceptance.     Dental advisory given  Plan Discussed with: Anesthesiologist and CRNA  Anesthesia Plan Comments: (  )        Anesthesia Quick Evaluation

## 2023-02-28 NOTE — Progress Notes (Signed)
Patient voiced understanding of new arrival time of 1230 on monday, and clear liquids okay until 1130.

## 2023-03-03 ENCOUNTER — Ambulatory Visit (HOSPITAL_COMMUNITY): Payer: 59 | Admitting: Anesthesiology

## 2023-03-03 ENCOUNTER — Ambulatory Visit (HOSPITAL_COMMUNITY)
Admission: RE | Admit: 2023-03-03 | Discharge: 2023-03-03 | Disposition: A | Discharge status: Home / Self Care | Payer: 59 | Attending: Orthopaedic Surgery | Admitting: Orthopaedic Surgery

## 2023-03-03 ENCOUNTER — Other Ambulatory Visit: Payer: Self-pay

## 2023-03-03 ENCOUNTER — Encounter (HOSPITAL_COMMUNITY): Payer: Self-pay | Admitting: Orthopaedic Surgery

## 2023-03-03 ENCOUNTER — Ambulatory Visit (HOSPITAL_COMMUNITY): Payer: 59 | Admitting: Physician Assistant

## 2023-03-03 ENCOUNTER — Encounter (HOSPITAL_COMMUNITY)
Admission: RE | Disposition: A | Discharge status: Home / Self Care | Payer: Self-pay | Source: Home / Self Care | Attending: Orthopaedic Surgery

## 2023-03-03 DIAGNOSIS — Z794 Long term (current) use of insulin: Secondary | ICD-10-CM | POA: Diagnosis not present

## 2023-03-03 DIAGNOSIS — E1122 Type 2 diabetes mellitus with diabetic chronic kidney disease: Secondary | ICD-10-CM | POA: Diagnosis not present

## 2023-03-03 DIAGNOSIS — Z992 Dependence on renal dialysis: Secondary | ICD-10-CM | POA: Diagnosis not present

## 2023-03-03 DIAGNOSIS — S86811A Strain of other muscle(s) and tendon(s) at lower leg level, right leg, initial encounter: Secondary | ICD-10-CM

## 2023-03-03 DIAGNOSIS — E119 Type 2 diabetes mellitus without complications: Secondary | ICD-10-CM

## 2023-03-03 DIAGNOSIS — R52 Pain, unspecified: Secondary | ICD-10-CM | POA: Diagnosis not present

## 2023-03-03 DIAGNOSIS — J189 Pneumonia, unspecified organism: Secondary | ICD-10-CM

## 2023-03-03 DIAGNOSIS — I132 Hypertensive heart and chronic kidney disease with heart failure and with stage 5 chronic kidney disease, or end stage renal disease: Secondary | ICD-10-CM | POA: Diagnosis not present

## 2023-03-03 DIAGNOSIS — N186 End stage renal disease: Secondary | ICD-10-CM | POA: Diagnosis not present

## 2023-03-03 DIAGNOSIS — D689 Coagulation defect, unspecified: Secondary | ICD-10-CM | POA: Diagnosis not present

## 2023-03-03 DIAGNOSIS — D631 Anemia in chronic kidney disease: Secondary | ICD-10-CM | POA: Diagnosis not present

## 2023-03-03 DIAGNOSIS — G8918 Other acute postprocedural pain: Secondary | ICD-10-CM | POA: Diagnosis not present

## 2023-03-03 DIAGNOSIS — N2581 Secondary hyperparathyroidism of renal origin: Secondary | ICD-10-CM | POA: Diagnosis not present

## 2023-03-03 DIAGNOSIS — X500XXD Overexertion from strenuous movement or load, subsequent encounter: Secondary | ICD-10-CM | POA: Insufficient documentation

## 2023-03-03 DIAGNOSIS — I110 Hypertensive heart disease with heart failure: Secondary | ICD-10-CM | POA: Diagnosis not present

## 2023-03-03 DIAGNOSIS — I509 Heart failure, unspecified: Secondary | ICD-10-CM

## 2023-03-03 DIAGNOSIS — S76111D Strain of right quadriceps muscle, fascia and tendon, subsequent encounter: Secondary | ICD-10-CM | POA: Diagnosis not present

## 2023-03-03 DIAGNOSIS — I5032 Chronic diastolic (congestive) heart failure: Secondary | ICD-10-CM | POA: Diagnosis not present

## 2023-03-03 DIAGNOSIS — S76111A Strain of right quadriceps muscle, fascia and tendon, initial encounter: Secondary | ICD-10-CM | POA: Diagnosis present

## 2023-03-03 HISTORY — PX: KNEE RECONSTRUCTION: SHX5883

## 2023-03-03 LAB — POCT I-STAT, CHEM 8
BUN: 29 mg/dL — ABNORMAL HIGH (ref 6–20)
Calcium, Ion: 1.08 mmol/L — ABNORMAL LOW (ref 1.15–1.40)
Chloride: 100 mmol/L (ref 98–111)
Creatinine, Ser: 8.9 mg/dL — ABNORMAL HIGH (ref 0.61–1.24)
Glucose, Bld: 201 mg/dL — ABNORMAL HIGH (ref 70–99)
HCT: 32 % — ABNORMAL LOW (ref 39.0–52.0)
Hemoglobin: 10.9 g/dL — ABNORMAL LOW (ref 13.0–17.0)
Potassium: 3.4 mmol/L — ABNORMAL LOW (ref 3.5–5.1)
Sodium: 136 mmol/L (ref 135–145)
TCO2: 25 mmol/L (ref 22–32)

## 2023-03-03 LAB — GLUCOSE, CAPILLARY
Glucose-Capillary: 186 mg/dL — ABNORMAL HIGH (ref 70–99)
Glucose-Capillary: 135 mg/dL — ABNORMAL HIGH (ref 70–99)
Glucose-Capillary: 100 mg/dL — ABNORMAL HIGH (ref 70–99)
Glucose-Capillary: 101 mg/dL — ABNORMAL HIGH (ref 70–99)

## 2023-03-03 SURGERY — RECONSTRUCTION, KNEE
Anesthesia: General | Site: Knee | Laterality: Right

## 2023-03-03 MED ORDER — MIDAZOLAM HCL 2 MG/2ML IJ SOLN
INTRAMUSCULAR | Status: AC
  Administered 2023-03-03: 2 mg via INTRAVENOUS
  Filled 2023-03-03: qty 2

## 2023-03-03 MED ORDER — 0.9 % SODIUM CHLORIDE (POUR BTL) OPTIME
TOPICAL | Status: DC | PRN
  Administered 2023-03-03: 2000 mL

## 2023-03-03 MED ORDER — BUPIVACAINE HCL (PF) 0.5 % IJ SOLN
INTRAMUSCULAR | Status: AC
  Filled 2023-03-03: qty 30

## 2023-03-03 MED ORDER — TRANEXAMIC ACID-NACL 1000-0.7 MG/100ML-% IV SOLN
1000.0000 mg | INTRAVENOUS | Status: AC
  Administered 2023-03-03: 1000 mg via INTRAVENOUS

## 2023-03-03 MED ORDER — VANCOMYCIN HCL 1000 MG IV SOLR
INTRAVENOUS | Status: AC
  Filled 2023-03-03: qty 20

## 2023-03-03 MED ORDER — BACITRACIN ZINC 500 UNIT/GM EX OINT
TOPICAL_OINTMENT | CUTANEOUS | Status: AC
  Filled 2023-03-03: qty 28.35

## 2023-03-03 MED ORDER — OXYCODONE HCL 5 MG PO TABS
5.0000 mg | ORAL_TABLET | Freq: Once | ORAL | Status: DC | PRN
Start: 2023-03-03 — End: 2023-03-03

## 2023-03-03 MED ORDER — FENTANYL CITRATE (PF) 100 MCG/2ML IJ SOLN
INTRAMUSCULAR | Status: AC
  Administered 2023-03-03: 100 ug via INTRAVENOUS
  Filled 2023-03-03: qty 2

## 2023-03-03 MED ORDER — ORAL CARE MOUTH RINSE: 15.0000 mL | Freq: Once | OROMUCOSAL | Status: AC

## 2023-03-03 MED ORDER — GABAPENTIN 300 MG PO CAPS
ORAL_CAPSULE | ORAL | Status: AC
  Administered 2023-03-03: 300 mg via ORAL
  Filled 2023-03-03: qty 1

## 2023-03-03 MED ORDER — FENTANYL CITRATE (PF) 100 MCG/2ML IJ SOLN: 100.0000 ug | Freq: Once | INTRAMUSCULAR | Status: AC

## 2023-03-03 MED ORDER — CEFAZOLIN SODIUM-DEXTROSE 2-4 GM/100ML-% IV SOLN
2.0000 g | INTRAVENOUS | Status: AC
  Administered 2023-03-03: 2 g via INTRAVENOUS

## 2023-03-03 MED ORDER — PHENYLEPHRINE HCL-NACL 20-0.9 MG/250ML-% IV SOLN
INTRAVENOUS | Status: DC | PRN
  Administered 2023-03-03: 50 ug/min via INTRAVENOUS

## 2023-03-03 MED ORDER — TRANEXAMIC ACID-NACL 1000-0.7 MG/100ML-% IV SOLN
INTRAVENOUS | Status: AC
  Filled 2023-03-03: qty 100

## 2023-03-03 MED ORDER — FENTANYL CITRATE (PF) 250 MCG/5ML IJ SOLN
INTRAMUSCULAR | Status: DC | PRN
Start: 2023-03-03 — End: 2023-03-03
  Administered 2023-03-03: 25 ug via INTRAVENOUS

## 2023-03-03 MED ORDER — SODIUM CHLORIDE 0.9 % IV SOLN: INTRAVENOUS | Status: DC

## 2023-03-03 MED ORDER — ACETAMINOPHEN 500 MG PO TABS: 1000.0000 mg | ORAL_TABLET | Freq: Once | ORAL | Status: DC

## 2023-03-03 MED ORDER — HYDROMORPHONE HCL 1 MG/ML IJ SOLN: 0.2500 mg | INTRAMUSCULAR | Status: DC | PRN

## 2023-03-03 MED ORDER — ONDANSETRON HCL 4 MG/2ML IJ SOLN
4.0000 mg | Freq: Once | INTRAMUSCULAR | Status: DC | PRN
Start: 2023-03-03 — End: 2023-03-03

## 2023-03-03 MED ORDER — OXYCODONE HCL 5 MG/5ML PO SOLN: 5.0000 mg | Freq: Once | ORAL | Status: DC | PRN

## 2023-03-03 MED ORDER — LIDOCAINE HCL (CARDIAC) PF 100 MG/5ML IV SOSY
PREFILLED_SYRINGE | INTRAVENOUS | Status: DC | PRN
  Administered 2023-03-03: 40 mg via INTRATRACHEAL

## 2023-03-03 MED ORDER — CHLORHEXIDINE GLUCONATE 0.12 % MT SOLN
OROMUCOSAL | Status: AC
  Administered 2023-03-03: 15 mL via OROMUCOSAL
  Filled 2023-03-03: qty 15

## 2023-03-03 MED ORDER — MIDAZOLAM HCL 2 MG/2ML IJ SOLN: 2.0000 mg | Freq: Once | INTRAMUSCULAR | Status: AC

## 2023-03-03 MED ORDER — GABAPENTIN 300 MG PO CAPS: 300.0000 mg | ORAL_CAPSULE | Freq: Once | ORAL | Status: AC

## 2023-03-03 MED ORDER — ROPIVACAINE HCL 5 MG/ML IJ SOLN
INTRAMUSCULAR | Status: DC | PRN
  Administered 2023-03-03: 30 mL via PERINEURAL

## 2023-03-03 MED ORDER — INSULIN ASPART 100 UNIT/ML IJ SOLN
0.0000 [IU] | INTRAMUSCULAR | Status: DC | PRN
  Administered 2023-03-03: 2 [IU] via SUBCUTANEOUS

## 2023-03-03 MED ORDER — CEFAZOLIN SODIUM-DEXTROSE 2-4 GM/100ML-% IV SOLN
INTRAVENOUS | Status: AC
  Filled 2023-03-03: qty 100

## 2023-03-03 MED ORDER — ACETAMINOPHEN 500 MG PO TABS: 1000.0000 mg | ORAL_TABLET | Freq: Once | ORAL | Status: AC

## 2023-03-03 MED ORDER — VANCOMYCIN HCL 1000 MG IV SOLR
INTRAVENOUS | Status: DC | PRN
  Administered 2023-03-03: 1000 mg

## 2023-03-03 MED ORDER — DEXAMETHASONE SODIUM PHOSPHATE 10 MG/ML IJ SOLN
INTRAMUSCULAR | Status: DC | PRN
  Administered 2023-03-03: 10 mg

## 2023-03-03 MED ORDER — ONDANSETRON HCL 4 MG/2ML IJ SOLN
INTRAMUSCULAR | Status: DC | PRN
  Administered 2023-03-03: 4 mg via INTRAVENOUS

## 2023-03-03 MED ORDER — PROPOFOL 10 MG/ML IV BOLUS
INTRAVENOUS | Status: DC | PRN
  Administered 2023-03-03: 140 mg via INTRAVENOUS

## 2023-03-03 MED ORDER — CHLORHEXIDINE GLUCONATE 0.12 % MT SOLN: 15.0000 mL | Freq: Once | OROMUCOSAL | Status: AC

## 2023-03-03 MED ORDER — PHENYLEPHRINE HCL (PRESSORS) 10 MG/ML IV SOLN
INTRAVENOUS | Status: DC | PRN
  Administered 2023-03-03 (×3): 80 ug via INTRAVENOUS

## 2023-03-03 MED ORDER — FENTANYL CITRATE (PF) 250 MCG/5ML IJ SOLN
INTRAMUSCULAR | Status: AC
  Filled 2023-03-03: qty 5

## 2023-03-03 MED ORDER — INSULIN ASPART 100 UNIT/ML IJ SOLN
INTRAMUSCULAR | Status: AC
  Filled 2023-03-03: qty 1

## 2023-03-03 MED ORDER — ACETAMINOPHEN 500 MG PO TABS
ORAL_TABLET | ORAL | Status: AC
  Administered 2023-03-03: 1000 mg via ORAL
  Filled 2023-03-03: qty 2

## 2023-03-03 SURGICAL SUPPLY — 67 items
ANCH FIBERTAK W/FT 50 SP (Anchor) ×2 IMPLANT
BAG COUNTER SPONGE SURGICOUNT (BAG) ×2 IMPLANT
BANDAGE ESMARK 6X9 LF (GAUZE/BANDAGES/DRESSINGS) IMPLANT
BLADE CLIPPER SURG (BLADE) ×1 IMPLANT
BNDG COHESIVE 6X5 TAN ST LF (GAUZE/BANDAGES/DRESSINGS) ×1 IMPLANT
BNDG ELASTIC 4X5.8 VLCR STR LF (GAUZE/BANDAGES/DRESSINGS) ×1 IMPLANT
BNDG ELASTIC 6INX 5YD STR LF (GAUZE/BANDAGES/DRESSINGS) ×1 IMPLANT
BNDG ELASTIC 6X5.8 VLCR STR LF (GAUZE/BANDAGES/DRESSINGS) ×1 IMPLANT
BNDG ESMARK 6X9 LF (GAUZE/BANDAGES/DRESSINGS)
CHLORAPREP W/TINT 26 (MISCELLANEOUS) ×3 IMPLANT
COOLER ICEMAN CLASSIC (MISCELLANEOUS) ×1 IMPLANT
COVER SURGICAL LIGHT HANDLE (MISCELLANEOUS) ×2 IMPLANT
CUFF TOURN SGL QUICK 42 (TOURNIQUET CUFF) IMPLANT
CUFF TRNQT CYL 34X4.125X (TOURNIQUET CUFF) ×1 IMPLANT
DERMABOND ADVANCED .7 DNX12 (GAUZE/BANDAGES/DRESSINGS) ×1 IMPLANT
DRAPE C-ARM 42X72 X-RAY (DRAPES) ×2 IMPLANT
DRAPE C-ARMOR (DRAPES) ×2 IMPLANT
DRAPE HALF SHEET 40X57 (DRAPES) ×2 IMPLANT
DRAPE IMP U-DRAPE 54X76 (DRAPES) ×2 IMPLANT
DRAPE SURG ORHT 6 SPLT 77X108 (DRAPES) ×4 IMPLANT
DRAPE U-SHAPE 47X51 STRL (DRAPES) ×2 IMPLANT
ELECT REM PT RETURN 9FT ADLT (ELECTROSURGICAL) ×2
ELECTRODE REM PT RTRN 9FT ADLT (ELECTROSURGICAL) ×1 IMPLANT
FIBER TAPE 2MM (SUTURE) ×1 IMPLANT
GAUZE PAD ABD 8X10 STRL (GAUZE/BANDAGES/DRESSINGS) ×2 IMPLANT
GAUZE SPONGE 4X4 12PLY STRL (GAUZE/BANDAGES/DRESSINGS) ×1 IMPLANT
GAUZE XEROFORM 1X8 LF (GAUZE/BANDAGES/DRESSINGS) ×1 IMPLANT
GLOVE BIO SURGEON STRL SZ 6.5 (GLOVE) ×6 IMPLANT
GLOVE BIO SURGEON STRL SZ7.5 (GLOVE) ×8 IMPLANT
GLOVE BIOGEL PI IND STRL 6.5 (GLOVE) ×2 IMPLANT
GLOVE BIOGEL PI IND STRL 7.5 (GLOVE) ×2 IMPLANT
GOWN STRL REUS W/ TWL LRG LVL3 (GOWN DISPOSABLE) ×4 IMPLANT
GRAFT ACHILLES CALC BNE BLCK (Bone Implant) IMPLANT
GRAFT ACHILLES TENDON (Bone Implant) ×2 IMPLANT
IMMOBILIZER KNEE 22 UNIV (SOFTGOODS) ×2 IMPLANT
IMP SYS 2ND FIX PEEK 4.75X19.1 (Miscellaneous) ×4 IMPLANT
IMPL SYS 2ND FX PEEK 4.75X19.1 (Miscellaneous) IMPLANT
KIT BASIN OR (CUSTOM PROCEDURE TRAY) ×2 IMPLANT
KIT KNEE FIBERTAK DISP (KITS) ×1 IMPLANT
KIT TURNOVER KIT B (KITS) ×2 IMPLANT
MANIFOLD NEPTUNE II (INSTRUMENTS) ×2 IMPLANT
NDL 22X1.5 STRL (OR ONLY) (MISCELLANEOUS) IMPLANT
NDL TAPERED W/ NITINOL LOOP (MISCELLANEOUS) IMPLANT
NEEDLE 22X1.5 STRL (OR ONLY) (MISCELLANEOUS)
NEEDLE TAPERED W/ NITINOL LOOP (MISCELLANEOUS) ×2
NS IRRIG 1000ML POUR BTL (IV SOLUTION) ×3 IMPLANT
PACK GENERAL/GYN (CUSTOM PROCEDURE TRAY) ×2 IMPLANT
PAD ARMBOARD 7.5X6 YLW CONV (MISCELLANEOUS) ×4 IMPLANT
PADDING CAST COTTON 6X4 STRL (CAST SUPPLIES) ×1 IMPLANT
RETRIEVER SUT HEWSON (MISCELLANEOUS) IMPLANT
SLEEVE SCD COMPRESS KNEE MED (STOCKING) ×1 IMPLANT
SPIKE FLUID TRANSFER (MISCELLANEOUS) IMPLANT
SUT 2 FIBERLOOP 20 STRT BLUE (SUTURE) ×2
SUT ETHILON 3 0 PS 1 (SUTURE) ×2 IMPLANT
SUT FIBERWIRE #2 38 T-5 BLUE (SUTURE) ×2
SUT FIBERWIRE #5 38 CONV NDL (SUTURE)
SUT MNCRL AB 3-0 PS2 18 (SUTURE) ×1 IMPLANT
SUT VIC AB 0 CT1 27XBRD ANBCTR (SUTURE) ×3 IMPLANT
SUT VIC AB 1 CT1 27XBRD ANBCTR (SUTURE) ×1 IMPLANT
SUT VIC AB 2-0 CT1 TAPERPNT 27 (SUTURE) ×3 IMPLANT
SUTURE 2 FIBERLOOP 20 STRT BLU (SUTURE) IMPLANT
SUTURE FIBERWR #2 38 T-5 BLUE (SUTURE) IMPLANT
SUTURE FIBERWR #5 38 CONV NDL (SUTURE) IMPLANT
SYR CONTROL 10ML LL (SYRINGE) IMPLANT
TOWEL GREEN STERILE (TOWEL DISPOSABLE) ×2 IMPLANT
UNDERPAD 30X36 HEAVY ABSORB (UNDERPADS AND DIAPERS) ×2 IMPLANT
WATER STERILE IRR 1000ML POUR (IV SOLUTION) ×1 IMPLANT

## 2023-03-03 NOTE — Anesthesia Procedure Notes (Signed)
Procedure Name: LMA Insertion Date/Time: 03/03/2023 2:33 PM  Performed by: Venia Carbon, CRNAPre-anesthesia Checklist: Patient identified, Emergency Drugs available, Suction available, Patient being monitored and Timeout performed Patient Re-evaluated:Patient Re-evaluated prior to induction Oxygen Delivery Method: Circle system utilized Preoxygenation: Pre-oxygenation with 100% oxygen Induction Type: IV induction Ventilation: Mask ventilation without difficulty LMA: LMA inserted LMA Size: 5.0 Number of attempts: 1 Placement Confirmation: breath sounds checked- equal and bilateral, CO2 detector and positive ETCO2

## 2023-03-03 NOTE — H&P (Signed)
Expand All Collapse All       Chief Complaint: Right knee pain        History of Present Illness:    01/22/2023: Presents today for follow-up and MRI discussion of the right knee.  He is still having weakness with extension.   Timothy [name] is a 53 y.o. male presents with right knee pain in the setting of a previous injury.  He states that many years prior when he was playing basketball he did have an injury and a pop.  Subsequently he was told he had a meniscus injury.  He was told that this required surgery but unfortunately was not able to have this due to insurance issues.  He has subsequently moved to [place].  He coaches football.  He does have a history of kidney disease from diabetes although his most recent A1c was 6.  He is on dialysis.  He is having a hard time with any type of walking as the knee continues to buckle and is very painful.       PMH/PSH/Family History/Social History/Meds/Allergies:         Past Medical History:  Diagnosis Date   Allergies     Anemia     Blind left eye     CHF (congestive heart failure) (HCC)     Chronic kidney disease      stage 4   Diabetes mellitus without complication (HCC)     Dyspnea      Patient denies SOB since he started dialysis. States he went away after he started dialysis   HLD (hyperlipidemia)     Hypertension     Pneumonia               Past Surgical History:  Procedure Laterality Date   A/V FISTULAGRAM Left 11/19/2022    Procedure: A/V Fistulagram;  Surgeon: Nada Libman, MD;  Location: [facility];  Service: Cardiovascular;  Laterality: Left;   AV FISTULA PLACEMENT Left 09/19/2022    Procedure: ARTERIOVENOUS (AV) FISTULA CREATION;  Surgeon: Nada Libman, MD;  Location: [facility] OR;  Service: Vascular;  Laterality: Left;   BIOPSY   12/05/2022    Procedure: BIOPSY;  Surgeon: Napoleon Form, MD;  Location: [facility];  Service: Gastroenterology;;   CATARACT EXTRACTION Right      COLONOSCOPY WITH PROPOFOL N/A 12/05/2022    Procedure: COLONOSCOPY WITH PROPOFOL;  Surgeon: Napoleon Form, MD;  Location: [facility];  Service: Gastroenterology;  Laterality: N/A;   ESOPHAGOGASTRODUODENOSCOPY (EGD) WITH PROPOFOL N/A 12/05/2022    Procedure: ESOPHAGOGASTRODUODENOSCOPY (EGD) WITH PROPOFOL;  Surgeon: Napoleon Form, MD;  Location: [facility];  Service: Gastroenterology;  Laterality: N/A;   MENISCUS REPAIR Left     PERIPHERAL VASCULAR INTERVENTION   11/19/2022    Procedure: PERIPHERAL VASCULAR INTERVENTION;  Surgeon: Nada Libman, MD;  Location: [facility];  Service: Cardiovascular;;  Embolization coil   POLYPECTOMY   12/05/2022    Procedure: POLYPECTOMY;  Surgeon: Napoleon Form, MD;  Location: [facility];  Service: Gastroenterology;;        Social History         Socioeconomic History   Marital status: Single      Spouse name: Not on file   Number of children: 1   Years of education: Not on file   Highest education level: Not on file  Occupational History   Occupation: disabled  Tobacco Use   Smoking status: Never  Passive exposure: Never   Smokeless tobacco: Never  Vaping Use   Vaping status: Never Used  Substance and Sexual Activity   Alcohol use: Not Currently   Drug use: Not Currently   Sexual activity: Not on file  Other Topics Concern   Not on file  Social History Narrative   Not on file    Social Drivers of Health        Financial Resource Strain: Low Risk  (12/19/2022)    Overall Financial Resource Strain (CARDIA)     Difficulty of Paying Living Expenses: Not hard at all  Food Insecurity: No Food Insecurity (12/19/2022)    Hunger Vital Sign     Worried About Running Out of Food in the Last Year: Never true     Ran Out of Food in the Last Year: Never true  Transportation Needs: No Transportation Needs (12/19/2022)    PRAPARE - Therapist, art (Medical): No     Lack of  Transportation (Non-Medical): No  Physical Activity: Insufficiently Active (12/19/2022)    Exercise Vital Sign     Days of Exercise per Week: 2 days     Minutes of Exercise per Session: 60 min  Stress: No Stress Concern Present (12/19/2022)    [employer] of Occupational Health - Occupational Stress Questionnaire     Feeling of Stress : Not at all  Social Connections: Moderately Integrated (12/19/2022)    Social Connection and Isolation Panel [NHANES]     Frequency of Communication with Friends and Family: More than three times a week     Frequency of Social Gatherings with Friends and Family: Once a week     Attends Religious Services: More than 4 times per year     Active Member of [employer] or Organizations: No     Attends Engineer, structural: Never     Marital Status: Living with partner         Family History  Problem Relation Age of Onset   Hypertension Mother     Diabetes Mother     Heart attack Mother     Diabetes Brother          Allergies  No Known Allergies         Current Outpatient Medications  Medication Sig Dispense Refill   acetaminophen (TYLENOL) 500 MG tablet Take 1 tablet (500 mg total) by mouth every 8 (eight) hours for 10 days. 30 tablet 0   aspirin EC 325 MG tablet Take 1 tablet (325 mg total) by mouth daily. 14 tablet 0   ibuprofen (ADVIL) 800 MG tablet Take 1 tablet (800 mg total) by mouth every 8 (eight) hours for 10 days. Please take with food, please alternate with acetaminophen 30 tablet 0   oxyCODONE (ROXICODONE) 5 MG immediate release tablet Take 1 tablet (5 mg total) by mouth every 4 (four) hours as needed for severe pain (pain score 7-10) or breakthrough pain. 10 tablet 0   amLODipine (NORVASC) 10 MG tablet Take 10 mg by mouth daily.       Continuous Glucose Sensor (FREESTYLE LIBRE 3 SENSOR) MISC 1 each by Does not apply route See admin instructions. Replace every 14 days 6 each 1   FARXIGA 10 MG TABS tablet Take 10 mg by mouth daily.        furosemide (LASIX) 80 MG tablet Take 80 mg by mouth every Tuesday, Thursday, Saturday, and Sunday.       gentamicin cream (GARAMYCIN)  0.1 % Apply 1 Application topically daily. Apply to right leg once daily. (Patient not taking: Reported on 12/30/2022) 30 g 0   hydrALAZINE (APRESOLINE) 100 MG tablet Take 50 mg by mouth 3 (three) times daily. MAY TAKE EXTRA 50MG  FOR SBP>160       hydrOXYzine (VISTARIL) 25 MG capsule Take 25 mg by mouth at bedtime.       insulin glargine (LANTUS SOLOSTAR) 100 UNIT/ML Solostar Pen INJECT 5 UINTS INTO THE SKIN DAILY (Patient taking differently: Inject 8 Units into the skin at bedtime.) 15 mL 1   Insulin Pen Needle (PEN NEEDLES) 31G X 5 MM MISC 1 each by Does not apply route daily. 100 each 1   isosorbide mononitrate (IMDUR) 30 MG 24 hr tablet Take 30 mg by mouth daily.       labetalol (NORMODYNE) 200 MG tablet Take 400 mg by mouth 2 (two) times daily.       levocetirizine (XYZAL) 5 MG tablet Take 5 mg by mouth daily.       losartan (COZAAR) 25 MG tablet Take 25 mg by mouth daily.       metolazone (ZAROXOLYN) 2.5 MG tablet Take 2.5 mg by mouth daily.       metoprolol succinate (TOPROL-XL) 100 MG 24 hr tablet Take 1 tablet (100 mg total) by mouth daily. (Patient not taking: Reported on 12/30/2022) 30 tablet 3   pantoprazole (PROTONIX) 40 MG tablet Take 1 tablet (40 mg total) by mouth 2 (two) times daily before a meal. 180 tablet 3   sacubitril-valsartan (ENTRESTO) 97-103 MG Take 1 tablet by mouth 2 (two) times daily.       spironolactone (ALDACTONE) 25 MG tablet Take 25 mg by mouth daily.       torsemide (DEMADEX) 20 MG tablet Take 20 mg by mouth 2 (two) times daily.       TRESIBA FLEXTOUCH 100 UNIT/ML FlexTouch Pen Inject 2.5 Units into the skin at bedtime.          No current facility-administered medications for this visit.      Imaging Results (Last 48 hours)  No results found.     Review of Systems:   A ROS was performed including pertinent positives  and negatives as documented in the HPI.   Physical Exam :   Constitutional: NAD and appears stated age Neurological: Alert and oriented Psych: Appropriate affect and cooperative There were no vitals taken for this visit.    Comprehensive Musculoskeletal Exam:     Right knee with a proximately retracted patella.  He is not able to actively extend against gravity.  Range of motion of the right knee is from approximately 20 degrees to 40 degrees.  There is tenderness about the remaining trochlear groove.     Imaging:   Xray (4 views right knee): Evidence of an old patella tendon avulsion with significant retraction and osteoarthritis of the patella   MRI right knee: There is full-thickness chronic patella tendon rupture without significant patellofemoral cartilage loss   I personally reviewed and interpreted the radiographs.     Assessment and Plan:   53 y.o. 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  Unfortunately I did discuss that this is quite a difficult issue.  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  At this time he is having continued weakness with buckling of the right knee in the setting of extensor mechanism deficiency.  MRI  does not show significant osteoarthritis and given that I do believe he would benefit from a VY lengthening of the quadriceps with the patella tendon reconstruction with allograft Achilles.  I did discuss the risks and benefits as well as the rehab protocol.  After discussion he is ultimately elected for this.   Plan for right knee quadriceps tendon lengthening and patella tendon reconstruction with Achilles allograft     After a lengthy discussion of treatment options, including risks, benefits, alternatives, complications of surgical and nonsurgical conservative options, the patient elected surgical repair.    The patient  is aware of the material  risks  and complications including, but not limited to injury to adjacent structures, neurovascular injury, infection, numbness, bleeding, implant failure, thermal burns, stiffness, persistent pain, failure to heal, disease transmission from allograft, need for further surgery, dislocation, anesthetic risks, blood clots, risks of death,and others. The probabilities of surgical success and failure discussed with patient given their particular co-morbidities.The time and nature of expected rehabilitation and recovery was discussed.The patient's questions were all answered preoperatively.  No barriers to understanding were noted. I explained the natural history of the disease process and Rx rationale.  I explained to the patient what I considered to be reasonable expectations given their personal situation.  The final treatment plan was arrived at through a shared patient decision making process model.       I personally saw and evaluated the patient, and participated in the management and treatment plan.   Huel Cote, MD Attending Physician, Orthopedic Surgery   This document was dictated using Dragon voice recognition software. A reasonable attempt at proof reading has been made to minimize errors.

## 2023-03-03 NOTE — Anesthesia Procedure Notes (Signed)
Anesthesia Regional Block: Femoral nerve block   Pre-Anesthetic Checklist: , timeout performed,  Correct Patient, Correct Site, Correct Laterality,  Correct Procedure, Correct Position, site marked,  Risks and benefits discussed,  Surgical consent,  Pre-op evaluation,  At surgeon's request and post-op pain management  Laterality: Right  Prep: Maximum Sterile Barrier Precautions used, chloraprep       Needles:  Injection technique: Single-shot  Needle Type: Echogenic Stimulator Needle     Needle Length: 9cm  Needle Gauge: 22     Additional Needles:   Procedures:,,,, ultrasound used (permanent image in chart),,    Narrative:  Start time: 03/03/2023 1:20 PM End time: 03/03/2023 1:25 PM Injection made incrementally with aspirations every 5 mL.  Performed by: Personally  Anesthesiologist: Lannie Fields, DO  Additional Notes: Monitors applied. No increased pain on injection. No increased resistance to injection. Injection made in 5cc increments. Good needle visualization. Patient tolerated procedure well.

## 2023-03-03 NOTE — Discharge Instructions (Signed)
Discharge Instructions    Attending Surgeon: Huel Cote, MD Office Phone Number: [phone]   Diagnosis and Procedures:    Surgeries Performed: Right knee patella tendon reconstruction with quadriceps lengthening  Discharge Plan:    Diet: Resume usual diet. Begin with light or bland foods.  Drink plenty of fluids.  Activity:  Weight bearing as tolerated with knee immobilzer. You are advised to go home directly from the hospital or surgical center. Restrict your activities.  GENERAL INSTRUCTIONS: 1.  Please apply ice to your wound to help with swelling and inflammation. This will improve your comfort and your overall recovery following surgery.     2. Please call Dr. Serena Croissant office at [phone] with questions Monday-Friday during business hours. If no one answers, please leave a message and someone should get back to the patient within 24 hours. For emergencies please call 911 or proceed to the emergency room.   3. Patient to notify surgical team if experiences any of the following: Bowel/Bladder dysfunction, uncontrolled pain, nerve/muscle weakness, incision with increased drainage or redness, nausea/vomiting and Fever greater than 101.0 F.  Be alert for signs of infection including redness, streaking, odor, fever or chills. Be alert for excessive pain or bleeding and notify your surgeon immediately.  WOUND INSTRUCTIONS:   Leave your dressing, cast, or splint in place until your post operative visit.  Keep it clean and dry.  Always keep the incision clean and dry until the staples/sutures are removed. If there is no drainage from the incision you should keep it open to air. If there is drainage from the incision you must keep it covered at all times until the drainage stops  Do not soak in a bath tub, hot tub, pool, lake or other body of water until 21 days after your surgery and your incision is completely dry and healed.  If you have removable sutures (or  staples) they must be removed 10-14 days (unless otherwise instructed) from the day of your surgery.     1)  Elevate the extremity as much as possible.  2)  Keep the dressing clean and dry.  3)  Please call us if the dressing becomes wet or dirty.  4)  If you are experiencing worsening pain or worsening swelling, please call.     MEDICATIONS: Resume all previous home medications at the previous prescribed dose and frequency unless otherwise noted Start taking the  pain medications on an as-needed basis as prescribed  Please taper down pain medication over the next week following surgery.  Ideally you should not require a refill of any narcotic pain medication.  Take pain medication with food to minimize nausea. In addition to the prescribed pain medication, you may take over-the-counter pain relievers such as Tylenol.  Do NOT take additional tylenol if your pain medication already has tylenol in it.  Aspirin 325mg  daily per instructions on bottle. Narcotic policy: Per [facility] clinic policy, our goal is ensure optimal postoperative pain control with a multimodal pain management strategy. For all [facility] patients, our goal is to wean post-operative narcotic medications by 6 weeks post-operatively, and many times sooner. If this is not possible due to utilization of pain medication prior to surgery, your [facility] doctor will support your acute post-operative pain control for the first 6 weeks postoperatively, with a plan to transition you back to your primary pain team following that. Cyndia Skeeters will work to ensure a Therapist, occupational.       FOLLOWUP INSTRUCTIONS: 1. Follow  up at the Physical Therapy Clinic 3-4 days following surgery. This appointment should be scheduled unless other arrangements have been made.The Physical Therapy scheduling number is [phone] if an appointment has not already been arranged.  2. Contact Dr. Serena Croissant office during office hours at [phone] or the  practice after hours line at [phone] for non-emergencies. For medical emergencies call 911.   Discharge Location: Home

## 2023-03-03 NOTE — Brief Op Note (Signed)
   Brief Op Note  Date of Surgery: 03/03/2023  Preoperative Diagnosis: RIGHT PATELLAR TENDON TEAR  Postoperative Diagnosis: same  Procedure: Procedure(s): RIGHT KNEE QUADRICEPS TENDON LENGTHENING AND PATELLA TENDON RECONSTRUCTION WITH ACHILLES ALLOGRAFT  Implants: Implant Name Type Inv. Item Serial No. Manufacturer Lot No. LRB No. Used Action  GRAFT ACHILLES TENDON - [id number] Bone Implant GRAFT ACHILLES TENDON  [employer] [id number] Right 1 Implanted  IMP SYS 2ND FIX PEEK 4.75X19.1 - [id number] Miscellaneous IMP SYS 2ND FIX PEEK 4.75X19.1  [employer] [id number] Right 1 Implanted  IMP SYS 2ND FIX PEEK 4.75X19.1 - [id number] Miscellaneous IMP SYS 2ND FIX PEEK 4.75X19.1  [employer] [id number] Right 1 Implanted    Surgeons: Surgeon(s): Huel Cote, MD  Anesthesia: General    Estimated Blood Loss: See anesthesia record  Complications: None  Condition to PACU: Stable  Benancio Deeds, MD 03/03/2023 3:48 PM

## 2023-03-03 NOTE — Interval H&P Note (Signed)
History and Physical Interval Note:  03/03/2023 1:02 PM  Timothy [name]  has presented today for surgery, with the diagnosis of RIGHT PATELLAR TENDON TEAR.  The various methods of treatment have been discussed with the patient and family. After consideration of risks, benefits and other options for treatment, the patient has consented to  Procedure(s): RIGHT PATELLAR TENDON RECONSTRUCTION (Right) RIGHT QUADRICEPS TENDON LENGTHENING (Right) as a surgical intervention.  The patient's history has been reviewed, patient examined, no change in status, stable for surgery.  I have reviewed the patient's chart and labs.  Questions were answered to the patient's satisfaction.     Huel Cote

## 2023-03-03 NOTE — Transfer of Care (Signed)
Immediate Anesthesia Transfer of Care Note  Patient: Timothy [name]  Procedure(s) Performed: RIGHT KNEE QUADRICEPS TENDON LENGTHENING AND PATELLA TENDON RECONSTRUCTION WITH ACHILLES ALLOGRAFT (Right: Knee)  Patient Location: PACU  Anesthesia Type:General  Level of Consciousness: alert , oriented, and patient cooperative  Airway & Oxygen Therapy: Patient connected to face mask oxygen  Post-op Assessment: Report given to RN, Post -op Vital signs reviewed and stable, Patient moving all extremities, Patient moving all extremities X 4, and Patient able to stick tongue midline  Post vital signs: Reviewed and stable  Last Vitals:  Vitals Value Taken Time  BP 157/92 03/03/23 1554  Temp    Pulse 67 03/03/23 1556  Resp 21 03/03/23 1556  SpO2 100 % 03/03/23 1556  Vitals shown include unfiled device data.  Last Pain:  Vitals:   03/03/23 1340  TempSrc:   PainSc: 0-No pain         Complications: No notable events documented.

## 2023-03-03 NOTE — Anesthesia Postprocedure Evaluation (Signed)
Anesthesia Post Note  Patient: Timothy [name]  Procedure(s) Performed: RIGHT KNEE QUADRICEPS TENDON LENGTHENING AND PATELLA TENDON RECONSTRUCTION WITH ACHILLES ALLOGRAFT (Right: Knee)     Patient location during evaluation: PACU Anesthesia Type: General Level of consciousness: awake and alert and oriented Pain management: pain level controlled Vital Signs Assessment: post-procedure vital signs reviewed and stable Respiratory status: spontaneous breathing, nonlabored ventilation and respiratory function stable Cardiovascular status: blood pressure returned to baseline and stable Postop Assessment: no apparent nausea or vomiting Anesthetic complications: no   No notable events documented.  Last Vitals:  Vitals:   03/03/23 1615 03/03/23 1630  BP: 124/76   Pulse: 66 66  Resp: 18 18  Temp:    SpO2: 93% 93%    Last Pain:  Vitals:   03/03/23 1615  TempSrc:   PainSc: 0-No pain                 Eean Buss A.

## 2023-03-03 NOTE — Op Note (Signed)
Date of Surgery: 03/03/2023  INDICATIONS: Timothy [name] is a 53 y.o.-year-old male with chronic right patella tendon rupture with loss of active extension.  The risk and benefits of the procedure were discussed in detail and documented in the pre-operative evaluation.   PREOPERATIVE DIAGNOSIS: 1.  Chronic patella tendon rupture  POSTOPERATIVE DIAGNOSIS: Same.  PROCEDURE: 1.  Right knee patella tendon reconstruction with Achilles allograft 2.  Right knee quadriceps tendon lengthening and repair  SURGEON: Benancio Deeds MD  ASSISTANT: Ardeen Fillers, ATC  ANESTHESIA:  general plus femoral nerve block  IV FLUIDS AND URINE: See anesthesia record.  ANTIBIOTICS: Ancef  ESTIMATED BLOOD LOSS: 25 mL.  IMPLANTS:  Implant Name Type Inv. Item Serial No. Manufacturer Lot No. LRB No. Used Action  GRAFT ACHILLES TENDON - [id number] Bone Implant GRAFT ACHILLES TENDON  [employer] [id number] Right 1 Implanted  IMP SYS 2ND FIX PEEK 4.75X19.1 - [id number] Miscellaneous IMP SYS 2ND FIX PEEK 4.75X19.1  [employer] [id number] Right 1 Implanted  IMP SYS 2ND FIX PEEK 4.75X19.1 - [id number] Miscellaneous IMP SYS 2ND FIX PEEK 4.75X19.1  [employer] [id number] Right 1 Implanted    DRAINS: None  CULTURES: None  COMPLICATIONS: none  DESCRIPTION OF PROCEDURE:   The patient was seen and identified in the preoperative holding area.  The correct site was marked according to universal protocol.  Antibiotics was given 1 hour prior to skin incision.  Anesthesia performed a femoral nerve block.  He was subsequently transferred to the operating room.  He was transferred over the operating room table.  Anesthesia was induced.  He is prepped and draped in the supine position with all bony prominences padded.  Final timeout was performed.  At this time I made a midline approach to the knee.  15 blade was used to incise through skin.  Electrocautery was used to dissect down to the level of the extensor mechanism.   The patella was identified with the quadriceps tendon.  Cobb elevator was used to clearly identify the plane of the quadriceps tendon.  The patella tendon had avulsed off of the patella with significant degradation of the tissue.  There was a stump of tissue that remained attached to the right tibial tubercle.  At this time the quadriceps tendon lengthening was done.  A parapatellar arthrotomy was created and the quadriceps tendon was released midway through the tendon in order to advance this.  A vastus lateralis advancement was created by suturing the quadriceps tendon attached to the patella into a more distal aspect of the arthrotomy site.  The remaining arthrotomy site was closed with 0 FiberWire.  This produced excellent distalization of the patella to the level of the trochlea.  Given that there was some patellar osteoarthritis the decision was made not to advance into the trochlear groove for fear of residual patellofemoral pain.  At this time the patella tendon reconstruction was performed.  2 knee fiber tacks were placed into the medial and lateral aspect of the distal patella.  The sutures with needles were then brought up through the wider portion of a Achilles tendon allograft.  This had excellent apposition to the patella.  The internal brace components of this were brought up and over the top of the allograft.  At this time a fiber loop was then used to loop around the distal aspect of the graft.  At this time the leg was held in 30 degrees of flexion and 3 limbs of suture were placed medial and lateral at  the tibial tubercle each encompassing 2 limbs of internal brace and 1 limb from the graft itself.  Following this the remaining arthrotomy was closed with an 0 Vicryl.  The wound was thoroughly irrigated and Vanco powder was placed.  I did bring the leg into 45 degrees of flexion and there was specifically no disruption of the repair with excellent gliding of the patella mechanism.  At this time  the wound was again irrigated and closed in layers of 0 Vicryl 2-0 Vicryl 3-0 nylon.  Soft dressing was applied with Xeroform, gauze, Webril, Ace wrap.  Knee immobilizer was placed.  Iceman was placed.  All counts were correct at the end of the case.  He was taken to the PACU without complication    POSTOPERATIVE PLAN: He will be weightbearing as tolerated on the right leg with his brace locked in extension.  He will be placed on aspirin for likely prevention.  He will begin physical therapy immediately post op.  Benancio Deeds, MD 3:48 PM

## 2023-03-04 ENCOUNTER — Encounter: Payer: Self-pay | Admitting: "Endocrinology

## 2023-03-04 ENCOUNTER — Ambulatory Visit (INDEPENDENT_AMBULATORY_CARE_PROVIDER_SITE_OTHER): Payer: 59 | Admitting: "Endocrinology

## 2023-03-04 ENCOUNTER — Encounter: Payer: 59 | Attending: "Endocrinology | Admitting: Nutrition

## 2023-03-04 ENCOUNTER — Other Ambulatory Visit: Payer: Self-pay

## 2023-03-04 VITALS — BP 120/70 | HR 86 | Ht 66.0 in | Wt 180.4 lb

## 2023-03-04 DIAGNOSIS — E1121 Type 2 diabetes mellitus with diabetic nephropathy: Secondary | ICD-10-CM | POA: Diagnosis not present

## 2023-03-04 DIAGNOSIS — Z794 Long term (current) use of insulin: Secondary | ICD-10-CM

## 2023-03-04 DIAGNOSIS — E11649 Type 2 diabetes mellitus with hypoglycemia without coma: Secondary | ICD-10-CM

## 2023-03-04 DIAGNOSIS — Z7984 Long term (current) use of oral hypoglycemic drugs: Secondary | ICD-10-CM | POA: Diagnosis not present

## 2023-03-04 LAB — POCT GLYCOSYLATED HEMOGLOBIN (HGB A1C): Hemoglobin A1C: 6.5 % — AB (ref 4.0–5.6)

## 2023-03-04 MED ORDER — EMPAGLIFLOZIN-LINAGLIPTIN 25-5 MG PO TABS: 1.0000 | ORAL_TABLET | Freq: Every day | ORAL | 1 refills | Status: AC

## 2023-03-04 NOTE — Progress Notes (Signed)
Outpatient Endocrinology Note Timothy [place], MD  03/04/23   Timothy [name] Sep 30, 1970 [id number]  Referring Provider: de [name], Timothy J, MD Primary Care Provider: de [name], Timothy J, MD Reason for consultation: Subjective   Assessment & Plan  Diagnoses and all orders for this visit:  Uncontrolled type 2 diabetes mellitus with hypoglycemia without coma (HCC) -     POCT glycosylated hemoglobin (Hb A1C) -     Empagliflozin-linaGLIPtin 25-5 MG TABS; Take 1 tablet by mouth daily. 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, neuropathy, ESRD on HD  C-peptide + at 5.4 in 10/31/22 Lab Results  Component Value Date   GFR 7.22 (LL) 11/07/2022   Hba1c goal less than 7, current Hba1c is 6.3 on 01/2023 Lab Results  Component Value Date   HGBA1C 6.5 (A) 03/04/2023   Will recommend the following: Empagliflozin-linagliptin 25/5mg  every day  Stop Farxiga 10 mg every day  Lantus 5 units as needed for BG >200 at bedtime  No known contraindications/side effects to any of above medications  -Last LD and Tg are as follows: Lab Results  Component Value Date   LDLCALC 147 (H) 10/12/2020    Lab Results  Component Value Date   TRIG 84 10/12/2020   -not on statin, ESRD pt on HD -Follow low fat diet and exercise   -Blood pressure goal <140/90 - Microalbumin/creatinine goal is < 30 -Last MA/Cr is as follows: No results found for: "MICROALBUR", "MALB24HUR" -on ACE/ARB sacubitril-valsartan (ENTRESTO) 97-103 MG -diet changes including salt restriction -limit eating outside -counseled BP targets per standards of diabetes care -uncontrolled blood pressure can lead to retinopathy, nephropathy and cardiovascular and atherosclerotic heart disease  Reviewed and counseled on: -A1C  target -Blood sugar targets -Complications of uncontrolled diabetes  -Checking blood sugar before meals and bedtime and bring log next visit -All medications with mechanism of action and side effects -Hypoglycemia management: rule of 15's, Glucagon Emergency Kit and medical alert ID -low-carb low-fat plate-method diet -At least 20 minutes of physical activity per day -Annual dilated retinal eye exam and foot exam -compliance and follow up needs -follow up as scheduled or earlier if problem gets worse  Call if blood sugar is less than 70 or consistently above 250    Take a 15 gm snack of carbohydrate at bedtime before you go to sleep if your blood sugar is less than 100.    If you are going to fast after midnight for a test or procedure, ask your physician for instructions on how to reduce/decrease your insulin dose.    Call if blood sugar is less than 70 or consistently above 250  -Treating a low sugar by rule of 15  (15 gms of sugar every 15 min until sugar is more than 70) If you feel your sugar is low, test your sugar to be sure If your sugar is low (less than 70), then take 15 grams of a fast acting Carbohydrate (3-4 glucose tablets or glucose gel or 4 ounces of juice or regular soda) Recheck your sugar 15 min after treating low to make sure it is more than 70 If sugar is still less than 70, treat again with 15 grams of carbohydrate  Don't drive the hour of hypoglycemia  If unconscious/unable to eat or drink by mouth, use glucagon injection or nasal spray baqsimi and call 911. Can repeat again in 15 min if still unconscious.  Return in about 6 weeks (around 04/15/2023) for visit, labs today.   I have reviewed current medications, nurse's notes, allergies, vital signs, past medical and surgical history, family medical history, and social history for this encounter. Counseled patient on symptoms, examination findings, lab findings, imaging results, treatment decisions and  monitoring and prognosis. The patient understood the recommendations and agrees with the treatment plan. All questions regarding treatment plan were fully answered.  Timothy Captain Cook, MD  03/04/23    History of Present Illness Timothy [name] is a 53 y.o. year old male who presents for evaluation of Type [name] diabetes mellitus.  Timothy [name] was first diagnosed in 2004.   Diabetes education +  Home diabetes regimen: Farxiga 10 mg every day  Lantus 8 units at bedtime  COMPLICATIONS -  MI/Stroke +  retinopathy +  neuropathy +  nephropathy ESRD on M/W/F  BLOOD SUGAR DATA  CGM interpretation: At today's visit, we reviewed her CGM downloads. The full report is scanned in the media. Reviewing the CGM trends, BG are mostly well controlled with lows overnight.   Physical Exam  BP 120/70   Pulse 86   Ht 5\' 6"  (1.676 m)   Wt 180 lb 6.4 oz (81.8 kg)   SpO2 95%   BMI 29.12 kg/m    Constitutional: well developed, well nourished Head: normocephalic, atraumatic Eyes: sclera anicteric, no redness Neck: supple Lungs: normal respiratory effort Neurology: alert and oriented Skin: dry, no appreciable rashes Musculoskeletal: no appreciable defects Psychiatric: normal mood and affect Diabetic Foot Exam - Simple   No data filed      Current Medications Patient's Medications  New Prescriptions   EMPAGLIFLOZIN-LINAGLIPTIN 25-5 MG TABS    Take 1 tablet by mouth daily.  Previous Medications   AMLODIPINE (NORVASC) 10 MG TABLET    Take 10 mg by mouth daily.   ATORVASTATIN (LIPITOR) 40 MG TABLET    Take 40 mg by mouth daily.   CONTINUOUS GLUCOSE SENSOR (DEXCOM G7 SENSOR) MISC    1 Device by Does not apply route continuous.   CONTINUOUS GLUCOSE SENSOR (FREESTYLE LIBRE 3 SENSOR) MISC    1 each by Does not apply route See admin instructions. Replace every 14 days   FUROSEMIDE (LASIX) 80 MG TABLET    Take 80 mg by mouth every Tuesday, Thursday, Saturday, and Sunday.   HYDRALAZINE  (APRESOLINE) 100 MG TABLET    Take 50 mg by mouth 3 (three) times daily. MAY TAKE EXTRA 50MG  FOR SBP>160   INSULIN GLARGINE (LANTUS SOLOSTAR) 100 UNIT/ML SOLOSTAR PEN    INJECT 5 UINTS INTO THE SKIN DAILY   ISOSORBIDE MONONITRATE (IMDUR) 60 MG 24 HR TABLET    Take 60 mg by mouth daily.   LABETALOL (NORMODYNE) 200 MG TABLET    Take 400 mg by mouth 2 (two) times daily.   OXYCODONE (ROXICODONE) 5 MG IMMEDIATE RELEASE TABLET    Take 1 tablet (5 mg total) by mouth every 4 (four) hours as needed for severe pain (pain score 7-10) or breakthrough pain.   PANTOPRAZOLE (PROTONIX) 40 MG TABLET    Take 1 tablet (40 mg total) by mouth 2 (two) times daily before a meal.   TORSEMIDE (DEMADEX) 20 MG TABLET    Take 20 mg by mouth 2 (two)  times daily.  Modified Medications   No medications on file  Discontinued Medications   FARXIGA 10 MG TABS TABLET    Take 10 mg by mouth daily.    Allergies No Known Allergies  Past Medical History Past Medical History:  Diagnosis Date   Allergies    Anemia    Blind left eye    CHF (congestive heart failure) (HCC)    Chronic kidney disease    ESRD - HD M,W,F   Diabetes mellitus without complication (HCC)    Type2   Dyspnea    Patient denies SOB since he started dialysis. States he went away after he started dialysis   HLD (hyperlipidemia)    Hypertension    Pneumonia    x2    Past Surgical History Past Surgical History:  Procedure Laterality Date   A/V FISTULAGRAM Left 11/19/2022   Procedure: A/V Fistulagram;  Surgeon: Nada Libman, MD;  Location: [facility];  Service: Cardiovascular;  Laterality: Left;   AV FISTULA PLACEMENT Left 09/19/2022   Procedure: ARTERIOVENOUS (AV) FISTULA CREATION;  Surgeon: Nada Libman, MD;  Location: [facility] OR;  Service: Vascular;  Laterality: Left;   BIOPSY  12/05/2022   Procedure: BIOPSY;  Surgeon: Napoleon Form, MD;  Location: [facility];  Service: Gastroenterology;;   CATARACT EXTRACTION Right     COLONOSCOPY WITH PROPOFOL N/A 12/05/2022   Procedure: COLONOSCOPY WITH PROPOFOL;  Surgeon: Napoleon Form, MD;  Location: [facility];  Service: Gastroenterology;  Laterality: N/A;   ESOPHAGOGASTRODUODENOSCOPY (EGD) WITH PROPOFOL N/A 12/05/2022   Procedure: ESOPHAGOGASTRODUODENOSCOPY (EGD) WITH PROPOFOL;  Surgeon: Napoleon Form, MD;  Location: [facility];  Service: Gastroenterology;  Laterality: N/A;   FRACTURE SURGERY Left    [place]   MENISCUS REPAIR Left    PERIPHERAL VASCULAR INTERVENTION  11/19/2022   Procedure: PERIPHERAL VASCULAR INTERVENTION;  Surgeon: Nada Libman, MD;  Location: [facility];  Service: Cardiovascular;;  Embolization coil   POLYPECTOMY  12/05/2022   Procedure: POLYPECTOMY;  Surgeon: Napoleon Form, MD;  Location: [facility];  Service: Gastroenterology;;    Family History family history includes Diabetes in his brother and mother; Heart attack in his mother; Hypertension in his mother.  Social History Social History   Socioeconomic History   Marital status: Single    Spouse name: Not on file   Number of children: 1   Years of education: Not on file   Highest education level: Not on file  Occupational History   Occupation: disabled  Tobacco Use   Smoking status: Never    Passive exposure: Never   Smokeless tobacco: Never  Vaping Use   Vaping status: Never Used  Substance and Sexual Activity   Alcohol use: Not Currently   Drug use: Not Currently   Sexual activity: Yes  Other Topics Concern   Not on file  Social History Narrative   Not on file   Social Drivers of Health   Financial Resource Strain: Low Risk  (12/19/2022)   Overall Financial Resource Strain (CARDIA)    Difficulty of Paying Living Expenses: Not hard at all  Food Insecurity: No Food Insecurity (12/19/2022)   Hunger Vital Sign    Worried About Running Out of Food in the Last Year: Never true    Ran Out of Food in the Last Year: Never true   Transportation Needs: No Transportation Needs (12/19/2022)   PRAPARE - Transportation    Lack of Transportation (Medical): No    Lack of  Transportation (Non-Medical): No  Physical Activity: Insufficiently Active (12/19/2022)   Exercise Vital Sign    Days of Exercise per Week: 2 days    Minutes of Exercise per Session: 60 min  Stress: No Stress Concern Present (12/19/2022)   [employer] of Occupational Health - Occupational Stress Questionnaire    Feeling of Stress : Not at all  Social Connections: Moderately Integrated (12/19/2022)   Social Connection and Isolation Panel [NHANES]    Frequency of Communication with Friends and Family: More than three times a week    Frequency of Social Gatherings with Friends and Family: Once a week    Attends Religious Services: More than 4 times per year    Active Member of Clubs or Organizations: No    Attends Banker Meetings: Never    Marital Status: Living with partner  Intimate Partner Violence: Not At Risk (12/19/2022)   Humiliation, Afraid, Rape, and Kick questionnaire    Fear of Current or Ex-Partner: No    Emotionally Abused: No    Physically Abused: No    Sexually Abused: No    Lab Results  Component Value Date   HGBA1C 6.5 (A) 03/04/2023   HGBA1C 6.0 (H) 02/25/2023   HGBA1C 5.8 (H) 08/05/2022   Lab Results  Component Value Date   CHOL 204 (H) 10/12/2020   Lab Results  Component Value Date   HDL 42 10/12/2020   Lab Results  Component Value Date   LDLCALC 147 (H) 10/12/2020   Lab Results  Component Value Date   TRIG 84 10/12/2020   Lab Results  Component Value Date   CHOLHDL 4.9 10/12/2020   Lab Results  Component Value Date   CREATININE 8.90 (H) 03/03/2023   Lab Results  Component Value Date   GFR 7.22 (LL) 11/07/2022   No results found for: "MICROALBUR", "MALB24HUR"    Component Value Date/Time   NA 136 03/03/2023 1236   NA 139 08/05/2022 1507   K 3.4 (L) 03/03/2023 1236   CL 100  03/03/2023 1236   CO2 33 (H) 11/07/2022 1245   GLUCOSE 201 (H) 03/03/2023 1236   BUN 29 (H) 03/03/2023 1236   BUN 76 (HH) 08/05/2022 1507   CREATININE 8.90 (H) 03/03/2023 1236   CALCIUM 8.5 11/07/2022 1245   PROT 8.2 11/07/2022 1245   PROT 7.0 08/05/2022 1507   ALBUMIN 3.9 11/07/2022 1245   ALBUMIN 3.5 (L) 08/05/2022 1507   AST 14 11/07/2022 1245   ALT 11 11/07/2022 1245   ALKPHOS 77 11/07/2022 1245   BILITOT 0.4 11/07/2022 1245   BILITOT 0.3 08/05/2022 1507   GFRNONAA 6 (L) 08/21/2022 1851   GFRAA 51 (L) 06/21/2019 0511      Latest Ref Rng & Units 03/03/2023   12:36 PM 12/05/2022    9:30 AM 11/19/2022    5:48 AM  BMP  Glucose 70 - 99 mg/dL 098  119  147   BUN 6 - 20 mg/dL 29  23  28    Creatinine 0.61 - 1.24 mg/dL 8.29  5.62  1.30   Sodium 135 - 145 mmol/L 136  142  141   Potassium 3.5 - 5.1 mmol/L 3.4  4.0  4.0   Chloride 98 - 111 mmol/L 100  102  101        Component Value Date/Time   WBC 5.7 02/25/2023 1117   RBC 3.95 (L) 02/25/2023 1117   HGB 10.9 (L) 03/03/2023 1236   HGB 7.5 (L) 08/05/2022 1507   HCT  32.0 (L) 03/03/2023 1236   HCT 23.4 (L) 08/05/2022 1507   PLT 194 02/25/2023 1117   PLT 297 08/05/2022 1507   MCV 85.8 02/25/2023 1117   MCV 79 08/05/2022 1507   MCH 27.6 02/25/2023 1117   MCHC 32.2 02/25/2023 1117   RDW 15.9 (H) 02/25/2023 1117   RDW 17.1 (H) 08/05/2022 1507   LYMPHSABS 1.1 11/07/2022 1245   LYMPHSABS 0.7 08/05/2022 1507   MONOABS 0.9 11/07/2022 1245   EOSABS 0.3 11/07/2022 1245   EOSABS 0.4 08/05/2022 1507   BASOSABS 0.0 11/07/2022 1245   BASOSABS 0.0 08/05/2022 1507     Parts of this note may have been dictated using voice recognition software. There may be variances in spelling and vocabulary which are unintentional. Not all errors are proofread. Please notify the Thereasa Parkin if any discrepancies are noted or if the meaning of any statement is not clear.

## 2023-03-04 NOTE — Patient Instructions (Signed)
Change sensor every 10 days. Call Dexcom help line if sensor falls off, or if questions about how to use this.

## 2023-03-04 NOTE — Patient Instructions (Signed)
Will recommend the following: Empagliflozin-linagliptin 25/5mg  every day  Stop Farxiga 10 mg every day  Lantus 5 units as needed for BG more than 200 at bedtime

## 2023-03-04 NOTE — Progress Notes (Signed)
Patient seeing Dr. Roosevelt Locks today and needed help with using his Dexcom.  He downloaded the app to his phone and we linked the reading to Timothy [name]. HE was trained on how to apply, link and remove the sensor.   WE attached the sensor to his right upper outer arm (his left arm is used for dialysis) and the sensor was started by the patient.  We reviewed what the arrows mean and when it to use a meter to monitor blood sugars.  He reported having a meter at home for this.  He had no final questions.

## 2023-03-05 ENCOUNTER — Encounter (HOSPITAL_BASED_OUTPATIENT_CLINIC_OR_DEPARTMENT_OTHER): Payer: Self-pay | Admitting: Family Medicine

## 2023-03-05 ENCOUNTER — Encounter: Payer: Self-pay | Admitting: "Endocrinology

## 2023-03-05 ENCOUNTER — Ambulatory Visit (INDEPENDENT_AMBULATORY_CARE_PROVIDER_SITE_OTHER): Payer: 59 | Admitting: Family Medicine

## 2023-03-05 VITALS — BP 122/71 | HR 81 | Temp 98.0°F | Ht 66.0 in | Wt 176.6 lb

## 2023-03-05 DIAGNOSIS — N2581 Secondary hyperparathyroidism of renal origin: Secondary | ICD-10-CM | POA: Diagnosis not present

## 2023-03-05 DIAGNOSIS — I10 Essential (primary) hypertension: Secondary | ICD-10-CM | POA: Diagnosis not present

## 2023-03-05 DIAGNOSIS — E1122 Type 2 diabetes mellitus with diabetic chronic kidney disease: Secondary | ICD-10-CM

## 2023-03-05 DIAGNOSIS — Z992 Dependence on renal dialysis: Secondary | ICD-10-CM | POA: Diagnosis not present

## 2023-03-05 DIAGNOSIS — D631 Anemia in chronic kidney disease: Secondary | ICD-10-CM | POA: Diagnosis not present

## 2023-03-05 DIAGNOSIS — R52 Pain, unspecified: Secondary | ICD-10-CM | POA: Diagnosis not present

## 2023-03-05 DIAGNOSIS — D689 Coagulation defect, unspecified: Secondary | ICD-10-CM | POA: Diagnosis not present

## 2023-03-05 DIAGNOSIS — Z794 Long term (current) use of insulin: Secondary | ICD-10-CM

## 2023-03-05 DIAGNOSIS — N186 End stage renal disease: Secondary | ICD-10-CM | POA: Diagnosis not present

## 2023-03-05 DIAGNOSIS — N184 Chronic kidney disease, stage 4 (severe): Secondary | ICD-10-CM | POA: Diagnosis not present

## 2023-03-05 LAB — LIPID PANEL
Cholesterol: 164 mg/dL (ref <200)
HDL: 44 mg/dL (ref >=40)
LDL Cholesterol (Calc): 105 mg/dL — ABNORMAL HIGH
Non-HDL Cholesterol (Calc): 120 mg/dL (ref <130)
Total CHOL/HDL Ratio: 3.7 (calc) (ref <5.0)
Triglycerides: 67 mg/dL (ref <150)

## 2023-03-05 LAB — MICROALBUMIN / CREATININE URINE RATIO
Creatinine, Urine: 169 mg/dL (ref 20–320)
Microalb Creat Ratio: 664 mg/g{creat} — ABNORMAL HIGH (ref <30)
Microalb, Ur: 112.3 mg/dL

## 2023-03-05 MED ORDER — ATORVASTATIN CALCIUM 40 MG PO TABS: 40.0000 mg | ORAL_TABLET | Freq: Every day | ORAL | 1 refills | Status: AC

## 2023-03-05 NOTE — Assessment & Plan Note (Signed)
Patient continues with medications as prescribed.  Did have recent visit with endocrinologist.  Was started on new medication, empagliflozin-linagliptin.  Denies any issues with medication thus far.  He reports that they are looking to gradually transition patient off of insulin.  Most recent hemoglobin A1c at goal at 6.5%. Given current progress, can continue with current medication regimen, no changes needed today Recommend continued follow-up with endocrinology, recommendation was for 6-week follow-up from appointment yesterday

## 2023-03-05 NOTE — Progress Notes (Signed)
    Procedures performed today:    None.  Independent interpretation of notes and tests performed by another provider:   None.  Brief History, Exam, Impression, and Recommendations:    BP 122/71   Pulse 81   Temp 98 F (36.7 C) (Oral)   Ht 5\' 6"  (1.676 m)   Wt 176 lb 9.6 oz (80.1 kg)   SpO2 93%   BMI 28.50 kg/m   Patient did have surgery recently with orthopedist related to right knee.  He is currently doing well, does have postoperative pain.  He does have follow-up planned to monitor progress following recent surgery  Accelerated hypertension Assessment & Plan: Blood pressure is at goal in office today.  He continues with medications as prescribed including amlodipine, hydralazine, Imdur, labetalol.  No current issues with chest pain or headaches.  Last appointment with cardiology was in November 2024 with recommendation for 20-month follow-up. Given good blood pressure control, can continue with current medication regimen, no changes needed at this time.  Recommend follow-up with cardiology as scheduled   Type 2 diabetes mellitus with stage 4 chronic kidney disease, with long-term current use of insulin (HCC) Assessment & Plan: Patient continues with medications as prescribed.  Did have recent visit with endocrinologist.  Was started on new medication, empagliflozin-linagliptin.  Denies any issues with medication thus far.  He reports that they are looking to gradually transition patient off of insulin.  Most recent hemoglobin A1c at goal at 6.5%. Given current progress, can continue with current medication regimen, no changes needed today Recommend continued follow-up with endocrinology, recommendation was for 6-week follow-up from appointment yesterday   Other orders -     Atorvastatin Calcium; Take 1 tablet (40 mg total) by mouth daily.  Dispense: 90 tablet; Refill: 1  Return in about 4 months (around 07/03/2023) for diabetes,  hypertension.   ___________________________________________ Lessa Huge [name], MD, ABFM, CAQSM Primary Care and Sports Medicine [facility]

## 2023-03-05 NOTE — Therapy (Unsigned)
OUTPATIENT PHYSICAL THERAPY LOWER EXTREMITY EVALUATION   Patient Name: Timothy [name] MRN: [id number] DOB:1970/07/20, 53 y.o., male Today's Date: 03/06/2023  END OF SESSION:  PT End of Session - 03/06/23 0753     Visit Number 1    Date for PT Re-Evaluation 05/29/23    PT Start Time 0753    PT Stop Time 0830    PT Time Calculation (min) 37 min    Equipment Utilized During Treatment Other (comment)   R knee brace locked in extension   Activity Tolerance Patient tolerated treatment well    Behavior During Therapy [facility] for tasks assessed/performed             Past Medical History:  Diagnosis Date   Allergies    Anemia    Blind left eye    CHF (congestive heart failure) (HCC)    Chronic kidney disease    ESRD - HD M,W,F   Diabetes mellitus without complication (HCC)    Type2   Dyspnea    Patient denies SOB since he started dialysis. States he went away after he started dialysis   HLD (hyperlipidemia)    Hypertension    Pneumonia    x2   Past Surgical History:  Procedure Laterality Date   A/V FISTULAGRAM Left 11/19/2022   Procedure: A/V Fistulagram;  Surgeon: Nada Libman, MD;  Location: [facility];  Service: Cardiovascular;  Laterality: Left;   AV FISTULA PLACEMENT Left 09/19/2022   Procedure: ARTERIOVENOUS (AV) FISTULA CREATION;  Surgeon: Nada Libman, MD;  Location: [facility] OR;  Service: Vascular;  Laterality: Left;   BIOPSY  12/05/2022   Procedure: BIOPSY;  Surgeon: Napoleon Form, MD;  Location: [facility]OS[facility];  Service: Gastroenterology;;   CATARACT EXTRACTION Right    COLONOSCOPY WITH PROPOFOL N/A 12/05/2022   Procedure: COLONOSCOPY WITH PROPOFOL;  Surgeon: Napoleon Form, MD;  Location: [facility];  Service: Gastroenterology;  Laterality: N/A;   ESOPHAGOGASTRODUODENOSCOPY (EGD) WITH PROPOFOL N/A 12/05/2022   Procedure: ESOPHAGOGASTRODUODENOSCOPY (EGD) WITH PROPOFOL;  Surgeon: Napoleon Form, MD;  Location: [facility];  Service:  Gastroenterology;  Laterality: N/A;   FRACTURE SURGERY Left    [place]   MENISCUS REPAIR Left    PERIPHERAL VASCULAR INTERVENTION  11/19/2022   Procedure: PERIPHERAL VASCULAR INTERVENTION;  Surgeon: Nada Libman, MD;  Location: [facility];  Service: Cardiovascular;;  Embolization coil   POLYPECTOMY  12/05/2022   Procedure: POLYPECTOMY;  Surgeon: Napoleon Form, MD;  Location: [facility];  Service: Gastroenterology;;   Patient Active Problem List   Diagnosis Date Noted   Rupture of right patellar tendon 03/03/2023   Other disorders of phosphorus metabolism 01/04/2023   Heme positive stool 12/05/2022   Generalized abdominal discomfort 12/05/2022   Polyp of ascending colon 12/05/2022   Gastritis and gastroduodenitis 12/05/2022   Gastric polyp 12/05/2022   Foot callus 12/03/2022   Iron deficiency anemia, unspecified 11/01/2022   Angiodysplasia of stomach and duodenum with bleeding 09/06/2022   Other iron deficiency anemias 08/30/2022   Allergy, unspecified, initial encounter 08/20/2022   Anaphylactic reaction due to adverse effect of correct drug or medicament properly administered, initial encounter 08/20/2022   Anaphylactic shock, unspecified, initial encounter 08/20/2022   Anemia in chronic kidney disease 08/20/2022   Coagulation defect, unspecified (HCC) 08/20/2022   Diarrhea, unspecified 08/20/2022   Encounter for immunization 08/20/2022   Encounter for screening for respiratory tuberculosis 08/20/2022   End stage renal disease (HCC) 08/20/2022   Fever, unspecified 08/20/2022  Nausea 08/20/2022   Pain, unspecified 08/20/2022   Pruritus, unspecified 08/20/2022   Dependence on renal dialysis (HCC) 08/16/2022   Hyperlipidemia, unspecified 08/16/2022   Long term (current) use of insulin (HCC) 08/16/2022   Secondary hyperparathyroidism of renal origin (HCC) 08/16/2022   Vitamin D deficiency, unspecified 08/16/2022   Hematemesis 08/05/2022   Erectile dysfunction  01/18/2021   Pneumonia 11/15/2020   Hemoptysis 11/15/2020   CKD (chronic kidney disease), stage IV (HCC) 11/15/2020   Positive D dimer 11/15/2020   Knee injury, right, subsequent encounter 09/07/2020   Type 2 diabetes mellitus (HCC) 08/22/2020   Acute respiratory failure with hypoxia (HCC) 06/22/2020   Multifocal pneumonia 06/22/2020   Accelerated hypertension 06/20/2019   Elevated troponin 06/19/2019   Chronic diastolic CHF (congestive heart failure) (HCC) 06/19/2019   Hypoalbuminemia 06/19/2019   Hypertensive urgency 06/19/2019   AKI (acute kidney injury) (HCC) 06/19/2019   CHF (congestive heart failure) (HCC) 06/19/2019    PCP: [name], [name], MD  REFERRING PROVIDER: Huel Cote, MD   REFERRING DIAG: [phone] (ICD-10-CM) - Rupture of right patellar tendon, initial encounter   THERAPY DIAG:  Muscle weakness (generalized)  Localized edema  Other abnormalities of gait and mobility  Unsteadiness on feet  Stiffness of right knee, not elsewhere classified  Rationale for Evaluation and Treatment: Rehabilitation  ONSET DATE: 02/19/23, 03/03/23-right knee quadriceps tendon lengthening and patella tendon reconstruction with Achilles allograft   SUBJECTIVE:   SUBJECTIVE STATEMENT: Patient arrives with no crutches. He plans to call the Dr. to obtain some.  PERTINENT HISTORY: Per referring physician note: PMHx: anemia, Blind L eye, CHF, CKD, stage 4, DM- on Dialysis, HLD, HTN, L patellar tendon repair about 20 years ago. Right knee with a proximately retracted patella. He is not able to actively extend against gravity. Range of motion of the right knee is from approximately 20 degrees to 40 degrees. There is tenderness about the remaining trochlear groove.  53 y.o. 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  MRI does not show significant osteoarthritis and given that I do believe he would benefit from a  VY lengthening of the quadriceps with the patella tendon reconstruction with allograft Achilles.   PAIN:  Are you having pain? Yes: NPRS scale: 7 Pain location: R knee Pain description: ache Aggravating factors: Worst at night Relieving factors: Ice  PRECAUTIONS: Knee,   RED FLAGS: None   WEIGHT BEARING RESTRICTIONS: Yes WBAT with brace and crutches  FALLS:  Has patient fallen in last 6 months? No  LIVING ENVIRONMENT: Lives with: lives with their family Lives in: House/apartment Stairs: No Has following equipment at home: None  OCCUPATION: On disability  PLOF: Independent  PATIENT GOALS: Patient would like to recover his ability to walk around normally.  NEXT MD VISIT: about 2 weeks  OBJECTIVE:  Note: Objective measures were completed at Evaluation unless otherwise noted.  DIAGNOSTIC FINDINGS:  03/03/23 Xray (4 views right knee): Evidence of an old patella tendon avulsion with significant retraction and osteoarthritis of the patella   MRI right knee: There is full-thickness chronic patella tendon rupture without significant patellofemoral cartilage loss  COGNITION: Overall cognitive status: Within functional limits for tasks assessed     SENSATION: WFL  EDEMA:  None observed  MUSCLE LENGTH: Hamstrings: WNL  POSTURE: Stands with weight shifted to LLE  PALPATION: Patient's dressings prevent full observation of knee, gentle patellar mobs performed, no pain  LOWER EXTREMITY ROM: LLE WNL  Passive ROM  Right eval Left eval  Hip flexion    Hip extension    Hip abduction    Hip adduction    Hip internal rotation    Hip external rotation    Knee flexion 45   Knee extension 0   Ankle dorsiflexion 0   Ankle plantarflexion 52   Ankle inversion    Ankle eversion     (Blank rows = not tested)  LOWER EXTREMITY MMT: LLE 5/5, R hip at least 4/5, knee- active quad set- weak, ankle WNL  LOWER EXTREMITY SPECIAL TESTS:  N/T  FUNCTIONAL TESTS:   N/T  GAIT: Distance walked: In clinic distances Assistive device utilized: None Level of assistance: Modified independence Comments: Patient reports he did not receive crutches after surgery. Later reports that his girlfriend has crutches he can adjust. He had this same surgery on LLE about 20 years ago, reports he can safely use crutches, declined to practice in the clinic.                                                                                                                              TREATMENT DATE:  03/06/23 Education Quad sets, ankle pumps, HS to 45 degrees, SLR with max A Gentle patellar mobs  PATIENT EDUCATION:  Education details: POC, HEP Person educated: Patient Education method: Explanation Education comprehension: verbalized understanding and returned demonstration  HOME EXERCISE PROGRAM: [id number]  ASSESSMENT:  CLINICAL IMPRESSION: Patient is a 53 y.o. who was seen today for physical therapy evaluation and treatment for recover from R patellar tendon lengthening and repair. He is 3 days post op today. Arrived with no crutches, reports he will obtain some today, has used them in the past and is comfortable using them, declined education. Following protocol, initiated HEP, practiced and gave him written instructions. Plan to see him x 1 next week and then increase to 2x/week beginning the next week when his rehab expands, per protocol. He will benefit from PT as he progresses through his reha, for supervised progression of strengthening and ROM in order to maximize his recovery and promote return to his PLOF or beyond.   OBJECTIVE IMPAIRMENTS: Abnormal gait, decreased activity tolerance, decreased balance, decreased coordination, difficulty walking, decreased ROM, decreased strength, impaired flexibility, improper body mechanics, postural dysfunction, and pain.   ACTIVITY LIMITATIONS: carrying, lifting, bending, sitting, standing, squatting, sleeping, stairs,  transfers, bathing, toileting, dressing, and locomotion level  PARTICIPATION LIMITATIONS: meal prep, cleaning, laundry, driving, shopping, and community activity  PERSONAL FACTORS: Past/current experiences are also affecting patient's functional outcome.   REHAB POTENTIAL: Good  CLINICAL DECISION MAKING: Evolving/moderate complexity  EVALUATION COMPLEXITY: Low   GOALS: Goals reviewed with patient? Yes  SHORT TERM GOALS: Target date: 03/21/23 I with initial HEP Baseline: Goal status: INITIAL  LONG TERM GOALS: Target date: 05/29/23  I with final HEP Baseline:  Goal status: INITIAL  2.  Patient will recover R knee AROM to at least 0-120 degrees Baseline: 0-45  Goal status: INITIAL  3.  Patient will recover R knee strength to 5/5 Baseline: Active, but weak quad contraction achieved. Goal status: INITIAL  4.  Patient will perform dynamic SLS activities on RLE on unstable surface with no LOB Baseline:  Goal status: INITIAL  5.  Patient will be able to walk x at least 1000' on all surfaces, including up and down steps, I, no gait abnormalities Baseline:  Goal status: INITIAL  6.  Patient will be able to perform dynamic, power moves such as jumping, hopping, agility ladder drills with no pain or instability in R knee. Baseline:  Goal status: INITIAL   PLAN:  PT FREQUENCY: 1-2x/week  PT DURATION: 12 weeks  PLANNED INTERVENTIONS: 97110-Therapeutic exercises, 97530- Therapeutic activity, [id number]- Neuromuscular re-education, 97535- Self Care, 16109- Manual therapy, [id number]- Gait training, 97014- Electrical stimulation (unattended), 97016- Vasopneumatic device, Patient/Family education, Balance training, Stair training, Taping, Dry Needling, Joint mobilization, Cryotherapy, and Moist heat  PLAN FOR NEXT SESSION: Proceed per protocol.   Iona Beard, DPT 03/06/2023, 8:57 AM

## 2023-03-05 NOTE — Assessment & Plan Note (Signed)
Blood pressure is at goal in office today.  He continues with medications as prescribed including amlodipine, hydralazine, Imdur, labetalol.  No current issues with chest pain or headaches.  Last appointment with cardiology was in November 2024 with recommendation for 58-month follow-up. Given good blood pressure control, can continue with current medication regimen, no changes needed at this time.  Recommend follow-up with cardiology as scheduled

## 2023-03-05 NOTE — Patient Instructions (Signed)

## 2023-03-06 ENCOUNTER — Ambulatory Visit: Payer: 59 | Attending: Orthopaedic Surgery | Admitting: Physical Therapy

## 2023-03-06 ENCOUNTER — Encounter: Payer: Self-pay | Admitting: Physical Therapy

## 2023-03-06 DIAGNOSIS — S86811A Strain of other muscle(s) and tendon(s) at lower leg level, right leg, initial encounter: Secondary | ICD-10-CM | POA: Insufficient documentation

## 2023-03-06 DIAGNOSIS — R2681 Unsteadiness on feet: Secondary | ICD-10-CM | POA: Diagnosis not present

## 2023-03-06 DIAGNOSIS — R2689 Other abnormalities of gait and mobility: Secondary | ICD-10-CM | POA: Insufficient documentation

## 2023-03-06 DIAGNOSIS — R600 Localized edema: Secondary | ICD-10-CM | POA: Diagnosis not present

## 2023-03-06 DIAGNOSIS — M25661 Stiffness of right knee, not elsewhere classified: Secondary | ICD-10-CM | POA: Insufficient documentation

## 2023-03-06 DIAGNOSIS — M6281 Muscle weakness (generalized): Secondary | ICD-10-CM | POA: Insufficient documentation

## 2023-03-07 ENCOUNTER — Encounter (HOSPITAL_COMMUNITY): Payer: Self-pay | Admitting: Orthopaedic Surgery

## 2023-03-07 DIAGNOSIS — N2581 Secondary hyperparathyroidism of renal origin: Secondary | ICD-10-CM | POA: Diagnosis not present

## 2023-03-07 DIAGNOSIS — D631 Anemia in chronic kidney disease: Secondary | ICD-10-CM | POA: Diagnosis not present

## 2023-03-07 DIAGNOSIS — E1122 Type 2 diabetes mellitus with diabetic chronic kidney disease: Secondary | ICD-10-CM | POA: Diagnosis not present

## 2023-03-07 DIAGNOSIS — N186 End stage renal disease: Secondary | ICD-10-CM | POA: Diagnosis not present

## 2023-03-07 DIAGNOSIS — Z992 Dependence on renal dialysis: Secondary | ICD-10-CM | POA: Diagnosis not present

## 2023-03-07 DIAGNOSIS — R52 Pain, unspecified: Secondary | ICD-10-CM | POA: Diagnosis not present

## 2023-03-07 DIAGNOSIS — D689 Coagulation defect, unspecified: Secondary | ICD-10-CM | POA: Diagnosis not present

## 2023-03-10 ENCOUNTER — Encounter (HOSPITAL_COMMUNITY): Payer: Self-pay | Admitting: Vascular Surgery

## 2023-03-10 ENCOUNTER — Other Ambulatory Visit: Payer: Self-pay

## 2023-03-10 DIAGNOSIS — D689 Coagulation defect, unspecified: Secondary | ICD-10-CM | POA: Diagnosis not present

## 2023-03-10 DIAGNOSIS — E1122 Type 2 diabetes mellitus with diabetic chronic kidney disease: Secondary | ICD-10-CM | POA: Diagnosis not present

## 2023-03-10 DIAGNOSIS — N186 End stage renal disease: Secondary | ICD-10-CM | POA: Diagnosis not present

## 2023-03-10 DIAGNOSIS — N2581 Secondary hyperparathyroidism of renal origin: Secondary | ICD-10-CM | POA: Diagnosis not present

## 2023-03-10 DIAGNOSIS — Z992 Dependence on renal dialysis: Secondary | ICD-10-CM | POA: Diagnosis not present

## 2023-03-10 DIAGNOSIS — D631 Anemia in chronic kidney disease: Secondary | ICD-10-CM | POA: Diagnosis not present

## 2023-03-10 NOTE — Progress Notes (Addendum)
PCP - Dr. Ceasar Mons [place] Cardiologist - Dr. Epifanio Lesches  PPM/ICD - denies   Chest x-ray - 07/08/21 EKG - 08/12/22 Stress Test - 11/18/22 ECHO - 09/09/22 Cardiac Cath - denies  CPAP - denies  Fasting Blood Sugar - 90-100 Checks Blood Sugar continuously/day  ASA/Blood Thinner Instructions: n/a   ERAS Protcol - no, NPO  COVID TEST- n/a  Anesthesia review: yes  Patient verbally denies any shortness of breath, fever, cough and chest pain during phone call  Pt states he will not be able to be here until 0915. SS Care coordinator, Michel,made aware.    Questions were answered. Patient verbalized understanding of instructions.

## 2023-03-10 NOTE — Pre-Procedure Instructions (Addendum)
-------------  SDW INSTRUCTIONS given:  Your procedure is scheduled on 2/4.  Report to [facility] Main Entrance "A" at 09:00 A.M., and check in at the Admitting office.  Any questions or running late day of surgery: call [phone]    Remember:  Do not eat or drink after midnight the night before your surgery      Take these medicines the morning of surgery with A SIP OF WATER  amlodipine, lipitor, hydralazine, imdur, labetalol, roxicodone PRN, protonix     As of today, STOP taking any Aspirin (unless otherwise instructed by your surgeon) Aleve, Naproxen, Ibuprofen, Motrin, Advil, Goody's, BC's, all herbal medications, fish oil, and all vitamins.  WHAT DO I DO ABOUT MY DIABETES MEDICATION?   Hold Empagliflozin-linaGLIPtin 72 hours (pt was not told this, but will hold prior to surgery)  THE NIGHT BEFORE SURGERY, take 4 units (50%) of insulin glargine (LANTUS SOLOSTAR)          HOW TO MANAGE YOUR DIABETES BEFORE AND AFTER SURGERY  Why is it important to control my blood sugar before and after surgery? Improving blood sugar levels before and after surgery helps healing and can limit problems. A way of improving blood sugar control is eating a healthy diet by:  Eating less sugar and carbohydrates  Increasing activity/exercise  Talking with your doctor about reaching your blood sugar goals High blood sugars (greater than 180 mg/dL) can raise your risk of infections and slow your recovery, so you will need to focus on controlling your diabetes during the weeks before surgery. Make sure that the doctor who takes care of your diabetes knows about your planned surgery including the date and location.  How do I manage my blood sugar before surgery? Check your blood sugar at least 4 times a day, starting 2 days before surgery, to make sure that the level is not too high or low.  Check your blood sugar the morning of your surgery when you wake up and every 2 hours until you get to  the Short Stay unit.  If your blood sugar is less than 70 mg/dL, you will need to treat for low blood sugar: Do not take insulin. Treat a low blood sugar (less than 70 mg/dL) with  cup of clear juice (cranberry or apple), 4 glucose tablets, OR glucose gel. Recheck blood sugar in 15 minutes after treatment (to make sure it is greater than 70 mg/dL). If your blood sugar is not greater than 70 mg/dL on recheck, call [phone] for further instructions. Report your blood sugar to the short stay nurse when you get to Short Stay.  If you are admitted to the hospital after surgery: Your blood sugar will be checked by the staff and you will probably be given insulin after surgery (instead of oral diabetes medicines) to make sure you have good blood sugar levels. The goal for blood sugar control after surgery is 80-180 mg/dL.   Do NOT Smoke (Tobacco/Vaping) 24 hours prior to your procedure  If you use a CPAP at night, you may bring all equipment for your overnight stay.     You will be asked to remove any contacts, glasses, piercing's, hearing aid's, dentures/partials prior to surgery. Please bring cases for these items if needed.     Patients discharged the day of surgery will not be allowed to drive home, and someone needs to stay with them for 24 hours.  SURGICAL WAITING ROOM VISITATION Patients may have no more than 2 support people in  the waiting area - these visitors may rotate.   Pre-op nurse will coordinate an appropriate time for 1 ADULT support person, who may not rotate, to accompany patient in pre-op.  Children under the age of 46 must have an adult with them who is not the patient and must remain in the main waiting area with an adult.  If the patient needs to stay at the hospital during part of their recovery, the visitor guidelines for inpatient rooms apply.  Please refer to the [facility] website for the visitor guidelines for any additional information.   Special  instructions:   [facility]- Preparing For Surgery   Please follow these instructions carefully.   Shower the NIGHT BEFORE SURGERY and the MORNING OF SURGERY with DIAL Soap.   Pat yourself dry with a CLEAN TOWEL.  Wear CLEAN PAJAMAS to bed the night before surgery  Place CLEAN SHEETS on your bed the night of your first shower and DO NOT SLEEP WITH PETS.   Additional instructions for the day of surgery: DO NOT APPLY any lotions, deodorants, cologne, or perfumes.   Do not wear jewelry or makeup Do not wear nail polish, gel polish, artificial nails, or any other type of covering on natural nails (fingers and toes) Do not bring valuables to the hospital. [facility] is not responsible for valuables/personal belongings. Put on clean/comfortable clothes.  Please brush your teeth.  Ask your nurse before applying any prescription medications to the skin.

## 2023-03-10 NOTE — Anesthesia Preprocedure Evaluation (Signed)
Anesthesia Evaluation  Patient identified by MRN, date of birth, ID band Patient awake    Reviewed: Allergy & Precautions, H&P , NPO status , Patient's Chart, lab work & pertinent test results, reviewed documented beta blocker date and time   Airway Mallampati: II  TM Distance: >3 FB Neck ROM: Full    Dental  (+) Poor Dentition, Dental Advisory Given, Edentulous Upper, Missing,    Pulmonary neg pulmonary ROS   Pulmonary exam normal        Cardiovascular hypertension, Pt. on medications and Pt. on home beta blockers +CHF (normal LVEF, grade 2 diastolic dysfunction)  Normal cardiovascular exam   '24 TTE - EF 60 to 65%. There is mild left ventricular hypertrophy. Grade II diastolic dysfunction (pseudonormalization). There is mildly elevated pulmonary artery systolic pressure. The estimated right ventricular systolic pressure is 41.9 mmHg. Left atrial size was moderately dilated. Right atrial size was moderately dilated. Aortic dilatation noted. There is borderline dilatation of the ascending aorta, measuring 38 mm. No significant valvular d/o identified.     Neuro/Psych  Blindness left eye   negative psych ROS   GI/Hepatic Neg liver ROS,GERD  Medicated and Controlled,,  Endo/Other  diabetes, Type 2, Insulin Dependent, Oral Hypoglycemic Agents  Hyperlipidemia  Renal/GU ESRF and DialysisRenal disease (MWF)     Musculoskeletal negative musculoskeletal ROS (+)    Abdominal   Peds negative pediatric ROS (+)  Hematology  (+) Blood dyscrasia, anemia   Anesthesia Other Findings   Reproductive/Obstetrics                             Anesthesia Physical Anesthesia Plan  ASA: 3  Anesthesia Plan: General   Post-op Pain Management: Tylenol PO (pre-op)*   Induction: Intravenous  PONV Risk Score and Plan: 2 and Ondansetron, Dexamethasone, Midazolam and Treatment may vary due to age or medical  condition  Airway Management Planned: LMA  Additional Equipment: None  Intra-op Plan:   Post-operative Plan: Extubation in OR  Informed Consent: I have reviewed the patients History and Physical, chart, labs and discussed the procedure including the risks, benefits and alternatives for the proposed anesthesia with the patient or authorized representative who has indicated his/her understanding and acceptance.     Dental advisory given  Plan Discussed with: Anesthesiologist and CRNA  Anesthesia Plan Comments: (PAT note by Antionette Poles, PA-C 02/25/23. )        Anesthesia Quick Evaluation

## 2023-03-11 ENCOUNTER — Other Ambulatory Visit: Payer: Self-pay

## 2023-03-11 ENCOUNTER — Other Ambulatory Visit (HOSPITAL_COMMUNITY): Payer: Self-pay

## 2023-03-11 ENCOUNTER — Ambulatory Visit (HOSPITAL_COMMUNITY)
Admission: RE | Admit: 2023-03-11 | Discharge: 2023-03-11 | Disposition: A | Discharge status: Home / Self Care | Payer: 59 | Attending: Vascular Surgery | Admitting: Vascular Surgery

## 2023-03-11 ENCOUNTER — Ambulatory Visit (HOSPITAL_COMMUNITY): Payer: 59 | Admitting: Physician Assistant

## 2023-03-11 ENCOUNTER — Encounter (HOSPITAL_COMMUNITY): Payer: Self-pay | Admitting: Vascular Surgery

## 2023-03-11 ENCOUNTER — Ambulatory Visit: Payer: 59 | Attending: Orthopaedic Surgery | Admitting: Physical Therapy

## 2023-03-11 ENCOUNTER — Ambulatory Visit (HOSPITAL_BASED_OUTPATIENT_CLINIC_OR_DEPARTMENT_OTHER): Payer: 59 | Admitting: Physician Assistant

## 2023-03-11 ENCOUNTER — Encounter (HOSPITAL_COMMUNITY)
Admission: RE | Disposition: A | Discharge status: Home / Self Care | Payer: Self-pay | Source: Home / Self Care | Attending: Vascular Surgery

## 2023-03-11 DIAGNOSIS — Y832 Surgical operation with anastomosis, bypass or graft as the cause of abnormal reaction of the patient, or of later complication, without mention of misadventure at the time of the procedure: Secondary | ICD-10-CM | POA: Diagnosis not present

## 2023-03-11 DIAGNOSIS — K219 Gastro-esophageal reflux disease without esophagitis: Secondary | ICD-10-CM | POA: Insufficient documentation

## 2023-03-11 DIAGNOSIS — M25661 Stiffness of right knee, not elsewhere classified: Secondary | ICD-10-CM | POA: Insufficient documentation

## 2023-03-11 DIAGNOSIS — E1122 Type 2 diabetes mellitus with diabetic chronic kidney disease: Secondary | ICD-10-CM

## 2023-03-11 DIAGNOSIS — I132 Hypertensive heart and chronic kidney disease with heart failure and with stage 5 chronic kidney disease, or end stage renal disease: Secondary | ICD-10-CM | POA: Insufficient documentation

## 2023-03-11 DIAGNOSIS — M6281 Muscle weakness (generalized): Secondary | ICD-10-CM | POA: Diagnosis not present

## 2023-03-11 DIAGNOSIS — T82898A Other specified complication of vascular prosthetic devices, implants and grafts, initial encounter: Secondary | ICD-10-CM | POA: Insufficient documentation

## 2023-03-11 DIAGNOSIS — I509 Heart failure, unspecified: Secondary | ICD-10-CM | POA: Insufficient documentation

## 2023-03-11 DIAGNOSIS — Z7984 Long term (current) use of oral hypoglycemic drugs: Secondary | ICD-10-CM | POA: Insufficient documentation

## 2023-03-11 DIAGNOSIS — H5462 Unqualified visual loss, left eye, normal vision right eye: Secondary | ICD-10-CM | POA: Insufficient documentation

## 2023-03-11 DIAGNOSIS — R2689 Other abnormalities of gait and mobility: Secondary | ICD-10-CM | POA: Diagnosis not present

## 2023-03-11 DIAGNOSIS — Z794 Long term (current) use of insulin: Secondary | ICD-10-CM | POA: Diagnosis not present

## 2023-03-11 DIAGNOSIS — E785 Hyperlipidemia, unspecified: Secondary | ICD-10-CM | POA: Insufficient documentation

## 2023-03-11 DIAGNOSIS — R600 Localized edema: Secondary | ICD-10-CM | POA: Insufficient documentation

## 2023-03-11 DIAGNOSIS — D631 Anemia in chronic kidney disease: Secondary | ICD-10-CM | POA: Diagnosis not present

## 2023-03-11 DIAGNOSIS — Z992 Dependence on renal dialysis: Secondary | ICD-10-CM | POA: Diagnosis not present

## 2023-03-11 DIAGNOSIS — N186 End stage renal disease: Secondary | ICD-10-CM

## 2023-03-11 DIAGNOSIS — R2681 Unsteadiness on feet: Secondary | ICD-10-CM | POA: Diagnosis not present

## 2023-03-11 DIAGNOSIS — I5032 Chronic diastolic (congestive) heart failure: Secondary | ICD-10-CM | POA: Diagnosis not present

## 2023-03-11 HISTORY — PX: LIGATION OF COMPETING BRANCHES OF ARTERIOVENOUS FISTULA: SHX5949

## 2023-03-11 LAB — POCT I-STAT, CHEM 8
BUN: 35 mg/dL — ABNORMAL HIGH (ref 6–20)
Calcium, Ion: 1.07 mmol/L — ABNORMAL LOW (ref 1.15–1.40)
Chloride: 101 mmol/L (ref 98–111)
Creatinine, Ser: 11.9 mg/dL — ABNORMAL HIGH (ref 0.61–1.24)
Glucose, Bld: 131 mg/dL — ABNORMAL HIGH (ref 70–99)
HCT: 33 % — ABNORMAL LOW (ref 39.0–52.0)
Hemoglobin: 11.2 g/dL — ABNORMAL LOW (ref 13.0–17.0)
Potassium: 3.9 mmol/L (ref 3.5–5.1)
Sodium: 138 mmol/L (ref 135–145)
TCO2: 25 mmol/L (ref 22–32)

## 2023-03-11 LAB — GLUCOSE, CAPILLARY
Glucose-Capillary: 132 mg/dL — ABNORMAL HIGH (ref 70–99)
Glucose-Capillary: 120 mg/dL — ABNORMAL HIGH (ref 70–99)

## 2023-03-11 SURGERY — LIGATION OF COMPETING BRANCHES OF ARTERIOVENOUS FISTULA
Anesthesia: General | Laterality: Left

## 2023-03-11 MED ORDER — FENTANYL CITRATE (PF) 100 MCG/2ML IJ SOLN: 25.0000 ug | INTRAMUSCULAR | Status: DC | PRN

## 2023-03-11 MED ORDER — PHENYLEPHRINE 80 MCG/ML (10ML) SYRINGE FOR IV PUSH (FOR BLOOD PRESSURE SUPPORT)
PREFILLED_SYRINGE | INTRAVENOUS | Status: DC | PRN
  Administered 2023-03-11 (×2): 160 ug via INTRAVENOUS
  Administered 2023-03-11: 80 ug via INTRAVENOUS

## 2023-03-11 MED ORDER — CHLORHEXIDINE GLUCONATE 0.12 % MT SOLN
OROMUCOSAL | Status: AC
  Administered 2023-03-11: 15 mL
  Filled 2023-03-11: qty 15

## 2023-03-11 MED ORDER — ONDANSETRON HCL 4 MG/2ML IJ SOLN: 4.0000 mg | Freq: Once | INTRAMUSCULAR | Status: DC | PRN

## 2023-03-11 MED ORDER — OXYCODONE-ACETAMINOPHEN 5-325 MG PO TABS
1.0000 | ORAL_TABLET | Freq: Four times a day (QID) | ORAL | 0 refills | Status: DC | PRN
  Filled 2023-03-11: qty 12, 3d supply, fill #0

## 2023-03-11 MED ORDER — PROPOFOL 10 MG/ML IV BOLUS
INTRAVENOUS | Status: DC | PRN
  Administered 2023-03-11: 100 mg via INTRAVENOUS

## 2023-03-11 MED ORDER — CHLORHEXIDINE GLUCONATE 4 % EX SOLN: 60.0000 mL | Freq: Once | CUTANEOUS | Status: DC

## 2023-03-11 MED ORDER — LABETALOL HCL 200 MG PO TABS
200.0000 mg | ORAL_TABLET | Freq: Once | ORAL | Status: AC
  Administered 2023-03-11: 200 mg via ORAL
  Filled 2023-03-11 (×2): qty 1

## 2023-03-11 MED ORDER — CEFAZOLIN SODIUM-DEXTROSE 2-4 GM/100ML-% IV SOLN
2.0000 g | INTRAVENOUS | Status: AC
  Administered 2023-03-11: 2 g via INTRAVENOUS
  Filled 2023-03-11: qty 100

## 2023-03-11 MED ORDER — FENTANYL CITRATE (PF) 250 MCG/5ML IJ SOLN
INTRAMUSCULAR | Status: DC | PRN
  Administered 2023-03-11 (×2): 50 ug via INTRAVENOUS

## 2023-03-11 MED ORDER — 0.9 % SODIUM CHLORIDE (POUR BTL) OPTIME
TOPICAL | Status: DC | PRN
  Administered 2023-03-11: 1000 mL

## 2023-03-11 MED ORDER — EPHEDRINE SULFATE-NACL 50-0.9 MG/10ML-% IV SOSY
PREFILLED_SYRINGE | INTRAVENOUS | Status: DC | PRN
  Administered 2023-03-11: 7.5 mg via INTRAVENOUS

## 2023-03-11 MED ORDER — PROPOFOL 10 MG/ML IV BOLUS
INTRAVENOUS | Status: AC
  Filled 2023-03-11: qty 20

## 2023-03-11 MED ORDER — HEPARIN 6000 UNIT IRRIGATION SOLUTION
Status: AC
  Filled 2023-03-11: qty 500

## 2023-03-11 MED ORDER — OXYCODONE HCL 5 MG PO TABS
5.0000 mg | ORAL_TABLET | Freq: Once | ORAL | Status: AC | PRN
Start: 2023-03-11 — End: 2023-03-11

## 2023-03-11 MED ORDER — MIDAZOLAM HCL 2 MG/2ML IJ SOLN
INTRAMUSCULAR | Status: AC
  Filled 2023-03-11: qty 2

## 2023-03-11 MED ORDER — MIDAZOLAM HCL 2 MG/2ML IJ SOLN
INTRAMUSCULAR | Status: DC | PRN
  Administered 2023-03-11: 2 mg via INTRAVENOUS

## 2023-03-11 MED ORDER — OXYCODONE HCL 5 MG/5ML PO SOLN
ORAL | Status: AC
  Filled 2023-03-11: qty 5

## 2023-03-11 MED ORDER — SODIUM CHLORIDE 0.9 % IV SOLN: INTRAVENOUS | Status: DC

## 2023-03-11 MED ORDER — PHENYLEPHRINE HCL-NACL 20-0.9 MG/250ML-% IV SOLN
INTRAVENOUS | Status: DC | PRN
  Administered 2023-03-11: 40 ug/min via INTRAVENOUS

## 2023-03-11 MED ORDER — OXYCODONE HCL 5 MG/5ML PO SOLN
5.0000 mg | Freq: Once | ORAL | Status: AC | PRN
  Administered 2023-03-11: 5 mg via ORAL

## 2023-03-11 MED ORDER — DEXAMETHASONE SODIUM PHOSPHATE 10 MG/ML IJ SOLN
INTRAMUSCULAR | Status: DC | PRN
  Administered 2023-03-11: 5 mg via INTRAVENOUS

## 2023-03-11 MED ORDER — LIDOCAINE-EPINEPHRINE (PF) 1 %-1:200000 IJ SOLN
INTRAMUSCULAR | Status: DC | PRN
  Administered 2023-03-11: 3 mL

## 2023-03-11 MED ORDER — ONDANSETRON HCL 4 MG/2ML IJ SOLN
INTRAMUSCULAR | Status: DC | PRN
  Administered 2023-03-11: 4 mg via INTRAVENOUS

## 2023-03-11 MED ORDER — FENTANYL CITRATE (PF) 250 MCG/5ML IJ SOLN
INTRAMUSCULAR | Status: AC
  Filled 2023-03-11: qty 5

## 2023-03-11 MED ORDER — LIDOCAINE 2% (20 MG/ML) 5 ML SYRINGE
INTRAMUSCULAR | Status: DC | PRN
  Administered 2023-03-11: 80 mg via INTRAVENOUS

## 2023-03-11 SURGICAL SUPPLY — 24 items
BAG COUNTER SPONGE SURGICOUNT (BAG) ×1 IMPLANT
CANISTER SUCT 3000ML PPV (MISCELLANEOUS) ×1 IMPLANT
CLIP TI MEDIUM 6 (CLIP) ×1 IMPLANT
CLIP TI WIDE RED SMALL 6 (CLIP) ×1 IMPLANT
COVER PROBE W GEL 5X96 (DRAPES) IMPLANT
DERMABOND ADVANCED .7 DNX12 (GAUZE/BANDAGES/DRESSINGS) ×1 IMPLANT
ELECT REM PT RETURN 9FT ADLT (ELECTROSURGICAL) ×1 IMPLANT
ELECTRODE REM PT RTRN 9FT ADLT (ELECTROSURGICAL) ×1 IMPLANT
GLOVE BIOGEL PI IND STRL 7.0 (GLOVE) ×1 IMPLANT
GOWN STRL REUS W/ TWL LRG LVL3 (GOWN DISPOSABLE) ×2 IMPLANT
GOWN STRL REUS W/ TWL XL LVL3 (GOWN DISPOSABLE) ×1 IMPLANT
KIT BASIN OR (CUSTOM PROCEDURE TRAY) ×1 IMPLANT
KIT TURNOVER KIT B (KITS) IMPLANT
NS IRRIG 1000ML POUR BTL (IV SOLUTION) ×1 IMPLANT
PACK CV ACCESS (CUSTOM PROCEDURE TRAY) ×1 IMPLANT
PAD ARMBOARD 7.5X6 YLW CONV (MISCELLANEOUS) ×2 IMPLANT
POWDER SURGICEL 3.0 GRAM (HEMOSTASIS) IMPLANT
SUT MNCRL AB 4-0 PS2 18 (SUTURE) ×1 IMPLANT
SUT PROLENE 6 0 BV (SUTURE) IMPLANT
SUT SILK 0 TIES 10X30 (SUTURE) ×1 IMPLANT
SUT VIC AB 3-0 SH 27X BRD (SUTURE) ×1 IMPLANT
TOWEL GREEN STERILE (TOWEL DISPOSABLE) ×1 IMPLANT
UNDERPAD 30X36 HEAVY ABSORB (UNDERPADS AND DIAPERS) ×1 IMPLANT
WATER STERILE IRR 1000ML POUR (IV SOLUTION) ×1 IMPLANT

## 2023-03-11 NOTE — Interval H&P Note (Signed)
 History and Physical Interval Note:  03/11/2023 11:36 AM  Timothy [name]  has presented today for surgery, with the diagnosis of ESRD.  The various methods of treatment have been discussed with the patient and family. After consideration of risks, benefits and other options for treatment, the patient has consented to  Procedure(s): LIGATION OF COMPETING BRANCHES OF LEFT ARTERIOVENOUS FISTULA (Left) as a surgical intervention.  The patient's history has been reviewed, patient examined, no change in status, stable for surgery.  I have reviewed the patient's chart and labs.  Questions were answered to the patient's satisfaction.     Norman GORMAN Serve

## 2023-03-11 NOTE — Discharge Instructions (Signed)

## 2023-03-11 NOTE — Anesthesia Postprocedure Evaluation (Signed)
 Anesthesia Post Note  Patient: Timothy [name]  Procedure(s) Performed: LIGATION OF COMPETING BRANCHES OF LEFT ARTERIOVENOUS FISTULA (Left)     Patient location during evaluation: PACU Anesthesia Type: General Level of consciousness: awake and alert Pain management: pain level controlled Vital Signs Assessment: post-procedure vital signs reviewed and stable Respiratory status: spontaneous breathing, nonlabored ventilation and respiratory function stable Cardiovascular status: stable and blood pressure returned to baseline Anesthetic complications: no   No notable events documented.  Last Vitals:  Vitals:   03/11/23 1330 03/11/23 1343  BP: 123/71 132/78  Pulse: 65 63  Resp: 14 14  Temp:  36.9 C  SpO2: 91% 94%    Last Pain:  Vitals:   03/11/23 1330  TempSrc:   PainSc: Asleep                 Debby FORBES Like

## 2023-03-11 NOTE — Op Note (Signed)
    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  The assistant provided traction and countertraction to assist with exposure of the fistula.  They also assisted with ligation of the venous branches.    ANESTHESIA: general, LMA  ESTIMATED BLOOD LOSS: Minimal  FINDING(S): 3 moderately sized sidebranches. Palpable thrill in AV fistula on completion  SPECIMEN(S): None  INDICATIONS:   Timothy [name] is a 53 y.o. male with with ESRD on HD who underwent a left brachiocephalic fistula creation with Dr. Serene in August 2024.  He later underwent fistulogram in October which noted multiple side branches that could potentially be stealing flow causing slow maturation.  Risks and benefits of ligation of side branches were reviewed, patient expressed understanding and elected to proceed.  DESCRIPTION: The patient was brought to the operating room positioned supine on operating room table.  Anesthesia was induced and the left arm was prepped and draped in the usual sterile fashion.  A timeout was performed and preoperative antibiotics were given.  We started by mapping the left upper extremity fistula from the anastomosis to the shoulder and marked 3 venous side branches.  2 small transverse incisions were made overlying these sidebranches.  The incisions were carried deeper via electrocautery until the anterior surface of the fistula was encountered and the sidebranch noted.  The branches were dissected circumferentially and then ligated and transected between silk ties.  There was a  palpable thrill in the fistula after ligation of all 3 side branches.  The 2 incisions were irrigated and then closed in layers with 3-0 Vicryl and 4 Monocryl for the skin. All counts were correct at the end of the procedure and the patient was brought to recovery in stable condition.    COMPLICATIONS: None apparent  CONDITION: Good  Norman GORMAN Serve MD [facility] Phone Number: [phone] 03/11/2023 1:16 PM

## 2023-03-11 NOTE — Transfer of Care (Signed)
 Immediate Anesthesia Transfer of Care Note  Patient: Timothy [name]  Procedure(s) Performed: LIGATION OF COMPETING BRANCHES OF LEFT ARTERIOVENOUS FISTULA (Left)  Patient Location: PACU  Anesthesia Type:General  Level of Consciousness: drowsy  Airway & Oxygen Therapy: Patient Spontanous Breathing and Patient connected to face mask oxygen  Post-op Assessment: Report given to RN and Post -op Vital signs reviewed and stable  Post vital signs: Reviewed and stable  Last Vitals:  Vitals Value Taken Time  BP 133/76 03/11/23 1311  Temp    Pulse 65 03/11/23 1312  Resp 17 03/11/23 1312  SpO2 100 % 03/11/23 1312  Vitals shown include unfiled device data.  Last Pain:  Vitals:   03/11/23 1010  TempSrc:   PainSc: 6       Patients Stated Pain Goal: 2 (03/11/23 1010)  Complications: No notable events documented.

## 2023-03-11 NOTE — Anesthesia Procedure Notes (Signed)
 Procedure Name: LMA Insertion Date/Time: 03/11/2023 12:18 PM  Performed by: Vinita Geanie LABOR, CRNAPre-anesthesia Checklist: Patient identified Patient Re-evaluated:Patient Re-evaluated prior to induction Oxygen Delivery Method: Circle system utilized Preoxygenation: Pre-oxygenation with 100% oxygen Induction Type: IV induction LMA: LMA inserted LMA Size: 4.0 Number of attempts: 1 Placement Confirmation: breath sounds checked- equal and bilateral and positive ETCO2 Tube secured with: Tape Dental Injury: Teeth and Oropharynx as per pre-operative assessment

## 2023-03-11 NOTE — Therapy (Signed)
 OUTPATIENT PHYSICAL THERAPY LOWER EXTREMITY    Patient Name: Timothy [name] MRN: [id number] DOB:[birth year], 53 y.o., male Today's Date: 03/11/2023  END OF SESSION:  PT End of Session - 03/11/23 0749     Visit Number 2    Date for PT Re-Evaluation 05/29/23    PT Start Time 0752    PT Stop Time 0830    PT Time Calculation (min) 38 min             Past Medical History:  Diagnosis Date   Allergies    Anemia    Blind left eye    CHF (congestive heart failure) (HCC)    Chronic kidney disease    ESRD - HD M,W,F   Diabetes mellitus without complication (HCC)    Type2   Dyspnea    Patient denies SOB since he started dialysis. States he went away after he started dialysis   HLD (hyperlipidemia)    Hypertension    Pneumonia    x2   Past Surgical History:  Procedure Laterality Date   A/V FISTULAGRAM Left 11/19/2022   Procedure: A/V Fistulagram;  Surgeon: Serene Gaile ORN, MD;  Location: [facility];  Service: Cardiovascular;  Laterality: Left;   AV FISTULA PLACEMENT Left 09/19/2022   Procedure: ARTERIOVENOUS (AV) FISTULA CREATION;  Surgeon: Serene Gaile ORN, MD;  Location: [facility] OR;  Service: Vascular;  Laterality: Left;   BIOPSY  12/05/2022   Procedure: BIOPSY;  Surgeon: Shila Gustav GAILS, MD;  Location: [facility];  Service: Gastroenterology;;   CATARACT EXTRACTION Right    COLONOSCOPY WITH PROPOFOL  N/A 12/05/2022   Procedure: COLONOSCOPY WITH PROPOFOL ;  Surgeon: Shila Gustav GAILS, MD;  Location: [facility];  Service: Gastroenterology;  Laterality: N/A;   ESOPHAGOGASTRODUODENOSCOPY (EGD) WITH PROPOFOL  N/A 12/05/2022   Procedure: ESOPHAGOGASTRODUODENOSCOPY (EGD) WITH PROPOFOL ;  Surgeon: Shila Gustav GAILS, MD;  Location: [facility];  Service: Gastroenterology;  Laterality: N/A;   FRACTURE SURGERY Left    [place]   KNEE RECONSTRUCTION Right 03/03/2023   Procedure: RIGHT KNEE QUADRICEPS TENDON LENGTHENING AND PATELLA TENDON RECONSTRUCTION WITH ACHILLES  ALLOGRAFT;  Surgeon: Genelle Standing, MD;  Location: [facility];  Service: Orthopedics;  Laterality: Right;   MENISCUS REPAIR Left    PERIPHERAL VASCULAR INTERVENTION  11/19/2022   Procedure: PERIPHERAL VASCULAR INTERVENTION;  Surgeon: Serene Gaile ORN, MD;  [facility]: [facility];  Service: Cardiovascular;;  Embolization coil   POLYPECTOMY  12/05/2022   Procedure: POLYPECTOMY;  Surgeon: Shila Gustav GAILS, MD;  Location: [facility];  Service: Gastroenterology;;   Patient Active Problem List   Diagnosis Date Noted   Rupture of right patellar tendon 03/03/2023   Other disorders of phosphorus metabolism 01/04/2023   Heme positive stool 12/05/2022   Generalized abdominal discomfort 12/05/2022   Polyp of ascending colon 12/05/2022   Gastritis and gastroduodenitis 12/05/2022   Gastric polyp 12/05/2022   Foot callus 12/03/2022   Iron deficiency anemia, unspecified 11/01/2022   Angiodysplasia of stomach and duodenum with bleeding 09/06/2022   Other iron deficiency anemias 08/30/2022   Allergy, unspecified, initial encounter 08/20/2022   Anaphylactic reaction due to adverse effect of correct drug or medicament properly administered, initial encounter 08/20/2022   Anaphylactic shock, unspecified, initial encounter 08/20/2022   Anemia in chronic kidney disease 08/20/2022   Coagulation defect, unspecified (HCC) 08/20/2022   Diarrhea, unspecified 08/20/2022   Encounter for immunization 08/20/2022   Encounter for screening for respiratory tuberculosis 08/20/2022   End stage renal disease (HCC) 08/20/2022   Fever, unspecified  08/20/2022   Nausea 08/20/2022   Pain, unspecified 08/20/2022   Pruritus, unspecified 08/20/2022   Dependence on renal dialysis (HCC) 08/16/2022   Hyperlipidemia, unspecified 08/16/2022   Long term (current) use of insulin  (HCC) 08/16/2022   Secondary hyperparathyroidism of renal origin (HCC) 08/16/2022   Vitamin D deficiency, unspecified 08/16/2022   Hematemesis  08/05/2022   Erectile dysfunction 01/18/2021   Pneumonia 11/15/2020   Hemoptysis 11/15/2020   CKD (chronic kidney disease), stage IV (HCC) 11/15/2020   Positive D dimer 11/15/2020   Knee injury, right, subsequent encounter 09/07/2020   Type 2 diabetes mellitus (HCC) 08/22/2020   Acute respiratory failure with hypoxia (HCC) 06/22/2020   Multifocal pneumonia 06/22/2020   Accelerated hypertension 06/20/2019   Elevated troponin 06/19/2019   Chronic diastolic CHF (congestive heart failure) (HCC) 06/19/2019   Hypoalbuminemia 06/19/2019   Hypertensive urgency 06/19/2019   AKI (acute kidney injury) (HCC) 06/19/2019   CHF (congestive heart failure) (HCC) 06/19/2019    PCP: de [place], [name], MD  REFERRING PROVIDER: Genelle Standing, MD   REFERRING DIAG: [phone] (ICD-10-CM) - Rupture of right patellar tendon, initial encounter   THERAPY DIAG:  Muscle weakness (generalized)  Stiffness of right knee, not elsewhere classified  Rationale for Evaluation and Treatment: Rehabilitation  ONSET DATE: 02/19/23, 03/03/23-right knee quadriceps tendon lengthening and patella tendon reconstruction with Achilles allograft   SUBJECTIVE:   SUBJECTIVE STATEMENT: Patient arrives with no crutches with immobilizer on RT knee. Forgot crutches this morning. Reports doing HEP.  PERTINENT HISTORY: Per referring physician note: PMHx: anemia, Blind L eye, CHF, CKD, stage 4, DM- on Dialysis, HLD, HTN, L patellar tendon repair about 20 years ago. Right knee with a proximately retracted patella. He is not able to actively extend against gravity. Range of motion of the right knee is from approximately 20 degrees to 40 degrees. There is tenderness about the remaining trochlear groove.  53 y.o. 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  MRI does not show significant osteoarthritis and given that I do believe he would benefit from a VY lengthening  of the quadriceps with the patella tendon reconstruction with allograft Achilles.   PAIN:  Are you having pain? Yes: NPRS scale: 5 Pain location: R knee Pain description: ache Aggravating factors: Worst at night Relieving factors: Ice  PRECAUTIONS: Knee,   RED FLAGS: None   WEIGHT BEARING RESTRICTIONS: Yes WBAT with brace and crutches  FALLS:  Has patient fallen in last 6 months? No  LIVING ENVIRONMENT: Lives with: lives with their family Lives in: House/apartment Stairs: No Has following equipment at home: None  OCCUPATION: On disability  PLOF: Independent  PATIENT GOALS: Patient would like to recover his ability to walk around normally.  NEXT MD VISIT: about 2 weeks  OBJECTIVE:  Note: Objective measures were completed at Evaluation unless otherwise noted.  DIAGNOSTIC FINDINGS:  03/03/23 Xray (4 views right knee): Evidence of an old patella tendon avulsion with significant retraction and osteoarthritis of the patella   MRI right knee: There is full-thickness chronic patella tendon rupture without significant patellofemoral cartilage loss  COGNITION: Overall cognitive status: Within functional limits for tasks assessed     SENSATION: WFL  EDEMA:  None observed  MUSCLE LENGTH: Hamstrings: WNL  POSTURE: Stands with weight shifted to LLE  PALPATION: Patient's dressings prevent full observation of knee, gentle patellar mobs performed, no pain  LOWER EXTREMITY ROM: LLE WNL  Passive ROM Right eval Left eval  Hip flexion  Hip extension    Hip abduction    Hip adduction    Hip internal rotation    Hip external rotation    Knee flexion 45   Knee extension 0   Ankle dorsiflexion 0   Ankle plantarflexion 52   Ankle inversion    Ankle eversion     (Blank rows = not tested)  LOWER EXTREMITY MMT: LLE 5/5, R hip at least 4/5, knee- active quad set- weak, ankle WNL  LOWER EXTREMITY SPECIAL TESTS:  N/T  FUNCTIONAL TESTS:  N/T  GAIT: Distance  walked: In clinic distances Assistive device utilized: None Level of assistance: Modified independence Comments: Patient reports he did not receive crutches after surgery. Later reports that his girlfriend has crutches he can adjust. He had this same surgery on LLE about 20 years ago, reports he can safely use crutches, declined to practice in the clinic.                                                                                                                              TREATMENT DATE:   03/11/23 Quad set hold 3 sec 10x Quad set with min a SLR 2 sets 10 HS slide  to 30 degrees 10 x 2 sets HS press down 10x hold 3 sec Patellar mobs with good mobility and non tender Assisted supine hip ABD and ADD 2 sets 10 Ankle DF,PF,INV,Ev red tband 2 sets 10   03/06/23 Education Quad sets, ankle pumps, HS to 45 degrees, SLR with max A Gentle patellar mobs  PATIENT EDUCATION:  Education details: POC, HEP Person educated: Patient Education method: Explanation Education comprehension: verbalized understanding and returned demonstration  HOME EXERCISE PROGRAM: [email or web address]  ASSESSMENT:  CLINICAL IMPRESSION:  Pt was able to demo HEP. We progressed ex with cuing and assistance and education for correct muscle activation without compensation. AA required for some ex. Educ to continue at home with current HEP and ice as needed with elevation for pain and swelling. Incision looks good, intact , no redness or drainage. Rewrapped guaze and ace bandage for pt as it had slide down. Good mobility in patella with patellar mobs. STG met. Pt had to be at hospital for procedure at 9am this morning. Patient is a 53 y.o. who was seen today for physical therapy evaluation and treatment for recover from R patellar tendon lengthening and repair. He is 3 days post op today. Arrived with no crutches, reports he will obtain some today, has used them in the past and is comfortable using them, declined education.  Following protocol, initiated HEP, practiced and gave him written instructions. Plan to see him x 1 next week and then increase to 2x/week beginning the next week when his rehab expands, per protocol. He will benefit from PT as he progresses through his reha, for supervised progression of strengthening and ROM in order to maximize his recovery and promote return to his PLOF or beyond.   OBJECTIVE IMPAIRMENTS:  Abnormal gait, decreased activity tolerance, decreased balance, decreased coordination, difficulty walking, decreased ROM, decreased strength, impaired flexibility, improper body mechanics, postural dysfunction, and pain.   ACTIVITY LIMITATIONS: carrying, lifting, bending, sitting, standing, squatting, sleeping, stairs, transfers, bathing, toileting, dressing, and locomotion level  PARTICIPATION LIMITATIONS: meal prep, cleaning, laundry, driving, shopping, and community activity  PERSONAL FACTORS: Past/current experiences are also affecting patient's functional outcome.   REHAB POTENTIAL: Good  CLINICAL DECISION MAKING: Evolving/moderate complexity  EVALUATION COMPLEXITY: Low   GOALS: Goals reviewed with patient? Yes  SHORT TERM GOALS: Target date: 03/21/23 I with initial HEP Baseline: Goal status: 03/12/23 MET  LONG TERM GOALS: Target date: 05/29/23  I with final HEP Baseline:  Goal status: INITIAL  2.  Patient will recover R knee AROM to at least 0-120 degrees Baseline: 0-45 Goal status: INITIAL  3.  Patient will recover R knee strength to 5/5 Baseline: Active, but weak quad contraction achieved. Goal status: INITIAL  4.  Patient will perform dynamic SLS activities on RLE on unstable surface with no LOB Baseline:  Goal status: INITIAL  5.  Patient will be able to walk x at least 1000' on all surfaces, including up and down steps, I, no gait abnormalities Baseline:  Goal status: INITIAL  6.  Patient will be able to perform dynamic, power moves such as jumping,  hopping, agility ladder drills with no pain or instability in R knee. Baseline:  Goal status: INITIAL   PLAN:  PT FREQUENCY: 1-2x/week  PT DURATION: 12 weeks  PLANNED INTERVENTIONS: 97110-Therapeutic exercises, 97530- Therapeutic activity, [id number]- Neuromuscular re-education, 97535- Self Care, [id number]- Manual therapy, [id number]- Gait training, 97014- Electrical stimulation (unattended), 97016- Vasopneumatic device, Patient/Family education, Balance training, Stair training, Taping, Dry Needling, Joint mobilization, Cryotherapy, and Moist heat  PLAN FOR NEXT SESSION: Proceed per protocol.   Patient Details  Name: Timothy [name] MRN: [id number] Date of Birth: 05-Oct-1970 Referring Provider:  Genelle Standing, MD  Encounter Date: 03/11/2023   MARSH SNIFF, PTA 03/11/2023, 7:49 AM  [facility] [address] [place], [place], [address] Phone: [phone]   Fax:  [phone]

## 2023-03-12 ENCOUNTER — Encounter (HOSPITAL_COMMUNITY): Payer: Self-pay | Admitting: Vascular Surgery

## 2023-03-12 ENCOUNTER — Ambulatory Visit (INDEPENDENT_AMBULATORY_CARE_PROVIDER_SITE_OTHER): Payer: 59 | Admitting: Orthopaedic Surgery

## 2023-03-12 DIAGNOSIS — D631 Anemia in chronic kidney disease: Secondary | ICD-10-CM | POA: Diagnosis not present

## 2023-03-12 DIAGNOSIS — S86811A Strain of other muscle(s) and tendon(s) at lower leg level, right leg, initial encounter: Secondary | ICD-10-CM

## 2023-03-12 DIAGNOSIS — D689 Coagulation defect, unspecified: Secondary | ICD-10-CM | POA: Diagnosis not present

## 2023-03-12 DIAGNOSIS — N2581 Secondary hyperparathyroidism of renal origin: Secondary | ICD-10-CM | POA: Diagnosis not present

## 2023-03-12 DIAGNOSIS — N186 End stage renal disease: Secondary | ICD-10-CM | POA: Diagnosis not present

## 2023-03-12 DIAGNOSIS — Z992 Dependence on renal dialysis: Secondary | ICD-10-CM | POA: Diagnosis not present

## 2023-03-12 DIAGNOSIS — E1122 Type 2 diabetes mellitus with diabetic chronic kidney disease: Secondary | ICD-10-CM | POA: Diagnosis not present

## 2023-03-12 NOTE — Progress Notes (Signed)
 Post Operative Evaluation    Procedure/Date of Surgery: Right patella tendon reconstruction 1/27  Interval History:    2 weeks status post right patella tendon reconstruction overall doing extremely well.  He has been weightbearing as tolerated and has knee immobilizer.  Denies any pain.  Overall he is very happy.   PMH/PSH/Family History/Social History/Meds/Allergies:    Past Medical History:  Diagnosis Date   Allergies    Anemia    Blind left eye    CHF (congestive heart failure) (HCC)    Chronic kidney disease    ESRD - HD M,W,F   Diabetes mellitus without complication (HCC)    Type2   Dyspnea    Patient denies SOB since he started dialysis. States he went away after he started dialysis   HLD (hyperlipidemia)    Hypertension    Pneumonia    x2   Past Surgical History:  Procedure Laterality Date   A/V FISTULAGRAM Left 11/19/2022   Procedure: A/V Fistulagram;  Surgeon: Serene Gaile ORN, MD;  Location: [facility];  Service: Cardiovascular;  Laterality: Left;   AV FISTULA PLACEMENT Left 09/19/2022   Procedure: ARTERIOVENOUS (AV) FISTULA CREATION;  Surgeon: Serene Gaile ORN, MD;  Location: [facility] OR;  Service: Vascular;  Laterality: Left;   BIOPSY  12/05/2022   Procedure: BIOPSY;  Surgeon: Shila Gustav GAILS, MD;  Location: [facility];  Service: Gastroenterology;;   CATARACT EXTRACTION Right    COLONOSCOPY WITH PROPOFOL  N/A 12/05/2022   Procedure: COLONOSCOPY WITH PROPOFOL ;  Surgeon: Shila Gustav GAILS, MD;  Location: [facility];  Service: Gastroenterology;  Laterality: N/A;   ESOPHAGOGASTRODUODENOSCOPY (EGD) WITH PROPOFOL  N/A 12/05/2022   Procedure: ESOPHAGOGASTRODUODENOSCOPY (EGD) WITH PROPOFOL ;  Surgeon: Shila Gustav GAILS, MD;  Location: [facility];  Service: Gastroenterology;  Laterality: N/A;   FRACTURE SURGERY Left    [place]   KNEE RECONSTRUCTION Right 03/03/2023   Procedure: RIGHT KNEE QUADRICEPS TENDON LENGTHENING  AND PATELLA TENDON RECONSTRUCTION WITH ACHILLES ALLOGRAFT;  Surgeon: Genelle Standing, MD;  Location: [facility];  Service: Orthopedics;  Laterality: Right;   LIGATION OF COMPETING BRANCHES OF ARTERIOVENOUS FISTULA Left 03/11/2023   Procedure: LIGATION OF COMPETING BRANCHES OF LEFT ARTERIOVENOUS FISTULA;  Surgeon: Pearline Norman RAMAN, MD;  Location: [facility] OR;  Service: Vascular;  Laterality: Left;   MENISCUS REPAIR Left    PERIPHERAL VASCULAR INTERVENTION  11/19/2022   Procedure: PERIPHERAL VASCULAR INTERVENTION;  Surgeon: Serene Gaile ORN, MD;  [facility];  Service: Cardiovascular;;  Embolization coil   POLYPECTOMY  12/05/2022   Procedure: POLYPECTOMY;  Surgeon: Shila Gustav GAILS, MD;  Location: [facility];  Service: Gastroenterology;;   Social History   Socioeconomic History   Marital status: Single    Spouse name: Not on file   Number of children: 1   Years of education: Not on file   Highest education level: Not on file  Occupational History   Occupation: disabled  Tobacco Use   Smoking status: Never    Passive exposure: Never   Smokeless tobacco: Never  Vaping Use   Vaping status: Never Used  Substance and Sexual Activity   Alcohol  use: Not Currently   Drug use: Not Currently   Sexual activity: Yes  Other Topics Concern   Not on file  Social History Narrative   Not on file   Social  Drivers of Health   Financial Resource Strain: Low Risk  (12/19/2022)   Overall Financial Resource Strain (CARDIA)    Difficulty of Paying Living Expenses: Not hard at all  Food Insecurity: No Food Insecurity (12/19/2022)   Hunger Vital Sign    Worried About Running Out of Food in the Last Year: Never true    Ran Out of Food in the Last Year: Never true  Transportation Needs: No Transportation Needs (12/19/2022)   PRAPARE - Administrator, Civil Service (Medical): No    Lack of Transportation (Non-Medical): No  Physical Activity: Insufficiently Active (12/19/2022)    Exercise Vital Sign    Days of Exercise per Week: 2 days    Minutes of Exercise per Session: 60 min  Stress: No Stress Concern Present (12/19/2022)   [employer] of Occupational Health - Occupational Stress Questionnaire    Feeling of Stress : Not at all  Social Connections: Moderately Integrated (12/19/2022)   Social Connection and Isolation Panel [NHANES]    Frequency of Communication with Friends and Family: More than three times a week    Frequency of Social Gatherings with Friends and Family: Once a week    Attends Religious Services: More than 4 times per year    Active Member of [employer] or Organizations: No    Attends Engineer, Structural: Never    Marital Status: Living with partner   Family History  Problem Relation Age of Onset   Hypertension Mother    Diabetes Mother    Heart attack Mother    Diabetes Brother    No Known Allergies Current Outpatient Medications  Medication Sig Dispense Refill   amLODipine  (NORVASC ) 10 MG tablet Take 10 mg by mouth daily.     atorvastatin  (LIPITOR) 40 MG tablet Take 1 tablet (40 mg total) by mouth daily. 90 tablet 1   Continuous Glucose Sensor (DEXCOM G7 SENSOR) MISC 1 Device by Does not apply route continuous. 9 each 1   Continuous Glucose Sensor (FREESTYLE LIBRE 3 SENSOR) MISC 1 each by Does not apply route See admin instructions. Replace every 14 days 6 each 1   Empagliflozin -linaGLIPtin  25-5 MG TABS Take 1 tablet by mouth daily. 90 tablet 1   furosemide  (LASIX ) 80 MG tablet Take 80 mg by mouth every Tuesday, Thursday, Saturday, and Sunday.     hydrALAZINE  (APRESOLINE ) 100 MG tablet Take 50 mg by mouth 3 (three) times daily. MAY TAKE EXTRA 50MG  FOR SBP>160     insulin  glargine (LANTUS  SOLOSTAR) 100 UNIT/ML Solostar Pen INJECT 5 UINTS INTO THE SKIN DAILY (Patient taking differently: Inject 8 Units into the skin at bedtime.) 15 mL 1   isosorbide  mononitrate (IMDUR ) 60 MG 24 hr tablet Take 60 mg by mouth daily.     labetalol   (NORMODYNE ) 200 MG tablet Take 400 mg by mouth 2 (two) times daily.     oxyCODONE -acetaminophen  (PERCOCET/ROXICET) 5-325 MG tablet Take 1 tablet by mouth every 6 (six) hours as needed. 12 tablet 0   pantoprazole  (PROTONIX ) 40 MG tablet Take 1 tablet (40 mg total) by mouth 2 (two) times daily before a meal. 180 tablet 3   torsemide (DEMADEX) 20 MG tablet Take 20 mg by mouth 2 (two) times daily.     No current facility-administered medications for this visit.   No results found.  Review of Systems:   A ROS was performed including pertinent positives and negatives as documented in the HPI.   Musculoskeletal Exam:    There  were no vitals taken for this visit.  Right knee with active full extension with good quadriceps strength but decreased bulk.  Flexion deferred today.  Distal neurosensory exam is intact  Imaging:      I personally reviewed and interpreted the radiographs.   Assessment:   2 weeks status post right patella tendon reconstruction overall doing extremely well.  At this time he will continue to advance according to the patella tendon repair protocol.  I will plan to see him back in 4 weeks for reassessment  Plan :    -Return to clinic 4 weeks for reassessment      I personally saw and evaluated the patient, and participated in the management and treatment plan.  Elspeth Parker, MD Attending Physician, Orthopedic Surgery  This document was dictated using Dragon voice recognition software. A reasonable attempt at proof reading has been made to minimize errors.

## 2023-03-14 ENCOUNTER — Encounter (HOSPITAL_BASED_OUTPATIENT_CLINIC_OR_DEPARTMENT_OTHER): Payer: 59 | Admitting: Orthopaedic Surgery

## 2023-03-14 DIAGNOSIS — N186 End stage renal disease: Secondary | ICD-10-CM | POA: Diagnosis not present

## 2023-03-14 DIAGNOSIS — D631 Anemia in chronic kidney disease: Secondary | ICD-10-CM | POA: Diagnosis not present

## 2023-03-14 DIAGNOSIS — Z992 Dependence on renal dialysis: Secondary | ICD-10-CM | POA: Diagnosis not present

## 2023-03-14 DIAGNOSIS — N2581 Secondary hyperparathyroidism of renal origin: Secondary | ICD-10-CM | POA: Diagnosis not present

## 2023-03-14 DIAGNOSIS — D689 Coagulation defect, unspecified: Secondary | ICD-10-CM | POA: Diagnosis not present

## 2023-03-14 DIAGNOSIS — E1122 Type 2 diabetes mellitus with diabetic chronic kidney disease: Secondary | ICD-10-CM | POA: Diagnosis not present

## 2023-03-17 DIAGNOSIS — N2581 Secondary hyperparathyroidism of renal origin: Secondary | ICD-10-CM | POA: Diagnosis not present

## 2023-03-17 DIAGNOSIS — Z992 Dependence on renal dialysis: Secondary | ICD-10-CM | POA: Diagnosis not present

## 2023-03-17 DIAGNOSIS — D689 Coagulation defect, unspecified: Secondary | ICD-10-CM | POA: Diagnosis not present

## 2023-03-17 DIAGNOSIS — D631 Anemia in chronic kidney disease: Secondary | ICD-10-CM | POA: Diagnosis not present

## 2023-03-17 DIAGNOSIS — E1122 Type 2 diabetes mellitus with diabetic chronic kidney disease: Secondary | ICD-10-CM | POA: Diagnosis not present

## 2023-03-17 DIAGNOSIS — N186 End stage renal disease: Secondary | ICD-10-CM | POA: Diagnosis not present

## 2023-03-18 ENCOUNTER — Ambulatory Visit: Payer: 59 | Admitting: Physical Therapy

## 2023-03-18 DIAGNOSIS — M25661 Stiffness of right knee, not elsewhere classified: Secondary | ICD-10-CM | POA: Diagnosis not present

## 2023-03-18 DIAGNOSIS — R600 Localized edema: Secondary | ICD-10-CM | POA: Diagnosis not present

## 2023-03-18 DIAGNOSIS — M6281 Muscle weakness (generalized): Secondary | ICD-10-CM

## 2023-03-18 DIAGNOSIS — R2681 Unsteadiness on feet: Secondary | ICD-10-CM | POA: Diagnosis not present

## 2023-03-18 DIAGNOSIS — R2689 Other abnormalities of gait and mobility: Secondary | ICD-10-CM | POA: Diagnosis not present

## 2023-03-18 NOTE — Therapy (Signed)
OUTPATIENT PHYSICAL THERAPY LOWER EXTREMITY    Patient Name: Timothy [name] MRN: [id number] DOB:1970/06/07, 53 y.o., male Today's Date: 03/18/2023  END OF SESSION:  PT End of Session - 03/18/23 0746     Visit Number 3    Date for PT Re-Evaluation 05/29/23    PT Start Time 0750    PT Stop Time 0835    PT Time Calculation (min) 45 min             Past Medical History:  Diagnosis Date   Allergies    Anemia    Blind left eye    CHF (congestive heart failure) (HCC)    Chronic kidney disease    ESRD - HD M,W,F   Diabetes mellitus without complication (HCC)    Type2   Dyspnea    Patient denies SOB since he started dialysis. States he went away after he started dialysis   HLD (hyperlipidemia)    Hypertension    Pneumonia    x2   Past Surgical History:  Procedure Laterality Date   A/V FISTULAGRAM Left 11/19/2022   Procedure: A/V Fistulagram;  Surgeon: Nada Libman, MD;  Location: [facility];  Service: Cardiovascular;  Laterality: Left;   AV FISTULA PLACEMENT Left 09/19/2022   Procedure: ARTERIOVENOUS (AV) FISTULA CREATION;  Surgeon: Nada Libman, MD;  Location: [facility] OR;  Service: Vascular;  Laterality: Left;   BIOPSY  12/05/2022   Procedure: BIOPSY;  Surgeon: Napoleon Form, MD;  Location: [facility];  Service: Gastroenterology;;   CATARACT EXTRACTION Right    COLONOSCOPY WITH PROPOFOL N/A 12/05/2022   Procedure: COLONOSCOPY WITH PROPOFOL;  Surgeon: Napoleon Form, MD;  Location: [facility];  Service: Gastroenterology;  Laterality: N/A;   ESOPHAGOGASTRODUODENOSCOPY (EGD) WITH PROPOFOL N/A 12/05/2022   Procedure: ESOPHAGOGASTRODUODENOSCOPY (EGD) WITH PROPOFOL;  Surgeon: Napoleon Form, MD;  Location: [facility];  Service: Gastroenterology;  Laterality: N/A;   FRACTURE SURGERY Left    [place]   KNEE RECONSTRUCTION Right 03/03/2023   Procedure: RIGHT KNEE QUADRICEPS TENDON LENGTHENING AND PATELLA TENDON RECONSTRUCTION WITH ACHILLES  ALLOGRAFT;  Surgeon: Huel Cote, MD;  Location: [facility] OR;  Service: Orthopedics;  Laterality: Right;   LIGATION OF COMPETING BRANCHES OF ARTERIOVENOUS FISTULA Left 03/11/2023   Procedure: LIGATION OF COMPETING BRANCHES OF LEFT ARTERIOVENOUS FISTULA;  Surgeon: Daria Pastures, MD;  Location: [facility] OR;  Service: Vascular;  Laterality: Left;   MENISCUS REPAIR Left    PERIPHERAL VASCULAR INTERVENTION  11/19/2022   Procedure: PERIPHERAL VASCULAR INTERVENTION;  Surgeon: Nada Libman, MD;  Location: [facility];  Service: Cardiovascular;;  Embolization coil   POLYPECTOMY  12/05/2022   Procedure: POLYPECTOMY;  Surgeon: Napoleon Form, MD;  Location: [facility];  Service: Gastroenterology;;   Patient Active Problem List   Diagnosis Date Noted   Rupture of right patellar tendon 03/03/2023   Other disorders of phosphorus metabolism 01/04/2023   Heme positive stool 12/05/2022   Generalized abdominal discomfort 12/05/2022   Polyp of ascending colon 12/05/2022   Gastritis and gastroduodenitis 12/05/2022   Gastric polyp 12/05/2022   Foot callus 12/03/2022   Iron deficiency anemia, unspecified 11/01/2022   Angiodysplasia of stomach and duodenum with bleeding 09/06/2022   Other iron deficiency anemias 08/30/2022   Allergy, unspecified, initial encounter 08/20/2022   Anaphylactic reaction due to adverse effect of correct drug or medicament properly administered, initial encounter 08/20/2022   Anaphylactic shock, unspecified, initial encounter 08/20/2022   Anemia in chronic kidney disease 08/20/2022  Coagulation defect, unspecified (HCC) 08/20/2022   Diarrhea, unspecified 08/20/2022   Encounter for immunization 08/20/2022   Encounter for screening for respiratory tuberculosis 08/20/2022   End stage renal disease (HCC) 08/20/2022   Fever, unspecified 08/20/2022   Nausea 08/20/2022   Pain, unspecified 08/20/2022   Pruritus, unspecified 08/20/2022   Dependence on renal dialysis (HCC)  08/16/2022   Hyperlipidemia, unspecified 08/16/2022   Long term (current) use of insulin (HCC) 08/16/2022   Secondary hyperparathyroidism of renal origin (HCC) 08/16/2022   Vitamin D deficiency, unspecified 08/16/2022   Hematemesis 08/05/2022   Erectile dysfunction 01/18/2021   Pneumonia 11/15/2020   Hemoptysis 11/15/2020   CKD (chronic kidney disease), stage IV (HCC) 11/15/2020   Positive D dimer 11/15/2020   Knee injury, right, subsequent encounter 09/07/2020   Type 2 diabetes mellitus (HCC) 08/22/2020   Acute respiratory failure with hypoxia (HCC) 06/22/2020   Multifocal pneumonia 06/22/2020   Accelerated hypertension 06/20/2019   Elevated troponin 06/19/2019   Chronic diastolic CHF (congestive heart failure) (HCC) 06/19/2019   Hypoalbuminemia 06/19/2019   Hypertensive urgency 06/19/2019   AKI (acute kidney injury) (HCC) 06/19/2019   CHF (congestive heart failure) (HCC) 06/19/2019    PCP: [name], [name], MD  REFERRING PROVIDER: Huel Cote, MD   REFERRING DIAG: [phone] (ICD-10-CM) - Rupture of right patellar tendon, initial encounter   THERAPY DIAG:  Muscle weakness (generalized)  Stiffness of right knee, not elsewhere classified  Localized edema  Rationale for Evaluation and Treatment: Rehabilitation  ONSET DATE: 02/19/23, 03/03/23-right knee quadriceps tendon lengthening and patella tendon reconstruction with Achilles allograft   SUBJECTIVE:   SUBJECTIVE STATEMENT: saw MD and he was pleased, got this new hinged brace. Amb in without AD. Doing HEP and MD very surprised I could do SLR PERTINENT HISTORY: Per referring physician note: PMHx: anemia, Blind L eye, CHF, CKD, stage 4, DM- on Dialysis, HLD, HTN, L patellar tendon repair about 20 years ago. Right knee with a proximately retracted patella. He is not able to actively extend against gravity. Range of motion of the right knee is from approximately 20 degrees to 40 degrees. There is tenderness about the  remaining trochlear groove.  53 y.o. 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  MRI does not show significant osteoarthritis and given that I do believe he would benefit from a VY lengthening of the quadriceps with the patella tendon reconstruction with allograft Achilles.   PAIN:  Are you having pain? Yes: NPRS scale: 5 Pain location: R knee Pain description: ache Aggravating factors: Worst at night Relieving factors: Ice  PRECAUTIONS: Knee,   RED FLAGS: None   WEIGHT BEARING RESTRICTIONS: Yes WBAT with brace and crutches  FALLS:  Has patient fallen in last 6 months? No  LIVING ENVIRONMENT: Lives with: lives with their family Lives in: House/apartment Stairs: No Has following equipment at home: None  OCCUPATION: On disability  PLOF: Independent  PATIENT GOALS: Patient would like to recover his ability to walk around normally.  NEXT MD VISIT: about 2 weeks  OBJECTIVE:  Note: Objective measures were completed at Evaluation unless otherwise noted.  DIAGNOSTIC FINDINGS:  03/03/23 Xray (4 views right knee): Evidence of an old patella tendon avulsion with significant retraction and osteoarthritis of the patella   MRI right knee: There is full-thickness chronic patella tendon rupture without significant patellofemoral cartilage loss  COGNITION: Overall cognitive status: Within functional limits for tasks assessed     SENSATION: WFL  EDEMA:  None observed  MUSCLE LENGTH: Hamstrings: WNL  POSTURE: Stands with weight shifted to LLE  PALPATION: Patient's dressings prevent full observation of knee, gentle patellar mobs performed, no pain  LOWER EXTREMITY ROM: LLE WNL  Passive ROM Right eval Left eval  Hip flexion    Hip extension    Hip abduction    Hip adduction    Hip internal rotation    Hip external rotation    Knee flexion 45   Knee extension 0   Ankle dorsiflexion 0   Ankle  plantarflexion 52   Ankle inversion    Ankle eversion     (Blank rows = not tested)  LOWER EXTREMITY MMT: LLE 5/5, R hip at least 4/5, knee- active quad set- weak, ankle WNL  LOWER EXTREMITY SPECIAL TESTS:  N/T  FUNCTIONAL TESTS:  N/T  GAIT: Distance walked: In clinic distances Assistive device utilized: None Level of assistance: Modified independence Comments: Patient reports he did not receive crutches after surgery. Later reports that his girlfriend has crutches he can adjust. He had this same surgery on LLE about 20 years ago, reports he can safely use crutches, declined to practice in the clinic.                                                                                                                              TREATMENT DATE:   03/18/23 Black bar heel and toe raises 2 sets 10 with brace on Resisted gait 4 ways 5 x each with brace on 20 # SLR 4 way lying 2 sets 10 Feet on ball small bridge 2 sets 10 Feet on ball small HS curl < 60 degrees 2 sets 10, the isometric HS  STW to RT knee, quad and incision ( okayed to massage scar at home ,instructed and he VU) tightness ITB and lateral quad  03/11/23 Quad set hold 3 sec 10x Quad set with min a SLR 2 sets 10 HS slide  to 30 degrees 10 x 2 sets HS press down 10x hold 3 sec Patellar mobs with good mobility and non tender Assisted supine hip ABD and ADD 2 sets 10 Ankle DF,PF,INV,Ev red tband 2 sets 10   03/06/23 Education Quad sets, ankle pumps, HS to 45 degrees, SLR with max A Gentle patellar mobs  PATIENT EDUCATION:  Education details: POC, HEP Person educated: Patient Education method: Explanation Education comprehension: verbalized understanding and returned demonstration  HOME EXERCISE PROGRAM: [email or web address]  ASSESSMENT:  CLINICAL IMPRESSION: pt arrived with new hinged brace ,locked and no AD, stated MD very pleased and stated use AD as needed. Very pleased he could do SLR . Performed and instructed in STW and  CFM for incision at home.progressed ex per protocol with cuing.  Patient is a 53 y.o. who was seen today for physical therapy evaluation and treatment for recover from R patellar tendon lengthening and repair. He is 3 days post op today. Arrived with no crutches, reports he  will obtain some today, has used them in the past and is comfortable using them, declined education. Following protocol, initiated HEP, practiced and gave him written instructions. Plan to see him x 1 next week and then increase to 2x/week beginning the next week when his rehab expands, per protocol. He will benefit from PT as he progresses through his reha, for supervised progression of strengthening and ROM in order to maximize his recovery and promote return to his PLOF or beyond.   OBJECTIVE IMPAIRMENTS: Abnormal gait, decreased activity tolerance, decreased balance, decreased coordination, difficulty walking, decreased ROM, decreased strength, impaired flexibility, improper body mechanics, postural dysfunction, and pain.   ACTIVITY LIMITATIONS: carrying, lifting, bending, sitting, standing, squatting, sleeping, stairs, transfers, bathing, toileting, dressing, and locomotion level  PARTICIPATION LIMITATIONS: meal prep, cleaning, laundry, driving, shopping, and community activity  PERSONAL FACTORS: Past/current experiences are also affecting patient's functional outcome.   REHAB POTENTIAL: Good  CLINICAL DECISION MAKING: Evolving/moderate complexity  EVALUATION COMPLEXITY: Low   GOALS: Goals reviewed with patient? Yes  SHORT TERM GOALS: Target date: 03/21/23 I with initial HEP Baseline: Goal status: 03/12/23 MET  LONG TERM GOALS: Target date: 05/29/23  I with final HEP Baseline:  Goal status: INITIAL  2.  Patient will recover R knee AROM to at least 0-120 degrees Baseline: 0-45 Goal status: INITIAL  3.  Patient will recover R knee strength to 5/5 Baseline: Active, but weak quad contraction achieved. Goal  status: INITIAL  4.  Patient will perform dynamic SLS activities on RLE on unstable surface with no LOB Baseline:  Goal status: INITIAL  5.  Patient will be able to walk x at least 1000' on all surfaces, including up and down steps, I, no gait abnormalities Baseline:  Goal status: INITIAL  6.  Patient will be able to perform dynamic, power moves such as jumping, hopping, agility ladder drills with no pain or instability in R knee. Baseline:  Goal status: INITIAL   PLAN:  PT FREQUENCY: 1-2x/week  PT DURATION: 12 weeks  PLANNED INTERVENTIONS: 97110-Therapeutic exercises, 97530- Therapeutic activity, [id number]- Neuromuscular re-education, 97535- Self Care, 16109- Manual therapy, [id number]- Gait training, 97014- Electrical stimulation (unattended), 97016- Vasopneumatic device, Patient/Family education, Balance training, Stair training, Taping, Dry Needling, Joint mobilization, Cryotherapy, and Moist heat  PLAN FOR NEXT SESSION: Proceed per protocol.   Patient Details  Name: Marcellas Marchant MRN: [id number] Date of Birth: 1970/11/14 Referring Provider:  [name], [name], MD  Encounter Date: 03/18/2023   Suanne Marker, PTA 03/18/2023, 7:47 AM  [facility] [address] [place], [place], [address] Phone: [phone]   Fax:  [phone] [address] [place], [place], [address] Phone: [phone]   Fax:  [phone]

## 2023-03-19 DIAGNOSIS — N186 End stage renal disease: Secondary | ICD-10-CM | POA: Diagnosis not present

## 2023-03-19 DIAGNOSIS — N2581 Secondary hyperparathyroidism of renal origin: Secondary | ICD-10-CM | POA: Diagnosis not present

## 2023-03-19 DIAGNOSIS — E1122 Type 2 diabetes mellitus with diabetic chronic kidney disease: Secondary | ICD-10-CM | POA: Diagnosis not present

## 2023-03-19 DIAGNOSIS — D631 Anemia in chronic kidney disease: Secondary | ICD-10-CM | POA: Diagnosis not present

## 2023-03-19 DIAGNOSIS — D689 Coagulation defect, unspecified: Secondary | ICD-10-CM | POA: Diagnosis not present

## 2023-03-19 DIAGNOSIS — Z992 Dependence on renal dialysis: Secondary | ICD-10-CM | POA: Diagnosis not present

## 2023-03-20 ENCOUNTER — Ambulatory Visit: Payer: 59 | Admitting: Physical Therapy

## 2023-03-20 DIAGNOSIS — R2681 Unsteadiness on feet: Secondary | ICD-10-CM | POA: Diagnosis not present

## 2023-03-20 DIAGNOSIS — R600 Localized edema: Secondary | ICD-10-CM | POA: Diagnosis not present

## 2023-03-20 DIAGNOSIS — R2689 Other abnormalities of gait and mobility: Secondary | ICD-10-CM | POA: Diagnosis not present

## 2023-03-20 DIAGNOSIS — M25661 Stiffness of right knee, not elsewhere classified: Secondary | ICD-10-CM

## 2023-03-20 DIAGNOSIS — M6281 Muscle weakness (generalized): Secondary | ICD-10-CM | POA: Diagnosis not present

## 2023-03-20 NOTE — Therapy (Signed)
OUTPATIENT PHYSICAL THERAPY LOWER EXTREMITY    Patient Name: Timothy [name] MRN: [id number] DOB:November 12, 1970, 53 y.o., male Today's Date: 03/20/2023  END OF SESSION:  PT End of Session - 03/20/23 0751     Visit Number 4    Date for PT Re-Evaluation 05/29/23    PT Start Time 0752    PT Stop Time 0840    PT Time Calculation (min) 48 min             Past Medical History:  Diagnosis Date   Allergies    Anemia    Blind left eye    CHF (congestive heart failure) (HCC)    Chronic kidney disease    ESRD - HD M,W,F   Diabetes mellitus without complication (HCC)    Type2   Dyspnea    Patient denies SOB since he started dialysis. States he went away after he started dialysis   HLD (hyperlipidemia)    Hypertension    Pneumonia    x2   Past Surgical History:  Procedure Laterality Date   A/V FISTULAGRAM Left 11/19/2022   Procedure: A/V Fistulagram;  Surgeon: Nada Libman, MD;  Location: [facility];  Service: Cardiovascular;  Laterality: Left;   AV FISTULA PLACEMENT Left 09/19/2022   Procedure: ARTERIOVENOUS (AV) FISTULA CREATION;  Surgeon: Nada Libman, MD;  Location: [facility] OR;  Service: Vascular;  Laterality: Left;   BIOPSY  12/05/2022   Procedure: BIOPSY;  Surgeon: Napoleon Form, MD;  Location: [facility];  Service: Gastroenterology;;   CATARACT EXTRACTION Right    COLONOSCOPY WITH PROPOFOL N/A 12/05/2022   Procedure: COLONOSCOPY WITH PROPOFOL;  Surgeon: Napoleon Form, MD;  Location: [facility];  Service: Gastroenterology;  Laterality: N/A;   ESOPHAGOGASTRODUODENOSCOPY (EGD) WITH PROPOFOL N/A 12/05/2022   Procedure: ESOPHAGOGASTRODUODENOSCOPY (EGD) WITH PROPOFOL;  Surgeon: Napoleon Form, MD;  Location: [facility];  Service: Gastroenterology;  Laterality: N/A;   FRACTURE SURGERY Left    [place]   KNEE RECONSTRUCTION Right 03/03/2023   Procedure: RIGHT KNEE QUADRICEPS TENDON LENGTHENING AND PATELLA TENDON RECONSTRUCTION WITH ACHILLES  ALLOGRAFT;  Surgeon: Huel Cote, MD;  Location: [facility] OR;  Service: Orthopedics;  Laterality: Right;   LIGATION OF COMPETING BRANCHES OF ARTERIOVENOUS FISTULA Left 03/11/2023   Procedure: LIGATION OF COMPETING BRANCHES OF LEFT ARTERIOVENOUS FISTULA;  Surgeon: Daria Pastures, MD;  Location: [facility] OR;  Service: Vascular;  Laterality: Left;   MENISCUS REPAIR Left    PERIPHERAL VASCULAR INTERVENTION  11/19/2022   Procedure: PERIPHERAL VASCULAR INTERVENTION;  Surgeon: Nada Libman, MD;  Location: [facility];  Service: Cardiovascular;;  Embolization coil   POLYPECTOMY  12/05/2022   Procedure: POLYPECTOMY;  Surgeon: Napoleon Form, MD;  Location: [facility];  Service: Gastroenterology;;   Patient Active Problem List   Diagnosis Date Noted   Rupture of right patellar tendon 03/03/2023   Other disorders of phosphorus metabolism 01/04/2023   Heme positive stool 12/05/2022   Generalized abdominal discomfort 12/05/2022   Polyp of ascending colon 12/05/2022   Gastritis and gastroduodenitis 12/05/2022   Gastric polyp 12/05/2022   Foot callus 12/03/2022   Iron deficiency anemia, unspecified 11/01/2022   Angiodysplasia of stomach and duodenum with bleeding 09/06/2022   Other iron deficiency anemias 08/30/2022   Allergy, unspecified, initial encounter 08/20/2022   Anaphylactic reaction due to adverse effect of correct drug or medicament properly administered, initial encounter 08/20/2022   Anaphylactic shock, unspecified, initial encounter 08/20/2022   Anemia in chronic kidney disease 08/20/2022  Coagulation defect, unspecified (HCC) 08/20/2022   Diarrhea, unspecified 08/20/2022   Encounter for immunization 08/20/2022   Encounter for screening for respiratory tuberculosis 08/20/2022   End stage renal disease (HCC) 08/20/2022   Fever, unspecified 08/20/2022   Nausea 08/20/2022   Pain, unspecified 08/20/2022   Pruritus, unspecified 08/20/2022   Dependence on renal dialysis (HCC)  08/16/2022   Hyperlipidemia, unspecified 08/16/2022   Long term (current) use of insulin (HCC) 08/16/2022   Secondary hyperparathyroidism of renal origin (HCC) 08/16/2022   Vitamin D deficiency, unspecified 08/16/2022   Hematemesis 08/05/2022   Erectile dysfunction 01/18/2021   Pneumonia 11/15/2020   Hemoptysis 11/15/2020   CKD (chronic kidney disease), stage IV (HCC) 11/15/2020   Positive D dimer 11/15/2020   Knee injury, right, subsequent encounter 09/07/2020   Type 2 diabetes mellitus (HCC) 08/22/2020   Acute respiratory failure with hypoxia (HCC) 06/22/2020   Multifocal pneumonia 06/22/2020   Accelerated hypertension 06/20/2019   Elevated troponin 06/19/2019   Chronic diastolic CHF (congestive heart failure) (HCC) 06/19/2019   Hypoalbuminemia 06/19/2019   Hypertensive urgency 06/19/2019   AKI (acute kidney injury) (HCC) 06/19/2019   CHF (congestive heart failure) (HCC) 06/19/2019    PCP: [name], [name], MD  REFERRING PROVIDER: Huel Cote, MD   REFERRING DIAG: [phone] (ICD-10-CM) - Rupture of right patellar tendon, initial encounter   THERAPY DIAG:  Muscle weakness (generalized)  Stiffness of right knee, not elsewhere classified  Localized edema  Rationale for Evaluation and Treatment: Rehabilitation  ONSET DATE: 02/19/23, 03/03/23-right knee quadriceps tendon lengthening and patella tendon reconstruction with Achilles allograft   SUBJECTIVE:   SUBJECTIVE STATEMENT:  doing okay  PERTINENT HISTORY: Per referring physician note: PMHx: anemia, Blind L eye, CHF, CKD, stage 4, DM- on Dialysis, HLD, HTN, L patellar tendon repair about 20 years ago. Right knee with a proximately retracted patella. He is not able to actively extend against gravity. Range of motion of the right knee is from approximately 20 degrees to 40 degrees. There is tenderness about the remaining trochlear groove.  53 y.o. 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  MRI does not show significant osteoarthritis and given that I do believe he would benefit from a VY lengthening of the quadriceps with the patella tendon reconstruction with allograft Achilles.   PAIN:  Are you having pain? Yes: NPRS scale: 5 Pain location: R knee Pain description: ache Aggravating factors: Worst at night Relieving factors: Ice  PRECAUTIONS: Knee,   RED FLAGS: None   WEIGHT BEARING RESTRICTIONS: Yes WBAT with brace and crutches  FALLS:  Has patient fallen in last 6 months? No  LIVING ENVIRONMENT: Lives with: lives with their family Lives in: House/apartment Stairs: No Has following equipment at home: None  OCCUPATION: On disability  PLOF: Independent  PATIENT GOALS: Patient would like to recover his ability to walk around normally.  NEXT MD VISIT: about 2 weeks  OBJECTIVE:  Note: Objective measures were completed at Evaluation unless otherwise noted.  DIAGNOSTIC FINDINGS:  03/03/23 Xray (4 views right knee): Evidence of an old patella tendon avulsion with significant retraction and osteoarthritis of the patella   MRI right knee: There is full-thickness chronic patella tendon rupture without significant patellofemoral cartilage loss  COGNITION: Overall cognitive status: Within functional limits for tasks assessed     SENSATION: WFL  EDEMA:  None observed  MUSCLE LENGTH: Hamstrings: WNL  POSTURE: Stands with weight shifted to LLE  PALPATION: Patient's dressings prevent full  observation of knee, gentle patellar mobs performed, no pain  LOWER EXTREMITY ROM: LLE WNL  Passive ROM Right eval Left eval  Hip flexion    Hip extension    Hip abduction    Hip adduction    Hip internal rotation    Hip external rotation    Knee flexion 45   Knee extension 0   Ankle dorsiflexion 0   Ankle plantarflexion 52   Ankle inversion    Ankle eversion     (Blank rows = not tested)  LOWER EXTREMITY  MMT: LLE 5/5, R hip at least 4/5, knee- active quad set- weak, ankle WNL  LOWER EXTREMITY SPECIAL TESTS:  N/T  FUNCTIONAL TESTS:  N/T  GAIT: Distance walked: In clinic distances Assistive device utilized: None Level of assistance: Modified independence Comments: Patient reports he did not receive crutches after surgery. Later reports that his girlfriend has crutches he can adjust. He had this same surgery on LLE about 20 years ago, reports he can safely use crutches, declined to practice in the clinic.                                                                                                                              TREATMENT DATE:   03/20/23 Black bar heel and toe raises 2 sets 10 with brace on Resisted gait 4 ways 5 x each with brace on 30#- brace on Standing hip cable pulleys 4 way 5# 10 x BIL- brace on Feet on ball small bridge 2 sets 10 Feet on ball small HS curl < 60 degrees 2 sets 10, the isometric HS  Estim RUSSIAN 10 min quad set 5sec on/5 sec off STW to RT knee, quad and incision    03/18/23 Black bar heel and toe raises 2 sets 10 with brace on Resisted gait 4 ways 5 x each with brace on 20 # SLR 4 way lying 2 sets 10 Feet on ball small bridge 2 sets 10 Feet on ball small HS curl < 60 degrees 2 sets 10, the isometric HS  STW to RT knee, quad and incision ( okayed to massage scar at home ,instructed and he VU) tightness ITB and lateral quad  03/11/23 Quad set hold 3 sec 10x Quad set with min a SLR 2 sets 10 HS slide  to 30 degrees 10 x 2 sets HS press down 10x hold 3 sec Patellar mobs with good mobility and non tender Assisted supine hip ABD and ADD 2 sets 10 Ankle DF,PF,INV,Ev red tband 2 sets 10   03/06/23 Education Quad sets, ankle pumps, HS to 45 degrees, SLR with max A Gentle patellar mobs  PATIENT EDUCATION:  Education details: POC, HEP Person educated: Patient Education method: Explanation Education comprehension: verbalized understanding and  returned demonstration  HOME EXERCISE PROGRAM: [id number]  ASSESSMENT:  CLINICAL IMPRESSION: advancing ex per protocol with cuing needed. Added in estim today with quad set for strength.  Patient is a  53 y.o. who was seen today for physical therapy evaluation and treatment for recover from R patellar tendon lengthening and repair. He is 3 days post op today. Arrived with no crutches, reports he will obtain some today, has used them in the past and is comfortable using them, declined education. Following protocol, initiated HEP, practiced and gave him written instructions. Plan to see him x 1 next week and then increase to 2x/week beginning the next week when his rehab expands, per protocol. He will benefit from PT as he progresses through his reha, for supervised progression of strengthening and ROM in order to maximize his recovery and promote return to his PLOF or beyond.   OBJECTIVE IMPAIRMENTS: Abnormal gait, decreased activity tolerance, decreased balance, decreased coordination, difficulty walking, decreased ROM, decreased strength, impaired flexibility, improper body mechanics, postural dysfunction, and pain.   ACTIVITY LIMITATIONS: carrying, lifting, bending, sitting, standing, squatting, sleeping, stairs, transfers, bathing, toileting, dressing, and locomotion level  PARTICIPATION LIMITATIONS: meal prep, cleaning, laundry, driving, shopping, and community activity  PERSONAL FACTORS: Past/current experiences are also affecting patient's functional outcome.   REHAB POTENTIAL: Good  CLINICAL DECISION MAKING: Evolving/moderate complexity  EVALUATION COMPLEXITY: Low   GOALS: Goals reviewed with patient? Yes  SHORT TERM GOALS: Target date: 03/21/23 I with initial HEP Baseline: Goal status: 03/12/23 MET  LONG TERM GOALS: Target date: 05/29/23  I with final HEP Baseline:  Goal status: INITIAL  2.  Patient will recover R knee AROM to at least 0-120 degrees Baseline: 0-45 Goal  status: INITIAL  3.  Patient will recover R knee strength to 5/5 Baseline: Active, but weak quad contraction achieved. Goal status: INITIAL  4.  Patient will perform dynamic SLS activities on RLE on unstable surface with no LOB Baseline:  Goal status: INITIAL  5.  Patient will be able to walk x at least 1000' on all surfaces, including up and down steps, I, no gait abnormalities Baseline:  Goal status: INITIAL  6.  Patient will be able to perform dynamic, power moves such as jumping, hopping, agility ladder drills with no pain or instability in R knee. Baseline:  Goal status: INITIAL   PLAN:  PT FREQUENCY: 1-2x/week  PT DURATION: 12 weeks  PLANNED INTERVENTIONS: 97110-Therapeutic exercises, 97530- Therapeutic activity, [id number]- Neuromuscular re-education, 97535- Self Care, 40981- Manual therapy, [id number]- Gait training, 97014- Electrical stimulation (unattended), 97016- Vasopneumatic device, Patient/Family education, Balance training, Stair training, Taping, Dry Needling, Joint mobilization, Cryotherapy, and Moist heat  PLAN FOR NEXT SESSION: Proceed per protocol.   Patient Details  Name: Marzell Isakson MRN: [id number] Date of Birth: 1970-11-10 Referring Provider:  [name], [name], MD  Encounter Date: 03/20/2023   Suanne Marker, PTA 03/20/2023, 7:52 AM  [facility] [address] [place], [place], [address] Phone: [phone]   Fax:  [phone] [address] [place], [place], [address] Phone: [phone]   Fax:  [phone] [facility] at [facility] [address] [place], [place], [address] Phone: [phone]   Fax:  [phone]  Patient Details  Name: Ashir Kunz MRN: [id number] Date of Birth: 12/18/1970 Referring Provider:  [name], [name], MD  Encounter Date:  03/20/2023   Suanne Marker, PTA 03/20/2023, 7:52 AM  [facility] [address] [place], [place], [address] Phone: [phone]   Fax:  [phone]

## 2023-03-21 DIAGNOSIS — D631 Anemia in chronic kidney disease: Secondary | ICD-10-CM | POA: Diagnosis not present

## 2023-03-21 DIAGNOSIS — D689 Coagulation defect, unspecified: Secondary | ICD-10-CM | POA: Diagnosis not present

## 2023-03-21 DIAGNOSIS — E1122 Type 2 diabetes mellitus with diabetic chronic kidney disease: Secondary | ICD-10-CM | POA: Diagnosis not present

## 2023-03-21 DIAGNOSIS — N2581 Secondary hyperparathyroidism of renal origin: Secondary | ICD-10-CM | POA: Diagnosis not present

## 2023-03-21 DIAGNOSIS — Z992 Dependence on renal dialysis: Secondary | ICD-10-CM | POA: Diagnosis not present

## 2023-03-21 DIAGNOSIS — N186 End stage renal disease: Secondary | ICD-10-CM | POA: Diagnosis not present

## 2023-03-24 DIAGNOSIS — N2581 Secondary hyperparathyroidism of renal origin: Secondary | ICD-10-CM | POA: Diagnosis not present

## 2023-03-24 DIAGNOSIS — D631 Anemia in chronic kidney disease: Secondary | ICD-10-CM | POA: Diagnosis not present

## 2023-03-24 DIAGNOSIS — D689 Coagulation defect, unspecified: Secondary | ICD-10-CM | POA: Diagnosis not present

## 2023-03-24 DIAGNOSIS — N186 End stage renal disease: Secondary | ICD-10-CM | POA: Diagnosis not present

## 2023-03-24 DIAGNOSIS — Z992 Dependence on renal dialysis: Secondary | ICD-10-CM | POA: Diagnosis not present

## 2023-03-24 DIAGNOSIS — E1122 Type 2 diabetes mellitus with diabetic chronic kidney disease: Secondary | ICD-10-CM | POA: Diagnosis not present

## 2023-03-25 ENCOUNTER — Other Ambulatory Visit: Payer: 59

## 2023-03-25 ENCOUNTER — Ambulatory Visit: Payer: 59 | Admitting: Physical Therapy

## 2023-03-26 DIAGNOSIS — N186 End stage renal disease: Secondary | ICD-10-CM | POA: Diagnosis not present

## 2023-03-26 DIAGNOSIS — D689 Coagulation defect, unspecified: Secondary | ICD-10-CM | POA: Diagnosis not present

## 2023-03-26 DIAGNOSIS — D631 Anemia in chronic kidney disease: Secondary | ICD-10-CM | POA: Diagnosis not present

## 2023-03-26 DIAGNOSIS — N2581 Secondary hyperparathyroidism of renal origin: Secondary | ICD-10-CM | POA: Diagnosis not present

## 2023-03-26 DIAGNOSIS — E1122 Type 2 diabetes mellitus with diabetic chronic kidney disease: Secondary | ICD-10-CM | POA: Diagnosis not present

## 2023-03-26 DIAGNOSIS — Z992 Dependence on renal dialysis: Secondary | ICD-10-CM | POA: Diagnosis not present

## 2023-03-27 ENCOUNTER — Ambulatory Visit: Payer: 59 | Admitting: Physical Therapy

## 2023-03-27 ENCOUNTER — Encounter: Payer: Self-pay | Admitting: Physical Therapy

## 2023-03-27 DIAGNOSIS — R2689 Other abnormalities of gait and mobility: Secondary | ICD-10-CM | POA: Diagnosis not present

## 2023-03-27 DIAGNOSIS — R2681 Unsteadiness on feet: Secondary | ICD-10-CM | POA: Diagnosis not present

## 2023-03-27 DIAGNOSIS — R600 Localized edema: Secondary | ICD-10-CM

## 2023-03-27 DIAGNOSIS — M25661 Stiffness of right knee, not elsewhere classified: Secondary | ICD-10-CM | POA: Diagnosis not present

## 2023-03-27 DIAGNOSIS — M6281 Muscle weakness (generalized): Secondary | ICD-10-CM

## 2023-03-27 DIAGNOSIS — Z992 Dependence on renal dialysis: Secondary | ICD-10-CM | POA: Diagnosis not present

## 2023-03-27 DIAGNOSIS — N186 End stage renal disease: Secondary | ICD-10-CM | POA: Diagnosis not present

## 2023-03-27 NOTE — Therapy (Signed)
OUTPATIENT PHYSICAL THERAPY LOWER EXTREMITY    Patient Name: Timothy [name] MRN: [id number] DOB:03-Jul-1970, 53 y.o., male Today's Date: 03/27/2023  END OF SESSION:  PT End of Session - 03/27/23 0751     Visit Number 5    Date for PT Re-Evaluation 05/29/23    PT Start Time 0752    PT Stop Time 0837    PT Time Calculation (min) 45 min    Activity Tolerance Patient tolerated treatment well    Behavior During Therapy [facility] for tasks assessed/performed             Past Medical History:  Diagnosis Date   Allergies    Anemia    Blind left eye    CHF (congestive heart failure) (HCC)    Chronic kidney disease    ESRD - HD M,W,F   Diabetes mellitus without complication (HCC)    Type2   Dyspnea    Patient denies SOB since he started dialysis. States he went away after he started dialysis   HLD (hyperlipidemia)    Hypertension    Pneumonia    x2   Past Surgical History:  Procedure Laterality Date   A/V FISTULAGRAM Left 11/19/2022   Procedure: A/V Fistulagram;  Surgeon: Nada Libman, MD;  Location: [facility];  Service: Cardiovascular;  Laterality: Left;   AV FISTULA PLACEMENT Left 09/19/2022   Procedure: ARTERIOVENOUS (AV) FISTULA CREATION;  Surgeon: Nada Libman, MD;  Location: [facility] OR;  Service: Vascular;  Laterality: Left;   BIOPSY  12/05/2022   Procedure: BIOPSY;  Surgeon: Napoleon Form, MD;  Location: [facility];  Service: Gastroenterology;;   CATARACT EXTRACTION Right    COLONOSCOPY WITH PROPOFOL N/A 12/05/2022   Procedure: COLONOSCOPY WITH PROPOFOL;  Surgeon: Napoleon Form, MD;  Location: [facility];  Service: Gastroenterology;  Laterality: N/A;   ESOPHAGOGASTRODUODENOSCOPY (EGD) WITH PROPOFOL N/A 12/05/2022   Procedure: ESOPHAGOGASTRODUODENOSCOPY (EGD) WITH PROPOFOL;  Surgeon: Napoleon Form, MD;  Location: [facility];  Service: Gastroenterology;  Laterality: N/A;   FRACTURE SURGERY Left    [place]   KNEE RECONSTRUCTION Right  03/03/2023   Procedure: RIGHT KNEE QUADRICEPS TENDON LENGTHENING AND PATELLA TENDON RECONSTRUCTION WITH ACHILLES ALLOGRAFT;  Surgeon: Huel Cote, MD;  Location: [facility] OR;  Service: Orthopedics;  Laterality: Right;   LIGATION OF COMPETING BRANCHES OF ARTERIOVENOUS FISTULA Left 03/11/2023   Procedure: LIGATION OF COMPETING BRANCHES OF LEFT ARTERIOVENOUS FISTULA;  Surgeon: Daria Pastures, MD;  Location: [facility] OR;  Service: Vascular;  Laterality: Left;   MENISCUS REPAIR Left    PERIPHERAL VASCULAR INTERVENTION  11/19/2022   Procedure: PERIPHERAL VASCULAR INTERVENTION;  Surgeon: Nada Libman, MD;  Location: [facility];  Service: Cardiovascular;;  Embolization coil   POLYPECTOMY  12/05/2022   Procedure: POLYPECTOMY;  Surgeon: Napoleon Form, MD;  Location: [facility];  Service: Gastroenterology;;   Patient Active Problem List   Diagnosis Date Noted   Rupture of right patellar tendon 03/03/2023   Other disorders of phosphorus metabolism 01/04/2023   Heme positive stool 12/05/2022   Generalized abdominal discomfort 12/05/2022   Polyp of ascending colon 12/05/2022   Gastritis and gastroduodenitis 12/05/2022   Gastric polyp 12/05/2022   Foot callus 12/03/2022   Iron deficiency anemia, unspecified 11/01/2022   Angiodysplasia of stomach and duodenum with bleeding 09/06/2022   Other iron deficiency anemias 08/30/2022   Allergy, unspecified, initial encounter 08/20/2022   Anaphylactic reaction due to adverse effect of correct drug or medicament properly administered, initial  encounter 08/20/2022   Anaphylactic shock, unspecified, initial encounter 08/20/2022   Anemia in chronic kidney disease 08/20/2022   Coagulation defect, unspecified (HCC) 08/20/2022   Diarrhea, unspecified 08/20/2022   Encounter for immunization 08/20/2022   Encounter for screening for respiratory tuberculosis 08/20/2022   End stage renal disease (HCC) 08/20/2022   Fever, unspecified 08/20/2022   Nausea  08/20/2022   Pain, unspecified 08/20/2022   Pruritus, unspecified 08/20/2022   Dependence on renal dialysis (HCC) 08/16/2022   Hyperlipidemia, unspecified 08/16/2022   Long term (current) use of insulin (HCC) 08/16/2022   Secondary hyperparathyroidism of renal origin (HCC) 08/16/2022   Vitamin D deficiency, unspecified 08/16/2022   Hematemesis 08/05/2022   Erectile dysfunction 01/18/2021   Pneumonia 11/15/2020   Hemoptysis 11/15/2020   CKD (chronic kidney disease), stage IV (HCC) 11/15/2020   Positive D dimer 11/15/2020   Knee injury, right, subsequent encounter 09/07/2020   Type 2 diabetes mellitus (HCC) 08/22/2020   Acute respiratory failure with hypoxia (HCC) 06/22/2020   Multifocal pneumonia 06/22/2020   Accelerated hypertension 06/20/2019   Elevated troponin 06/19/2019   Chronic diastolic CHF (congestive heart failure) (HCC) 06/19/2019   Hypoalbuminemia 06/19/2019   Hypertensive urgency 06/19/2019   AKI (acute kidney injury) (HCC) 06/19/2019   CHF (congestive heart failure) (HCC) 06/19/2019    PCP: [name], [name], MD  REFERRING PROVIDER: Huel Cote, MD   REFERRING DIAG: [phone] (ICD-10-CM) - Rupture of right patellar tendon, initial encounter   THERAPY DIAG:  Stiffness of right knee, not elsewhere classified  Muscle weakness (generalized)  Other abnormalities of gait and mobility  Localized edema  Rationale for Evaluation and Treatment: Rehabilitation  ONSET DATE: 02/19/23, 03/03/23-right knee quadriceps tendon lengthening and patella tendon reconstruction with Achilles allograft   SUBJECTIVE:   SUBJECTIVE STATEMENT:  doing all right  PERTINENT HISTORY: Per referring physician note: PMHx: anemia, Blind L eye, CHF, CKD, stage 4, DM- on Dialysis, HLD, HTN, L patellar tendon repair about 20 years ago. Right knee with a proximately retracted patella. He is not able to actively extend against gravity. Range of motion of the right knee is from  approximately 20 degrees to 40 degrees. There is tenderness about the remaining trochlear groove.  53 y.o. 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  MRI does not show significant osteoarthritis and given that I do believe he would benefit from a VY lengthening of the quadriceps with the patella tendon reconstruction with allograft Achilles.   PAIN:  Are you having pain? Yes: NPRS scale: 0/10 Pain location: R knee Pain description: ache Aggravating factors: Worst at night Relieving factors: Ice  PRECAUTIONS: Knee,   RED FLAGS: None   WEIGHT BEARING RESTRICTIONS: Yes WBAT with brace and crutches  FALLS:  Has patient fallen in last 6 months? No  LIVING ENVIRONMENT: Lives with: lives with their family Lives in: House/apartment Stairs: No Has following equipment at home: None  OCCUPATION: On disability  PLOF: Independent  PATIENT GOALS: Patient would like to recover his ability to walk around normally.  NEXT MD VISIT: about 2 weeks  OBJECTIVE:  Note: Objective measures were completed at Evaluation unless otherwise noted.  DIAGNOSTIC FINDINGS:  03/03/23 Xray (4 views right knee): Evidence of an old patella tendon avulsion with significant retraction and osteoarthritis of the patella   MRI right knee: There is full-thickness chronic patella tendon rupture without significant patellofemoral cartilage loss  COGNITION: Overall cognitive status: Within functional limits for tasks assessed  SENSATION: WFL  EDEMA:  None observed  MUSCLE LENGTH: Hamstrings: WNL  POSTURE: Stands with weight shifted to LLE  PALPATION: Patient's dressings prevent full observation of knee, gentle patellar mobs performed, no pain  LOWER EXTREMITY ROM: LLE WNL  Passive ROM Right eval Left eval  Hip flexion    Hip extension    Hip abduction    Hip adduction    Hip internal rotation    Hip external rotation     Knee flexion 45   Knee extension 0   Ankle dorsiflexion 0   Ankle plantarflexion 52   Ankle inversion    Ankle eversion     (Blank rows = not tested)  LOWER EXTREMITY MMT: LLE 5/5, R hip at least 4/5, knee- active quad set- weak, ankle WNL  LOWER EXTREMITY SPECIAL TESTS:  N/T  FUNCTIONAL TESTS:  N/T  GAIT: Distance walked: In clinic distances Assistive device utilized: None Level of assistance: Modified independence Comments: Patient reports he did not receive crutches after surgery. Later reports that his girlfriend has crutches he can adjust. He had this same surgery on LLE about 20 years ago, reports he can safely use crutches, declined to practice in the clinic.                                                                                                                              TREATMENT DATE:  03/27/23 R knee patellar mobs  RLE SLR, SLR w/ ER, Hip Add, Hip abd, Hip Ext x 10 for two rounds  R ankle 4 way x 10 green  R knee PROM with end range mobs within protocol   03/20/23 Black bar heel and toe raises 2 sets 10 with brace on Resisted gait 4 ways 5 x each with brace on 30#- brace on Standing hip cable pulleys 4 way 5# 10 x BIL- brace on Feet on ball small bridge 2 sets 10 Feet on ball small HS curl < 60 degrees 2 sets 10, the isometric HS  Estim RUSSIAN 10 min quad set 5sec on/5 sec off STW to RT knee, quad and incision    03/18/23 Black bar heel and toe raises 2 sets 10 with brace on Resisted gait 4 ways 5 x each with brace on 20 # SLR 4 way lying 2 sets 10 Feet on ball small bridge 2 sets 10 Feet on ball small HS curl < 60 degrees 2 sets 10, the isometric HS  STW to RT knee, quad and incision ( okayed to massage scar at home ,instructed and he VU) tightness ITB and lateral quad  03/11/23 Quad set hold 3 sec 10x Quad set with min a SLR 2 sets 10 HS slide  to 30 degrees 10 x 2 sets HS press down 10x hold 3 sec Patellar mobs with good mobility and non  tender Assisted supine hip ABD and ADD 2 sets 10 Ankle DF,PF,INV,Ev red tband 2 sets 10   03/06/23  Education [employer], ankle pumps, HS to 45 degrees, SLR with max A Gentle patellar mobs  PATIENT EDUCATION:  Education details: POC, HEP Person educated: Patient Education method: Explanation Education comprehension: verbalized understanding and returned demonstration  HOME EXERCISE PROGRAM: [id number]  ASSESSMENT:  CLINICAL IMPRESSION: advancing ex per protocol with cuing needed. Difficult time getting R quad to contract  Patient is a 53 y.o. who was seen today for physical therapy evaluation and treatment for recover from R patellar tendon lengthening and repair. He is 3 days post op today. Arrived with no crutches, reports he will obtain some today, has used them in the past and is comfortable using them, declined education. Following protocol, initiated HEP, practiced and gave him written instructions. Plan to see him x 1 next week and then increase to 2x/week beginning the next week when his rehab expands, per protocol. He will benefit from PT as he progresses through his reha, for supervised progression of strengthening and ROM in order to maximize his recovery and promote return to his PLOF or beyond.   OBJECTIVE IMPAIRMENTS: Abnormal gait, decreased activity tolerance, decreased balance, decreased coordination, difficulty walking, decreased ROM, decreased strength, impaired flexibility, improper body mechanics, postural dysfunction, and pain.   ACTIVITY LIMITATIONS: carrying, lifting, bending, sitting, standing, squatting, sleeping, stairs, transfers, bathing, toileting, dressing, and locomotion level  PARTICIPATION LIMITATIONS: meal prep, cleaning, laundry, driving, shopping, and community activity  PERSONAL FACTORS: Past/current experiences are also affecting patient's functional outcome.   REHAB POTENTIAL: Good  CLINICAL DECISION MAKING: Evolving/moderate  complexity  EVALUATION COMPLEXITY: Low   GOALS: Goals reviewed with patient? Yes  SHORT TERM GOALS: Target date: 03/21/23 I with initial HEP Baseline: Goal status: 03/12/23 MET  LONG TERM GOALS: Target date: 05/29/23  I with final HEP Baseline:  Goal status: INITIAL  2.  Patient will recover R knee AROM to at least 0-120 degrees Baseline: 0-45 Goal status: INITIAL  3.  Patient will recover R knee strength to 5/5 Baseline: Active, but weak quad contraction achieved. Goal status: INITIAL  4.  Patient will perform dynamic SLS activities on RLE on unstable surface with no LOB Baseline:  Goal status: INITIAL  5.  Patient will be able to walk x at least 1000' on all surfaces, including up and down steps, I, no gait abnormalities Baseline:  Goal status: INITIAL  6.  Patient will be able to perform dynamic, power moves such as jumping, hopping, agility ladder drills with no pain or instability in R knee. Baseline:  Goal status: INITIAL   PLAN:  PT FREQUENCY: 1-2x/week  PT DURATION: 12 weeks  PLANNED INTERVENTIONS: 97110-Therapeutic exercises, 97530- Therapeutic activity, [id number]- Neuromuscular re-education, 97535- Self Care, [id number]- Manual therapy, [id number]- Gait training, 97014- Electrical stimulation (unattended), 97016- Vasopneumatic device, Patient/Family education, Balance training, Stair training, Taping, Dry Needling, Joint mobilization, Cryotherapy, and Moist heat  PLAN FOR NEXT SESSION: Proceed per protocol.     Grayce Sessions, PTA 03/27/2023, 7:52 AM

## 2023-03-28 ENCOUNTER — Other Ambulatory Visit: Payer: Self-pay

## 2023-03-28 DIAGNOSIS — D631 Anemia in chronic kidney disease: Secondary | ICD-10-CM | POA: Diagnosis not present

## 2023-03-28 DIAGNOSIS — N186 End stage renal disease: Secondary | ICD-10-CM | POA: Diagnosis not present

## 2023-03-28 DIAGNOSIS — N2581 Secondary hyperparathyroidism of renal origin: Secondary | ICD-10-CM | POA: Diagnosis not present

## 2023-03-28 DIAGNOSIS — Z992 Dependence on renal dialysis: Secondary | ICD-10-CM | POA: Diagnosis not present

## 2023-03-28 DIAGNOSIS — D689 Coagulation defect, unspecified: Secondary | ICD-10-CM | POA: Diagnosis not present

## 2023-03-28 DIAGNOSIS — E1122 Type 2 diabetes mellitus with diabetic chronic kidney disease: Secondary | ICD-10-CM | POA: Diagnosis not present

## 2023-03-31 DIAGNOSIS — D631 Anemia in chronic kidney disease: Secondary | ICD-10-CM | POA: Diagnosis not present

## 2023-03-31 DIAGNOSIS — N2581 Secondary hyperparathyroidism of renal origin: Secondary | ICD-10-CM | POA: Diagnosis not present

## 2023-03-31 DIAGNOSIS — Z992 Dependence on renal dialysis: Secondary | ICD-10-CM | POA: Diagnosis not present

## 2023-03-31 DIAGNOSIS — D689 Coagulation defect, unspecified: Secondary | ICD-10-CM | POA: Diagnosis not present

## 2023-03-31 DIAGNOSIS — E1122 Type 2 diabetes mellitus with diabetic chronic kidney disease: Secondary | ICD-10-CM | POA: Diagnosis not present

## 2023-03-31 DIAGNOSIS — N186 End stage renal disease: Secondary | ICD-10-CM | POA: Diagnosis not present

## 2023-04-01 ENCOUNTER — Ambulatory Visit (INDEPENDENT_AMBULATORY_CARE_PROVIDER_SITE_OTHER): Payer: 59 | Admitting: Podiatry

## 2023-04-01 ENCOUNTER — Encounter: Payer: Self-pay | Admitting: Podiatry

## 2023-04-01 VITALS — Ht 66.0 in | Wt 180.0 lb

## 2023-04-01 DIAGNOSIS — S86811A Strain of other muscle(s) and tendon(s) at lower leg level, right leg, initial encounter: Secondary | ICD-10-CM | POA: Diagnosis not present

## 2023-04-01 DIAGNOSIS — B351 Tinea unguium: Secondary | ICD-10-CM | POA: Diagnosis not present

## 2023-04-01 DIAGNOSIS — M79674 Pain in right toe(s): Secondary | ICD-10-CM

## 2023-04-01 DIAGNOSIS — N186 End stage renal disease: Secondary | ICD-10-CM

## 2023-04-01 DIAGNOSIS — Q828 Other specified congenital malformations of skin: Secondary | ICD-10-CM | POA: Diagnosis not present

## 2023-04-01 DIAGNOSIS — Z992 Dependence on renal dialysis: Secondary | ICD-10-CM | POA: Diagnosis not present

## 2023-04-01 DIAGNOSIS — E1122 Type 2 diabetes mellitus with diabetic chronic kidney disease: Secondary | ICD-10-CM | POA: Diagnosis not present

## 2023-04-01 DIAGNOSIS — Z794 Long term (current) use of insulin: Secondary | ICD-10-CM | POA: Diagnosis not present

## 2023-04-01 DIAGNOSIS — M79675 Pain in left toe(s): Secondary | ICD-10-CM

## 2023-04-02 DIAGNOSIS — N2581 Secondary hyperparathyroidism of renal origin: Secondary | ICD-10-CM | POA: Diagnosis not present

## 2023-04-02 DIAGNOSIS — D689 Coagulation defect, unspecified: Secondary | ICD-10-CM | POA: Diagnosis not present

## 2023-04-02 DIAGNOSIS — E1122 Type 2 diabetes mellitus with diabetic chronic kidney disease: Secondary | ICD-10-CM | POA: Diagnosis not present

## 2023-04-02 DIAGNOSIS — N186 End stage renal disease: Secondary | ICD-10-CM | POA: Diagnosis not present

## 2023-04-02 DIAGNOSIS — D631 Anemia in chronic kidney disease: Secondary | ICD-10-CM | POA: Diagnosis not present

## 2023-04-02 DIAGNOSIS — Z992 Dependence on renal dialysis: Secondary | ICD-10-CM | POA: Diagnosis not present

## 2023-04-03 ENCOUNTER — Ambulatory Visit: Payer: 59 | Admitting: Physical Therapy

## 2023-04-03 ENCOUNTER — Encounter: Payer: Self-pay | Admitting: Physical Therapy

## 2023-04-03 DIAGNOSIS — R600 Localized edema: Secondary | ICD-10-CM | POA: Diagnosis not present

## 2023-04-03 DIAGNOSIS — M6281 Muscle weakness (generalized): Secondary | ICD-10-CM

## 2023-04-03 DIAGNOSIS — R2681 Unsteadiness on feet: Secondary | ICD-10-CM | POA: Diagnosis not present

## 2023-04-03 DIAGNOSIS — R2689 Other abnormalities of gait and mobility: Secondary | ICD-10-CM | POA: Diagnosis not present

## 2023-04-03 DIAGNOSIS — M25661 Stiffness of right knee, not elsewhere classified: Secondary | ICD-10-CM | POA: Diagnosis not present

## 2023-04-03 NOTE — Therapy (Signed)
 OUTPATIENT PHYSICAL THERAPY LOWER EXTREMITY    Patient Name: Timothy [name] MRN: [id number] DOB:1970/06/13, 53 y.o., male Today's Date: 04/03/2023  END OF SESSION:  PT End of Session - 04/03/23 0931     Visit Number 6    Date for PT Re-Evaluation 05/29/23    PT Start Time 0930    PT Stop Time 1015    PT Time Calculation (min) 45 min    Activity Tolerance Patient tolerated treatment well    Behavior During Therapy WFL for tasks assessed/performed             Past Medical History:  Diagnosis Date   Allergies    Anemia    Blind left eye    CHF (congestive heart failure) (HCC)    Chronic kidney disease    ESRD - HD M,W,F   Diabetes mellitus without complication (HCC)    Type2   Dyspnea    Patient denies SOB since he started dialysis. States he went away after he started dialysis   HLD (hyperlipidemia)    Hypertension    Pneumonia    x2   Past Surgical History:  Procedure Laterality Date   A/V FISTULAGRAM Left 11/19/2022   Procedure: A/V Fistulagram;  Surgeon: Nada Libman, MD;  Location: [facility];  Service: Cardiovascular;  Laterality: Left;   AV FISTULA PLACEMENT Left 09/19/2022   Procedure: ARTERIOVENOUS (AV) FISTULA CREATION;  Surgeon: Nada Libman, MD;  Location: [facility] OR;  Service: Vascular;  Laterality: Left;   BIOPSY  12/05/2022   Procedure: BIOPSY;  Surgeon: Napoleon Form, MD;  Location: [facility];  Service: Gastroenterology;;   CATARACT EXTRACTION Right    COLONOSCOPY WITH PROPOFOL N/A 12/05/2022   Procedure: COLONOSCOPY WITH PROPOFOL;  Surgeon: Napoleon Form, MD;  Location: [facility];  Service: Gastroenterology;  Laterality: N/A;   ESOPHAGOGASTRODUODENOSCOPY (EGD) WITH PROPOFOL N/A 12/05/2022   Procedure: ESOPHAGOGASTRODUODENOSCOPY (EGD) WITH PROPOFOL;  Surgeon: Napoleon Form, MD;  Location: [facility];  Service: Gastroenterology;  Laterality: N/A;   FRACTURE SURGERY Left    [place]   KNEE RECONSTRUCTION Right  03/03/2023   Procedure: RIGHT KNEE QUADRICEPS TENDON LENGTHENING AND PATELLA TENDON RECONSTRUCTION WITH ACHILLES ALLOGRAFT;  Surgeon: Huel Cote, MD;  Location: [facility] OR;  Service: Orthopedics;  Laterality: Right;   LIGATION OF COMPETING BRANCHES OF ARTERIOVENOUS FISTULA Left 03/11/2023   Procedure: LIGATION OF COMPETING BRANCHES OF LEFT ARTERIOVENOUS FISTULA;  Surgeon: Daria Pastures, MD;  Location: [facility] OR;  Service: Vascular;  Laterality: Left;   MENISCUS REPAIR Left    PERIPHERAL VASCULAR INTERVENTION  11/19/2022   Procedure: PERIPHERAL VASCULAR INTERVENTION;  Surgeon: Nada Libman, MD;  Location: [facility];  Service: Cardiovascular;;  Embolization coil   POLYPECTOMY  12/05/2022   Procedure: POLYPECTOMY;  Surgeon: Napoleon Form, MD;  Location: [facility];  Service: Gastroenterology;;   Patient Active Problem List   Diagnosis Date Noted   Rupture of right patellar tendon 03/03/2023   Other disorders of phosphorus metabolism 01/04/2023   Heme positive stool 12/05/2022   Generalized abdominal discomfort 12/05/2022   Polyp of ascending colon 12/05/2022   Gastritis and gastroduodenitis 12/05/2022   Gastric polyp 12/05/2022   Foot callus 12/03/2022   Iron deficiency anemia, unspecified 11/01/2022   Angiodysplasia of stomach and duodenum with bleeding 09/06/2022   Other iron deficiency anemias 08/30/2022   Allergy, unspecified, initial encounter 08/20/2022   Anaphylactic reaction due to adverse effect of correct drug or medicament properly administered, initial  encounter 08/20/2022   Anaphylactic shock, unspecified, initial encounter 08/20/2022   Anemia in chronic kidney disease 08/20/2022   Coagulation defect, unspecified (HCC) 08/20/2022   Diarrhea, unspecified 08/20/2022   Encounter for immunization 08/20/2022   Encounter for screening for respiratory tuberculosis 08/20/2022   End stage renal disease (HCC) 08/20/2022   Fever, unspecified 08/20/2022   Nausea  08/20/2022   Pain, unspecified 08/20/2022   Pruritus, unspecified 08/20/2022   Dependence on renal dialysis (HCC) 08/16/2022   Hyperlipidemia, unspecified 08/16/2022   Long term (current) use of insulin (HCC) 08/16/2022   Secondary hyperparathyroidism of renal origin (HCC) 08/16/2022   Vitamin D deficiency, unspecified 08/16/2022   Hematemesis 08/05/2022   Erectile dysfunction 01/18/2021   Pneumonia 11/15/2020   Hemoptysis 11/15/2020   CKD (chronic kidney disease), stage IV (HCC) 11/15/2020   Positive D dimer 11/15/2020   Knee injury, right, subsequent encounter 09/07/2020   Type 2 diabetes mellitus (HCC) 08/22/2020   Acute respiratory failure with hypoxia (HCC) 06/22/2020   Multifocal pneumonia 06/22/2020   Accelerated hypertension 06/20/2019   Elevated troponin 06/19/2019   Chronic diastolic CHF (congestive heart failure) (HCC) 06/19/2019   Hypoalbuminemia 06/19/2019   Hypertensive urgency 06/19/2019   AKI (acute kidney injury) (HCC) 06/19/2019   CHF (congestive heart failure) (HCC) 06/19/2019    PCP: [name], [name], MD  REFERRING PROVIDER: Huel Cote, MD   REFERRING DIAG: [phone] (ICD-10-CM) - Rupture of right patellar tendon, initial encounter   THERAPY DIAG:  Stiffness of right knee, not elsewhere classified  Muscle weakness (generalized)  Other abnormalities of gait and mobility  Localized edema  Unsteadiness on feet  Rationale for Evaluation and Treatment: Rehabilitation  ONSET DATE: 02/19/23, 03/03/23-right knee quadriceps tendon lengthening and patella tendon reconstruction with Achilles allograft   SUBJECTIVE:   SUBJECTIVE STATEMENT:  "All right"  PERTINENT HISTORY: Per referring physician note: PMHx: anemia, Blind L eye, CHF, CKD, stage 4, DM- on Dialysis, HLD, HTN, L patellar tendon repair about 20 years ago. Right knee with a proximately retracted patella. He is not able to actively extend against gravity. Range of motion of the right knee  is from approximately 20 degrees to 40 degrees. There is tenderness about the remaining trochlear groove.  53 y.o. 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  MRI does not show significant osteoarthritis and given that I do believe he would benefit from a VY lengthening of the quadriceps with the patella tendon reconstruction with allograft Achilles.   PAIN:  Are you having pain? Yes: NPRS scale: 0/10 Pain location: R knee Pain description: ache Aggravating factors: Worst at night Relieving factors: Ice  PRECAUTIONS: Knee,   RED FLAGS: None   WEIGHT BEARING RESTRICTIONS: Yes WBAT with brace and crutches  FALLS:  Has patient fallen in last 6 months? No  LIVING ENVIRONMENT: Lives with: lives with their family Lives in: House/apartment Stairs: No Has following equipment at home: None  OCCUPATION: On disability  PLOF: Independent  PATIENT GOALS: Patient would like to recover his ability to walk around normally.  NEXT MD VISIT: about 2 weeks  OBJECTIVE:  Note: Objective measures were completed at Evaluation unless otherwise noted.  DIAGNOSTIC FINDINGS:  03/03/23 Xray (4 views right knee): Evidence of an old patella tendon avulsion with significant retraction and osteoarthritis of the patella   MRI right knee: There is full-thickness chronic patella tendon rupture without significant patellofemoral cartilage loss  COGNITION: Overall cognitive status: Within functional limits for tasks  assessed     SENSATION: WFL  EDEMA:  None observed  MUSCLE LENGTH: Hamstrings: WNL  POSTURE: Stands with weight shifted to LLE  PALPATION: Patient's dressings prevent full observation of knee, gentle patellar mobs performed, no pain  LOWER EXTREMITY ROM: LLE WNL  Passive ROM Right eval Left eval  Hip flexion    Hip extension    Hip abduction    Hip adduction    Hip internal rotation    Hip external  rotation    Knee flexion 45   Knee extension 0   Ankle dorsiflexion 0   Ankle plantarflexion 52   Ankle inversion    Ankle eversion     (Blank rows = not tested)  LOWER EXTREMITY MMT: LLE 5/5, R hip at least 4/5, knee- active quad set- weak, ankle WNL  LOWER EXTREMITY SPECIAL TESTS:  N/T  FUNCTIONAL TESTS:  N/T  GAIT: Distance walked: In clinic distances Assistive device utilized: None Level of assistance: Modified independence Comments: Patient reports he did not receive crutches after surgery. Later reports that his girlfriend has crutches he can adjust. He had this same surgery on LLE about 20 years ago, reports he can safely use crutches, declined to practice in the clinic.                                                                                                                              TREATMENT DATE:  04/03/23 R knee patellar mobs  RLE SLR, SLR w/ ER, Hip Add, Hip abd, Hip Ext x 10 for two rounds  R ankle 4 way x 10 green  R knee PROM with end range mobs within protocol Standing toe and heel raises on black bar 2x10 Alt box taps 4in Gait 314ft focusing on heel strike, hip and knee flex  03/27/23 R knee patellar mobs  RLE SLR, SLR w/ ER, Hip Add, Hip abd, Hip Ext x 10 for two rounds  R ankle 4 way x 10 green  R knee PROM with end range mobs within protocol   03/20/23 Black bar heel and toe raises 2 sets 10 with brace on Resisted gait 4 ways 5 x each with brace on 30#- brace on Standing hip cable pulleys 4 way 5# 10 x BIL- brace on Feet on ball small bridge 2 sets 10 Feet on ball small HS curl < 60 degrees 2 sets 10, the isometric HS  Estim RUSSIAN 10 min quad set 5sec on/5 sec off STW to RT knee, quad and incision    03/18/23 Black bar heel and toe raises 2 sets 10 with brace on Resisted gait 4 ways 5 x each with brace on 20 # SLR 4 way lying 2 sets 10 Feet on ball small bridge 2 sets 10 Feet on ball small HS curl < 60 degrees 2 sets 10, the  isometric HS  STW to RT knee, quad and incision ( okayed to massage scar at home ,instructed  and he VU) tightness ITB and lateral quad  03/11/23 Quad set hold 3 sec 10x Quad set with min a SLR 2 sets 10 HS slide  to 30 degrees 10 x 2 sets HS press down 10x hold 3 sec Patellar mobs with good mobility and non tender Assisted supine hip ABD and ADD 2 sets 10 Ankle DF,PF,INV,Ev red tband 2 sets 10   03/06/23 Education Quad sets, ankle pumps, HS to 45 degrees, SLR with max A Gentle patellar mobs  PATIENT EDUCATION:  Education details: POC, HEP Person educated: Patient Education method: Explanation Education comprehension: verbalized understanding and returned demonstration  HOME EXERCISE PROGRAM: [email or web address]  ASSESSMENT:  CLINICAL IMPRESSION: advancing ex per protocol with cuing needed. Difficult time getting R quad to contract. Pt has a difficult time with side lying hip adduction due to weakness. Brace is unlocked form MD, but pt has been ambulating stiff legged. Time spent on gait allowing RLE to bend.  Patient is a 53 y.o. who was seen today for physical therapy evaluation and treatment for recover from R patellar tendon lengthening and repair. He is 3 days post op today. Arrived with no crutches, reports he will obtain some today, has used them in the past and is comfortable using them, declined education. Following protocol, initiated HEP, practiced and gave him written instructions. Plan to see him x 1 next week and then increase to 2x/week beginning the next week when his rehab expands, per protocol. He will benefit from PT as he progresses through his reha, for supervised progression of strengthening and ROM in order to maximize his recovery and promote return to his PLOF or beyond.   OBJECTIVE IMPAIRMENTS: Abnormal gait, decreased activity tolerance, decreased balance, decreased coordination, difficulty walking, decreased ROM, decreased strength, impaired flexibility, improper body  mechanics, postural dysfunction, and pain.   ACTIVITY LIMITATIONS: carrying, lifting, bending, sitting, standing, squatting, sleeping, stairs, transfers, bathing, toileting, dressing, and locomotion level  PARTICIPATION LIMITATIONS: meal prep, cleaning, laundry, driving, shopping, and community activity  PERSONAL FACTORS: Past/current experiences are also affecting patient's functional outcome.   REHAB POTENTIAL: Good  CLINICAL DECISION MAKING: Evolving/moderate complexity  EVALUATION COMPLEXITY: Low   GOALS: Goals reviewed with patient? Yes  SHORT TERM GOALS: Target date: 03/21/23 I with initial HEP Baseline: Goal status: 03/12/23 MET  LONG TERM GOALS: Target date: 05/29/23  I with final HEP Baseline:  Goal status: INITIAL  2.  Patient will recover R knee AROM to at least 0-120 degrees Baseline: 0-45 Goal status: INITIAL  3.  Patient will recover R knee strength to 5/5 Baseline: Active, but weak quad contraction achieved. Goal status: INITIAL  4.  Patient will perform dynamic SLS activities on RLE on unstable surface with no LOB Baseline:  Goal status: INITIAL  5.  Patient will be able to walk x at least 1000' on all surfaces, including up and down steps, I, no gait abnormalities Baseline:  Goal status: INITIAL  6.  Patient will be able to perform dynamic, power moves such as jumping, hopping, agility ladder drills with no pain or instability in R knee. Baseline:  Goal status: INITIAL   PLAN:  PT FREQUENCY: 1-2x/week  PT DURATION: 12 weeks  PLANNED INTERVENTIONS: 97110-Therapeutic exercises, 97530- Therapeutic activity, [id number]- Neuromuscular re-education, 97535- Self Care, [id number]- Manual therapy, [id number]- Gait training, 97014- Electrical stimulation (unattended), 97016- Vasopneumatic device, Patient/Family education, Balance training, Stair training, Taping, Dry Needling, Joint mobilization, Cryotherapy, and Moist heat  PLAN FOR NEXT SESSION: Proceed per  protocol.  Grayce Sessions, PTA 04/03/2023, 9:31 AM

## 2023-04-04 ENCOUNTER — Ambulatory Visit (HOSPITAL_COMMUNITY): Payer: 59 | Attending: Vascular Surgery

## 2023-04-04 DIAGNOSIS — D631 Anemia in chronic kidney disease: Secondary | ICD-10-CM | POA: Diagnosis not present

## 2023-04-04 DIAGNOSIS — Z992 Dependence on renal dialysis: Secondary | ICD-10-CM | POA: Diagnosis not present

## 2023-04-04 DIAGNOSIS — N186 End stage renal disease: Secondary | ICD-10-CM | POA: Diagnosis not present

## 2023-04-04 DIAGNOSIS — N2581 Secondary hyperparathyroidism of renal origin: Secondary | ICD-10-CM | POA: Diagnosis not present

## 2023-04-04 DIAGNOSIS — E1122 Type 2 diabetes mellitus with diabetic chronic kidney disease: Secondary | ICD-10-CM | POA: Diagnosis not present

## 2023-04-04 DIAGNOSIS — D689 Coagulation defect, unspecified: Secondary | ICD-10-CM | POA: Diagnosis not present

## 2023-04-04 NOTE — Progress Notes (Signed)
  Subjective:  Patient ID: Timothy [name], male    DOB: 12/20/1970,  MRN: [id number]  Timothy [name] presents to clinic today for: at risk foot care with h/o NIDDM with ESRD on hemodialysis and painful porokeratotic lesion(s) left lower extremity and painful mycotic toenails that limit ambulation. Painful toenails interfere with ambulation. Aggravating factors include wearing enclosed shoe gear. Pain is relieved with periodic professional debridement. Painful porokeratotic lesions are aggravated when weightbearing with and without shoegear. Pain is relieved with periodic professional debridement.  Chief Complaint  Patient presents with   Nail Problem    Pt is here for [facility] last A1C was 7 PCP is Dr Tommi Rumps [place] and LOV was in January.    PCP is [name], Buren Kos, MD.  No Known Allergies  Review of Systems: Negative except as noted in the HPI.  Objective: No changes noted in today's physical examination. There were no vitals filed for this visit.  Timothy [name] is a pleasant 53 y.o. male in NAD. AAO x 3.  Vascular Examination: Capillary refill time <3 seconds b/l LE. Palpable pedal pulses b/l LE. Digital hair present b/l. No pedal edema b/l. Skin temperature gradient WNL b/l. No varicosities b/l. No ischemia or gangrene noted b/l LE. No cyanosis or clubbing noted b/l LE.Marland Kitchen  Dermatological Examination: Pedal skin with normal turgor, texture and tone b/l. No open wounds. No interdigital macerations b/l. Toenails 1-5 b/l thickened, discolored, dystrophic with subungual debris. There is pain on palpation to dorsal aspect of nailplates. Porokeratotic lesion(s) submet head 3 left foot. No erythema, no edema, no drainage, no fluctuance..  Neurological Examination: Protective sensation intact with 10 gram monofilament b/l LE. Vibratory sensation intact b/l LE.   Musculoskeletal Examination: Muscle strength 5/5 to all lower extremity muscle groups bilaterally. Plantarflexed  metatarsal(s) 2nd metatarsal head left foot and 5th metatarsal head right foot.     Latest Ref Rng & Units 03/04/2023    8:25 AM 02/25/2023   11:17 AM 08/05/2022    3:07 PM  Hemoglobin A1C  Hemoglobin-A1c 4.0 - 5.6 % 6.5  6.0  5.8    Assessment/Plan: 1. Pain due to onychomycosis of toenails of both feet   2. Porokeratosis   3. Type 2 diabetes mellitus with chronic kidney disease on chronic dialysis, with long-term current use of insulin (HCC)     -Patient was evaluated today. All questions/concerns addressed on today's visit. -Continue foot and shoe inspections daily. Monitor blood glucose per PCP/Endocrinologist's recommendations. -Patient to continue soft, supportive shoe gear daily. -Mycotic toenails 1-5 bilaterally were debrided in length and girth with sterile nail nippers and dremel without incident. -Porokeratotic lesion(s) submet head 3 left foot pared and enucleated with sterile currette without incident. Total number of lesions debrided=1. -Patient/POA to call should there be question/concern in the interim.   Return in about 3 months (around 06/29/2023).  Freddie Breech, DPM       LOCATION: [address], [place] [address]                   Office [phone]   [facility] LOCATION: [address] [place], [place] [address] Office [phone]

## 2023-04-07 DIAGNOSIS — D689 Coagulation defect, unspecified: Secondary | ICD-10-CM | POA: Diagnosis not present

## 2023-04-07 DIAGNOSIS — D631 Anemia in chronic kidney disease: Secondary | ICD-10-CM | POA: Diagnosis not present

## 2023-04-07 DIAGNOSIS — R52 Pain, unspecified: Secondary | ICD-10-CM | POA: Diagnosis not present

## 2023-04-07 DIAGNOSIS — Z992 Dependence on renal dialysis: Secondary | ICD-10-CM | POA: Diagnosis not present

## 2023-04-07 DIAGNOSIS — N186 End stage renal disease: Secondary | ICD-10-CM | POA: Diagnosis not present

## 2023-04-07 DIAGNOSIS — N2581 Secondary hyperparathyroidism of renal origin: Secondary | ICD-10-CM | POA: Diagnosis not present

## 2023-04-08 ENCOUNTER — Ambulatory Visit: Payer: 59 | Attending: Family Medicine | Admitting: Physical Therapy

## 2023-04-08 DIAGNOSIS — M6281 Muscle weakness (generalized): Secondary | ICD-10-CM | POA: Insufficient documentation

## 2023-04-08 DIAGNOSIS — R2681 Unsteadiness on feet: Secondary | ICD-10-CM | POA: Diagnosis not present

## 2023-04-08 DIAGNOSIS — R600 Localized edema: Secondary | ICD-10-CM | POA: Diagnosis not present

## 2023-04-08 DIAGNOSIS — M25661 Stiffness of right knee, not elsewhere classified: Secondary | ICD-10-CM | POA: Diagnosis not present

## 2023-04-08 NOTE — Therapy (Signed)
 OUTPATIENT PHYSICAL THERAPY LOWER EXTREMITY    Patient Name: Timothy [name] MRN: [id number] DOB:[birth year], 53 y.o., male Today's Date: 04/08/2023  END OF SESSION:  PT End of Session - 04/08/23 0801     Visit Number 7    Date for PT Re-Evaluation 05/29/23    PT Start Time 0800    PT Stop Time 0840    PT Time Calculation (min) 40 min             Past Medical History:  Diagnosis Date   Allergies    Anemia    Blind left eye    CHF (congestive heart failure) (HCC)    Chronic kidney disease    ESRD - HD M,W,F   Diabetes mellitus without complication (HCC)    Type2   Dyspnea    Patient denies SOB since he started dialysis. States he went away after he started dialysis   HLD (hyperlipidemia)    Hypertension    Pneumonia    x2   Past Surgical History:  Procedure Laterality Date   A/V FISTULAGRAM Left 11/19/2022   Procedure: A/V Fistulagram;  Surgeon: Nada Libman, MD;  [facility];  Service: Cardiovascular;  Laterality: Left;   AV FISTULA PLACEMENT Left 09/19/2022   Procedure: ARTERIOVENOUS (AV) FISTULA CREATION;  Surgeon: Nada Libman, MD;  Location: [facility] OR;  Service: Vascular;  Laterality: Left;   BIOPSY  12/05/2022   Procedure: BIOPSY;  Surgeon: Napoleon Form, MD;  Location: [facility];  Service: Gastroenterology;;   CATARACT EXTRACTION Right    COLONOSCOPY WITH PROPOFOL N/A 12/05/2022   Procedure: COLONOSCOPY WITH PROPOFOL;  Surgeon: Napoleon Form, MD;  Location: [facility];  Service: Gastroenterology;  Laterality: N/A;   ESOPHAGOGASTRODUODENOSCOPY (EGD) WITH PROPOFOL N/A 12/05/2022   Procedure: ESOPHAGOGASTRODUODENOSCOPY (EGD) WITH PROPOFOL;  Surgeon: Napoleon Form, MD;  [facility]: [facility];  Service: Gastroenterology;  Laterality: N/A;   FRACTURE SURGERY Left    [place]   KNEE RECONSTRUCTION Right 03/03/2023   Procedure: RIGHT KNEE QUADRICEPS TENDON LENGTHENING AND PATELLA TENDON RECONSTRUCTION WITH ACHILLES  ALLOGRAFT;  Surgeon: Huel Cote, MD;  Location: [facility];  Service: Orthopedics;  Laterality: Right;   LIGATION OF COMPETING BRANCHES OF ARTERIOVENOUS FISTULA Left 03/11/2023   Procedure: LIGATION OF COMPETING BRANCHES OF LEFT ARTERIOVENOUS FISTULA;  Surgeon: Daria Pastures, MD;  Location: [facility] OR;  Service: Vascular;  Laterality: Left;   MENISCUS REPAIR Left    PERIPHERAL VASCULAR INTERVENTION  11/19/2022   Procedure: PERIPHERAL VASCULAR INTERVENTION;  Surgeon: Nada Libman, MD;  [facility];  Service: Cardiovascular;;  Embolization coil   POLYPECTOMY  12/05/2022   Procedure: POLYPECTOMY;  Surgeon: Napoleon Form, MD;  Location: [facility];  Service: Gastroenterology;;   Patient Active Problem List   Diagnosis Date Noted   Rupture of right patellar tendon 03/03/2023   Other disorders of phosphorus metabolism 01/04/2023   Heme positive stool 12/05/2022   Generalized abdominal discomfort 12/05/2022   Polyp of ascending colon 12/05/2022   Gastritis and gastroduodenitis 12/05/2022   Gastric polyp 12/05/2022   Foot callus 12/03/2022   Iron deficiency anemia, unspecified 11/01/2022   Angiodysplasia of stomach and duodenum with bleeding 09/06/2022   Other iron deficiency anemias 08/30/2022   Allergy, unspecified, initial encounter 08/20/2022   Anaphylactic reaction due to adverse effect of correct drug or medicament properly administered, initial encounter 08/20/2022   Anaphylactic shock, unspecified, initial encounter 08/20/2022   Anemia in chronic kidney disease 08/20/2022  Coagulation defect, unspecified (HCC) 08/20/2022   Diarrhea, unspecified 08/20/2022   Encounter for immunization 08/20/2022   Encounter for screening for respiratory tuberculosis 08/20/2022   End stage renal disease (HCC) 08/20/2022   Fever, unspecified 08/20/2022   Nausea 08/20/2022   Pain, unspecified 08/20/2022   Pruritus, unspecified 08/20/2022   Dependence on renal dialysis (HCC)  08/16/2022   Hyperlipidemia, unspecified 08/16/2022   Long term (current) use of insulin (HCC) 08/16/2022   Secondary hyperparathyroidism of renal origin (HCC) 08/16/2022   Vitamin D deficiency, unspecified 08/16/2022   Hematemesis 08/05/2022   Erectile dysfunction 01/18/2021   Pneumonia 11/15/2020   Hemoptysis 11/15/2020   CKD (chronic kidney disease), stage IV (HCC) 11/15/2020   Positive D dimer 11/15/2020   Knee injury, right, subsequent encounter 09/07/2020   Type 2 diabetes mellitus (HCC) 08/22/2020   Acute respiratory failure with hypoxia (HCC) 06/22/2020   Multifocal pneumonia 06/22/2020   Accelerated hypertension 06/20/2019   Elevated troponin 06/19/2019   Chronic diastolic CHF (congestive heart failure) (HCC) 06/19/2019   Hypoalbuminemia 06/19/2019   Hypertensive urgency 06/19/2019   AKI (acute kidney injury) (HCC) 06/19/2019   CHF (congestive heart failure) (HCC) 06/19/2019    PCP: [name], [name], MD  REFERRING PROVIDER: Huel Cote, MD   REFERRING DIAG: [phone] (ICD-10-CM) - Rupture of right patellar tendon, initial encounter   THERAPY DIAG:  Stiffness of right knee, not elsewhere classified  Muscle weakness (generalized)  Rationale for Evaluation and Treatment: Rehabilitation  ONSET DATE: 02/19/23, 03/03/23-right knee quadriceps tendon lengthening and patella tendon reconstruction with Achilles allograft   SUBJECTIVE:   SUBJECTIVE STATEMENT: 5 weeks post op. Knee is alright  PERTINENT HISTORY: Per referring physician note: PMHx: anemia, Blind L eye, CHF, CKD, stage 4, DM- on Dialysis, HLD, HTN, L patellar tendon repair about 20 years ago. Right knee with a proximately retracted patella. He is not able to actively extend against gravity. Range of motion of the right knee is from approximately 20 degrees to 40 degrees. There is tenderness about the remaining trochlear groove.  53 y.o. 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  MRI does not show significant osteoarthritis and given that I do believe he would benefit from a VY lengthening of the quadriceps with the patella tendon reconstruction with allograft Achilles.   PAIN:  Are you having pain? Yes: NPRS scale: 5/10 Pain location: R knee Pain description: ache Aggravating factors: Worst at night Relieving factors: Ice  PRECAUTIONS: Knee,   RED FLAGS: None   WEIGHT BEARING RESTRICTIONS: Yes WBAT with brace and crutches  FALLS:  Has patient fallen in last 6 months? No  LIVING ENVIRONMENT: Lives with: lives with their family Lives in: House/apartment Stairs: No Has following equipment at home: None  OCCUPATION: On disability  PLOF: Independent  PATIENT GOALS: Patient would like to recover his ability to walk around normally.  NEXT MD VISIT: about 2 weeks  OBJECTIVE:  Note: Objective measures were completed at Evaluation unless otherwise noted.  DIAGNOSTIC FINDINGS:  03/03/23 Xray (4 views right knee): Evidence of an old patella tendon avulsion with significant retraction and osteoarthritis of the patella   MRI right knee: There is full-thickness chronic patella tendon rupture without significant patellofemoral cartilage loss  COGNITION: Overall cognitive status: Within functional limits for tasks assessed     SENSATION: WFL  EDEMA:  None observed  MUSCLE LENGTH: Hamstrings: WNL  POSTURE: Stands with weight shifted to LLE  PALPATION: Patient's dressings prevent  full observation of knee, gentle patellar mobs performed, no pain  LOWER EXTREMITY ROM: LLE WNL  Passive ROM Right eval Left eval  Hip flexion    Hip extension    Hip abduction    Hip adduction    Hip internal rotation    Hip external rotation    Knee flexion 45   Knee extension 0   Ankle dorsiflexion 0   Ankle plantarflexion 52   Ankle inversion    Ankle eversion     (Blank rows = not tested)  LOWER EXTREMITY  MMT: LLE 5/5, R hip at least 4/5, knee- active quad set- weak, ankle WNL  LOWER EXTREMITY SPECIAL TESTS:  N/T  FUNCTIONAL TESTS:  N/T  GAIT: Distance walked: In clinic distances Assistive device utilized: None Level of assistance: Modified independence Comments: Patient reports he did not receive crutches after surgery. Later reports that his girlfriend has crutches he can adjust. He had this same surgery on LLE about 20 years ago, reports he can safely use crutches, declined to practice in the clinic.                                                                                                                              TREATMENT DATE:   04/08/23 AROM sitting EOB SOC 65, after PROM per protocol AROM 85, 90 pass as per protocol Seated HS red tband 2 sets 10 RLE SLR, SLR w/ ER, Hip Add, Hip abd, Hip Ext x 10 with 2# and cued to activate and engage quad 1st. 2nd set no wt- quad lag with SLRs Alt box taps 4in 20 x 2 sets - PTA supporting RT knee Standing toe and heel raises on black bar 2x10 Resisted gait 4 ways 5 x each with brace on 30#- brace on Gait 331ft focusing on heel strike, hip and knee flex  04/03/23 R knee patellar mobs  RLE SLR, SLR w/ ER, Hip Add, Hip abd, Hip Ext x 10 for two rounds  R ankle 4 way x 10 green  R knee PROM with end range mobs within protocol Standing toe and heel raises on black bar 2x10 Alt box taps 4in Gait 382ft focusing on heel strike, hip and knee flex  03/27/23 R knee patellar mobs  RLE SLR, SLR w/ ER, Hip Add, Hip abd, Hip Ext x 10 for two rounds  R ankle 4 way x 10 green  R knee PROM with end range mobs within protocol   03/20/23 Black bar heel and toe raises 2 sets 10 with brace on Resisted gait 4 ways 5 x each with brace on 30#- brace on Standing hip cable pulleys 4 way 5# 10 x BIL- brace on Feet on ball small bridge 2 sets 10 Feet on ball small HS curl < 60 degrees 2 sets 10, the isometric HS  Estim RUSSIAN 10 min quad set 5sec on/5  sec off STW to RT knee, quad and incision  03/18/23 Black bar heel and toe raises 2 sets 10 with brace on Resisted gait 4 ways 5 x each with brace on 20 # SLR 4 way lying 2 sets 10 Feet on ball small bridge 2 sets 10 Feet on ball small HS curl < 60 degrees 2 sets 10, the isometric HS  STW to RT knee, quad and incision ( okayed to massage scar at home ,instructed and he VU) tightness ITB and lateral quad  03/11/23 Quad set hold 3 sec 10x Quad set with min a SLR 2 sets 10 HS slide  to 30 degrees 10 x 2 sets HS press down 10x hold 3 sec Patellar mobs with good mobility and non tender Assisted supine hip ABD and ADD 2 sets 10 Ankle DF,PF,INV,Ev red tband 2 sets 10   03/06/23 Education Quad sets, ankle pumps, HS to 45 degrees, SLR with max A Gentle patellar mobs  PATIENT EDUCATION:  Education details: POC, HEP Person educated: Patient Education method: Explanation Education comprehension: verbalized understanding and returned demonstration  HOME EXERCISE PROGRAM: [id number]  ASSESSMENT:  CLINICAL IMPRESSION: checked ROM and goals and documented. Progressed ROM and ex within protocol - he is 5 weeks so still limited. Some resistance and pt needed cuing to prevent compensations.  Continue to cue for better gait- he does well with cuing but without reverts back to many compensations.   OBJECTIVE IMPAIRMENTS: Abnormal gait, decreased activity tolerance, decreased balance, decreased coordination, difficulty walking, decreased ROM, decreased strength, impaired flexibility, improper body mechanics, postural dysfunction, and pain.   ACTIVITY LIMITATIONS: carrying, lifting, bending, sitting, standing, squatting, sleeping, stairs, transfers, bathing, toileting, dressing, and locomotion level  PARTICIPATION LIMITATIONS: meal prep, cleaning, laundry, driving, shopping, and community activity  PERSONAL FACTORS: Past/current experiences are also affecting patient's functional outcome.    REHAB POTENTIAL: Good  CLINICAL DECISION MAKING: Evolving/moderate complexity  EVALUATION COMPLEXITY: Low   GOALS: Goals reviewed with patient? Yes  SHORT TERM GOALS: Target date: 03/21/23 I with initial HEP Baseline: Goal status: 03/12/23 MET  LONG TERM GOALS: Target date: 05/29/23  I with final HEP Baseline:  Goal status: INITIAL  2.  Patient will recover R knee AROM to at least 0-120 degrees Baseline: 0-45 Goal status: 04/08/23 progressing  3.  Patient will recover R knee strength to 5/5 Baseline: Active, but weak quad contraction achieved. Goal status: INITIAL  4.  Patient will perform dynamic SLS activities on RLE on unstable surface with no LOB Baseline:  Goal status: INITIAL  5.  Patient will be able to walk x at least 1000' on all surfaces, including up and down steps, I, no gait abnormalities Baseline:  Goal status: 04/08/23 progressing  6.  Patient will be able to perform dynamic, power moves such as jumping, hopping, agility ladder drills with no pain or instability in R knee. Baseline:  Goal status: INITIAL   PLAN:  PT FREQUENCY: 1-2x/week  PT DURATION: 12 weeks  PLANNED INTERVENTIONS: 97110-Therapeutic exercises, 97530- Therapeutic activity, [id number]- Neuromuscular re-education, 97535- Self Care, 91478- Manual therapy, [id number]- Gait training, 97014- Electrical stimulation (unattended), 97016- Vasopneumatic device, Patient/Family education, Balance training, Stair training, Taping, Dry Needling, Joint mobilization, Cryotherapy, and Moist heat  PLAN FOR NEXT SESSION: Proceed per protocol.     Britanny Marksberry,ANGIE, PTA 04/08/2023, 8:22 AM  [facility] at [facility] [address] [place], [place], [address] Phone: [phone]   Fax:  [phone]  Patient Details  Name: Timothy [name] MRN: [id number] Date of Birth: [birth year] Referring  Provider:  [name], [name], MD  Encounter Date:  04/08/2023   Suanne Marker, PTA 04/08/2023, 8:22 AM  [facility] at [facility] [address] [place], [place], [address] Phone: [phone]   Fax:  [phone]

## 2023-04-10 ENCOUNTER — Other Ambulatory Visit (HOSPITAL_BASED_OUTPATIENT_CLINIC_OR_DEPARTMENT_OTHER): Payer: Self-pay

## 2023-04-10 ENCOUNTER — Ambulatory Visit (HOSPITAL_BASED_OUTPATIENT_CLINIC_OR_DEPARTMENT_OTHER): Payer: 59 | Admitting: Orthopaedic Surgery

## 2023-04-10 DIAGNOSIS — S86811A Strain of other muscle(s) and tendon(s) at lower leg level, right leg, initial encounter: Secondary | ICD-10-CM

## 2023-04-10 MED ORDER — OXYCODONE-ACETAMINOPHEN 5-325 MG PO TABS
1.0000 | ORAL_TABLET | ORAL | 0 refills | Status: AC | PRN
  Filled 2023-04-10: qty 10, 2d supply, fill #0

## 2023-04-10 NOTE — Progress Notes (Signed)
 Post Operative Evaluation    Procedure/Date of Surgery: Right patella tendon reconstruction 1/27  Interval History:    6 weeks status post right patella tendon reconstruction overall doing extremely well.  Overall he is continue to improve.  He is working with physical therapy as he is still getting some mild swelling the brace.  He is having pain with physical therapy and is requiring a narcotic for just this   PMH/PSH/Family History/Social History/Meds/Allergies:    Past Medical History:  Diagnosis Date   Allergies    Anemia    Blind left eye    CHF (congestive heart failure) (HCC)    Chronic kidney disease    ESRD - HD M,W,F   Diabetes mellitus without complication (HCC)    Type2   Dyspnea    Patient denies SOB since he started dialysis. States he went away after he started dialysis   HLD (hyperlipidemia)    Hypertension    Pneumonia    x2   Past Surgical History:  Procedure Laterality Date   A/V FISTULAGRAM Left 11/19/2022   Procedure: A/V Fistulagram;  Surgeon: Nada Libman, MD;  [facility];  Service: Cardiovascular;  Laterality: Left;   AV FISTULA PLACEMENT Left 09/19/2022   Procedure: ARTERIOVENOUS (AV) FISTULA CREATION;  Surgeon: Nada Libman, MD;  Location: [facility] OR;  Service: Vascular;  Laterality: Left;   BIOPSY  12/05/2022   Procedure: BIOPSY;  Surgeon: Napoleon Form, MD;  [facility];  Service: Gastroenterology;;   CATARACT EXTRACTION Right    COLONOSCOPY WITH PROPOFOL N/A 12/05/2022   Procedure: COLONOSCOPY WITH PROPOFOL;  Surgeon: Napoleon Form, MD;  Location: [facility];  Service: Gastroenterology;  Laterality: N/A;   ESOPHAGOGASTRODUODENOSCOPY (EGD) WITH PROPOFOL N/A 12/05/2022   Procedure: ESOPHAGOGASTRODUODENOSCOPY (EGD) WITH PROPOFOL;  Surgeon: Napoleon Form, MD;  [facility];  Service: Gastroenterology;  Laterality: N/A;   FRACTURE SURGERY Left     [place]   KNEE RECONSTRUCTION Right 03/03/2023   Procedure: RIGHT KNEE QUADRICEPS TENDON LENGTHENING AND PATELLA TENDON RECONSTRUCTION WITH ACHILLES ALLOGRAFT;  Surgeon: Huel Cote, MD;  [facility];  Service: Orthopedics;  Laterality: Right;   LIGATION OF COMPETING BRANCHES OF ARTERIOVENOUS FISTULA Left 03/11/2023   Procedure: LIGATION OF COMPETING BRANCHES OF LEFT ARTERIOVENOUS FISTULA;  Surgeon: Daria Pastures, MD;  Location: [facility] OR;  Service: Vascular;  Laterality: Left;   MENISCUS REPAIR Left    PERIPHERAL VASCULAR INTERVENTION  11/19/2022   Procedure: PERIPHERAL VASCULAR INTERVENTION;  Surgeon: Nada Libman, MD;  [facility];  Service: Cardiovascular;;  Embolization coil   POLYPECTOMY  12/05/2022   Procedure: POLYPECTOMY;  Surgeon: Napoleon Form, MD;  [facility];  Service: Gastroenterology;;   Social History   Socioeconomic History   Marital status: Single    Spouse name: Not on file   Number of children: 1   Years of education: Not on file   Highest education level: Not on file  Occupational History   Occupation: disabled  Tobacco Use   Smoking status: Never    Passive exposure: Never   Smokeless tobacco: Never  Vaping Use   Vaping status: Never Used  Substance and Sexual Activity   Alcohol use: Not Currently   Drug use: Not Currently   Sexual activity: Yes  Other  Topics Concern   Not on file  Social History Narrative   Not on file   Social Drivers of Health   Financial Resource Strain: Low Risk  (12/19/2022)   Overall Financial Resource Strain (CARDIA)    Difficulty of Paying Living Expenses: Not hard at all  Food Insecurity: No Food Insecurity (12/19/2022)   Hunger Vital Sign    Worried About Running Out of Food in the Last Year: Never true    Ran Out of Food in the Last Year: Never true  Transportation Needs: No Transportation Needs (12/19/2022)   PRAPARE - Administrator, Civil Service (Medical): No     Lack of Transportation (Non-Medical): No  Physical Activity: Insufficiently Active (12/19/2022)   Exercise Vital Sign    Days of Exercise per Week: 2 days    Minutes of Exercise per Session: 60 min  Stress: No Stress Concern Present (12/19/2022)   [employer] of Occupational Health - Occupational Stress Questionnaire    Feeling of Stress : Not at all  Social Connections: Moderately Integrated (12/19/2022)   Social Connection and Isolation Panel [NHANES]    Frequency of Communication with Friends and Family: More than three times a week    Frequency of Social Gatherings with Friends and Family: Once a week    Attends Religious Services: More than 4 times per year    Active Member of [employer] or Organizations: No    Attends Engineer, structural: Never    Marital Status: Living with partner   Family History  Problem Relation Age of Onset   Hypertension Mother    Diabetes Mother    Heart attack Mother    Diabetes Brother    No Known Allergies Current Outpatient Medications  Medication Sig Dispense Refill   amLODipine (NORVASC) 10 MG tablet Take 10 mg by mouth daily.     atorvastatin (LIPITOR) 40 MG tablet Take 1 tablet (40 mg total) by mouth daily. 90 tablet 1   Continuous Glucose Sensor (DEXCOM G7 SENSOR) MISC 1 Device by Does not apply route continuous. 9 each 1   Continuous Glucose Sensor (FREESTYLE LIBRE 3 SENSOR) MISC 1 each by Does not apply route See admin instructions. Replace every 14 days 6 each 1   Empagliflozin-linaGLIPtin 25-5 MG TABS Take 1 tablet by mouth daily. 90 tablet 1   furosemide (LASIX) 80 MG tablet Take 80 mg by mouth every Tuesday, Thursday, Saturday, and Sunday.     hydrALAZINE (APRESOLINE) 100 MG tablet Take 50 mg by mouth 3 (three) times daily. MAY TAKE EXTRA 50MG  FOR SBP>160     insulin glargine (LANTUS SOLOSTAR) 100 UNIT/ML Solostar Pen INJECT 5 UINTS INTO THE SKIN DAILY (Patient taking differently: Inject 8 Units into the skin at bedtime.) 15  mL 1   isosorbide mononitrate (IMDUR) 60 MG 24 hr tablet Take 60 mg by mouth daily.     labetalol (NORMODYNE) 200 MG tablet Take 400 mg by mouth 2 (two) times daily.     oxyCODONE-acetaminophen (PERCOCET/ROXICET) 5-325 MG tablet Take 1 tablet by mouth every 6 (six) hours as needed. 12 tablet 0   pantoprazole (PROTONIX) 40 MG tablet Take 1 tablet (40 mg total) by mouth 2 (two) times daily before a meal. 180 tablet 3   torsemide (DEMADEX) 20 MG tablet Take 20 mg by mouth 2 (two) times daily.     No current facility-administered medications for this visit.   No results found.  Review of Systems:   A ROS was  performed including pertinent positives and negatives as documented in the HPI.   Musculoskeletal Exam:    There were no vitals taken for this visit.  Right knee with active full extension with good quadriceps strength but decreased bulk.  Flexion deferred today.  Distal neurosensory exam is intact  Imaging:      I personally reviewed and interpreted the radiographs.   Assessment:   6 weeks status post right patella tendon reconstruction overall doing extremely well.  At this time we will continue to work on strengthening and utilize brace usage as needed to prevent buckling of his quad.  I will plan to see him back in 6 weeks for reassessment.  1 additional oxycodone prescription refilled.  I have counseled that this will be the last refill  Plan :    -Return to clinic 6 weeks for reassessment      I personally saw and evaluated the patient, and participated in the management and treatment plan.  Huel Cote, MD Attending Physician, Orthopedic Surgery  This document was dictated using Dragon voice recognition software. A reasonable attempt at proof reading has been made to minimize errors.

## 2023-04-11 DIAGNOSIS — N2581 Secondary hyperparathyroidism of renal origin: Secondary | ICD-10-CM | POA: Diagnosis not present

## 2023-04-11 DIAGNOSIS — D689 Coagulation defect, unspecified: Secondary | ICD-10-CM | POA: Diagnosis not present

## 2023-04-11 DIAGNOSIS — R52 Pain, unspecified: Secondary | ICD-10-CM | POA: Diagnosis not present

## 2023-04-11 DIAGNOSIS — Z992 Dependence on renal dialysis: Secondary | ICD-10-CM | POA: Diagnosis not present

## 2023-04-11 DIAGNOSIS — N186 End stage renal disease: Secondary | ICD-10-CM | POA: Diagnosis not present

## 2023-04-11 DIAGNOSIS — D631 Anemia in chronic kidney disease: Secondary | ICD-10-CM | POA: Diagnosis not present

## 2023-04-14 ENCOUNTER — Ambulatory Visit: Payer: 59 | Admitting: "Endocrinology

## 2023-04-14 DIAGNOSIS — D631 Anemia in chronic kidney disease: Secondary | ICD-10-CM | POA: Diagnosis not present

## 2023-04-14 DIAGNOSIS — R52 Pain, unspecified: Secondary | ICD-10-CM | POA: Diagnosis not present

## 2023-04-14 DIAGNOSIS — D689 Coagulation defect, unspecified: Secondary | ICD-10-CM | POA: Diagnosis not present

## 2023-04-14 DIAGNOSIS — Z992 Dependence on renal dialysis: Secondary | ICD-10-CM | POA: Diagnosis not present

## 2023-04-14 DIAGNOSIS — N2581 Secondary hyperparathyroidism of renal origin: Secondary | ICD-10-CM | POA: Diagnosis not present

## 2023-04-14 DIAGNOSIS — N186 End stage renal disease: Secondary | ICD-10-CM | POA: Diagnosis not present

## 2023-04-15 ENCOUNTER — Encounter: Payer: Self-pay | Admitting: Physical Therapy

## 2023-04-15 ENCOUNTER — Ambulatory Visit: Payer: 59 | Admitting: Physical Therapy

## 2023-04-15 DIAGNOSIS — R2681 Unsteadiness on feet: Secondary | ICD-10-CM

## 2023-04-15 DIAGNOSIS — M6281 Muscle weakness (generalized): Secondary | ICD-10-CM | POA: Diagnosis not present

## 2023-04-15 DIAGNOSIS — M25661 Stiffness of right knee, not elsewhere classified: Secondary | ICD-10-CM | POA: Diagnosis not present

## 2023-04-15 DIAGNOSIS — R600 Localized edema: Secondary | ICD-10-CM | POA: Diagnosis not present

## 2023-04-15 NOTE — Therapy (Signed)
 OUTPATIENT PHYSICAL THERAPY LOWER EXTREMITY    Patient Name: Timothy [name] MRN: [id number] DOB:04-08-1970, 53 y.o., male Today's Date: 04/15/2023  END OF SESSION:  PT End of Session - 04/15/23 0843     Visit Number 8    Date for PT Re-Evaluation 05/29/23    PT Start Time 0845    PT Stop Time 0930    PT Time Calculation (min) 45 min    Activity Tolerance Patient tolerated treatment well    Behavior During Therapy WFL for tasks assessed/performed             Past Medical History:  Diagnosis Date   Allergies    Anemia    Blind left eye    CHF (congestive heart failure) (HCC)    Chronic kidney disease    ESRD - HD M,W,F   Diabetes mellitus without complication (HCC)    Type2   Dyspnea    Patient denies SOB since he started dialysis. States he went away after he started dialysis   HLD (hyperlipidemia)    Hypertension    Pneumonia    x2   Past Surgical History:  Procedure Laterality Date   A/V FISTULAGRAM Left 11/19/2022   Procedure: A/V Fistulagram;  Surgeon: Nada Libman, MD;  Location: [facility];  Service: Cardiovascular;  Laterality: Left;   AV FISTULA PLACEMENT Left 09/19/2022   Procedure: ARTERIOVENOUS (AV) FISTULA CREATION;  Surgeon: Nada Libman, MD;  Location: [facility] OR;  Service: Vascular;  Laterality: Left;   BIOPSY  12/05/2022   Procedure: BIOPSY;  Surgeon: Napoleon Form, MD;  Location: [facility];  Service: Gastroenterology;;   CATARACT EXTRACTION Right    COLONOSCOPY WITH PROPOFOL N/A 12/05/2022   Procedure: COLONOSCOPY WITH PROPOFOL;  Surgeon: Napoleon Form, MD;  Location: [facility];  Service: Gastroenterology;  Laterality: N/A;   ESOPHAGOGASTRODUODENOSCOPY (EGD) WITH PROPOFOL N/A 12/05/2022   Procedure: ESOPHAGOGASTRODUODENOSCOPY (EGD) WITH PROPOFOL;  Surgeon: Napoleon Form, MD;  Location: [facility];  Service: Gastroenterology;  Laterality: N/A;   FRACTURE SURGERY Left    [place]   KNEE RECONSTRUCTION Right  03/03/2023   Procedure: RIGHT KNEE QUADRICEPS TENDON LENGTHENING AND PATELLA TENDON RECONSTRUCTION WITH ACHILLES ALLOGRAFT;  Surgeon: Huel Cote, MD;  Location: [facility] OR;  Service: Orthopedics;  Laterality: Right;   LIGATION OF COMPETING BRANCHES OF ARTERIOVENOUS FISTULA Left 03/11/2023   Procedure: LIGATION OF COMPETING BRANCHES OF LEFT ARTERIOVENOUS FISTULA;  Surgeon: Daria Pastures, MD;  Location: [facility] OR;  Service: Vascular;  Laterality: Left;   MENISCUS REPAIR Left    PERIPHERAL VASCULAR INTERVENTION  11/19/2022   Procedure: PERIPHERAL VASCULAR INTERVENTION;  Surgeon: Nada Libman, MD;  Location: [facility];  Service: Cardiovascular;;  Embolization coil   POLYPECTOMY  12/05/2022   Procedure: POLYPECTOMY;  Surgeon: Napoleon Form, MD;  Location: [facility];  Service: Gastroenterology;;   Patient Active Problem List   Diagnosis Date Noted   Rupture of right patellar tendon 03/03/2023   Other disorders of phosphorus metabolism 01/04/2023   Heme positive stool 12/05/2022   Generalized abdominal discomfort 12/05/2022   Polyp of ascending colon 12/05/2022   Gastritis and gastroduodenitis 12/05/2022   Gastric polyp 12/05/2022   Foot callus 12/03/2022   Iron deficiency anemia, unspecified 11/01/2022   Angiodysplasia of stomach and duodenum with bleeding 09/06/2022   Other iron deficiency anemias 08/30/2022   Allergy, unspecified, initial encounter 08/20/2022   Anaphylactic reaction due to adverse effect of correct drug or medicament properly administered, initial  encounter 08/20/2022   Anaphylactic shock, unspecified, initial encounter 08/20/2022   Anemia in chronic kidney disease 08/20/2022   Coagulation defect, unspecified (HCC) 08/20/2022   Diarrhea, unspecified 08/20/2022   Encounter for immunization 08/20/2022   Encounter for screening for respiratory tuberculosis 08/20/2022   End stage renal disease (HCC) 08/20/2022   Fever, unspecified 08/20/2022   Nausea  08/20/2022   Pain, unspecified 08/20/2022   Pruritus, unspecified 08/20/2022   Dependence on renal dialysis (HCC) 08/16/2022   Hyperlipidemia, unspecified 08/16/2022   Long term (current) use of insulin (HCC) 08/16/2022   Secondary hyperparathyroidism of renal origin (HCC) 08/16/2022   Vitamin D deficiency, unspecified 08/16/2022   Hematemesis 08/05/2022   Erectile dysfunction 01/18/2021   Pneumonia 11/15/2020   Hemoptysis 11/15/2020   CKD (chronic kidney disease), stage IV (HCC) 11/15/2020   Positive D dimer 11/15/2020   Knee injury, right, subsequent encounter 09/07/2020   Type 2 diabetes mellitus (HCC) 08/22/2020   Acute respiratory failure with hypoxia (HCC) 06/22/2020   Multifocal pneumonia 06/22/2020   Accelerated hypertension 06/20/2019   Elevated troponin 06/19/2019   Chronic diastolic CHF (congestive heart failure) (HCC) 06/19/2019   Hypoalbuminemia 06/19/2019   Hypertensive urgency 06/19/2019   AKI (acute kidney injury) (HCC) 06/19/2019   CHF (congestive heart failure) (HCC) 06/19/2019    PCP: [name], [name], MD  REFERRING PROVIDER: Huel Cote, MD   REFERRING DIAG: [id number] (ICD-10-CM) - Rupture of right patellar tendon, initial encounter   THERAPY DIAG:  Stiffness of right knee, not elsewhere classified  Muscle weakness (generalized)  Localized edema  Unsteadiness on feet  Rationale for Evaluation and Treatment: Rehabilitation  ONSET DATE: 02/19/23, 03/03/23-right knee quadriceps tendon lengthening and patella tendon reconstruction with Achilles allograft   SUBJECTIVE:   SUBJECTIVE STATEMENT: Seen the doctor Friday, "He said I could walk without the brace"  PERTINENT HISTORY: Per referring physician note: PMHx: anemia, Blind L eye, CHF, CKD, stage 4, DM- on Dialysis, HLD, HTN, L patellar tendon repair about 20 years ago. Right knee with a proximately retracted patella. He is not able to actively extend against gravity. Range of motion of the  right knee is from approximately 20 degrees to 40 degrees. There is tenderness about the remaining trochlear groove.  53 y.o. 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  MRI does not show significant osteoarthritis and given that I do believe he would benefit from a VY lengthening of the quadriceps with the patella tendon reconstruction with allograft Achilles.   PAIN:  Are you having pain? Yes: NPRS scale: 0/10 Pain location: R knee Pain description: ache Aggravating factors: Worst at night Relieving factors: Ice  PRECAUTIONS: Knee,   RED FLAGS: None   WEIGHT BEARING RESTRICTIONS: Yes WBAT with brace and crutches  FALLS:  Has patient fallen in last 6 months? No  LIVING ENVIRONMENT: Lives with: lives with their family Lives in: House/apartment Stairs: No Has following equipment at home: None  OCCUPATION: On disability  PLOF: Independent  PATIENT GOALS: Patient would like to recover his ability to walk around normally.  NEXT MD VISIT: about 2 weeks  OBJECTIVE:  Note: Objective measures were completed at Evaluation unless otherwise noted.  DIAGNOSTIC FINDINGS:  03/03/23 Xray (4 views right knee): Evidence of an old patella tendon avulsion with significant retraction and osteoarthritis of the patella   MRI right knee: There is full-thickness chronic patella tendon rupture without significant patellofemoral cartilage loss  COGNITION: Overall cognitive status: Within functional limits  for tasks assessed     SENSATION: WFL  EDEMA:  None observed  MUSCLE LENGTH: Hamstrings: WNL  POSTURE: Stands with weight shifted to LLE  PALPATION: Patient's dressings prevent full observation of knee, gentle patellar mobs performed, no pain  LOWER EXTREMITY ROM: LLE WNL  Passive ROM Right eval Left eval  Hip flexion    Hip extension    Hip abduction    Hip adduction    Hip internal rotation    Hip  external rotation    Knee flexion 45   Knee extension 0   Ankle dorsiflexion 0   Ankle plantarflexion 52   Ankle inversion    Ankle eversion     (Blank rows = not tested)  LOWER EXTREMITY MMT: LLE 5/5, R hip at least 4/5, knee- active quad set- weak, ankle WNL  LOWER EXTREMITY SPECIAL TESTS:  N/T  FUNCTIONAL TESTS:  N/T  GAIT: Distance walked: In clinic distances Assistive device utilized: None Level of assistance: Modified independence Comments: Patient reports he did not receive crutches after surgery. Later reports that his girlfriend has crutches he can adjust. He had this same surgery on LLE about 20 years ago, reports he can safely use crutches, declined to practice in the clinic.                                                                                                                              TREATMENT DATE:  04/15/23 NuStep L4 x 6 min 20lg resisted gait 4 way x 3 each Sit to stand form elevated surface  RLE Multi angle extension isometrics  RLE HS curls green 2x10 SLR RLE 2x10  W/ ER x10 Side lying hip add 2x10 RLE Prone hip Ext RLE   04/08/23 AROM sitting EOB SOC 65, after PROM per protocol AROM 85, 90 pass as per protocol Seated HS red tband 2 sets 10 RLE SLR, SLR w/ ER, Hip Add, Hip abd, Hip Ext x 10 with 2# and cued to activate and engage quad 1st. 2nd set no wt- quad lag with SLRs Alt box taps 4in 20 x 2 sets - PTA supporting RT knee Standing toe and heel raises on black bar 2x10 Resisted gait 4 ways 5 x each with brace on 30#- brace on Gait 329ft focusing on heel strike, hip and knee flex  04/03/23 R knee patellar mobs  RLE SLR, SLR w/ ER, Hip Add, Hip abd, Hip Ext x 10 for two rounds  R ankle 4 way x 10 green  R knee PROM with end range mobs within protocol Standing toe and heel raises on black bar 2x10 Alt box taps 4in Gait 341ft focusing on heel strike, hip and knee flex  03/27/23 R knee patellar mobs  RLE SLR, SLR w/ ER, Hip Add, Hip abd,  Hip Ext x 10 for two rounds  R ankle 4 way x 10 green  R knee PROM with end range mobs within protocol  03/20/23 Black bar heel and toe raises 2 sets 10 with brace on Resisted gait 4 ways 5 x each with brace on 30#- brace on Standing hip cable pulleys 4 way 5# 10 x BIL- brace on Feet on ball small bridge 2 sets 10 Feet on ball small HS curl < 60 degrees 2 sets 10, the isometric HS  Estim RUSSIAN 10 min quad set 5sec on/5 sec off STW to RT knee, quad and incision    03/18/23 Black bar heel and toe raises 2 sets 10 with brace on Resisted gait 4 ways 5 x each with brace on 20 # SLR 4 way lying 2 sets 10 Feet on ball small bridge 2 sets 10 Feet on ball small HS curl < 60 degrees 2 sets 10, the isometric HS  STW to RT knee, quad and incision ( okayed to massage scar at home ,instructed and he VU) tightness ITB and lateral quad  03/11/23 Quad set hold 3 sec 10x Quad set with min a SLR 2 sets 10 HS slide  to 30 degrees 10 x 2 sets HS press down 10x hold 3 sec Patellar mobs with good mobility and non tender Assisted supine hip ABD and ADD 2 sets 10 Ankle DF,PF,INV,Ev red tband 2 sets 10   03/06/23 Education Quad sets, ankle pumps, HS to 45 degrees, SLR with max A Gentle patellar mobs  PATIENT EDUCATION:  Education details: POC, HEP Person educated: Patient Education method: Explanation Education comprehension: verbalized understanding and returned demonstration  HOME EXERCISE PROGRAM: [email or web address]  ASSESSMENT:  CLINICAL IMPRESSION:  Progressed ROM and ex within protocol - he is 6 weeks so still limited. He ambulated in without brace stating MD told him he did not have to wear it anymore.  pt needed cuing to prevent compensations wit elevated sit to stands. He does have some extensor lag with SLR. Unable to obtain a forceful contraction with quad sets. Increase weakness with multi angle isometrics ad R knee gets closer to full extension  OBJECTIVE IMPAIRMENTS: Abnormal gait,  decreased activity tolerance, decreased balance, decreased coordination, difficulty walking, decreased ROM, decreased strength, impaired flexibility, improper body mechanics, postural dysfunction, and pain.   ACTIVITY LIMITATIONS: carrying, lifting, bending, sitting, standing, squatting, sleeping, stairs, transfers, bathing, toileting, dressing, and locomotion level  PARTICIPATION LIMITATIONS: meal prep, cleaning, laundry, driving, shopping, and community activity  PERSONAL FACTORS: Past/current experiences are also affecting patient's functional outcome.   REHAB POTENTIAL: Good  CLINICAL DECISION MAKING: Evolving/moderate complexity  EVALUATION COMPLEXITY: Low   GOALS: Goals reviewed with patient? Yes  SHORT TERM GOALS: Target date: 03/21/23 I with initial HEP Baseline: Goal status: 03/12/23 MET  LONG TERM GOALS: Target date: 05/29/23  I with final HEP Baseline:  Goal status: INITIAL  2.  Patient will recover R knee AROM to at least 0-120 degrees Baseline: 0-45 Goal status: 04/08/23 progressing  3.  Patient will recover R knee strength to 5/5 Baseline: Active, but weak quad contraction achieved. Goal status: INITIAL  4.  Patient will perform dynamic SLS activities on RLE on unstable surface with no LOB Baseline:  Goal status: INITIAL  5.  Patient will be able to walk x at least 1000' on all surfaces, including up and down steps, I, no gait abnormalities Baseline:  Goal status: 04/08/23 progressing  6.  Patient will be able to perform dynamic, power moves such as jumping, hopping, agility ladder drills with no pain or instability in R knee. Baseline:  Goal status: INITIAL   PLAN:  PT FREQUENCY: 1-2x/week  PT DURATION: 12 weeks  PLANNED INTERVENTIONS: 97110-Therapeutic exercises, 97530- Therapeutic activity, [id number]- Neuromuscular re-education, 97535- Self Care, 16109- Manual therapy, [id number]- Gait training, 97014- Electrical stimulation (unattended), 97016- Vasopneumatic  device, Patient/Family education, Balance training, Stair training, Taping, Dry Needling, Joint mobilization, Cryotherapy, and Moist heat  PLAN FOR NEXT SESSION: Proceed per protocol.     Grayce Sessions, PTA 04/15/2023, 8:43 AM

## 2023-04-16 DIAGNOSIS — N186 End stage renal disease: Secondary | ICD-10-CM | POA: Diagnosis not present

## 2023-04-16 DIAGNOSIS — N2581 Secondary hyperparathyroidism of renal origin: Secondary | ICD-10-CM | POA: Diagnosis not present

## 2023-04-16 DIAGNOSIS — D631 Anemia in chronic kidney disease: Secondary | ICD-10-CM | POA: Diagnosis not present

## 2023-04-16 DIAGNOSIS — D689 Coagulation defect, unspecified: Secondary | ICD-10-CM | POA: Diagnosis not present

## 2023-04-16 DIAGNOSIS — Z992 Dependence on renal dialysis: Secondary | ICD-10-CM | POA: Diagnosis not present

## 2023-04-16 DIAGNOSIS — R52 Pain, unspecified: Secondary | ICD-10-CM | POA: Diagnosis not present

## 2023-04-17 ENCOUNTER — Encounter: Payer: Self-pay | Admitting: Physical Therapy

## 2023-04-17 ENCOUNTER — Ambulatory Visit: Payer: 59 | Admitting: Physical Therapy

## 2023-04-17 DIAGNOSIS — M25661 Stiffness of right knee, not elsewhere classified: Secondary | ICD-10-CM

## 2023-04-17 DIAGNOSIS — R600 Localized edema: Secondary | ICD-10-CM

## 2023-04-17 DIAGNOSIS — M6281 Muscle weakness (generalized): Secondary | ICD-10-CM | POA: Diagnosis not present

## 2023-04-17 DIAGNOSIS — R2681 Unsteadiness on feet: Secondary | ICD-10-CM

## 2023-04-17 NOTE — Therapy (Signed)
 OUTPATIENT PHYSICAL THERAPY LOWER EXTREMITY    Patient Name: Timothy [name] MRN: [id number] DOB:05-09-1970, 53 y.o., male Today's Date: 04/17/2023  END OF SESSION:  PT End of Session - 04/17/23 0846     Visit Number 9    Date for PT Re-Evaluation 05/29/23    PT Start Time 0843    PT Stop Time 0928    PT Time Calculation (min) 45 min    Activity Tolerance Patient tolerated treatment well    Behavior During Therapy [facility] for tasks assessed/performed             Past Medical History:  Diagnosis Date   Allergies    Anemia    Blind left eye    CHF (congestive heart failure) (HCC)    Chronic kidney disease    ESRD - HD M,W,F   Diabetes mellitus without complication (HCC)    Type2   Dyspnea    Patient denies SOB since he started dialysis. States he went away after he started dialysis   HLD (hyperlipidemia)    Hypertension    Pneumonia    x2   Past Surgical History:  Procedure Laterality Date   A/V FISTULAGRAM Left 11/19/2022   Procedure: A/V Fistulagram;  Surgeon: Nada Libman, MD;  Location: [facility];  Service: Cardiovascular;  Laterality: Left;   AV FISTULA PLACEMENT Left 09/19/2022   Procedure: ARTERIOVENOUS (AV) FISTULA CREATION;  Surgeon: Nada Libman, MD;  Location: [facility] OR;  Service: Vascular;  Laterality: Left;   BIOPSY  12/05/2022   Procedure: BIOPSY;  Surgeon: Napoleon Form, MD;  Location: [facility];  Service: Gastroenterology;;   CATARACT EXTRACTION Right    COLONOSCOPY WITH PROPOFOL N/A 12/05/2022   Procedure: COLONOSCOPY WITH PROPOFOL;  Surgeon: Napoleon Form, MD;  Location: [facility];  Service: Gastroenterology;  Laterality: N/A;   ESOPHAGOGASTRODUODENOSCOPY (EGD) WITH PROPOFOL N/A 12/05/2022   Procedure: ESOPHAGOGASTRODUODENOSCOPY (EGD) WITH PROPOFOL;  Surgeon: Napoleon Form, MD;  Location: [facility];  Service: Gastroenterology;  Laterality: N/A;   FRACTURE SURGERY Left    [place]   KNEE RECONSTRUCTION Right  03/03/2023   Procedure: RIGHT KNEE QUADRICEPS TENDON LENGTHENING AND PATELLA TENDON RECONSTRUCTION WITH ACHILLES ALLOGRAFT;  Surgeon: Huel Cote, MD;  Location: [facility] OR;  Service: Orthopedics;  Laterality: Right;   LIGATION OF COMPETING BRANCHES OF ARTERIOVENOUS FISTULA Left 03/11/2023   Procedure: LIGATION OF COMPETING BRANCHES OF LEFT ARTERIOVENOUS FISTULA;  Surgeon: Daria Pastures, MD;  Location: [facility] OR;  Service: Vascular;  Laterality: Left;   MENISCUS REPAIR Left    PERIPHERAL VASCULAR INTERVENTION  11/19/2022   Procedure: PERIPHERAL VASCULAR INTERVENTION;  Surgeon: Nada Libman, MD;  Location: [facility];  Service: Cardiovascular;;  Embolization coil   POLYPECTOMY  12/05/2022   Procedure: POLYPECTOMY;  Surgeon: Napoleon Form, MD;  Location: [facility];  Service: Gastroenterology;;   Patient Active Problem List   Diagnosis Date Noted   Rupture of right patellar tendon 03/03/2023   Other disorders of phosphorus metabolism 01/04/2023   Heme positive stool 12/05/2022   Generalized abdominal discomfort 12/05/2022   Polyp of ascending colon 12/05/2022   Gastritis and gastroduodenitis 12/05/2022   Gastric polyp 12/05/2022   Foot callus 12/03/2022   Iron deficiency anemia, unspecified 11/01/2022   Angiodysplasia of stomach and duodenum with bleeding 09/06/2022   Other iron deficiency anemias 08/30/2022   Allergy, unspecified, initial encounter 08/20/2022   Anaphylactic reaction due to adverse effect of correct drug or medicament properly administered, initial  encounter 08/20/2022   Anaphylactic shock, unspecified, initial encounter 08/20/2022   Anemia in chronic kidney disease 08/20/2022   Coagulation defect, unspecified (HCC) 08/20/2022   Diarrhea, unspecified 08/20/2022   Encounter for immunization 08/20/2022   Encounter for screening for respiratory tuberculosis 08/20/2022   End stage renal disease (HCC) 08/20/2022   Fever, unspecified 08/20/2022   Nausea  08/20/2022   Pain, unspecified 08/20/2022   Pruritus, unspecified 08/20/2022   Dependence on renal dialysis (HCC) 08/16/2022   Hyperlipidemia, unspecified 08/16/2022   Long term (current) use of insulin (HCC) 08/16/2022   Secondary hyperparathyroidism of renal origin (HCC) 08/16/2022   Vitamin D deficiency, unspecified 08/16/2022   Hematemesis 08/05/2022   Erectile dysfunction 01/18/2021   Pneumonia 11/15/2020   Hemoptysis 11/15/2020   CKD (chronic kidney disease), stage IV (HCC) 11/15/2020   Positive D dimer 11/15/2020   Knee injury, right, subsequent encounter 09/07/2020   Type 2 diabetes mellitus (HCC) 08/22/2020   Acute respiratory failure with hypoxia (HCC) 06/22/2020   Multifocal pneumonia 06/22/2020   Accelerated hypertension 06/20/2019   Elevated troponin 06/19/2019   Chronic diastolic CHF (congestive heart failure) (HCC) 06/19/2019   Hypoalbuminemia 06/19/2019   Hypertensive urgency 06/19/2019   AKI (acute kidney injury) (HCC) 06/19/2019   CHF (congestive heart failure) (HCC) 06/19/2019    PCP: [name], [name], MD  REFERRING PROVIDER: Huel Cote, MD   REFERRING DIAG: [phone] (ICD-10-CM) - Rupture of right patellar tendon, initial encounter   THERAPY DIAG:  Stiffness of right knee, not elsewhere classified  Muscle weakness (generalized)  Localized edema  Unsteadiness on feet  Rationale for Evaluation and Treatment: Rehabilitation  ONSET DATE: 02/19/23, 03/03/23-right knee quadriceps tendon lengthening and patella tendon reconstruction with Achilles allograft   SUBJECTIVE:   SUBJECTIVE STATEMENT: Feel great, no issues after last session  PERTINENT HISTORY: Per referring physician note: PMHx: anemia, Blind L eye, CHF, CKD, stage 4, DM- on Dialysis, HLD, HTN, L patellar tendon repair about 20 years ago. Right knee with a proximately retracted patella. He is not able to actively extend against gravity. Range of motion of the right knee is from  approximately 20 degrees to 40 degrees. There is tenderness about the remaining trochlear groove.  53 y.o. 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  MRI does not show significant osteoarthritis and given that I do believe he would benefit from a VY lengthening of the quadriceps with the patella tendon reconstruction with allograft Achilles.   PAIN:  Are you having pain? Yes: NPRS scale: 0/10 Pain location: R knee Pain description: ache Aggravating factors: Worst at night Relieving factors: Ice  PRECAUTIONS: Knee,   RED FLAGS: None   WEIGHT BEARING RESTRICTIONS: Yes WBAT with brace and crutches  FALLS:  Has patient fallen in last 6 months? No  LIVING ENVIRONMENT: Lives with: lives with their family Lives in: House/apartment Stairs: No Has following equipment at home: None  OCCUPATION: On disability  PLOF: Independent  PATIENT GOALS: Patient would like to recover his ability to walk around normally.  NEXT MD VISIT: about 2 weeks  OBJECTIVE:  Note: Objective measures were completed at Evaluation unless otherwise noted.  DIAGNOSTIC FINDINGS:  03/03/23 Xray (4 views right knee): Evidence of an old patella tendon avulsion with significant retraction and osteoarthritis of the patella   MRI right knee: There is full-thickness chronic patella tendon rupture without significant patellofemoral cartilage loss  COGNITION: Overall cognitive status: Within functional limits for tasks assessed  SENSATION: WFL  EDEMA:  None observed  MUSCLE LENGTH: Hamstrings: WNL  POSTURE: Stands with weight shifted to LLE  PALPATION: Patient's dressings prevent full observation of knee, gentle patellar mobs performed, no pain  LOWER EXTREMITY ROM: LLE WNL  Passive ROM Right eval Left eval  Hip flexion    Hip extension    Hip abduction    Hip adduction    Hip internal rotation    Hip external rotation     Knee flexion 45   Knee extension 0   Ankle dorsiflexion 0   Ankle plantarflexion 52   Ankle inversion    Ankle eversion     (Blank rows = not tested)  LOWER EXTREMITY MMT: LLE 5/5, R hip at least 4/5, knee- active quad set- weak, ankle WNL  LOWER EXTREMITY SPECIAL TESTS:  N/T  FUNCTIONAL TESTS:  N/T  GAIT: Distance walked: In clinic distances Assistive device utilized: None Level of assistance: Modified independence Comments: Patient reports he did not receive crutches after surgery. Later reports that his girlfriend has crutches he can adjust. He had this same surgery on LLE about 20 years ago, reports he can safely use crutches, declined to practice in the clinic.                                                                                                                              TREATMENT DATE:  04/17/23 NuStep L4 x 6 min R knee PROM with end range mobs within protocol RLE quad sets x10 RLE SLR 2x10  W/ ER x10 Eccentric SAQ with half black foam roll 2x10 RLE Multi angle extension isometrics  Bike L 1 x 2 min Heel raises black bar 2x15 4in step  ups  RLE 2x5   04/15/23 NuStep L4 x 6 min 20lb resisted gait 4 way x 3 each Sit to stand form elevated surface  RLE Multi angle extension isometrics  RLE HS curls green 2x10 SLR RLE 2x10  W/ ER 2x10 Side lying hip add 2x10 RLE Prone hip Ext RLE   04/08/23 AROM sitting EOB SOC 65, after PROM per protocol AROM 85, 90 pass as per protocol Seated HS red tband 2 sets 10 RLE SLR, SLR w/ ER, Hip Add, Hip abd, Hip Ext x 10 with 2# and cued to activate and engage quad 1st. 2nd set no wt- quad lag with SLRs Alt box taps 4in 20 x 2 sets - PTA supporting RT knee Standing toe and heel raises on black bar 2x10 Resisted gait 4 ways 5 x each with brace on 30#- brace on Gait 369ft focusing on heel strike, hip and knee flex  04/03/23 R knee patellar mobs  RLE SLR, SLR w/ ER, Hip Add, Hip abd, Hip Ext x 10 for two rounds  R ankle  4 way x 10 green  R knee PROM with end range mobs within protocol Standing toe and heel raises on black bar 2x10 Alt box taps  4in Gait 385ft focusing on heel strike, hip and knee flex  03/27/23 R knee patellar mobs  RLE SLR, SLR w/ ER, Hip Add, Hip abd, Hip Ext x 10 for two rounds  R ankle 4 way x 10 green  R knee PROM with end range mobs within protocol   03/20/23 Black bar heel and toe raises 2 sets 10 with brace on Resisted gait 4 ways 5 x each with brace on 30#- brace on Standing hip cable pulleys 4 way 5# 10 x BIL- brace on Feet on ball small bridge 2 sets 10 Feet on ball small HS curl < 60 degrees 2 sets 10, the isometric HS  Estim RUSSIAN 10 min quad set 5sec on/5 sec off STW to RT knee, quad and incision    03/18/23 Black bar heel and toe raises 2 sets 10 with brace on Resisted gait 4 ways 5 x each with brace on 20 # SLR 4 way lying 2 sets 10 Feet on ball small bridge 2 sets 10 Feet on ball small HS curl < 60 degrees 2 sets 10, the isometric HS  STW to RT knee, quad and incision ( okayed to massage scar at home ,instructed and he VU) tightness ITB and lateral quad  03/11/23 Quad set hold 3 sec 10x Quad set with min a SLR 2 sets 10 HS slide  to 30 degrees 10 x 2 sets HS press down 10x hold 3 sec Patellar mobs with good mobility and non tender Assisted supine hip ABD and ADD 2 sets 10 Ankle DF,PF,INV,Ev red tband 2 sets 10   03/06/23 Education Quad sets, ankle pumps, HS to 45 degrees, SLR with max A Gentle patellar mobs  PATIENT EDUCATION:  Education details: POC, HEP Person educated: Patient Education method: Explanation Education comprehension: verbalized understanding and returned demonstration  HOME EXERCISE PROGRAM: [email or web address]  ASSESSMENT:  CLINICAL IMPRESSION:  Progressed ROM and ex within protocol - he is 6 weeks so still limited. He continues to have extensor lag with SLR. Unable to obtain a forceful contraction with quad set, patella does not lock in  place. Unable to maintain full Ext with eccentric SAQ . Increase weakness with multi angle isometrics as R knee gets closer to full extension  OBJECTIVE IMPAIRMENTS: Abnormal gait, decreased activity tolerance, decreased balance, decreased coordination, difficulty walking, decreased ROM, decreased strength, impaired flexibility, improper body mechanics, postural dysfunction, and pain.   ACTIVITY LIMITATIONS: carrying, lifting, bending, sitting, standing, squatting, sleeping, stairs, transfers, bathing, toileting, dressing, and locomotion level  PARTICIPATION LIMITATIONS: meal prep, cleaning, laundry, driving, shopping, and community activity  PERSONAL FACTORS: Past/current experiences are also affecting patient's functional outcome.   REHAB POTENTIAL: Good  CLINICAL DECISION MAKING: Evolving/moderate complexity  EVALUATION COMPLEXITY: Low   GOALS: Goals reviewed with patient? Yes  SHORT TERM GOALS: Target date: 03/21/23 I with initial HEP Baseline: Goal status: 03/12/23 MET  LONG TERM GOALS: Target date: 05/29/23  I with final HEP Baseline:  Goal status: INITIAL  2.  Patient will recover R knee AROM to at least 0-120 degrees Baseline: 0-45 Goal status: 04/08/23 progressing  3.  Patient will recover R knee strength to 5/5 Baseline: Active, but weak quad contraction achieved. Goal status: INITIAL  4.  Patient will perform dynamic SLS activities on RLE on unstable surface with no LOB Baseline:  Goal status: INITIAL  5.  Patient will be able to walk x at least 1000' on all surfaces, including up and down steps, I, no gait abnormalities Baseline:  Goal status: 04/08/23 progressing  6.  Patient will be able to perform dynamic, power moves such as jumping, hopping, agility ladder drills with no pain or instability in R knee. Baseline:  Goal status: INITIAL   PLAN:  PT FREQUENCY: 1-2x/week  PT DURATION: 12 weeks  PLANNED INTERVENTIONS: 97110-Therapeutic exercises, 97530-  Therapeutic activity, [id number]- Neuromuscular re-education, 97535- Self Care, [id number]- Manual therapy, [id number]- Gait training, 97014- Electrical stimulation (unattended), 97016- Vasopneumatic device, Patient/Family education, Balance training, Stair training, Taping, Dry Needling, Joint mobilization, Cryotherapy, and Moist heat  PLAN FOR NEXT SESSION: Proceed per protocol.     Grayce Sessions, PTA 04/17/2023, 8:47 AM

## 2023-04-21 DIAGNOSIS — R52 Pain, unspecified: Secondary | ICD-10-CM | POA: Diagnosis not present

## 2023-04-21 DIAGNOSIS — N186 End stage renal disease: Secondary | ICD-10-CM | POA: Diagnosis not present

## 2023-04-21 DIAGNOSIS — D631 Anemia in chronic kidney disease: Secondary | ICD-10-CM | POA: Diagnosis not present

## 2023-04-21 DIAGNOSIS — Z992 Dependence on renal dialysis: Secondary | ICD-10-CM | POA: Diagnosis not present

## 2023-04-21 DIAGNOSIS — D689 Coagulation defect, unspecified: Secondary | ICD-10-CM | POA: Diagnosis not present

## 2023-04-21 DIAGNOSIS — N2581 Secondary hyperparathyroidism of renal origin: Secondary | ICD-10-CM | POA: Diagnosis not present

## 2023-04-22 ENCOUNTER — Ambulatory Visit: Payer: 59 | Admitting: Physical Therapy

## 2023-04-22 DIAGNOSIS — M6281 Muscle weakness (generalized): Secondary | ICD-10-CM

## 2023-04-22 DIAGNOSIS — M25661 Stiffness of right knee, not elsewhere classified: Secondary | ICD-10-CM | POA: Diagnosis not present

## 2023-04-22 DIAGNOSIS — R2681 Unsteadiness on feet: Secondary | ICD-10-CM | POA: Diagnosis not present

## 2023-04-22 DIAGNOSIS — R600 Localized edema: Secondary | ICD-10-CM | POA: Diagnosis not present

## 2023-04-22 NOTE — Therapy (Signed)
 OUTPATIENT PHYSICAL THERAPY LOWER EXTREMITY  Progress Note Reporting Period 03/06/23 to 04/22/23  See note below for Objective Data and Assessment of Progress/Goals.      Patient Name: Timothy [name] MRN: [id number] DOB:30-Oct-1970, 53 y.o., male Today's Date: 04/22/2023  END OF SESSION:  PT End of Session - 04/22/23 0756     Visit Number 10    Date for PT Re-Evaluation 05/29/23    PT Start Time 0757    PT Stop Time 0838    PT Time Calculation (min) 41 min             Past Medical History:  Diagnosis Date   Allergies    Anemia    Blind left eye    CHF (congestive heart failure) (HCC)    Chronic kidney disease    ESRD - HD M,W,F   Diabetes mellitus without complication (HCC)    Type2   Dyspnea    Patient denies SOB since he started dialysis. States he went away after he started dialysis   HLD (hyperlipidemia)    Hypertension    Pneumonia    x2   Past Surgical History:  Procedure Laterality Date   A/V FISTULAGRAM Left 11/19/2022   Procedure: A/V Fistulagram;  Surgeon: Nada Libman, MD;  Location: [facility];  Service: Cardiovascular;  Laterality: Left;   AV FISTULA PLACEMENT Left 09/19/2022   Procedure: ARTERIOVENOUS (AV) FISTULA CREATION;  Surgeon: Nada Libman, MD;  Location: [facility] OR;  Service: Vascular;  Laterality: Left;   BIOPSY  12/05/2022   Procedure: BIOPSY;  Surgeon: Napoleon Form, MD;  Location: [facility];  Service: Gastroenterology;;   CATARACT EXTRACTION Right    COLONOSCOPY WITH PROPOFOL N/A 12/05/2022   Procedure: COLONOSCOPY WITH PROPOFOL;  Surgeon: Napoleon Form, MD;  Location: [facility];  Service: Gastroenterology;  Laterality: N/A;   ESOPHAGOGASTRODUODENOSCOPY (EGD) WITH PROPOFOL N/A 12/05/2022   Procedure: ESOPHAGOGASTRODUODENOSCOPY (EGD) WITH PROPOFOL;  Surgeon: Napoleon Form, MD;  Location: [facility];  Service: Gastroenterology;  Laterality: N/A;   FRACTURE SURGERY Left    [place]   KNEE  RECONSTRUCTION Right 03/03/2023   Procedure: RIGHT KNEE QUADRICEPS TENDON LENGTHENING AND PATELLA TENDON RECONSTRUCTION WITH ACHILLES ALLOGRAFT;  Surgeon: Huel Cote, MD;  Location: [facility] OR;  Service: Orthopedics;  Laterality: Right;   LIGATION OF COMPETING BRANCHES OF ARTERIOVENOUS FISTULA Left 03/11/2023   Procedure: LIGATION OF COMPETING BRANCHES OF LEFT ARTERIOVENOUS FISTULA;  Surgeon: Daria Pastures, MD;  Location: [facility] OR;  Service: Vascular;  Laterality: Left;   MENISCUS REPAIR Left    PERIPHERAL VASCULAR INTERVENTION  11/19/2022   Procedure: PERIPHERAL VASCULAR INTERVENTION;  Surgeon: Nada Libman, MD;  Location: [facility];  Service: Cardiovascular;;  Embolization coil   POLYPECTOMY  12/05/2022   Procedure: POLYPECTOMY;  Surgeon: Napoleon Form, MD;  Location: [facility];  Service: Gastroenterology;;   Patient Active Problem List   Diagnosis Date Noted   Rupture of right patellar tendon 03/03/2023   Other disorders of phosphorus metabolism 01/04/2023   Heme positive stool 12/05/2022   Generalized abdominal discomfort 12/05/2022   Polyp of ascending colon 12/05/2022   Gastritis and gastroduodenitis 12/05/2022   Gastric polyp 12/05/2022   Foot callus 12/03/2022   Iron deficiency anemia, unspecified 11/01/2022   Angiodysplasia of stomach and duodenum with bleeding 09/06/2022   Other iron deficiency anemias 08/30/2022   Allergy, unspecified, initial encounter 08/20/2022   Anaphylactic reaction due to adverse effect of correct drug or medicament properly  administered, initial encounter 08/20/2022   Anaphylactic shock, unspecified, initial encounter 08/20/2022   Anemia in chronic kidney disease 08/20/2022   Coagulation defect, unspecified (HCC) 08/20/2022   Diarrhea, unspecified 08/20/2022   Encounter for immunization 08/20/2022   Encounter for screening for respiratory tuberculosis 08/20/2022   End stage renal disease (HCC) 08/20/2022   Fever, unspecified  08/20/2022   Nausea 08/20/2022   Pain, unspecified 08/20/2022   Pruritus, unspecified 08/20/2022   Dependence on renal dialysis (HCC) 08/16/2022   Hyperlipidemia, unspecified 08/16/2022   Long term (current) use of insulin (HCC) 08/16/2022   Secondary hyperparathyroidism of renal origin (HCC) 08/16/2022   Vitamin D deficiency, unspecified 08/16/2022   Hematemesis 08/05/2022   Erectile dysfunction 01/18/2021   Pneumonia 11/15/2020   Hemoptysis 11/15/2020   CKD (chronic kidney disease), stage IV (HCC) 11/15/2020   Positive D dimer 11/15/2020   Knee injury, right, subsequent encounter 09/07/2020   Type 2 diabetes mellitus (HCC) 08/22/2020   Acute respiratory failure with hypoxia (HCC) 06/22/2020   Multifocal pneumonia 06/22/2020   Accelerated hypertension 06/20/2019   Elevated troponin 06/19/2019   Chronic diastolic CHF (congestive heart failure) (HCC) 06/19/2019   Hypoalbuminemia 06/19/2019   Hypertensive urgency 06/19/2019   AKI (acute kidney injury) (HCC) 06/19/2019   CHF (congestive heart failure) (HCC) 06/19/2019    PCP: [name], [name], MD  REFERRING PROVIDER: Huel Cote, MD   REFERRING DIAG: [phone] (ICD-10-CM) - Rupture of right patellar tendon, initial encounter   THERAPY DIAG:  Stiffness of right knee, not elsewhere classified  Muscle weakness (generalized)  Rationale for Evaluation and Treatment: Rehabilitation  ONSET DATE: 02/19/23, 03/03/23-right knee quadriceps tendon lengthening and patella tendon reconstruction with Achilles allograft   SUBJECTIVE:   SUBJECTIVE STATEMENT: Feel great, no issues after last session  PERTINENT HISTORY: Per referring physician note: PMHx: anemia, Blind L eye, CHF, CKD, stage 4, DM- on Dialysis, HLD, HTN, L patellar tendon repair about 20 years ago. Right knee with a proximately retracted patella. He is not able to actively extend against gravity. Range of motion of the right knee is from approximately 20 degrees to 40  degrees. There is tenderness about the remaining trochlear groove.  53 y.o. 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  MRI does not show significant osteoarthritis and given that I do believe he would benefit from a VY lengthening of the quadriceps with the patella tendon reconstruction with allograft Achilles.   PAIN:  Are you having pain? Yes: NPRS scale: 0/10 Pain location: R knee Pain description: ache Aggravating factors: Worst at night Relieving factors: Ice  PRECAUTIONS: Knee,   RED FLAGS: None   WEIGHT BEARING RESTRICTIONS: Yes WBAT with brace and crutches  FALLS:  Has patient fallen in last 6 months? No  LIVING ENVIRONMENT: Lives with: lives with their family Lives in: House/apartment Stairs: No Has following equipment at home: None  OCCUPATION: On disability  PLOF: Independent  PATIENT GOALS: Patient would like to recover his ability to walk around normally.  NEXT MD VISIT: about 2 weeks  OBJECTIVE:  Note: Objective measures were completed at Evaluation unless otherwise noted.  DIAGNOSTIC FINDINGS:  03/03/23 Xray (4 views right knee): Evidence of an old patella tendon avulsion with significant retraction and osteoarthritis of the patella   MRI right knee: There is full-thickness chronic patella tendon rupture without significant patellofemoral cartilage loss  COGNITION: Overall cognitive status: Within functional limits for tasks assessed     SENSATION: [facility]  EDEMA:  None observed  MUSCLE LENGTH: Hamstrings: WNL  POSTURE: Stands with weight shifted to LLE  PALPATION: Patient's dressings prevent full observation of knee, gentle patellar mobs performed, no pain  LOWER EXTREMITY ROM: LLE WNL  Passive ROM Right eval Left eval RT sitting 04/22/23 AROM   Hip flexion     Hip extension     Hip abduction     Hip adduction     Hip internal rotation     Hip external rotation      Knee flexion 45  105  Knee extension 0  12  Ankle dorsiflexion 0    Ankle plantarflexion 52    Ankle inversion     Ankle eversion      (Blank rows = not tested)  LOWER EXTREMITY MMT: LLE 5/5, R hip at least 4/5, knee- active quad set- weak, ankle WNL  LOWER EXTREMITY SPECIAL TESTS:  N/T  FUNCTIONAL TESTS:  N/T  GAIT: Distance walked: In clinic distances Assistive device utilized: None Level of assistance: Modified independence Comments: Patient reports he did not receive crutches after surgery. Later reports that his girlfriend has crutches he can adjust. He had this same surgery on LLE about 20 years ago, reports he can safely use crutches, declined to practice in the clinic.                                                                                                                              TREATMENT DATE:  04/22/23 Nustep L 5 Black Bar 20x DF and PF Leg Press ( limited ROM per protocol) 20# RT leg only 10 x 2 sets  SLS RT leg with vector swings left LE 10 x without UE - very difficult TM pull 20x  4 inch step ups RT leg working on correct tech and quad activation 2 sets 10 ,fwd and laterally  STS on airex 10x STS with Left leg on 4 inch step 10 x Resisted TKE 2 sets 10 Mini SL squats 10 x     04/17/23 NuStep L4 x 6 min R knee PROM with end range mobs within protocol RLE quad sets x10 RLE SLR 2x10  W/ ER x10 Eccentric SAQ with half black foam roll 2x10 RLE Multi angle extension isometrics  Bike L 1 x 2 min Heel raises black bar 2x15 4in step  ups  RLE 2x5   04/15/23 NuStep L4 x 6 min 20lb resisted gait 4 way x 3 each Sit to stand form elevated surface  RLE Multi angle extension isometrics  RLE HS curls green 2x10 SLR RLE 2x10  W/ ER 2x10 Side lying hip add 2x10 RLE Prone hip Ext RLE   04/08/23 AROM sitting EOB SOC 65, after PROM per protocol AROM 85, 90 pass as per protocol Seated HS red tband 2 sets 10 RLE SLR, SLR w/ ER, Hip Add, Hip abd,  Hip Ext x 10 with 2# and cued to activate and engage  quad 1st. 2nd set no wt- quad lag with SLRs Alt box taps 4in 20 x 2 sets - PTA supporting RT knee Standing toe and heel raises on black bar 2x10 Resisted gait 4 ways 5 x each with brace on 30#- brace on Gait 395ft focusing on heel strike, hip and knee flex  04/03/23 R knee patellar mobs  RLE SLR, SLR w/ ER, Hip Add, Hip abd, Hip Ext x 10 for two rounds  R ankle 4 way x 10 green  R knee PROM with end range mobs within protocol Standing toe and heel raises on black bar 2x10 Alt box taps 4in Gait 332ft focusing on heel strike, hip and knee flex  03/27/23 R knee patellar mobs  RLE SLR, SLR w/ ER, Hip Add, Hip abd, Hip Ext x 10 for two rounds  R ankle 4 way x 10 green  R knee PROM with end range mobs within protocol   03/20/23 Black bar heel and toe raises 2 sets 10 with brace on Resisted gait 4 ways 5 x each with brace on 30#- brace on Standing hip cable pulleys 4 way 5# 10 x BIL- brace on Feet on ball small bridge 2 sets 10 Feet on ball small HS curl < 60 degrees 2 sets 10, the isometric HS  Estim RUSSIAN 10 min quad set 5sec on/5 sec off STW to RT knee, quad and incision    03/18/23 Black bar heel and toe raises 2 sets 10 with brace on Resisted gait 4 ways 5 x each with brace on 20 # SLR 4 way lying 2 sets 10 Feet on ball small bridge 2 sets 10 Feet on ball small HS curl < 60 degrees 2 sets 10, the isometric HS  STW to RT knee, quad and incision ( okayed to massage scar at home ,instructed and he VU) tightness ITB and lateral quad  03/11/23 Quad set hold 3 sec 10x Quad set with min a SLR 2 sets 10 HS slide  to 30 degrees 10 x 2 sets HS press down 10x hold 3 sec Patellar mobs with good mobility and non tender Assisted supine hip ABD and ADD 2 sets 10 Ankle DF,PF,INV,Ev red tband 2 sets 10   03/06/23 Education Quad sets, ankle pumps, HS to 45 degrees, SLR with max A Gentle patellar mobs  PATIENT EDUCATION:  Education  details: POC, HEP Person educated: Patient Education method: Explanation Education comprehension: verbalized understanding and returned demonstration  HOME EXERCISE PROGRAM: [email or web address]  ASSESSMENT:  CLINICAL IMPRESSION:  Progressed ROM and ex within protocol . Assessed and doc ROM and goals. Pt states he is at gym ex doing TM and weights, cautioned pt about overdoing. Amb in without brace - states MD D/C. Very fatigued with new ex today. Increased swelling under paterlla.Progressed ex with cuing for good speed and control. Pt will continue to benefit from skilled therapy to progress knee as protocol progresses for strength/ROM and func OBJECTIVE IMPAIRMENTS: Abnormal gait, decreased activity tolerance, decreased balance, decreased coordination, difficulty walking, decreased ROM, decreased strength, impaired flexibility, improper body mechanics, postural dysfunction, and pain.   ACTIVITY LIMITATIONS: carrying, lifting, bending, sitting, standing, squatting, sleeping, stairs, transfers, bathing, toileting, dressing, and locomotion level  PARTICIPATION LIMITATIONS: meal prep, cleaning, laundry, driving, shopping, and community activity  PERSONAL FACTORS: Past/current experiences are also affecting patient's functional outcome.   REHAB POTENTIAL: Good  CLINICAL DECISION MAKING: Evolving/moderate complexity  EVALUATION COMPLEXITY: Low   GOALS: Goals reviewed with patient? Yes  SHORT TERM GOALS:  Target date: 03/21/23 I with initial HEP Baseline: Goal status: 03/12/23 MET  LONG TERM GOALS: Target date: 05/29/23  I with final HEP Baseline:  Goal status: INITIAL  2.  Patient will recover R knee AROM to at least 0-120 degrees Baseline: 0-45 Goal status: 04/08/23 progressing   04/22/23 12-105 AROM  3.  Patient will recover R knee strength to 5/5 Baseline: Active, but weak quad contraction achieved. Goal status: 04/22/23 progressing  4.  Patient will perform dynamic SLS activities on RLE on  unstable surface with no LOB Baseline:  Goal status: 04/22/23 progressing  5.  Patient will be able to walk x at least 1000' on all surfaces, including up and down steps, I, no gait abnormalities Baseline:  Goal status: 04/08/23 progressing and 04/22/23  6.  Patient will be able to perform dynamic, power moves such as jumping, hopping, agility ladder drills with no pain or instability in R knee. Baseline:  Goal status: INITIAL   PLAN:  PT FREQUENCY: 1-2x/week  PT DURATION: 12 weeks  PLANNED INTERVENTIONS: 97110-Therapeutic exercises, 97530- Therapeutic activity, [id number]- Neuromuscular re-education, 97535- Self Care, [id number]- Manual therapy, [id number]- Gait training, 97014- Electrical stimulation (unattended), 97016- Vasopneumatic device, Patient/Family education, Balance training, Stair training, Taping, Dry Needling, Joint mobilization, Cryotherapy, and Moist heat  PLAN FOR NEXT SESSION: Proceed per protocol.     Kenly Henckel,ANGIE, PTA 04/22/2023, 8:30 AM  [facility] at [facility] [address] [facility] [place], [place], [address] Phone: [phone]   Fax:  [phone]  Patient Details  Name: Timothy [name] MRN: [id number] Date of Birth: 1971-01-16 Referring Provider:  [name], [name], MD  Encounter Date: 04/22/2023

## 2023-04-23 DIAGNOSIS — N186 End stage renal disease: Secondary | ICD-10-CM | POA: Diagnosis not present

## 2023-04-23 DIAGNOSIS — Z992 Dependence on renal dialysis: Secondary | ICD-10-CM | POA: Diagnosis not present

## 2023-04-23 DIAGNOSIS — R52 Pain, unspecified: Secondary | ICD-10-CM | POA: Diagnosis not present

## 2023-04-23 DIAGNOSIS — D689 Coagulation defect, unspecified: Secondary | ICD-10-CM | POA: Diagnosis not present

## 2023-04-23 DIAGNOSIS — D631 Anemia in chronic kidney disease: Secondary | ICD-10-CM | POA: Diagnosis not present

## 2023-04-23 DIAGNOSIS — N2581 Secondary hyperparathyroidism of renal origin: Secondary | ICD-10-CM | POA: Diagnosis not present

## 2023-04-24 ENCOUNTER — Ambulatory Visit: Payer: 59 | Admitting: Physical Therapy

## 2023-04-24 DIAGNOSIS — M25661 Stiffness of right knee, not elsewhere classified: Secondary | ICD-10-CM

## 2023-04-24 DIAGNOSIS — R2681 Unsteadiness on feet: Secondary | ICD-10-CM | POA: Diagnosis not present

## 2023-04-24 DIAGNOSIS — M6281 Muscle weakness (generalized): Secondary | ICD-10-CM | POA: Diagnosis not present

## 2023-04-24 DIAGNOSIS — R600 Localized edema: Secondary | ICD-10-CM | POA: Diagnosis not present

## 2023-04-24 NOTE — Therapy (Signed)
 Patient Name: Timothy [name] MRN: [id number] DOB:1970-08-10, 53 y.o., male Today's Date: 04/24/2023  END OF SESSION:  PT End of Session - 04/24/23 0749     Visit Number 11    Date for PT Re-Evaluation 05/29/23    PT Start Time 0750    PT Stop Time 0836    PT Time Calculation (min) 46 min             Past Medical History:  Diagnosis Date   Allergies    Anemia    Blind left eye    CHF (congestive heart failure) (HCC)    Chronic kidney disease    ESRD - HD M,W,F   Diabetes mellitus without complication (HCC)    Type2   Dyspnea    Patient denies SOB since he started dialysis. States he went away after he started dialysis   HLD (hyperlipidemia)    Hypertension    Pneumonia    x2   Past Surgical History:  Procedure Laterality Date   A/V FISTULAGRAM Left 11/19/2022   Procedure: A/V Fistulagram;  Surgeon: Nada Libman, MD;  Location: [facility];  Service: Cardiovascular;  Laterality: Left;   AV FISTULA PLACEMENT Left 09/19/2022   Procedure: ARTERIOVENOUS (AV) FISTULA CREATION;  Surgeon: Nada Libman, MD;  Location: [facility] OR;  Service: Vascular;  Laterality: Left;   BIOPSY  12/05/2022   Procedure: BIOPSY;  Surgeon: Napoleon Form, MD;  Location: [facility];  Service: Gastroenterology;;   CATARACT EXTRACTION Right    COLONOSCOPY WITH PROPOFOL N/A 12/05/2022   Procedure: COLONOSCOPY WITH PROPOFOL;  Surgeon: Napoleon Form, MD;  Location: [facility];  Service: Gastroenterology;  Laterality: N/A;   ESOPHAGOGASTRODUODENOSCOPY (EGD) WITH PROPOFOL N/A 12/05/2022   Procedure: ESOPHAGOGASTRODUODENOSCOPY (EGD) WITH PROPOFOL;  Surgeon: Napoleon Form, MD;  Location: [facility];  Service: Gastroenterology;  Laterality: N/A;   FRACTURE SURGERY Left    [place]   KNEE RECONSTRUCTION Right 03/03/2023   Procedure: RIGHT KNEE QUADRICEPS TENDON LENGTHENING AND PATELLA TENDON RECONSTRUCTION WITH ACHILLES ALLOGRAFT;  Surgeon: Huel Cote, MD;   Location: [facility] OR;  Service: Orthopedics;  Laterality: Right;   LIGATION OF COMPETING BRANCHES OF ARTERIOVENOUS FISTULA Left 03/11/2023   Procedure: LIGATION OF COMPETING BRANCHES OF LEFT ARTERIOVENOUS FISTULA;  Surgeon: Daria Pastures, MD;  Location: [facility] OR;  Service: Vascular;  Laterality: Left;   MENISCUS REPAIR Left    PERIPHERAL VASCULAR INTERVENTION  11/19/2022   Procedure: PERIPHERAL VASCULAR INTERVENTION;  Surgeon: Nada Libman, MD;  Location: [facility];  Service: Cardiovascular;;  Embolization coil   POLYPECTOMY  12/05/2022   Procedure: POLYPECTOMY;  Surgeon: Napoleon Form, MD;  Location: [facility];  Service: Gastroenterology;;   Patient Active Problem List   Diagnosis Date Noted   Rupture of right patellar tendon 03/03/2023   Other disorders of phosphorus metabolism 01/04/2023   Heme positive stool 12/05/2022   Generalized abdominal discomfort 12/05/2022   Polyp of ascending colon 12/05/2022   Gastritis and gastroduodenitis 12/05/2022   Gastric polyp 12/05/2022   Foot callus 12/03/2022   Iron deficiency anemia, unspecified 11/01/2022   Angiodysplasia of stomach and duodenum with bleeding 09/06/2022   Other iron deficiency anemias 08/30/2022   Allergy, unspecified, initial encounter 08/20/2022   Anaphylactic reaction due to adverse effect of correct drug or medicament properly administered, initial encounter 08/20/2022   Anaphylactic shock, unspecified, initial encounter 08/20/2022   Anemia in chronic kidney disease 08/20/2022   Coagulation defect, unspecified (HCC)  08/20/2022   Diarrhea, unspecified 08/20/2022   Encounter for immunization 08/20/2022   Encounter for screening for respiratory tuberculosis 08/20/2022   End stage renal disease (HCC) 08/20/2022   Fever, unspecified 08/20/2022   Nausea 08/20/2022   Pain, unspecified 08/20/2022   Pruritus, unspecified 08/20/2022   Dependence on renal dialysis (HCC) 08/16/2022   Hyperlipidemia, unspecified  08/16/2022   Long term (current) use of insulin (HCC) 08/16/2022   Secondary hyperparathyroidism of renal origin (HCC) 08/16/2022   Vitamin D deficiency, unspecified 08/16/2022   Hematemesis 08/05/2022   Erectile dysfunction 01/18/2021   Pneumonia 11/15/2020   Hemoptysis 11/15/2020   CKD (chronic kidney disease), stage IV (HCC) 11/15/2020   Positive D dimer 11/15/2020   Knee injury, right, subsequent encounter 09/07/2020   Type 2 diabetes mellitus (HCC) 08/22/2020   Acute respiratory failure with hypoxia (HCC) 06/22/2020   Multifocal pneumonia 06/22/2020   Accelerated hypertension 06/20/2019   Elevated troponin 06/19/2019   Chronic diastolic CHF (congestive heart failure) (HCC) 06/19/2019   Hypoalbuminemia 06/19/2019   Hypertensive urgency 06/19/2019   AKI (acute kidney injury) (HCC) 06/19/2019   CHF (congestive heart failure) (HCC) 06/19/2019    PCP: [name], [name], MD  REFERRING PROVIDER: Huel Cote, MD   REFERRING DIAG: [phone] (ICD-10-CM) - Rupture of right patellar tendon, initial encounter   THERAPY DIAG:  Stiffness of right knee, not elsewhere classified  Muscle weakness (generalized)  Rationale for Evaluation and Treatment: Rehabilitation  ONSET DATE: 02/19/23, 03/03/23-right knee quadriceps tendon lengthening and patella tendon reconstruction with Achilles allograft   SUBJECTIVE:   SUBJECTIVE STATEMENT: muscle sore after last session. Elliptical and leg press at gym,tried ot run on TM  PERTINENT HISTORY: Per referring physician note: PMHx: anemia, Blind L eye, CHF, CKD, stage 4, DM- on Dialysis, HLD, HTN, L patellar tendon repair about 20 years ago. Right knee with a proximately retracted patella. He is not able to actively extend against gravity. Range of motion of the right knee is from approximately 20 degrees to 40 degrees. There is tenderness about the remaining trochlear groove.  53 y.o. 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  MRI does not show significant osteoarthritis and given that I do believe he would benefit from a VY lengthening of the quadriceps with the patella tendon reconstruction with allograft Achilles.   PAIN:  Are you having pain? Yes: NPRS scale: 0/10 Pain location: R knee Pain description: ache Aggravating factors: Worst at night Relieving factors: Ice  PRECAUTIONS: Knee,   RED FLAGS: None   WEIGHT BEARING RESTRICTIONS: Yes WBAT with brace and crutches  FALLS:  Has patient fallen in last 6 months? No  LIVING ENVIRONMENT: Lives with: lives with their family Lives in: House/apartment Stairs: No Has following equipment at home: None  OCCUPATION: On disability  PLOF: Independent  PATIENT GOALS: Patient would like to recover his ability to walk around normally.  NEXT MD VISIT: about 2 weeks  OBJECTIVE:  Note: Objective measures were completed at Evaluation unless otherwise noted.  DIAGNOSTIC FINDINGS:  03/03/23 Xray (4 views right knee): Evidence of an old patella tendon avulsion with significant retraction and osteoarthritis of the patella   MRI right knee: There is full-thickness chronic patella tendon rupture without significant patellofemoral cartilage loss  COGNITION: Overall cognitive status: Within functional limits for tasks assessed     SENSATION: WFL  EDEMA:  None observed  MUSCLE LENGTH: Hamstrings: WNL  POSTURE: Stands with weight shifted to LLE  PALPATION: Patient's dressings prevent full observation of knee, gentle patellar mobs performed, no pain  LOWER EXTREMITY ROM: LLE WNL  Passive ROM Right eval Left eval RT sitting 04/22/23 AROM   Hip flexion     Hip extension     Hip abduction     Hip adduction     Hip internal rotation     Hip external rotation     Knee flexion 45  105  Knee extension 0  12  Ankle dorsiflexion 0    Ankle plantarflexion 52    Ankle inversion     Ankle eversion       (Blank rows = not tested)  LOWER EXTREMITY MMT: LLE 5/5, R hip at least 4/5, knee- active quad set- weak, ankle WNL  LOWER EXTREMITY SPECIAL TESTS:  N/T  FUNCTIONAL TESTS:  N/T  GAIT: Distance walked: In clinic distances Assistive device utilized: None Level of assistance: Modified independence Comments: Patient reports he did not receive crutches after surgery. Later reports that his girlfriend has crutches he can adjust. He had this same surgery on LLE about 20 years ago, reports he can safely use crutches, declined to practice in the clinic.                                                                                                                              TREATMENT DATE:   04/24/23 Nustep L 5 LE only Leg Press ( limited ROM per protocol) 20# RT leg only 10 x 2 sets . Unlocked 10x for ROM.calf raises 30# 2 sets 15 Green tband HS curl 3 sets 10 LAQ 2 sets 10 hold at TKE 3 sec, PTA to inhibit hip flexor Attempted SAQ multi various but could not get quad to fire enough to get heel off plinth STS with left leg straight out with emphasize on RT to get up and extend. 3 sets 5 4 inch step ups RLE 10 x then 6 inch 10 x Bike FULL rev 6 min L 2       04/22/23 Nustep L 5 Black Bar 20x DF and PF Leg Press ( limited ROM per protocol) 20# RT leg only 10 x 2 sets  SLS RT leg with vector swings left LE 10 x without UE - very difficult TM pull 20x  4 inch step ups RT leg working on correct tech and quad activation 2 sets 10 ,fwd and laterally  STS on airex 10x STS with Left leg on 4 inch step 10 x Resisted TKE 2 sets 10 Mini SL squats 10 x   04/17/23 NuStep L4 x 6 min R knee PROM with end range mobs within protocol RLE quad sets x10 RLE SLR 2x10  W/ ER x10 Eccentric SAQ with half black foam roll 2x10 RLE Multi angle extension isometrics  Bike L 1 x 2 min Heel raises black bar 2x15 4in step  ups  RLE 2x5   04/15/23 NuStep L4  x 6 min 20lb resisted gait 4  way x 3 each Sit to stand form elevated surface  RLE Multi angle extension isometrics  RLE HS curls green 2x10 SLR RLE 2x10  W/ ER 2x10 Side lying hip add 2x10 RLE Prone hip Ext RLE   04/08/23 AROM sitting EOB SOC 65, after PROM per protocol AROM 85, 90 pass as per protocol Seated HS red tband 2 sets 10 RLE SLR, SLR w/ ER, Hip Add, Hip abd, Hip Ext x 10 with 2# and cued to activate and engage quad 1st. 2nd set no wt- quad lag with SLRs Alt box taps 4in 20 x 2 sets - PTA supporting RT knee Standing toe and heel raises on black bar 2x10 Resisted gait 4 ways 5 x each with brace on 30#- brace on Gait 347ft focusing on heel strike, hip and knee flex  04/03/23 R knee patellar mobs  RLE SLR, SLR w/ ER, Hip Add, Hip abd, Hip Ext x 10 for two rounds  R ankle 4 way x 10 green  R knee PROM with end range mobs within protocol Standing toe and heel raises on black bar 2x10 Alt box taps 4in Gait 386ft focusing on heel strike, hip and knee flex  03/27/23 R knee patellar mobs  RLE SLR, SLR w/ ER, Hip Add, Hip abd, Hip Ext x 10 for two rounds  R ankle 4 way x 10 green  R knee PROM with end range mobs within protocol   03/20/23 Black bar heel and toe raises 2 sets 10 with brace on Resisted gait 4 ways 5 x each with brace on 30#- brace on Standing hip cable pulleys 4 way 5# 10 x BIL- brace on Feet on ball small bridge 2 sets 10 Feet on ball small HS curl < 60 degrees 2 sets 10, the isometric HS  Estim RUSSIAN 10 min quad set 5sec on/5 sec off STW to RT knee, quad and incision    03/18/23 Black bar heel and toe raises 2 sets 10 with brace on Resisted gait 4 ways 5 x each with brace on 20 # SLR 4 way lying 2 sets 10 Feet on ball small bridge 2 sets 10 Feet on ball small HS curl < 60 degrees 2 sets 10, the isometric HS  STW to RT knee, quad and incision ( okayed to massage scar at home ,instructed and he VU) tightness ITB and lateral quad  03/11/23 Quad set hold 3 sec 10x Quad set with min a  SLR 2 sets 10 HS slide  to 30 degrees 10 x 2 sets HS press down 10x hold 3 sec Patellar mobs with good mobility and non tender Assisted supine hip ABD and ADD 2 sets 10 Ankle DF,PF,INV,Ev red tband 2 sets 10   03/06/23 Education Quad sets, ankle pumps, HS to 45 degrees, SLR with max A Gentle patellar mobs  PATIENT EDUCATION:  Education details: POC, HEP Person educated: Patient Education method: Explanation Education comprehension: verbalized understanding and returned demonstration  HOME EXERCISE PROGRAM: [id number]  ASSESSMENT:  CLINICAL IMPRESSION:  pt really struggles with quad activation, a lot of compensation an dneeds verb and tactile cuing. Significant quad lag d/t weakness. Pt verb at gym cautioned pt to stick to protocol limitations and after pt stated tried running on TM advised to D/C this activity OBJECTIVE IMPAIRMENTS: Abnormal gait, decreased activity tolerance, decreased balance, decreased coordination, difficulty walking, decreased ROM, decreased strength, impaired flexibility, improper body mechanics, postural dysfunction, and pain.  ACTIVITY LIMITATIONS: carrying, lifting, bending, sitting, standing, squatting, sleeping, stairs, transfers, bathing, toileting, dressing, and locomotion level  PARTICIPATION LIMITATIONS: meal prep, cleaning, laundry, driving, shopping, and community activity  PERSONAL FACTORS: Past/current experiences are also affecting patient's functional outcome.   REHAB POTENTIAL: Good  CLINICAL DECISION MAKING: Evolving/moderate complexity  EVALUATION COMPLEXITY: Low   GOALS: Goals reviewed with patient? Yes  SHORT TERM GOALS: Target date: 03/21/23 I with initial HEP Baseline: Goal status: 03/12/23 MET  LONG TERM GOALS: Target date: 05/29/23  I with final HEP Baseline:  Goal status: INITIAL  2.  Patient will recover R knee AROM to at least 0-120 degrees Baseline: 0-45 Goal status: 04/08/23 progressing   04/22/23 12-105 AROM  3.   Patient will recover R knee strength to 5/5 Baseline: Active, but weak quad contraction achieved. Goal status: 04/22/23 progressing  4.  Patient will perform dynamic SLS activities on RLE on unstable surface with no LOB Baseline:  Goal status: 04/22/23 progressing  5.  Patient will be able to walk x at least 1000' on all surfaces, including up and down steps, I, no gait abnormalities Baseline:  Goal status: 04/08/23 progressing and 04/22/23  6.  Patient will be able to perform dynamic, power moves such as jumping, hopping, agility ladder drills with no pain or instability in R knee. Baseline:  Goal status: INITIAL   PLAN:  PT FREQUENCY: 1-2x/week  PT DURATION: 12 weeks  PLANNED INTERVENTIONS: 97110-Therapeutic exercises, 97530- Therapeutic activity, [id number]- Neuromuscular re-education, 97535- Self Care, 13086- Manual therapy, [id number]- Gait training, 97014- Electrical stimulation (unattended), 97016- Vasopneumatic device, Patient/Family education, Balance training, Stair training, Taping, Dry Needling, Joint mobilization, Cryotherapy, and Moist heat  PLAN FOR NEXT SESSION: Proceed per protocol. TRY RUSSIAN STIM TO QUAD WITH SAQ     Cyla Haluska,ANGIE, PTA 04/24/2023, 8:32 AM  [facility] at [facility] [address] [facility]. [place], [place], [address] Phone: [phone]   Fax:  [phone]  Patient Details  Name: Timothy [name] MRN: [id number] Date of Birth: 22-Aug-1970 Referring Provider:  [name], [name], MD  Encounter Date: 04/24/2023

## 2023-04-25 DIAGNOSIS — D631 Anemia in chronic kidney disease: Secondary | ICD-10-CM | POA: Diagnosis not present

## 2023-04-25 DIAGNOSIS — N2581 Secondary hyperparathyroidism of renal origin: Secondary | ICD-10-CM | POA: Diagnosis not present

## 2023-04-25 DIAGNOSIS — Z992 Dependence on renal dialysis: Secondary | ICD-10-CM | POA: Diagnosis not present

## 2023-04-25 DIAGNOSIS — N186 End stage renal disease: Secondary | ICD-10-CM | POA: Diagnosis not present

## 2023-04-25 DIAGNOSIS — R52 Pain, unspecified: Secondary | ICD-10-CM | POA: Diagnosis not present

## 2023-04-25 DIAGNOSIS — D689 Coagulation defect, unspecified: Secondary | ICD-10-CM | POA: Diagnosis not present

## 2023-04-28 DIAGNOSIS — D631 Anemia in chronic kidney disease: Secondary | ICD-10-CM | POA: Diagnosis not present

## 2023-04-28 DIAGNOSIS — R52 Pain, unspecified: Secondary | ICD-10-CM | POA: Diagnosis not present

## 2023-04-28 DIAGNOSIS — N186 End stage renal disease: Secondary | ICD-10-CM | POA: Diagnosis not present

## 2023-04-28 DIAGNOSIS — Z992 Dependence on renal dialysis: Secondary | ICD-10-CM | POA: Diagnosis not present

## 2023-04-28 DIAGNOSIS — D689 Coagulation defect, unspecified: Secondary | ICD-10-CM | POA: Diagnosis not present

## 2023-04-28 DIAGNOSIS — N2581 Secondary hyperparathyroidism of renal origin: Secondary | ICD-10-CM | POA: Diagnosis not present

## 2023-04-29 ENCOUNTER — Encounter: Payer: Self-pay | Admitting: Physical Therapy

## 2023-04-29 ENCOUNTER — Ambulatory Visit: Payer: 59 | Admitting: Physical Therapy

## 2023-04-29 DIAGNOSIS — M25661 Stiffness of right knee, not elsewhere classified: Secondary | ICD-10-CM

## 2023-04-29 DIAGNOSIS — M6281 Muscle weakness (generalized): Secondary | ICD-10-CM

## 2023-04-29 DIAGNOSIS — R600 Localized edema: Secondary | ICD-10-CM

## 2023-04-29 DIAGNOSIS — R2681 Unsteadiness on feet: Secondary | ICD-10-CM | POA: Diagnosis not present

## 2023-04-29 NOTE — Therapy (Signed)
 Patient Name: Timothy [name] MRN: [id number] DOB:Jun 29, 1970, 53 y.o., male Today's Date: 04/29/2023  END OF SESSION:  PT End of Session - 04/29/23 0801     Visit Number 12    Date for PT Re-Evaluation 05/29/23    PT Start Time 0758    PT Stop Time 0842    PT Time Calculation (min) 44 min    Activity Tolerance Patient tolerated treatment well    Behavior During Therapy [facility] for tasks assessed/performed             Past Medical History:  Diagnosis Date   Allergies    Anemia    Blind left eye    CHF (congestive heart failure) (HCC)    Chronic kidney disease    ESRD - HD M,W,F   Diabetes mellitus without complication (HCC)    Type2   Dyspnea    Patient denies SOB since he started dialysis. States he went away after he started dialysis   HLD (hyperlipidemia)    Hypertension    Pneumonia    x2   Past Surgical History:  Procedure Laterality Date   A/V FISTULAGRAM Left 11/19/2022   Procedure: A/V Fistulagram;  Surgeon: Nada Libman, MD;  Location: [facility];  Service: Cardiovascular;  Laterality: Left;   AV FISTULA PLACEMENT Left 09/19/2022   Procedure: ARTERIOVENOUS (AV) FISTULA CREATION;  Surgeon: Nada Libman, MD;  Location: [facility] OR;  Service: Vascular;  Laterality: Left;   BIOPSY  12/05/2022   Procedure: BIOPSY;  Surgeon: Napoleon Form, MD;  Location: [facility];  Service: Gastroenterology;;   CATARACT EXTRACTION Right    COLONOSCOPY WITH PROPOFOL N/A 12/05/2022   Procedure: COLONOSCOPY WITH PROPOFOL;  Surgeon: Napoleon Form, MD;  Location: [facility];  Service: Gastroenterology;  Laterality: N/A;   ESOPHAGOGASTRODUODENOSCOPY (EGD) WITH PROPOFOL N/A 12/05/2022   Procedure: ESOPHAGOGASTRODUODENOSCOPY (EGD) WITH PROPOFOL;  Surgeon: Napoleon Form, MD;  Location: [facility];  Service: Gastroenterology;  Laterality: N/A;   FRACTURE SURGERY Left    [place]   KNEE RECONSTRUCTION Right 03/03/2023   Procedure: RIGHT KNEE  QUADRICEPS TENDON LENGTHENING AND PATELLA TENDON RECONSTRUCTION WITH ACHILLES ALLOGRAFT;  Surgeon: Huel Cote, MD;  Location: [facility] OR;  Service: Orthopedics;  Laterality: Right;   LIGATION OF COMPETING BRANCHES OF ARTERIOVENOUS FISTULA Left 03/11/2023   Procedure: LIGATION OF COMPETING BRANCHES OF LEFT ARTERIOVENOUS FISTULA;  Surgeon: Daria Pastures, MD;  Location: [facility] OR;  Service: Vascular;  Laterality: Left;   MENISCUS REPAIR Left    PERIPHERAL VASCULAR INTERVENTION  11/19/2022   Procedure: PERIPHERAL VASCULAR INTERVENTION;  Surgeon: Nada Libman, MD;  Location: [facility];  Service: Cardiovascular;;  Embolization coil   POLYPECTOMY  12/05/2022   Procedure: POLYPECTOMY;  Surgeon: Napoleon Form, MD;  Location: [facility];  Service: Gastroenterology;;   Patient Active Problem List   Diagnosis Date Noted   Rupture of right patellar tendon 03/03/2023   Other disorders of phosphorus metabolism 01/04/2023   Heme positive stool 12/05/2022   Generalized abdominal discomfort 12/05/2022   Polyp of ascending colon 12/05/2022   Gastritis and gastroduodenitis 12/05/2022   Gastric polyp 12/05/2022   Foot callus 12/03/2022   Iron deficiency anemia, unspecified 11/01/2022   Angiodysplasia of stomach and duodenum with bleeding 09/06/2022   Other iron deficiency anemias 08/30/2022   Allergy, unspecified, initial encounter 08/20/2022   Anaphylactic reaction due to adverse effect of correct drug or medicament properly administered, initial encounter 08/20/2022   Anaphylactic  shock, unspecified, initial encounter 08/20/2022   Anemia in chronic kidney disease 08/20/2022   Coagulation defect, unspecified (HCC) 08/20/2022   Diarrhea, unspecified 08/20/2022   Encounter for immunization 08/20/2022   Encounter for screening for respiratory tuberculosis 08/20/2022   End stage renal disease (HCC) 08/20/2022   Fever, unspecified 08/20/2022   Nausea 08/20/2022   Pain, unspecified  08/20/2022   Pruritus, unspecified 08/20/2022   Dependence on renal dialysis (HCC) 08/16/2022   Hyperlipidemia, unspecified 08/16/2022   Long term (current) use of insulin (HCC) 08/16/2022   Secondary hyperparathyroidism of renal origin (HCC) 08/16/2022   Vitamin D deficiency, unspecified 08/16/2022   Hematemesis 08/05/2022   Erectile dysfunction 01/18/2021   Pneumonia 11/15/2020   Hemoptysis 11/15/2020   CKD (chronic kidney disease), stage IV (HCC) 11/15/2020   Positive D dimer 11/15/2020   Knee injury, right, subsequent encounter 09/07/2020   Type 2 diabetes mellitus (HCC) 08/22/2020   Acute respiratory failure with hypoxia (HCC) 06/22/2020   Multifocal pneumonia 06/22/2020   Accelerated hypertension 06/20/2019   Elevated troponin 06/19/2019   Chronic diastolic CHF (congestive heart failure) (HCC) 06/19/2019   Hypoalbuminemia 06/19/2019   Hypertensive urgency 06/19/2019   AKI (acute kidney injury) (HCC) 06/19/2019   CHF (congestive heart failure) (HCC) 06/19/2019    PCP: [name], [name], MD  REFERRING PROVIDER: Huel Cote, MD   REFERRING DIAG: [phone] (ICD-10-CM) - Rupture of right patellar tendon, initial encounter   THERAPY DIAG:  Muscle weakness (generalized)  Stiffness of right knee, not elsewhere classified  Localized edema  Rationale for Evaluation and Treatment: Rehabilitation  ONSET DATE: 02/19/23, 03/03/23-right knee quadriceps tendon lengthening and patella tendon reconstruction with Achilles allograft   SUBJECTIVE:   SUBJECTIVE STATEMENT: Going ok, frustrating at times  PERTINENT HISTORY: Per referring physician note: PMHx: anemia, Blind L eye, CHF, CKD, stage 4, DM- on Dialysis, HLD, HTN, L patellar tendon repair about 20 years ago. Right knee with a proximately retracted patella. He is not able to actively extend against gravity. Range of motion of the right knee is from approximately 20 degrees to 40 degrees. There is tenderness about the  remaining trochlear groove.  53 y.o. 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  MRI does not show significant osteoarthritis and given that I do believe he would benefit from a VY lengthening of the quadriceps with the patella tendon reconstruction with allograft Achilles.   PAIN:  Are you having pain? Yes: NPRS scale: 0/10 Pain location: R knee Pain description: ache Aggravating factors: Worst at night Relieving factors: Ice  PRECAUTIONS: Knee,   RED FLAGS: None   WEIGHT BEARING RESTRICTIONS: Yes WBAT with brace and crutches  FALLS:  Has patient fallen in last 6 months? No  LIVING ENVIRONMENT: Lives with: lives with their family Lives in: House/apartment Stairs: No Has following equipment at home: None  OCCUPATION: On disability  PLOF: Independent  PATIENT GOALS: Patient would like to recover his ability to walk around normally.  NEXT MD VISIT: about 2 weeks  OBJECTIVE:  Note: Objective measures were completed at Evaluation unless otherwise noted.  DIAGNOSTIC FINDINGS:  03/03/23 Xray (4 views right knee): Evidence of an old patella tendon avulsion with significant retraction and osteoarthritis of the patella   MRI right knee: There is full-thickness chronic patella tendon rupture without significant patellofemoral cartilage loss  COGNITION: Overall cognitive status: Within functional limits for tasks assessed     SENSATION: WFL  EDEMA:  None observed  MUSCLE  LENGTH: Hamstrings: WNL  POSTURE: Stands with weight shifted to LLE  PALPATION: Patient's dressings prevent full observation of knee, gentle patellar mobs performed, no pain  LOWER EXTREMITY ROM: LLE WNL  Passive ROM Right eval Left eval RT sitting 04/22/23 AROM   Hip flexion     Hip extension     Hip abduction     Hip adduction     Hip internal rotation     Hip external rotation     Knee flexion 45  105  Knee extension  0  12  Ankle dorsiflexion 0    Ankle plantarflexion 52    Ankle inversion     Ankle eversion      (Blank rows = not tested)  LOWER EXTREMITY MMT: LLE 5/5, R hip at least 4/5, knee- active quad set- weak, ankle WNL  LOWER EXTREMITY SPECIAL TESTS:  N/T  FUNCTIONAL TESTS:  N/T  GAIT: Distance walked: In clinic distances Assistive device utilized: None Level of assistance: Modified independence Comments: Patient reports he did not receive crutches after surgery. Later reports that his girlfriend has crutches he can adjust. He had this same surgery on LLE about 20 years ago, reports he can safely use crutches, declined to practice in the clinic.                                                                                                                              TREATMENT DATE:  04/29/23 Bike L1 x5 min LAQ 3 sets 10 hold at TKE 3 sec, PTA to inhibit hip flexor RLE SAQ RLE 2x10 Little heel elevation  RLE SLR 2x10 40lb resisted backwards walking 2x8 6in step ups x 10 each Sit to stands 3 x 10 holding blue ball   04/24/23 Nustep L 5 LE only Leg Press ( limited ROM per protocol) 20# RT leg only 10 x 2 sets . Unlocked 10x for ROM.calf raises 30# 2 sets 15 Green tband HS curl 3 sets 10 LAQ 2 sets 10 hold at TKE 3 sec, PTA to inhibit hip flexor Attempted SAQ multi various but could not get quad to fire enough to get heel off plinth STS with left leg straight out with emphasize on RT to get up and extend. 3 sets 5 4 inch step ups RLE 10 x then 6 inch 10 x Bike FULL rev 6 min L 2       04/22/23 Nustep L 5 Black Bar 20x DF and PF Leg Press ( limited ROM per protocol) 20# RT leg only 10 x 2 sets  SLS RT leg with vector swings left LE 10 x without UE - very difficult TM pull 20x  4 inch step ups RT leg working on correct tech and quad activation 2 sets 10 ,fwd and laterally  STS on airex 10x STS with Left leg on 4 inch step 10 x Resisted TKE 2 sets 10 Mini SL  squats 10 x  04/17/23 NuStep L4 x 6 min R knee PROM with end range mobs within protocol RLE quad sets x10 RLE SLR 2x10  W/ ER x10 Eccentric SAQ with half black foam roll 2x10 RLE Multi angle extension isometrics  Bike L 1 x 2 min Heel raises black bar 2x15 4in step  ups  RLE 2x5   04/15/23 NuStep L4 x 6 min 20lb resisted gait 4 way x 3 each Sit to stand form elevated surface  RLE Multi angle extension isometrics  RLE HS curls green 2x10 SLR RLE 2x10  W/ ER 2x10 Side lying hip add 2x10 RLE Prone hip Ext RLE   04/08/23 AROM sitting EOB SOC 65, after PROM per protocol AROM 85, 90 pass as per protocol Seated HS red tband 2 sets 10 RLE SLR, SLR w/ ER, Hip Add, Hip abd, Hip Ext x 10 with 2# and cued to activate and engage quad 1st. 2nd set no wt- quad lag with SLRs Alt box taps 4in 20 x 2 sets - PTA supporting RT knee Standing toe and heel raises on black bar 2x10 Resisted gait 4 ways 5 x each with brace on 30#- brace on Gait 352ft focusing on heel strike, hip and knee flex  04/03/23 R knee patellar mobs  RLE SLR, SLR w/ ER, Hip Add, Hip abd, Hip Ext x 10 for two rounds  R ankle 4 way x 10 green  R knee PROM with end range mobs within protocol Standing toe and heel raises on black bar 2x10 Alt box taps 4in Gait 381ft focusing on heel strike, hip and knee flex  03/27/23 R knee patellar mobs  RLE SLR, SLR w/ ER, Hip Add, Hip abd, Hip Ext x 10 for two rounds  R ankle 4 way x 10 green  R knee PROM with end range mobs within protocol   03/20/23 Black bar heel and toe raises 2 sets 10 with brace on Resisted gait 4 ways 5 x each with brace on 30#- brace on Standing hip cable pulleys 4 way 5# 10 x BIL- brace on Feet on ball small bridge 2 sets 10 Feet on ball small HS curl < 60 degrees 2 sets 10, the isometric HS  Estim RUSSIAN 10 min quad set 5sec on/5 sec off STW to RT knee, quad and incision    03/18/23 Black bar heel and toe raises 2 sets 10 with brace on Resisted gait  4 ways 5 x each with brace on 20 # SLR 4 way lying 2 sets 10 Feet on ball small bridge 2 sets 10 Feet on ball small HS curl < 60 degrees 2 sets 10, the isometric HS  STW to RT knee, quad and incision ( okayed to massage scar at home ,instructed and he VU) tightness ITB and lateral quad  03/11/23 Quad set hold 3 sec 10x Quad set with min a SLR 2 sets 10 HS slide  to 30 degrees 10 x 2 sets HS press down 10x hold 3 sec Patellar mobs with good mobility and non tender Assisted supine hip ABD and ADD 2 sets 10 Ankle DF,PF,INV,Ev red tband 2 sets 10   03/06/23 Education Quad sets, ankle pumps, HS to 45 degrees, SLR with max A Gentle patellar mobs  PATIENT EDUCATION:  Education details: POC, HEP Person educated: Patient Education method: Explanation Education comprehension: verbalized understanding and returned demonstration  HOME EXERCISE PROGRAM: [email or web address]  ASSESSMENT:  CLINICAL IMPRESSION:  pt really struggles achieving enough quad activation to achieve full R  knee TKE despite having the available ROM. Significant quad lag d/t weakness. Compensatory techniques uses with resisted backwards walking and step ups due to RLE wekaness. Cues for equal weight distribution with sit tos stands.   OBJECTIVE IMPAIRMENTS: Abnormal gait, decreased activity tolerance, decreased balance, decreased coordination, difficulty walking, decreased ROM, decreased strength, impaired flexibility, improper body mechanics, postural dysfunction, and pain.   ACTIVITY LIMITATIONS: carrying, lifting, bending, sitting, standing, squatting, sleeping, stairs, transfers, bathing, toileting, dressing, and locomotion level  PARTICIPATION LIMITATIONS: meal prep, cleaning, laundry, driving, shopping, and community activity  PERSONAL FACTORS: Past/current experiences are also affecting patient's functional outcome.   REHAB POTENTIAL: Good  CLINICAL DECISION MAKING: Evolving/moderate complexity  EVALUATION COMPLEXITY:  Low   GOALS: Goals reviewed with patient? Yes  SHORT TERM GOALS: Target date: 03/21/23 I with initial HEP Baseline: Goal status: 03/12/23 MET  LONG TERM GOALS: Target date: 05/29/23  I with final HEP Baseline:  Goal status: INITIAL  2.  Patient will recover R knee AROM to at least 0-120 degrees Baseline: 0-45 Goal status: 04/08/23 progressing   04/22/23 12-105 AROM  3.  Patient will recover R knee strength to 5/5 Baseline: Active, but weak quad contraction achieved. Goal status: 04/22/23 progressing  4.  Patient will perform dynamic SLS activities on RLE on unstable surface with no LOB Baseline:  Goal status: 04/22/23 progressing  5.  Patient will be able to walk x at least 1000' on all surfaces, including up and down steps, I, no gait abnormalities Baseline:  Goal status: 04/08/23 progressing and 04/22/23  6.  Patient will be able to perform dynamic, power moves such as jumping, hopping, agility ladder drills with no pain or instability in R knee. Baseline:  Goal status: INITIAL   PLAN:  PT FREQUENCY: 1-2x/week  PT DURATION: 12 weeks  PLANNED INTERVENTIONS: 97110-Therapeutic exercises, 97530- Therapeutic activity, [id number]- Neuromuscular re-education, 97535- Self Care, [id number]- Manual therapy, [id number]- Gait training, 97014- Electrical stimulation (unattended), 97016- Vasopneumatic device, Patient/Family education, Balance training, Stair training, Taping, Dry Needling, Joint mobilization, Cryotherapy, and Moist heat  PLAN FOR NEXT SESSION: Proceed per protocol. TRY RUSSIAN STIM TO QUAD WITH SAQ     Grayce Sessions, PTA 04/29/2023, 8:01 AM

## 2023-04-30 DIAGNOSIS — N186 End stage renal disease: Secondary | ICD-10-CM | POA: Diagnosis not present

## 2023-04-30 DIAGNOSIS — R52 Pain, unspecified: Secondary | ICD-10-CM | POA: Diagnosis not present

## 2023-04-30 DIAGNOSIS — Z992 Dependence on renal dialysis: Secondary | ICD-10-CM | POA: Diagnosis not present

## 2023-04-30 DIAGNOSIS — D631 Anemia in chronic kidney disease: Secondary | ICD-10-CM | POA: Diagnosis not present

## 2023-04-30 DIAGNOSIS — N2581 Secondary hyperparathyroidism of renal origin: Secondary | ICD-10-CM | POA: Diagnosis not present

## 2023-04-30 DIAGNOSIS — D689 Coagulation defect, unspecified: Secondary | ICD-10-CM | POA: Diagnosis not present

## 2023-05-01 ENCOUNTER — Ambulatory Visit: Payer: 59 | Admitting: Physical Therapy

## 2023-05-02 DIAGNOSIS — N186 End stage renal disease: Secondary | ICD-10-CM | POA: Diagnosis not present

## 2023-05-02 DIAGNOSIS — N2581 Secondary hyperparathyroidism of renal origin: Secondary | ICD-10-CM | POA: Diagnosis not present

## 2023-05-02 DIAGNOSIS — D689 Coagulation defect, unspecified: Secondary | ICD-10-CM | POA: Diagnosis not present

## 2023-05-02 DIAGNOSIS — Z992 Dependence on renal dialysis: Secondary | ICD-10-CM | POA: Diagnosis not present

## 2023-05-02 DIAGNOSIS — R52 Pain, unspecified: Secondary | ICD-10-CM | POA: Diagnosis not present

## 2023-05-02 DIAGNOSIS — D631 Anemia in chronic kidney disease: Secondary | ICD-10-CM | POA: Diagnosis not present

## 2023-05-05 DIAGNOSIS — R52 Pain, unspecified: Secondary | ICD-10-CM | POA: Diagnosis not present

## 2023-05-05 DIAGNOSIS — E1122 Type 2 diabetes mellitus with diabetic chronic kidney disease: Secondary | ICD-10-CM | POA: Diagnosis not present

## 2023-05-05 DIAGNOSIS — D689 Coagulation defect, unspecified: Secondary | ICD-10-CM | POA: Diagnosis not present

## 2023-05-05 DIAGNOSIS — Z992 Dependence on renal dialysis: Secondary | ICD-10-CM | POA: Diagnosis not present

## 2023-05-05 DIAGNOSIS — N186 End stage renal disease: Secondary | ICD-10-CM | POA: Diagnosis not present

## 2023-05-05 DIAGNOSIS — D631 Anemia in chronic kidney disease: Secondary | ICD-10-CM | POA: Diagnosis not present

## 2023-05-05 DIAGNOSIS — N2581 Secondary hyperparathyroidism of renal origin: Secondary | ICD-10-CM | POA: Diagnosis not present

## 2023-05-06 ENCOUNTER — Ambulatory Visit: Attending: Family Medicine | Admitting: Physical Therapy

## 2023-05-06 DIAGNOSIS — M25661 Stiffness of right knee, not elsewhere classified: Secondary | ICD-10-CM | POA: Insufficient documentation

## 2023-05-06 DIAGNOSIS — M6281 Muscle weakness (generalized): Secondary | ICD-10-CM | POA: Insufficient documentation

## 2023-05-06 DIAGNOSIS — R2681 Unsteadiness on feet: Secondary | ICD-10-CM | POA: Insufficient documentation

## 2023-05-06 DIAGNOSIS — R600 Localized edema: Secondary | ICD-10-CM | POA: Insufficient documentation

## 2023-05-07 DIAGNOSIS — R52 Pain, unspecified: Secondary | ICD-10-CM | POA: Diagnosis not present

## 2023-05-07 DIAGNOSIS — E1122 Type 2 diabetes mellitus with diabetic chronic kidney disease: Secondary | ICD-10-CM | POA: Diagnosis not present

## 2023-05-07 DIAGNOSIS — D689 Coagulation defect, unspecified: Secondary | ICD-10-CM | POA: Diagnosis not present

## 2023-05-07 DIAGNOSIS — Z992 Dependence on renal dialysis: Secondary | ICD-10-CM | POA: Diagnosis not present

## 2023-05-07 DIAGNOSIS — N2581 Secondary hyperparathyroidism of renal origin: Secondary | ICD-10-CM | POA: Diagnosis not present

## 2023-05-07 DIAGNOSIS — D631 Anemia in chronic kidney disease: Secondary | ICD-10-CM | POA: Diagnosis not present

## 2023-05-07 DIAGNOSIS — N186 End stage renal disease: Secondary | ICD-10-CM | POA: Diagnosis not present

## 2023-05-08 ENCOUNTER — Ambulatory Visit: Admitting: Physical Therapy

## 2023-05-08 ENCOUNTER — Encounter: Payer: Self-pay | Admitting: Physical Therapy

## 2023-05-08 DIAGNOSIS — R2681 Unsteadiness on feet: Secondary | ICD-10-CM | POA: Diagnosis not present

## 2023-05-08 DIAGNOSIS — M25661 Stiffness of right knee, not elsewhere classified: Secondary | ICD-10-CM

## 2023-05-08 DIAGNOSIS — M6281 Muscle weakness (generalized): Secondary | ICD-10-CM

## 2023-05-08 DIAGNOSIS — R600 Localized edema: Secondary | ICD-10-CM | POA: Diagnosis not present

## 2023-05-08 DIAGNOSIS — E113511 Type 2 diabetes mellitus with proliferative diabetic retinopathy with macular edema, right eye: Secondary | ICD-10-CM | POA: Diagnosis not present

## 2023-05-08 DIAGNOSIS — Z961 Presence of intraocular lens: Secondary | ICD-10-CM | POA: Diagnosis not present

## 2023-05-08 LAB — HM DIABETES EYE EXAM

## 2023-05-08 NOTE — Therapy (Signed)
 Patient Name: Timothy [name] MRN: [id number] DOB:26-Aug-1970, 53 y.o., male Today's Date: 05/08/2023  END OF SESSION:  PT End of Session - 05/08/23 1426     Visit Number 13    Date for PT Re-Evaluation 05/29/23    PT Start Time 1428    PT Stop Time 1515    PT Time Calculation (min) 47 min    Activity Tolerance Patient tolerated treatment well    Behavior During Therapy WFL for tasks assessed/performed             Past Medical History:  Diagnosis Date   Allergies    Anemia    Blind left eye    CHF (congestive heart failure) (HCC)    Chronic kidney disease    ESRD - HD M,W,F   Diabetes mellitus without complication (HCC)    Type2   Dyspnea    Patient denies SOB since he started dialysis. States he went away after he started dialysis   HLD (hyperlipidemia)    Hypertension    Pneumonia    x2   Past Surgical History:  Procedure Laterality Date   A/V FISTULAGRAM Left 11/19/2022   Procedure: A/V Fistulagram;  Surgeon: Nada Libman, MD;  [facility]: [facility];  Service: Cardiovascular;  Laterality: Left;   AV FISTULA PLACEMENT Left 09/19/2022   Procedure: ARTERIOVENOUS (AV) FISTULA CREATION;  Surgeon: Nada Libman, MD;  Location: [facility] OR;  Service: Vascular;  Laterality: Left;   BIOPSY  12/05/2022   Procedure: BIOPSY;  Surgeon: Napoleon Form, MD;  Location: [facility];  Service: Gastroenterology;;   CATARACT EXTRACTION Right    COLONOSCOPY WITH PROPOFOL N/A 12/05/2022   Procedure: COLONOSCOPY WITH PROPOFOL;  Surgeon: Napoleon Form, MD;  Location: [facility];  Service: Gastroenterology;  Laterality: N/A;   ESOPHAGOGASTRODUODENOSCOPY (EGD) WITH PROPOFOL N/A 12/05/2022   Procedure: ESOPHAGOGASTRODUODENOSCOPY (EGD) WITH PROPOFOL;  Surgeon: Napoleon Form, MD;  Location: [facility];  Service: Gastroenterology;  Laterality: N/A;   FRACTURE SURGERY Left    [place]   KNEE RECONSTRUCTION Right 03/03/2023   Procedure: RIGHT KNEE  QUADRICEPS TENDON LENGTHENING AND PATELLA TENDON RECONSTRUCTION WITH ACHILLES ALLOGRAFT;  Surgeon: Huel Cote, MD;  Location: [facility];  Service: Orthopedics;  Laterality: Right;   LIGATION OF COMPETING BRANCHES OF ARTERIOVENOUS FISTULA Left 03/11/2023   Procedure: LIGATION OF COMPETING BRANCHES OF LEFT ARTERIOVENOUS FISTULA;  Surgeon: Daria Pastures, MD;  Location: [facility] OR;  Service: Vascular;  Laterality: Left;   MENISCUS REPAIR Left    PERIPHERAL VASCULAR INTERVENTION  11/19/2022   Procedure: PERIPHERAL VASCULAR INTERVENTION;  Surgeon: Nada Libman, MD;  [facility]: [facility];  Service: Cardiovascular;;  Embolization coil   POLYPECTOMY  12/05/2022   Procedure: POLYPECTOMY;  Surgeon: Napoleon Form, MD;  Location: [facility];  Service: Gastroenterology;;   Patient Active Problem List   Diagnosis Date Noted   Rupture of right patellar tendon 03/03/2023   Other disorders of phosphorus metabolism 01/04/2023   Heme positive stool 12/05/2022   Generalized abdominal discomfort 12/05/2022   Polyp of ascending colon 12/05/2022   Gastritis and gastroduodenitis 12/05/2022   Gastric polyp 12/05/2022   Foot callus 12/03/2022   Iron deficiency anemia, unspecified 11/01/2022   Angiodysplasia of stomach and duodenum with bleeding 09/06/2022   Other iron deficiency anemias 08/30/2022   Allergy, unspecified, initial encounter 08/20/2022   Anaphylactic reaction due to adverse effect of correct drug or medicament properly administered, initial encounter 08/20/2022   Anaphylactic  shock, unspecified, initial encounter 08/20/2022   Anemia in chronic kidney disease 08/20/2022   Coagulation defect, unspecified (HCC) 08/20/2022   Diarrhea, unspecified 08/20/2022   Encounter for immunization 08/20/2022   Encounter for screening for respiratory tuberculosis 08/20/2022   End stage renal disease (HCC) 08/20/2022   Fever, unspecified 08/20/2022   Nausea 08/20/2022   Pain, unspecified  08/20/2022   Pruritus, unspecified 08/20/2022   Dependence on renal dialysis (HCC) 08/16/2022   Hyperlipidemia, unspecified 08/16/2022   Long term (current) use of insulin (HCC) 08/16/2022   Secondary hyperparathyroidism of renal origin (HCC) 08/16/2022   Vitamin D deficiency, unspecified 08/16/2022   Hematemesis 08/05/2022   Erectile dysfunction 01/18/2021   Pneumonia 11/15/2020   Hemoptysis 11/15/2020   CKD (chronic kidney disease), stage IV (HCC) 11/15/2020   Positive D dimer 11/15/2020   Knee injury, right, subsequent encounter 09/07/2020   Type 2 diabetes mellitus (HCC) 08/22/2020   Acute respiratory failure with hypoxia (HCC) 06/22/2020   Multifocal pneumonia 06/22/2020   Accelerated hypertension 06/20/2019   Elevated troponin 06/19/2019   Chronic diastolic CHF (congestive heart failure) (HCC) 06/19/2019   Hypoalbuminemia 06/19/2019   Hypertensive urgency 06/19/2019   AKI (acute kidney injury) (HCC) 06/19/2019   CHF (congestive heart failure) (HCC) 06/19/2019    PCP: [name], [name], MD  REFERRING PROVIDER: Huel Cote, MD   REFERRING DIAG: [phone] (ICD-10-CM) - Rupture of right patellar tendon, initial encounter   THERAPY DIAG:  Stiffness of right knee, not elsewhere classified  Localized edema  Muscle weakness (generalized)  Unsteadiness on feet  Rationale for Evaluation and Treatment: Rehabilitation  ONSET DATE: 02/19/23, 03/03/23-right knee quadriceps tendon lengthening and patella tendon reconstruction with Achilles allograft   SUBJECTIVE:   SUBJECTIVE STATEMENT: "all right"  PERTINENT HISTORY: Per referring physician note: PMHx: anemia, Blind L eye, CHF, CKD, stage 4, DM- on Dialysis, HLD, HTN, L patellar tendon repair about 20 years ago. Right knee with a proximately retracted patella. He is not able to actively extend against gravity. Range of motion of the right knee is from approximately 20 degrees to 40 degrees. There is tenderness about the  remaining trochlear groove.  53 y.o. 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  MRI does not show significant osteoarthritis and given that I do believe he would benefit from a VY lengthening of the quadriceps with the patella tendon reconstruction with allograft Achilles.   PAIN:  Are you having pain? Yes: NPRS scale: 0/10 Pain location: R knee Pain description: ache Aggravating factors: Worst at night Relieving factors: Ice  PRECAUTIONS: Knee,   RED FLAGS: None   WEIGHT BEARING RESTRICTIONS: Yes WBAT with brace and crutches  FALLS:  Has patient fallen in last 6 months? No  LIVING ENVIRONMENT: Lives with: lives with their family Lives in: House/apartment Stairs: No Has following equipment at home: None  OCCUPATION: On disability  PLOF: Independent  PATIENT GOALS: Patient would like to recover his ability to walk around normally.  NEXT MD VISIT: about 2 weeks  OBJECTIVE:  Note: Objective measures were completed at Evaluation unless otherwise noted.  DIAGNOSTIC FINDINGS:  03/03/23 Xray (4 views right knee): Evidence of an old patella tendon avulsion with significant retraction and osteoarthritis of the patella   MRI right knee: There is full-thickness chronic patella tendon rupture without significant patellofemoral cartilage loss  COGNITION: Overall cognitive status: Within functional limits for tasks assessed     SENSATION: WFL  EDEMA:  None observed  MUSCLE LENGTH: Hamstrings: WNL  POSTURE: Stands with weight shifted to LLE  PALPATION: Patient's dressings prevent full observation of knee, gentle patellar mobs performed, no pain  LOWER EXTREMITY ROM: LLE WNL  Passive ROM Right eval Left eval RT sitting 04/22/23 AROM  Rt sitting 05/08/23  Hip flexion      Hip extension      Hip abduction      Hip adduction      Hip internal rotation      Hip external rotation      Knee flexion  45  105 115  Knee extension 0  12 12  Ankle dorsiflexion 0     Ankle plantarflexion 52     Ankle inversion      Ankle eversion       (Blank rows = not tested)  LOWER EXTREMITY MMT: LLE 5/5, R hip at least 4/5, knee- active quad set- weak, ankle WNL  LOWER EXTREMITY SPECIAL TESTS:  N/T  FUNCTIONAL TESTS:  N/T  GAIT: Distance walked: In clinic distances Assistive device utilized: None Level of assistance: Modified independence Comments: Patient reports he did not receive crutches after surgery. Later reports that his girlfriend has crutches he can adjust. He had this same surgery on LLE about 20 years ago, reports he can safely use crutches, declined to practice in the clinic.                                                                                                                              TREATMENT DATE:  05/08/23 Bike L 2.5 x 6 min GOALS LAQ RLE 3x10 RLE HS curls green 2x15 Mini squats 2x10 Wall sits 2x5 8'' Leg press 40lb 2x10 RLE SLS  04/29/23 Bike L1 x5 min LAQ 3 sets 10 hold at TKE 3 sec, PTA to inhibit hip flexor RLE SAQ RLE 2x10 Little heel elevation  RLE SLR 2x10 40lb resisted backwards walking 2x8 6in step ups x 10 each Sit to stands 3 x 10 holding blue ball   04/24/23 Nustep L 5 LE only Leg Press ( limited ROM per protocol) 20# RT leg only 10 x 2 sets . Unlocked 10x for ROM.calf raises 30# 2 sets 15 Green tband HS curl 3 sets 10 LAQ 2 sets 10 hold at TKE 3 sec, PTA to inhibit hip flexor Attempted SAQ multi various but could not get quad to fire enough to get heel off plinth STS with left leg straight out with emphasize on RT to get up and extend. 3 sets 5 4 inch step ups RLE 10 x then 6 inch 10 x Bike FULL rev 6 min L 2       04/22/23 Nustep L 5 Black Bar 20x DF and PF Leg Press ( limited ROM per protocol) 20# RT leg only 10 x 2 sets  SLS RT leg with vector swings left LE 10 x without UE - very difficult TM pull 20x  4 inch step  ups RT leg working on correct tech and quad activation 2 sets 10 ,fwd and laterally  STS on airex 10x STS with Left leg on 4 inch step 10 x Resisted TKE 2 sets 10 Mini SL squats 10 x   04/17/23 NuStep L4 x 6 min R knee PROM with end range mobs within protocol RLE quad sets x10 RLE SLR 2x10  W/ ER x10 Eccentric SAQ with half black foam roll 2x10 RLE Multi angle extension isometrics  Bike L 1 x 2 min Heel raises black bar 2x15 4in step  ups  RLE 2x5   04/15/23 NuStep L4 x 6 min 20lb resisted gait 4 way x 3 each Sit to stand form elevated surface  RLE Multi angle extension isometrics  RLE HS curls green 2x10 SLR RLE 2x10  W/ ER 2x10 Side lying hip add 2x10 RLE Prone hip Ext RLE   04/08/23 AROM sitting EOB SOC 65, after PROM per protocol AROM 85, 90 pass as per protocol Seated HS red tband 2 sets 10 RLE SLR, SLR w/ ER, Hip Add, Hip abd, Hip Ext x 10 with 2# and cued to activate and engage quad 1st. 2nd set no wt- quad lag with SLRs Alt box taps 4in 20 x 2 sets - PTA supporting RT knee Standing toe and heel raises on black bar 2x10 Resisted gait 4 ways 5 x each with brace on 30#- brace on Gait 359ft focusing on heel strike, hip and knee flex  04/03/23 R knee patellar mobs  RLE SLR, SLR w/ ER, Hip Add, Hip abd, Hip Ext x 10 for two rounds  R ankle 4 way x 10 green  R knee PROM with end range mobs within protocol Standing toe and heel raises on black bar 2x10 Alt box taps 4in Gait 348ft focusing on heel strike, hip and knee flex  03/27/23 R knee patellar mobs  RLE SLR, SLR w/ ER, Hip Add, Hip abd, Hip Ext x 10 for two rounds  R ankle 4 way x 10 green  R knee PROM with end range mobs within protocol   03/20/23 Black bar heel and toe raises 2 sets 10 with brace on Resisted gait 4 ways 5 x each with brace on 30#- brace on Standing hip cable pulleys 4 way 5# 10 x BIL- brace on Feet on ball small bridge 2 sets 10 Feet on ball small HS curl < 60 degrees 2 sets 10, the  isometric HS  Estim RUSSIAN 10 min quad set 5sec on/5 sec off STW to RT knee, quad and incision    03/18/23 Black bar heel and toe raises 2 sets 10 with brace on Resisted gait 4 ways 5 x each with brace on 20 # SLR 4 way lying 2 sets 10 Feet on ball small bridge 2 sets 10 Feet on ball small HS curl < 60 degrees 2 sets 10, the isometric HS  STW to RT knee, quad and incision ( okayed to massage scar at home ,instructed and he VU) tightness ITB and lateral quad  03/11/23 Quad set hold 3 sec 10x Quad set with min a SLR 2 sets 10 HS slide  to 30 degrees 10 x 2 sets HS press down 10x hold 3 sec Patellar mobs with good mobility and non tender Assisted supine hip ABD and ADD 2 sets 10 Ankle DF,PF,INV,Ev red tband 2 sets 10   03/06/23 Education Quad sets, ankle pumps, HS to 45 degrees, SLR with max  A Gentle patellar mobs  PATIENT EDUCATION:  Education details: POC, HEP Person educated: Patient Education method: Explanation Education comprehension: verbalized understanding and returned demonstration  HOME EXERCISE PROGRAM: [email or web address]  ASSESSMENT:  CLINICAL IMPRESSION:  Pt returns to therapy after two weeks , he reports being in the hospital in preporation for a kidney transplant. He is currently 9 week post op. HE continues to really struggles achieving enough quad activation to achieve full R knee TKE despite having the available ROM. Significant quad lag d/t weakness. Progressed to mini and wall squats. Encouragement needed due to hesitancy.  Compensatory techniques uses with squats. Pt had a very difficult time with sit to stands  OBJECTIVE IMPAIRMENTS: Abnormal gait, decreased activity tolerance, decreased balance, decreased coordination, difficulty walking, decreased ROM, decreased strength, impaired flexibility, improper body mechanics, postural dysfunction, and pain.   ACTIVITY LIMITATIONS: carrying, lifting, bending, sitting, standing, squatting, sleeping, stairs, transfers,  bathing, toileting, dressing, and locomotion level  PARTICIPATION LIMITATIONS: meal prep, cleaning, laundry, driving, shopping, and community activity  PERSONAL FACTORS: Past/current experiences are also affecting patient's functional outcome.   REHAB POTENTIAL: Good  CLINICAL DECISION MAKING: Evolving/moderate complexity  EVALUATION COMPLEXITY: Low   GOALS: Goals reviewed with patient? Yes  SHORT TERM GOALS: Target date: 03/21/23 I with initial HEP Baseline: Goal status: 03/12/23 MET  LONG TERM GOALS: Target date: 05/29/23  I with final HEP Baseline:  Goal status: INITIAL  2.  Patient will recover R knee AROM to at least 0-120 degrees Baseline: 0-45 Goal status: 04/08/23 progressing   04/22/23 12-105 AROM, Progressing 05/08/23  3.  Patient will recover R knee strength to 5/5 Baseline: Active, but weak quad contraction achieved. Goal status: 04/22/23 progressing Progressing 05/08/23  4.  Patient will perform dynamic SLS activities on RLE on unstable surface with no LOB Baseline:  Goal status: 04/22/23 progressing  5.  Patient will be able to walk x at least 1000' on all surfaces, including up and down steps, I, no gait abnormalities Baseline:  Goal status: 04/08/23 progressing and 04/22/23  6.  Patient will be able to perform dynamic, power moves such as jumping, hopping, agility ladder drills with no pain or instability in R knee. Baseline:  Goal status: INITIAL   PLAN:  PT FREQUENCY: 1-2x/week  PT DURATION: 12 weeks  PLANNED INTERVENTIONS: 97110-Therapeutic exercises, 97530- Therapeutic activity, [id number]- Neuromuscular re-education, 97535- Self Care, 16109- Manual therapy, [id number]- Gait training, 97014- Electrical stimulation (unattended), 97016- Vasopneumatic device, Patient/Family education, Balance training, Stair training, Taping, Dry Needling, Joint mobilization, Cryotherapy, and Moist heat  PLAN FOR NEXT SESSION: Proceed per protocol. TRY RUSSIAN STIM TO QUAD WITH  SAQ     Grayce Sessions, PTA 05/08/2023, 2:27 PM

## 2023-05-09 DIAGNOSIS — D689 Coagulation defect, unspecified: Secondary | ICD-10-CM | POA: Diagnosis not present

## 2023-05-09 DIAGNOSIS — R52 Pain, unspecified: Secondary | ICD-10-CM | POA: Diagnosis not present

## 2023-05-09 DIAGNOSIS — N2581 Secondary hyperparathyroidism of renal origin: Secondary | ICD-10-CM | POA: Diagnosis not present

## 2023-05-09 DIAGNOSIS — E1122 Type 2 diabetes mellitus with diabetic chronic kidney disease: Secondary | ICD-10-CM | POA: Diagnosis not present

## 2023-05-09 DIAGNOSIS — Z992 Dependence on renal dialysis: Secondary | ICD-10-CM | POA: Diagnosis not present

## 2023-05-09 DIAGNOSIS — D631 Anemia in chronic kidney disease: Secondary | ICD-10-CM | POA: Diagnosis not present

## 2023-05-09 DIAGNOSIS — N186 End stage renal disease: Secondary | ICD-10-CM | POA: Diagnosis not present

## 2023-05-12 DIAGNOSIS — Z992 Dependence on renal dialysis: Secondary | ICD-10-CM | POA: Diagnosis not present

## 2023-05-12 DIAGNOSIS — R52 Pain, unspecified: Secondary | ICD-10-CM | POA: Diagnosis not present

## 2023-05-12 DIAGNOSIS — E1122 Type 2 diabetes mellitus with diabetic chronic kidney disease: Secondary | ICD-10-CM | POA: Diagnosis not present

## 2023-05-12 DIAGNOSIS — D631 Anemia in chronic kidney disease: Secondary | ICD-10-CM | POA: Diagnosis not present

## 2023-05-12 DIAGNOSIS — N186 End stage renal disease: Secondary | ICD-10-CM | POA: Diagnosis not present

## 2023-05-12 DIAGNOSIS — D689 Coagulation defect, unspecified: Secondary | ICD-10-CM | POA: Diagnosis not present

## 2023-05-12 DIAGNOSIS — N2581 Secondary hyperparathyroidism of renal origin: Secondary | ICD-10-CM | POA: Diagnosis not present

## 2023-05-13 ENCOUNTER — Ambulatory Visit: Admitting: Physical Therapy

## 2023-05-13 ENCOUNTER — Encounter (HOSPITAL_BASED_OUTPATIENT_CLINIC_OR_DEPARTMENT_OTHER): Payer: Self-pay | Admitting: *Deleted

## 2023-05-15 ENCOUNTER — Ambulatory Visit (INDEPENDENT_AMBULATORY_CARE_PROVIDER_SITE_OTHER): Admitting: Orthopaedic Surgery

## 2023-05-15 DIAGNOSIS — S86811A Strain of other muscle(s) and tendon(s) at lower leg level, right leg, initial encounter: Secondary | ICD-10-CM

## 2023-05-15 NOTE — Progress Notes (Signed)
 Post Operative Evaluation    Procedure/Date of Surgery: Right patella tendon reconstruction 1/27  Interval History:    12 weeks status post right knee patella tendon reconstruction.  Overall he is doing extremely well.  He is working to physical therapy.  He feels much improved with excellent range of motion  PMH/PSH/Family History/Social History/Meds/Allergies:    Past Medical History:  Diagnosis Date   Allergies    Anemia    Blind left eye    CHF (congestive heart failure) (HCC)    Chronic kidney disease    ESRD - HD M,W,F   Diabetes mellitus without complication (HCC)    Type2   Dyspnea    Patient denies SOB since he started dialysis. States he went away after he started dialysis   HLD (hyperlipidemia)    Hypertension    Pneumonia    x2   Past Surgical History:  Procedure Laterality Date   A/V FISTULAGRAM Left 11/19/2022   Procedure: A/V Fistulagram;  Surgeon: Nada Libman, MD;  [facility]: [facility];  Service: Cardiovascular;  Laterality: Left;   AV FISTULA PLACEMENT Left 09/19/2022   Procedure: ARTERIOVENOUS (AV) FISTULA CREATION;  Surgeon: Nada Libman, MD;  Location: [facility] OR;  Service: Vascular;  Laterality: Left;   BIOPSY  12/05/2022   Procedure: BIOPSY;  Surgeon: Napoleon Form, MD;  Location: [facility];  Service: Gastroenterology;;   CATARACT EXTRACTION Right    COLONOSCOPY WITH PROPOFOL N/A 12/05/2022   Procedure: COLONOSCOPY WITH PROPOFOL;  Surgeon: Napoleon Form, MD;  Location: [facility];  Service: Gastroenterology;  Laterality: N/A;   ESOPHAGOGASTRODUODENOSCOPY (EGD) WITH PROPOFOL N/A 12/05/2022   Procedure: ESOPHAGOGASTRODUODENOSCOPY (EGD) WITH PROPOFOL;  Surgeon: Napoleon Form, MD;  Location: [facility];  Service: Gastroenterology;  Laterality: N/A;   FRACTURE SURGERY Left    [place]   KNEE RECONSTRUCTION Right 03/03/2023   Procedure: RIGHT KNEE QUADRICEPS TENDON LENGTHENING AND  PATELLA TENDON RECONSTRUCTION WITH ACHILLES ALLOGRAFT;  Surgeon: Huel Cote, MD;  Location: [facility];  Service: Orthopedics;  Laterality: Right;   LIGATION OF COMPETING BRANCHES OF ARTERIOVENOUS FISTULA Left 03/11/2023   Procedure: LIGATION OF COMPETING BRANCHES OF LEFT ARTERIOVENOUS FISTULA;  Surgeon: Daria Pastures, MD;  Location: [facility] OR;  Service: Vascular;  Laterality: Left;   MENISCUS REPAIR Left    PERIPHERAL VASCULAR INTERVENTION  11/19/2022   Procedure: PERIPHERAL VASCULAR INTERVENTION;  Surgeon: Nada Libman, MD;  [facility];  Service: Cardiovascular;;  Embolization coil   POLYPECTOMY  12/05/2022   Procedure: POLYPECTOMY;  Surgeon: Napoleon Form, MD;  Location: [facility];  Service: Gastroenterology;;   Social History   Socioeconomic History   Marital status: Single    Spouse name: Not on file   Number of children: 1   Years of education: Not on file   Highest education level: Not on file  Occupational History   Occupation: disabled  Tobacco Use   Smoking status: Never    Passive exposure: Never   Smokeless tobacco: Never  Vaping Use   Vaping status: Never Used  Substance and Sexual Activity   Alcohol use: Not Currently   Drug use: Not Currently   Sexual activity: Yes  Other Topics Concern   Not on file  Social History Narrative   Not on file   Social Drivers  of Health   Financial Resource Strain: Low Risk  (12/19/2022)   Overall Financial Resource Strain (CARDIA)    Difficulty of Paying Living Expenses: Not hard at all  Food Insecurity: No Food Insecurity (12/19/2022)   Hunger Vital Sign    Worried About Running Out of Food in the Last Year: Never true    Ran Out of Food in the Last Year: Never true  Transportation Needs: No Transportation Needs (12/19/2022)   PRAPARE - Administrator, Civil Service (Medical): No    Lack of Transportation (Non-Medical): No  Physical Activity: Insufficiently Active (12/19/2022)    Exercise Vital Sign    Days of Exercise per Week: 2 days    Minutes of Exercise per Session: 60 min  Stress: No Stress Concern Present (12/19/2022)   [employer] of Occupational Health - Occupational Stress Questionnaire    Feeling of Stress : Not at all  Social Connections: Moderately Integrated (12/19/2022)   Social Connection and Isolation Panel [NHANES]    Frequency of Communication with Friends and Family: More than three times a week    Frequency of Social Gatherings with Friends and Family: Once a week    Attends Religious Services: More than 4 times per year    Active Member of [employer] or Organizations: No    Attends Engineer, structural: Never    Marital Status: Living with partner   Family History  Problem Relation Age of Onset   Hypertension Mother    Diabetes Mother    Heart attack Mother    Diabetes Brother    No Known Allergies Current Outpatient Medications  Medication Sig Dispense Refill   amLODipine (NORVASC) 10 MG tablet Take 10 mg by mouth daily.     atorvastatin (LIPITOR) 40 MG tablet Take 1 tablet (40 mg total) by mouth daily. 90 tablet 1   Continuous Glucose Sensor (DEXCOM G7 SENSOR) MISC 1 Device by Does not apply route continuous. 9 each 1   Continuous Glucose Sensor (FREESTYLE LIBRE 3 SENSOR) MISC 1 each by Does not apply route See admin instructions. Replace every 14 days 6 each 1   Empagliflozin-linaGLIPtin 25-5 MG TABS Take 1 tablet by mouth daily. 90 tablet 1   furosemide (LASIX) 80 MG tablet Take 80 mg by mouth every Tuesday, Thursday, Saturday, and Sunday.     hydrALAZINE (APRESOLINE) 100 MG tablet Take 50 mg by mouth 3 (three) times daily. MAY TAKE EXTRA 50MG  FOR SBP>160     insulin glargine (LANTUS SOLOSTAR) 100 UNIT/ML Solostar Pen INJECT 5 UINTS INTO THE SKIN DAILY (Patient taking differently: Inject 8 Units into the skin at bedtime.) 15 mL 1   isosorbide mononitrate (IMDUR) 60 MG 24 hr tablet Take 60 mg by mouth daily.     labetalol  (NORMODYNE) 200 MG tablet Take 400 mg by mouth 2 (two) times daily.     oxyCODONE-acetaminophen (PERCOCET/ROXICET) 5-325 MG tablet Take 1 tablet by mouth every 4 (four) hours as needed for severe pain (pain score 7-10). 10 tablet 0   pantoprazole (PROTONIX) 40 MG tablet Take 1 tablet (40 mg total) by mouth 2 (two) times daily before a meal. 180 tablet 3   torsemide (DEMADEX) 20 MG tablet Take 20 mg by mouth 2 (two) times daily.     No current facility-administered medications for this visit.   No results found.  Review of Systems:   A ROS was performed including pertinent positives and negatives as documented in the HPI.   Musculoskeletal  Exam:    There were no vitals taken for this visit.  Right knee with active full extension with good quadriceps strength but decreased bulk.  Flexion deferred today.  Distal neurosensory exam is intact  Imaging:      I personally reviewed and interpreted the radiographs.   Assessment:   12 weeks status post right patella tendon reconstruction overall doing extremely well.  At this time I will plan to see him back in 2 months for reassessment  Plan :    -Return to clinic 2 months for reassessment      I personally saw and evaluated the patient, and participated in the management and treatment plan.  Huel Cote, MD Attending Physician, Orthopedic Surgery  This document was dictated using Dragon voice recognition software. A reasonable attempt at proof reading has been made to minimize errors.

## 2023-05-16 DIAGNOSIS — R52 Pain, unspecified: Secondary | ICD-10-CM | POA: Diagnosis not present

## 2023-05-16 DIAGNOSIS — D689 Coagulation defect, unspecified: Secondary | ICD-10-CM | POA: Diagnosis not present

## 2023-05-16 DIAGNOSIS — E1122 Type 2 diabetes mellitus with diabetic chronic kidney disease: Secondary | ICD-10-CM | POA: Diagnosis not present

## 2023-05-16 DIAGNOSIS — N186 End stage renal disease: Secondary | ICD-10-CM | POA: Diagnosis not present

## 2023-05-16 DIAGNOSIS — D631 Anemia in chronic kidney disease: Secondary | ICD-10-CM | POA: Diagnosis not present

## 2023-05-16 DIAGNOSIS — N2581 Secondary hyperparathyroidism of renal origin: Secondary | ICD-10-CM | POA: Diagnosis not present

## 2023-05-16 DIAGNOSIS — Z992 Dependence on renal dialysis: Secondary | ICD-10-CM | POA: Diagnosis not present

## 2023-05-19 DIAGNOSIS — N186 End stage renal disease: Secondary | ICD-10-CM | POA: Diagnosis not present

## 2023-05-19 DIAGNOSIS — D689 Coagulation defect, unspecified: Secondary | ICD-10-CM | POA: Diagnosis not present

## 2023-05-19 DIAGNOSIS — N2581 Secondary hyperparathyroidism of renal origin: Secondary | ICD-10-CM | POA: Diagnosis not present

## 2023-05-19 DIAGNOSIS — E1122 Type 2 diabetes mellitus with diabetic chronic kidney disease: Secondary | ICD-10-CM | POA: Diagnosis not present

## 2023-05-19 DIAGNOSIS — Z992 Dependence on renal dialysis: Secondary | ICD-10-CM | POA: Diagnosis not present

## 2023-05-19 DIAGNOSIS — R52 Pain, unspecified: Secondary | ICD-10-CM | POA: Diagnosis not present

## 2023-05-19 DIAGNOSIS — D631 Anemia in chronic kidney disease: Secondary | ICD-10-CM | POA: Diagnosis not present

## 2023-05-20 ENCOUNTER — Ambulatory Visit: Admitting: Physical Therapy

## 2023-05-20 ENCOUNTER — Encounter: Payer: Self-pay | Admitting: Physical Therapy

## 2023-05-20 DIAGNOSIS — R600 Localized edema: Secondary | ICD-10-CM

## 2023-05-20 DIAGNOSIS — R2681 Unsteadiness on feet: Secondary | ICD-10-CM

## 2023-05-20 DIAGNOSIS — M6281 Muscle weakness (generalized): Secondary | ICD-10-CM | POA: Diagnosis not present

## 2023-05-20 DIAGNOSIS — M25661 Stiffness of right knee, not elsewhere classified: Secondary | ICD-10-CM | POA: Diagnosis not present

## 2023-05-20 NOTE — Therapy (Signed)
 Patient Name: Timothy [name] MRN: [id number] DOB:[birth year], 53 y.o., male Today's Date: 05/20/2023  END OF SESSION:  PT End of Session - 05/20/23 0803     Visit Number 14    Date for PT Re-Evaluation 05/29/23    PT Start Time 0800    PT Stop Time 0845    PT Time Calculation (min) 45 min    Activity Tolerance Patient tolerated treatment well    Behavior During Therapy WFL for tasks assessed/performed             Past Medical History:  Diagnosis Date   Allergies    Anemia    Blind left eye    CHF (congestive heart failure) (HCC)    Chronic kidney disease    ESRD - HD M,W,F   Diabetes mellitus without complication (HCC)    Type2   Dyspnea    Patient denies SOB since he started dialysis. States he went away after he started dialysis   HLD (hyperlipidemia)    Hypertension    Pneumonia    x2   Past Surgical History:  Procedure Laterality Date   A/V FISTULAGRAM Left 11/19/2022   Procedure: A/V Fistulagram;  Surgeon: Nada Libman, MD;  Location: [facility];  Service: Cardiovascular;  Laterality: Left;   AV FISTULA PLACEMENT Left 09/19/2022   Procedure: ARTERIOVENOUS (AV) FISTULA CREATION;  Surgeon: Nada Libman, MD;  Location: [facility] OR;  Service: Vascular;  Laterality: Left;   BIOPSY  12/05/2022   Procedure: BIOPSY;  Surgeon: Napoleon Form, MD;  Location: [facility];  Service: Gastroenterology;;   CATARACT EXTRACTION Right    COLONOSCOPY WITH PROPOFOL N/A 12/05/2022   Procedure: COLONOSCOPY WITH PROPOFOL;  Surgeon: Napoleon Form, MD;  Location: [facility];  Service: Gastroenterology;  Laterality: N/A;   ESOPHAGOGASTRODUODENOSCOPY (EGD) WITH PROPOFOL N/A 12/05/2022   Procedure: ESOPHAGOGASTRODUODENOSCOPY (EGD) WITH PROPOFOL;  Surgeon: Napoleon Form, MD;  Location: [facility];  Service: Gastroenterology;  Laterality: N/A;   FRACTURE SURGERY Left    [place]   KNEE RECONSTRUCTION Right 03/03/2023   Procedure: RIGHT KNEE  QUADRICEPS TENDON LENGTHENING AND PATELLA TENDON RECONSTRUCTION WITH ACHILLES ALLOGRAFT;  Surgeon: Huel Cote, MD;  Location: [facility] OR;  Service: Orthopedics;  Laterality: Right;   LIGATION OF COMPETING BRANCHES OF ARTERIOVENOUS FISTULA Left 03/11/2023   Procedure: LIGATION OF COMPETING BRANCHES OF LEFT ARTERIOVENOUS FISTULA;  Surgeon: Daria Pastures, MD;  Location: [facility] OR;  Service: Vascular;  Laterality: Left;   MENISCUS REPAIR Left    PERIPHERAL VASCULAR INTERVENTION  11/19/2022   Procedure: PERIPHERAL VASCULAR INTERVENTION;  Surgeon: Nada Libman, MD;  Location: [facility];  Service: Cardiovascular;;  Embolization coil   POLYPECTOMY  12/05/2022   Procedure: POLYPECTOMY;  Surgeon: Napoleon Form, MD;  Location: [facility];  Service: Gastroenterology;;   Patient Active Problem List   Diagnosis Date Noted   Rupture of right patellar tendon 03/03/2023   Other disorders of phosphorus metabolism 01/04/2023   Heme positive stool 12/05/2022   Generalized abdominal discomfort 12/05/2022   Polyp of ascending colon 12/05/2022   Gastritis and gastroduodenitis 12/05/2022   Gastric polyp 12/05/2022   Foot callus 12/03/2022   Iron deficiency anemia, unspecified 11/01/2022   Angiodysplasia of stomach and duodenum with bleeding 09/06/2022   Other iron deficiency anemias 08/30/2022   Allergy, unspecified, initial encounter 08/20/2022   Anaphylactic reaction due to adverse effect of correct drug or medicament properly administered, initial encounter 08/20/2022   Anaphylactic  shock, unspecified, initial encounter 08/20/2022   Anemia in chronic kidney disease 08/20/2022   Coagulation defect, unspecified (HCC) 08/20/2022   Diarrhea, unspecified 08/20/2022   Encounter for immunization 08/20/2022   Encounter for screening for respiratory tuberculosis 08/20/2022   End stage renal disease (HCC) 08/20/2022   Fever, unspecified 08/20/2022   Nausea 08/20/2022   Pain, unspecified  08/20/2022   Pruritus, unspecified 08/20/2022   Dependence on renal dialysis (HCC) 08/16/2022   Hyperlipidemia, unspecified 08/16/2022   Long term (current) use of insulin (HCC) 08/16/2022   Secondary hyperparathyroidism of renal origin (HCC) 08/16/2022   Vitamin D deficiency, unspecified 08/16/2022   Hematemesis 08/05/2022   Erectile dysfunction 01/18/2021   Pneumonia 11/15/2020   Hemoptysis 11/15/2020   CKD (chronic kidney disease), stage IV (HCC) 11/15/2020   Positive D dimer 11/15/2020   Knee injury, right, subsequent encounter 09/07/2020   Type 2 diabetes mellitus (HCC) 08/22/2020   Acute respiratory failure with hypoxia (HCC) 06/22/2020   Multifocal pneumonia 06/22/2020   Accelerated hypertension 06/20/2019   Elevated troponin 06/19/2019   Chronic diastolic CHF (congestive heart failure) (HCC) 06/19/2019   Hypoalbuminemia 06/19/2019   Hypertensive urgency 06/19/2019   AKI (acute kidney injury) (HCC) 06/19/2019   CHF (congestive heart failure) (HCC) 06/19/2019    PCP: [name], [name], MD  REFERRING PROVIDER: Huel Cote, MD   REFERRING DIAG: [phone] (ICD-10-CM) - Rupture of right patellar tendon, initial encounter   THERAPY DIAG:  Stiffness of right knee, not elsewhere classified  Localized edema  Muscle weakness (generalized)  Unsteadiness on feet  Rationale for Evaluation and Treatment: Rehabilitation  ONSET DATE: 02/19/23, 03/03/23-right knee quadriceps tendon lengthening and patella tendon reconstruction with Achilles allograft   SUBJECTIVE:   SUBJECTIVE STATEMENT: "doing all right" getting better  PERTINENT HISTORY: Per referring physician note: PMHx: anemia, Blind L eye, CHF, CKD, stage 4, DM- on Dialysis, HLD, HTN, L patellar tendon repair about 20 years ago. Right knee with a proximately retracted patella. He is not able to actively extend against gravity. Range of motion of the right knee is from approximately 20 degrees to 40 degrees. There is  tenderness about the remaining trochlear groove.  53 y.o. 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  MRI does not show significant osteoarthritis and given that I do believe he would benefit from a VY lengthening of the quadriceps with the patella tendon reconstruction with allograft Achilles.   PAIN:  Are you having pain? Yes: NPRS scale: 0/10 Pain location: R knee Pain description: ache Aggravating factors: Worst at night Relieving factors: Ice  PRECAUTIONS: Knee,   RED FLAGS: None   WEIGHT BEARING RESTRICTIONS: Yes WBAT with brace and crutches  FALLS:  Has patient fallen in last 6 months? No  LIVING ENVIRONMENT: Lives with: lives with their family Lives in: House/apartment Stairs: No Has following equipment at home: None  OCCUPATION: On disability  PLOF: Independent  PATIENT GOALS: Patient would like to recover his ability to walk around normally.  NEXT MD VISIT: about 2 weeks  OBJECTIVE:  Note: Objective measures were completed at Evaluation unless otherwise noted.  DIAGNOSTIC FINDINGS:  03/03/23 Xray (4 views right knee): Evidence of an old patella tendon avulsion with significant retraction and osteoarthritis of the patella   MRI right knee: There is full-thickness chronic patella tendon rupture without significant patellofemoral cartilage loss  COGNITION: Overall cognitive status: Within functional limits for tasks assessed     SENSATION: WFL  EDEMA:  None observed  MUSCLE LENGTH: Hamstrings: WNL  POSTURE: Stands with weight shifted to LLE  PALPATION: Patient's dressings prevent full observation of knee, gentle patellar mobs performed, no pain  LOWER EXTREMITY ROM: LLE WNL  Passive ROM Right eval Left eval RT sitting 04/22/23 AROM  Rt sitting 05/08/23 Rt sitting 05/20/23  Hip flexion       Hip extension       Hip abduction       Hip adduction       Hip internal rotation        Hip external rotation       Knee flexion 45  105 115 118  Knee extension 0  12 12 12   Ankle dorsiflexion 0      Ankle plantarflexion 52      Ankle inversion       Ankle eversion        (Blank rows = not tested)  LOWER EXTREMITY MMT: LLE 5/5, R hip at least 4/5, knee- active quad set- weak, ankle WNL  LOWER EXTREMITY SPECIAL TESTS:  N/T  FUNCTIONAL TESTS:  N/T  GAIT: Distance walked: In clinic distances Assistive device utilized: None Level of assistance: Modified independence Comments: Patient reports he did not receive crutches after surgery. Later reports that his girlfriend has crutches he can adjust. He had this same surgery on LLE about 20 years ago, reports he can safely use crutches, declined to practice in the clinic.                                                                                                                              TREATMENT DATE:  05/20/23 NuStep L4 x 6 min LAQ RLE 2x15 RLE HS curls Blue  2x15 Wall sits 2x5 8'' Mini squats 2x10 Split squats 2x5 with UE assit   Lunges with assit   05/08/23 Bike L 2.5 x 6 min GOALS LAQ RLE 3x10 RLE HS curls green 2x15 Mini squats 2x10 Wall sits 2x5 8'' Leg press 40lb 2x10 RLE SLS  04/29/23 Bike L1 x5 min LAQ 3 sets 10 hold at TKE 3 sec, PTA to inhibit hip flexor RLE SAQ RLE 2x10 Little heel elevation  RLE SLR 2x10 40lb resisted backwards walking 2x8 6in step ups x 10 each Sit to stands 3 x 10 holding blue ball   04/24/23 Nustep L 5 LE only Leg Press ( limited ROM per protocol) 20# RT leg only 10 x 2 sets . Unlocked 10x for ROM.calf raises 30# 2 sets 15 Green tband HS curl 3 sets 10 LAQ 2 sets 10 hold at TKE 3 sec, PTA to inhibit hip flexor Attempted SAQ multi various but could not get quad to fire enough to get heel off plinth STS with left leg straight out with emphasize on RT to get up and extend. 3 sets 5 4 inch step ups RLE 10 x then 6 inch 10 x Bike FULL rev 6 min L  2    04/22/23 Nustep L 5 Black Bar 20x DF and PF Leg Press ( limited ROM per protocol) 20# RT leg only 10 x 2 sets  SLS RT leg with vector swings left LE 10 x without UE - very difficult TM pull 20x  4 inch step ups RT leg working on correct tech and quad activation 2 sets 10 ,fwd and laterally  STS on airex 10x STS with Left leg on 4 inch step 10 x Resisted TKE 2 sets 10 Mini SL squats 10 x   04/17/23 NuStep L4 x 6 min R knee PROM with end range mobs within protocol RLE quad sets x10 RLE SLR 2x10  W/ ER x10 Eccentric SAQ with half black foam roll 2x10 RLE Multi angle extension isometrics  Bike L 1 x 2 min Heel raises black bar 2x15 4in step  ups  RLE 2x5   04/15/23 NuStep L4 x 6 min 20lb resisted gait 4 way x 3 each Sit to stand form elevated surface  RLE Multi angle extension isometrics  RLE HS curls green 2x10 SLR RLE 2x10  W/ ER 2x10 Side lying hip add 2x10 RLE Prone hip Ext RLE   04/08/23 AROM sitting EOB SOC 65, after PROM per protocol AROM 85, 90 pass as per protocol Seated HS red tband 2 sets 10 RLE SLR, SLR w/ ER, Hip Add, Hip abd, Hip Ext x 10 with 2# and cued to activate and engage quad 1st. 2nd set no wt- quad lag with SLRs Alt box taps 4in 20 x 2 sets - PTA supporting RT knee Standing toe and heel raises on black bar 2x10 Resisted gait 4 ways 5 x each with brace on 30#- brace on Gait 317ft focusing on heel strike, hip and knee flex  04/03/23 R knee patellar mobs  RLE SLR, SLR w/ ER, Hip Add, Hip abd, Hip Ext x 10 for two rounds  R ankle 4 way x 10 green  R knee PROM with end range mobs within protocol Standing toe and heel raises on black bar 2x10 Alt box taps 4in Gait 32ft focusing on heel strike, hip and knee flex  03/27/23 R knee patellar mobs  RLE SLR, SLR w/ ER, Hip Add, Hip abd, Hip Ext x 10 for two rounds  R ankle 4 way x 10 green  R knee PROM with end range mobs within protocol   03/20/23 Black bar heel and toe raises 2 sets 10  with brace on Resisted gait 4 ways 5 x each with brace on 30#- brace on Standing hip cable pulleys 4 way 5# 10 x BIL- brace on Feet on ball small bridge 2 sets 10 Feet on ball small HS curl < 60 degrees 2 sets 10, the isometric HS  Estim RUSSIAN 10 min quad set 5sec on/5 sec off STW to RT knee, quad and incision    03/18/23 Black bar heel and toe raises 2 sets 10 with brace on Resisted gait 4 ways 5 x each with brace on 20 # SLR 4 way lying 2 sets 10 Feet on ball small bridge 2 sets 10 Feet on ball small HS curl < 60 degrees 2 sets 10, the isometric HS  STW to RT knee, quad and incision ( okayed to massage scar at home ,instructed and he VU) tightness ITB and lateral quad  03/11/23 Quad set hold 3 sec 10x Quad set with min a SLR 2 sets 10 HS slide  to 30 degrees 10  x 2 sets HS press down 10x hold 3 sec Patellar mobs with good mobility and non tender Assisted supine hip ABD and ADD 2 sets 10 Ankle DF,PF,INV,Ev red tband 2 sets 10   03/06/23 Education Quad sets, ankle pumps, HS to 45 degrees, SLR with max A Gentle patellar mobs  PATIENT EDUCATION:  Education details: POC, HEP Person educated: Patient Education method: Explanation Education comprehension: verbalized understanding and returned demonstration  HOME EXERCISE PROGRAM: [email or web address]  ASSESSMENT:  CLINICAL IMPRESSION:  He is currently 11 week post op. He continues to really struggles achieving enough quad activation to achieve full R knee TKE despite having the available ROM. Pt stated that the MD informed him that his Ext would come.  Significant quad lag d/t weakness. Progressed to split squats and small lunges, both did expose a lot of weakness bilaterally.   Compensatory techniques uses with squats and lunges.   OBJECTIVE IMPAIRMENTS: Abnormal gait, decreased activity tolerance, decreased balance, decreased coordination, difficulty walking, decreased ROM, decreased strength, impaired flexibility, improper body  mechanics, postural dysfunction, and pain.   ACTIVITY LIMITATIONS: carrying, lifting, bending, sitting, standing, squatting, sleeping, stairs, transfers, bathing, toileting, dressing, and locomotion level  PARTICIPATION LIMITATIONS: meal prep, cleaning, laundry, driving, shopping, and community activity  PERSONAL FACTORS: Past/current experiences are also affecting patient's functional outcome.   REHAB POTENTIAL: Good  CLINICAL DECISION MAKING: Evolving/moderate complexity  EVALUATION COMPLEXITY: Low   GOALS: Goals reviewed with patient? Yes  SHORT TERM GOALS: Target date: 03/21/23 I with initial HEP Baseline: Goal status: 03/12/23 MET  LONG TERM GOALS: Target date: 05/29/23  I with final HEP Baseline:  Goal status: INITIAL  2.  Patient will recover R knee AROM to at least 0-120 degrees Baseline: 0-45 Goal status: 04/08/23 progressing   04/22/23 12-105 AROM, Progressing 05/08/23  3.  Patient will recover R knee strength to 5/5 Baseline: Active, but weak quad contraction achieved. Goal status: 04/22/23 progressing Progressing 05/08/23  4.  Patient will perform dynamic SLS activities on RLE on unstable surface with no LOB Baseline:  Goal status: 04/22/23 progressing  5.  Patient will be able to walk x at least 1000' on all surfaces, including up and down steps, I, no gait abnormalities Baseline:  Goal status: 04/08/23 progressing and 04/22/23  6.  Patient will be able to perform dynamic, power moves such as jumping, hopping, agility ladder drills with no pain or instability in R knee. Baseline:  Goal status: INITIAL   PLAN:  PT FREQUENCY: 1-2x/week  PT DURATION: 12 weeks  PLANNED INTERVENTIONS: 97110-Therapeutic exercises, 97530- Therapeutic activity, [id number]- Neuromuscular re-education, 97535- Self Care, [id number]- Manual therapy, [id number]- Gait training, 97014- Electrical stimulation (unattended), [id number]- Vasopneumatic device, Patient/Family education, Balance training, Stair training,  Taping, Dry Needling, Joint mobilization, Cryotherapy, and Moist heat  PLAN FOR NEXT SESSION: Proceed per protocol.     Ollen Beverage, PTA 05/20/2023, 8:03 AM

## 2023-05-21 DIAGNOSIS — D689 Coagulation defect, unspecified: Secondary | ICD-10-CM | POA: Diagnosis not present

## 2023-05-21 DIAGNOSIS — E1122 Type 2 diabetes mellitus with diabetic chronic kidney disease: Secondary | ICD-10-CM | POA: Diagnosis not present

## 2023-05-21 DIAGNOSIS — N186 End stage renal disease: Secondary | ICD-10-CM | POA: Diagnosis not present

## 2023-05-21 DIAGNOSIS — R52 Pain, unspecified: Secondary | ICD-10-CM | POA: Diagnosis not present

## 2023-05-21 DIAGNOSIS — Z992 Dependence on renal dialysis: Secondary | ICD-10-CM | POA: Diagnosis not present

## 2023-05-21 DIAGNOSIS — D631 Anemia in chronic kidney disease: Secondary | ICD-10-CM | POA: Diagnosis not present

## 2023-05-21 DIAGNOSIS — N2581 Secondary hyperparathyroidism of renal origin: Secondary | ICD-10-CM | POA: Diagnosis not present

## 2023-05-22 ENCOUNTER — Ambulatory Visit: Admitting: Physical Therapy

## 2023-05-23 DIAGNOSIS — D631 Anemia in chronic kidney disease: Secondary | ICD-10-CM | POA: Diagnosis not present

## 2023-05-23 DIAGNOSIS — N186 End stage renal disease: Secondary | ICD-10-CM | POA: Diagnosis not present

## 2023-05-23 DIAGNOSIS — D689 Coagulation defect, unspecified: Secondary | ICD-10-CM | POA: Diagnosis not present

## 2023-05-23 DIAGNOSIS — R52 Pain, unspecified: Secondary | ICD-10-CM | POA: Diagnosis not present

## 2023-05-23 DIAGNOSIS — N2581 Secondary hyperparathyroidism of renal origin: Secondary | ICD-10-CM | POA: Diagnosis not present

## 2023-05-23 DIAGNOSIS — Z992 Dependence on renal dialysis: Secondary | ICD-10-CM | POA: Diagnosis not present

## 2023-05-23 DIAGNOSIS — E1122 Type 2 diabetes mellitus with diabetic chronic kidney disease: Secondary | ICD-10-CM | POA: Diagnosis not present

## 2023-05-26 DIAGNOSIS — D689 Coagulation defect, unspecified: Secondary | ICD-10-CM | POA: Diagnosis not present

## 2023-05-26 DIAGNOSIS — N2581 Secondary hyperparathyroidism of renal origin: Secondary | ICD-10-CM | POA: Diagnosis not present

## 2023-05-26 DIAGNOSIS — N186 End stage renal disease: Secondary | ICD-10-CM | POA: Diagnosis not present

## 2023-05-26 DIAGNOSIS — E1122 Type 2 diabetes mellitus with diabetic chronic kidney disease: Secondary | ICD-10-CM | POA: Diagnosis not present

## 2023-05-26 DIAGNOSIS — D631 Anemia in chronic kidney disease: Secondary | ICD-10-CM | POA: Diagnosis not present

## 2023-05-26 DIAGNOSIS — R52 Pain, unspecified: Secondary | ICD-10-CM | POA: Diagnosis not present

## 2023-05-26 DIAGNOSIS — Z992 Dependence on renal dialysis: Secondary | ICD-10-CM | POA: Diagnosis not present

## 2023-05-27 ENCOUNTER — Ambulatory Visit: Admitting: Physical Therapy

## 2023-05-27 ENCOUNTER — Encounter: Payer: Self-pay | Admitting: Physical Therapy

## 2023-05-27 DIAGNOSIS — M25661 Stiffness of right knee, not elsewhere classified: Secondary | ICD-10-CM

## 2023-05-27 DIAGNOSIS — R2681 Unsteadiness on feet: Secondary | ICD-10-CM

## 2023-05-27 DIAGNOSIS — R600 Localized edema: Secondary | ICD-10-CM | POA: Diagnosis not present

## 2023-05-27 DIAGNOSIS — M6281 Muscle weakness (generalized): Secondary | ICD-10-CM

## 2023-05-27 NOTE — Therapy (Signed)
 Patient Name: Timothy [name] MRN: [id number] DOB:10-Aug-1970, 53 y.o., male Today's Date: 05/27/2023  END OF SESSION:  PT End of Session - 05/27/23 0758     Visit Number 15    Date for PT Re-Evaluation 05/29/23    PT Start Time 0800    PT Stop Time 0845    PT Time Calculation (min) 45 min             Past Medical History:  Diagnosis Date   Allergies    Anemia    Blind left eye    CHF (congestive heart failure) (HCC)    Chronic kidney disease    ESRD - HD M,W,F   Diabetes mellitus without complication (HCC)    Type2   Dyspnea    Patient denies SOB since he started dialysis. States he went away after he started dialysis   HLD (hyperlipidemia)    Hypertension    Pneumonia    x2   Past Surgical History:  Procedure Laterality Date   A/V FISTULAGRAM Left 11/19/2022   Procedure: A/V Fistulagram;  Surgeon: Margherita Shell, MD;  Location: [facility];  Service: Cardiovascular;  Laterality: Left;   AV FISTULA PLACEMENT Left 09/19/2022   Procedure: ARTERIOVENOUS (AV) FISTULA CREATION;  Surgeon: Margherita Shell, MD;  Location: [facility];  Service: Vascular;  Laterality: Left;   BIOPSY  12/05/2022   Procedure: BIOPSY;  Surgeon: Sergio Dandy, MD;  Location: [facility];  Service: Gastroenterology;;   CATARACT EXTRACTION Right    COLONOSCOPY WITH PROPOFOL  N/A 12/05/2022   Procedure: COLONOSCOPY WITH PROPOFOL ;  Surgeon: Sergio Dandy, MD;  Location: [facility];  Service: Gastroenterology;  Laterality: N/A;   ESOPHAGOGASTRODUODENOSCOPY (EGD) WITH PROPOFOL  N/A 12/05/2022   Procedure: ESOPHAGOGASTRODUODENOSCOPY (EGD) WITH PROPOFOL ;  Surgeon: Sergio Dandy, MD;  Location: [facility];  Service: Gastroenterology;  Laterality: N/A;   FRACTURE SURGERY Left    [place]   KNEE RECONSTRUCTION Right 03/03/2023   Procedure: RIGHT KNEE QUADRICEPS TENDON LENGTHENING AND PATELLA TENDON RECONSTRUCTION WITH ACHILLES ALLOGRAFT;  Surgeon: Wilhelmenia Harada, MD;   Location: [facility];  Service: Orthopedics;  Laterality: Right;   LIGATION OF COMPETING BRANCHES OF ARTERIOVENOUS FISTULA Left 03/11/2023   Procedure: LIGATION OF COMPETING BRANCHES OF LEFT ARTERIOVENOUS FISTULA;  Surgeon: Philipp Brawn, MD;  Location: [facility];  Service: Vascular;  Laterality: Left;   MENISCUS REPAIR Left    PERIPHERAL VASCULAR INTERVENTION  11/19/2022   Procedure: PERIPHERAL VASCULAR INTERVENTION;  Surgeon: Margherita Shell, MD;  Location: [facility];  Service: Cardiovascular;;  Embolization coil   POLYPECTOMY  12/05/2022   Procedure: POLYPECTOMY;  Surgeon: Sergio Dandy, MD;  Location: [facility];  Service: Gastroenterology;;   Patient Active Problem List   Diagnosis Date Noted   Rupture of right patellar tendon 03/03/2023   Other disorders of phosphorus metabolism 01/04/2023   Heme positive stool 12/05/2022   Generalized abdominal discomfort 12/05/2022   Polyp of ascending colon 12/05/2022   Gastritis and gastroduodenitis 12/05/2022   Gastric polyp 12/05/2022   Foot callus 12/03/2022   Iron deficiency anemia, unspecified 11/01/2022   Angiodysplasia of stomach and duodenum with bleeding 09/06/2022   Other iron deficiency anemias 08/30/2022   Allergy, unspecified, initial encounter 08/20/2022   Anaphylactic reaction due to adverse effect of correct drug or medicament properly administered, initial encounter 08/20/2022   Anaphylactic shock, unspecified, initial encounter 08/20/2022   Anemia in chronic kidney disease 08/20/2022   Coagulation defect, unspecified (HCC)  08/20/2022   Diarrhea, unspecified 08/20/2022   Encounter for immunization 08/20/2022   Encounter for screening for respiratory tuberculosis 08/20/2022   End stage renal disease (HCC) 08/20/2022   Fever, unspecified 08/20/2022   Nausea 08/20/2022   Pain, unspecified 08/20/2022   Pruritus, unspecified 08/20/2022   Dependence on renal dialysis (HCC) 08/16/2022   Hyperlipidemia, unspecified  08/16/2022   Long term (current) use of insulin  (HCC) 08/16/2022   Secondary hyperparathyroidism of renal origin (HCC) 08/16/2022   Vitamin D deficiency, unspecified 08/16/2022   Hematemesis 08/05/2022   Erectile dysfunction 01/18/2021   Pneumonia 11/15/2020   Hemoptysis 11/15/2020   CKD (chronic kidney disease), stage IV (HCC) 11/15/2020   Positive D dimer 11/15/2020   Knee injury, right, subsequent encounter 09/07/2020   Type 2 diabetes mellitus (HCC) 08/22/2020   Acute respiratory failure with hypoxia (HCC) 06/22/2020   Multifocal pneumonia 06/22/2020   Accelerated hypertension 06/20/2019   Elevated troponin 06/19/2019   Chronic diastolic CHF (congestive heart failure) (HCC) 06/19/2019   Hypoalbuminemia 06/19/2019   Hypertensive urgency 06/19/2019   AKI (acute kidney injury) (HCC) 06/19/2019   CHF (congestive heart failure) (HCC) 06/19/2019    PCP: [name], [name], MD  REFERRING PROVIDER: Wilhelmenia Harada, MD   REFERRING DIAG: [phone] (ICD-10-CM) - Rupture of right patellar tendon, initial encounter   THERAPY DIAG:  Stiffness of right knee, not elsewhere classified  Muscle weakness (generalized)  Unsteadiness on feet  Rationale for Evaluation and Treatment: Rehabilitation  ONSET DATE: 02/19/23, 03/03/23-right knee quadriceps tendon lengthening and patella tendon reconstruction with Achilles allograft   SUBJECTIVE:   SUBJECTIVE STATEMENT: "doing all right". He is doing his HEP and said  it is helping him to walk better.  PERTINENT HISTORY: Per referring physician note: PMHx: anemia, Blind L eye, CHF, CKD, stage 4, DM- on Dialysis, HLD, HTN, L patellar tendon repair about 20 years ago. Right knee with a proximately retracted patella. He is not able to actively extend against gravity. Range of motion of the right knee is from approximately 20 degrees to 40 degrees. There is tenderness about the remaining trochlear groove.  53 y.o. 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  MRI does not show significant osteoarthritis and given that I do believe he would benefit from a VY lengthening of the quadriceps with the patella tendon reconstruction with allograft Achilles.   PAIN:  Are you having pain? Yes: NPRS scale: 0/10 Pain location: R knee Pain description: ache Aggravating factors: Worst at night Relieving factors: Ice  PRECAUTIONS: Knee,   RED FLAGS: None   WEIGHT BEARING RESTRICTIONS: Yes WBAT with brace and crutches  FALLS:  Has patient fallen in last 6 months? No  LIVING ENVIRONMENT: Lives with: lives with their family Lives in: House/apartment Stairs: No Has following equipment at home: None  OCCUPATION: On disability  PLOF: Independent  PATIENT GOALS: Patient would like to recover his ability to walk around normally.  NEXT MD VISIT: about 2 weeks  OBJECTIVE:  Note: Objective measures were completed at Evaluation unless otherwise noted.  DIAGNOSTIC FINDINGS:  03/03/23 Xray (4 views right knee): Evidence of an old patella tendon avulsion with significant retraction and osteoarthritis of the patella   MRI right knee: There is full-thickness chronic patella tendon rupture without significant patellofemoral cartilage loss  COGNITION: Overall cognitive status: Within functional limits for tasks assessed     SENSATION: WFL  EDEMA:  None observed  MUSCLE LENGTH: Hamstrings: WNL  POSTURE:  Stands with weight shifted to LLE  PALPATION: Patient's dressings prevent full observation of knee, gentle patellar mobs performed, no pain  LOWER EXTREMITY ROM: LLE WNL  Passive ROM Right eval Left eval RT sitting 04/22/23 AROM  Rt sitting 05/08/23 Rt sitting 05/20/23  Hip flexion       Hip extension       Hip abduction       Hip adduction       Hip internal rotation       Hip external rotation       Knee flexion 45  105 115 118  Knee extension 0  12 12 12    Ankle dorsiflexion 0      Ankle plantarflexion 52      Ankle inversion       Ankle eversion        (Blank rows = not tested)  LOWER EXTREMITY MMT: LLE 5/5, R hip at least 4/5, knee- active quad set- weak, ankle WNL  LOWER EXTREMITY SPECIAL TESTS:  N/T  FUNCTIONAL TESTS:  N/T  GAIT: Distance walked: In clinic distances Assistive device utilized: None Level of assistance: Modified independence Comments: Patient reports he did not receive crutches after surgery. Later reports that his girlfriend has crutches he can adjust. He had this same surgery on LLE about 20 years ago, reports he can safely use crutches, declined to practice in the clinic.                                                                                                                              TREATMENT DATE:  05/27/23 Nustep L6 LAQ RLE 2#x15, 2x10 no weight- emphasis on eccentric RLE HS curl blue 2x12  S2S On airex 2x10 Wall sit x8 10 sec hold RLE step down- attemped but stopped because c/o pain, had hip compensation, and had instability.  4in side step up 2x10 lead with rt Rt leg on 6in box, Lt leg on airex, head swivel Split squat on airex 2x8    05/20/23 NuStep L4 x 6 min LAQ RLE 2x15 RLE HS curls Blue  2x15 Wall sits 2x5 8'' Mini squats 2x10 Split squats 2x5 with UE assit   Lunges with assit   05/08/23 Bike L 2.5 x 6 min GOALS LAQ RLE 3x10 RLE HS curls green 2x15 Mini squats 2x10 Wall sits 2x5 8'' Leg press 40lb 2x10 RLE SLS  04/29/23 Bike L1 x5 min LAQ 3 sets 10 hold at TKE 3 sec, PTA to inhibit hip flexor RLE SAQ RLE 2x10 Little heel elevation  RLE SLR 2x10 40lb resisted backwards walking 2x8 6in step ups x 10 each Sit to stands 3 x 10 holding blue ball   04/24/23 Nustep L 5 LE only Leg Press ( limited ROM per protocol) 20# RT leg only 10 x 2 sets . Unlocked 10x for ROM.calf raises 30# 2 sets 15 Green tband HS curl 3 sets 10 LAQ 2 sets 10  hold at [facility] 3 sec, PTA to  inhibit hip flexor Attempted SAQ multi various but could not get quad to fire enough to get heel off plinth STS with left leg straight out with emphasize on RT to get up and extend. 3 sets 5 4 inch step ups RLE 10 x then 6 inch 10 x Bike FULL rev 6 min L 2    04/22/23 Nustep L 5 Black Bar 20x DF and PF Leg Press ( limited ROM per protocol) 20# RT leg only 10 x 2 sets  SLS RT leg with vector swings left LE 10 x without UE - very difficult TM pull 20x  4 inch step ups RT leg working on correct tech and quad activation 2 sets 10 ,fwd and laterally  STS on airex 10x STS with Left leg on 4 inch step 10 x Resisted TKE 2 sets 10 Mini SL squats 10 x   04/17/23 NuStep L4 x 6 min R knee PROM with end range mobs within protocol RLE quad sets x10 RLE SLR 2x10  W/ ER x10 Eccentric SAQ with half black foam roll 2x10 RLE Multi angle extension isometrics  Bike L 1 x 2 min Heel raises black bar 2x15 4in step  ups  RLE 2x5   04/15/23 NuStep L4 x 6 min 20lb resisted gait 4 way x 3 each Sit to stand form elevated surface  RLE Multi angle extension isometrics  RLE HS curls green 2x10 SLR RLE 2x10  W/ ER 2x10 Side lying hip add 2x10 RLE Prone hip Ext RLE   04/08/23 AROM sitting EOB SOC 65, after PROM per protocol AROM 85, 90 pass as per protocol Seated HS red tband 2 sets 10 RLE SLR, SLR w/ ER, Hip Add, Hip abd, Hip Ext x 10 with 2# and cued to activate and engage quad 1st. 2nd set no wt- quad lag with SLRs Alt box taps 4in 20 x 2 sets - PTA supporting RT knee Standing toe and heel raises on black bar 2x10 Resisted gait 4 ways 5 x each with brace on 30#- brace on Gait 372ft focusing on heel strike, hip and knee flex  04/03/23 R knee patellar mobs  RLE SLR, SLR w/ ER, Hip Add, Hip abd, Hip Ext x 10 for two rounds  R ankle 4 way x 10 green  R knee PROM with end range mobs within protocol Standing toe and heel raises on black bar 2x10 Alt box taps 4in Gait 382ft focusing on heel  strike, hip and knee flex  03/27/23 R knee patellar mobs  RLE SLR, SLR w/ ER, Hip Add, Hip abd, Hip Ext x 10 for two rounds  R ankle 4 way x 10 green  R knee PROM with end range mobs within protocol   03/20/23 Black bar heel and toe raises 2 sets 10 with brace on Resisted gait 4 ways 5 x each with brace on 30#- brace on Standing hip cable pulleys 4 way 5# 10 x BIL- brace on Feet on ball small bridge 2 sets 10 Feet on ball small HS curl < 60 degrees 2 sets 10, the isometric HS  Estim RUSSIAN 10 min quad set 5sec on/5 sec off STW to RT knee, quad and incision    03/18/23 Black bar heel and toe raises 2 sets 10 with brace on Resisted gait 4 ways 5 x each with brace on 20 # SLR 4 way lying 2 sets 10 Feet on ball small bridge 2 sets  10 Feet on ball small HS curl < 60 degrees 2 sets 10, the isometric HS  STW to RT knee, quad and incision ( okayed to massage scar at home ,instructed and he VU) tightness ITB and lateral quad  03/11/23 Quad set hold 3 sec 10x Quad set with min a SLR 2 sets 10 HS slide  to 30 degrees 10 x 2 sets HS press down 10x hold 3 sec Patellar mobs with good mobility and non tender Assisted supine hip ABD and ADD 2 sets 10 Ankle DF,PF,INV,Ev red tband 2 sets 10   03/06/23 Education Quad sets, ankle pumps, HS to 45 degrees, SLR with max A Gentle patellar mobs  PATIENT EDUCATION:  Education details: POC, HEP Person educated: Patient Education method: Explanation Education comprehension: verbalized understanding and returned demonstration  HOME EXERCISE PROGRAM: [email or web address]  ASSESSMENT:  CLINICAL IMPRESSION:  He arrived with antalgic gait, however he reported no pain. With LAQ her was lacking some ext., so Emphasis was on eccentric. He was able to squat t 90*, but lacked endurance as demonstrated by his 10 sec wall sit.  When performing isolated step donw on Rt leg, he had instability, complained or pain, and was unable to do it without compensation. When doing  a functional side step up leading with the Rt, he was able to do it with no cuing. He is demonstrating nervousness when using his Rt leg. Split squats and airex activities were focused on building confidence and stability in RLE.  OBJECTIVE IMPAIRMENTS: Abnormal gait, decreased activity tolerance, decreased balance, decreased coordination, difficulty walking, decreased ROM, decreased strength, impaired flexibility, improper body mechanics, postural dysfunction, and pain.   ACTIVITY LIMITATIONS: carrying, lifting, bending, sitting, standing, squatting, sleeping, stairs, transfers, bathing, toileting, dressing, and locomotion level  PARTICIPATION LIMITATIONS: meal prep, cleaning, laundry, driving, shopping, and community activity  PERSONAL FACTORS: Past/current experiences are also affecting patient's functional outcome.   REHAB POTENTIAL: Good  CLINICAL DECISION MAKING: Evolving/moderate complexity  EVALUATION COMPLEXITY: Low   GOALS: Goals reviewed with patient? Yes  SHORT TERM GOALS: Target date: 03/21/23 I with initial HEP Baseline: Goal status: 03/12/23 MET  LONG TERM GOALS: Target date: 05/29/23  I with final HEP Baseline:  Goal status: INITIAL  2.  Patient will recover R knee AROM to at least 0-120 degrees Baseline: 0-45 Goal status: 04/08/23 progressing   04/22/23 12-105 AROM, Progressing 05/08/23  3.  Patient will recover R knee strength to 5/5 Baseline: Active, but weak quad contraction achieved. Goal status: 04/22/23 progressing Progressing 05/08/23  4.  Patient will perform dynamic SLS activities on RLE on unstable surface with no LOB Baseline:  Goal status: 04/22/23 progressing  5.  Patient will be able to walk x at least 1000' on all surfaces, including up and down steps, I, no gait abnormalities Baseline:  Goal status: 04/08/23 progressing and 04/22/23  6.  Patient will be able to perform dynamic, power moves such as jumping, hopping, agility ladder drills with no pain or  instability in R knee. Baseline:  Goal status: INITIAL   PLAN:  PT FREQUENCY: 1-2x/week  PT DURATION: 12 weeks  PLANNED INTERVENTIONS: 97110-Therapeutic exercises, 97530- Therapeutic activity, [id number]- Neuromuscular re-education, 97535- Self Care, [id number]- Manual therapy, [id number]- Gait training, 97014- Electrical stimulation (unattended), 97016- Vasopneumatic device, Patient/Family education, Balance training, Stair training, Taping, Dry Needling, Joint mobilization, Cryotherapy, and Moist heat  PLAN FOR NEXT SESSION: Proceed per protocol.     Laurelyn Ponder 05/27/2023, 7:59 AM

## 2023-05-28 DIAGNOSIS — D631 Anemia in chronic kidney disease: Secondary | ICD-10-CM | POA: Diagnosis not present

## 2023-05-28 DIAGNOSIS — N186 End stage renal disease: Secondary | ICD-10-CM | POA: Diagnosis not present

## 2023-05-28 DIAGNOSIS — N2581 Secondary hyperparathyroidism of renal origin: Secondary | ICD-10-CM | POA: Diagnosis not present

## 2023-05-28 DIAGNOSIS — D689 Coagulation defect, unspecified: Secondary | ICD-10-CM | POA: Diagnosis not present

## 2023-05-28 DIAGNOSIS — E1122 Type 2 diabetes mellitus with diabetic chronic kidney disease: Secondary | ICD-10-CM | POA: Diagnosis not present

## 2023-05-28 DIAGNOSIS — R52 Pain, unspecified: Secondary | ICD-10-CM | POA: Diagnosis not present

## 2023-05-28 DIAGNOSIS — Z992 Dependence on renal dialysis: Secondary | ICD-10-CM | POA: Diagnosis not present

## 2023-05-29 ENCOUNTER — Ambulatory Visit: Admitting: Physical Therapy

## 2023-05-30 DIAGNOSIS — D689 Coagulation defect, unspecified: Secondary | ICD-10-CM | POA: Diagnosis not present

## 2023-05-30 DIAGNOSIS — Z992 Dependence on renal dialysis: Secondary | ICD-10-CM | POA: Diagnosis not present

## 2023-05-30 DIAGNOSIS — D631 Anemia in chronic kidney disease: Secondary | ICD-10-CM | POA: Diagnosis not present

## 2023-05-30 DIAGNOSIS — R52 Pain, unspecified: Secondary | ICD-10-CM | POA: Diagnosis not present

## 2023-05-30 DIAGNOSIS — N186 End stage renal disease: Secondary | ICD-10-CM | POA: Diagnosis not present

## 2023-05-30 DIAGNOSIS — E1122 Type 2 diabetes mellitus with diabetic chronic kidney disease: Secondary | ICD-10-CM | POA: Diagnosis not present

## 2023-05-30 DIAGNOSIS — N2581 Secondary hyperparathyroidism of renal origin: Secondary | ICD-10-CM | POA: Diagnosis not present

## 2023-06-02 DIAGNOSIS — N186 End stage renal disease: Secondary | ICD-10-CM | POA: Diagnosis not present

## 2023-06-02 DIAGNOSIS — E1122 Type 2 diabetes mellitus with diabetic chronic kidney disease: Secondary | ICD-10-CM | POA: Diagnosis not present

## 2023-06-02 DIAGNOSIS — Z992 Dependence on renal dialysis: Secondary | ICD-10-CM | POA: Diagnosis not present

## 2023-06-02 DIAGNOSIS — D631 Anemia in chronic kidney disease: Secondary | ICD-10-CM | POA: Diagnosis not present

## 2023-06-02 DIAGNOSIS — N2581 Secondary hyperparathyroidism of renal origin: Secondary | ICD-10-CM | POA: Diagnosis not present

## 2023-06-02 DIAGNOSIS — R52 Pain, unspecified: Secondary | ICD-10-CM | POA: Diagnosis not present

## 2023-06-02 DIAGNOSIS — D689 Coagulation defect, unspecified: Secondary | ICD-10-CM | POA: Diagnosis not present

## 2023-06-03 ENCOUNTER — Ambulatory Visit: Admitting: Physical Therapy

## 2023-06-03 ENCOUNTER — Encounter: Payer: Self-pay | Admitting: Physical Therapy

## 2023-06-03 DIAGNOSIS — R2681 Unsteadiness on feet: Secondary | ICD-10-CM | POA: Diagnosis not present

## 2023-06-03 DIAGNOSIS — R600 Localized edema: Secondary | ICD-10-CM | POA: Diagnosis not present

## 2023-06-03 DIAGNOSIS — M6281 Muscle weakness (generalized): Secondary | ICD-10-CM

## 2023-06-03 DIAGNOSIS — M25661 Stiffness of right knee, not elsewhere classified: Secondary | ICD-10-CM | POA: Diagnosis not present

## 2023-06-03 NOTE — Therapy (Signed)
 Patient Name: Timothy [name] MRN: [id number] DOB:11/07/1970, 53 y.o., male Today's Date: 06/03/2023  END OF SESSION:  PT End of Session - 06/03/23 0806     Visit Number 16    Date for PT Re-Evaluation 05/29/23    PT Start Time 0800    PT Stop Time 0845    PT Time Calculation (min) 45 min             Past Medical History:  Diagnosis Date   Allergies    Anemia    Blind left eye    CHF (congestive heart failure) (HCC)    Chronic kidney disease    ESRD - HD M,W,F   Diabetes mellitus without complication (HCC)    Type2   Dyspnea    Patient denies SOB since he started dialysis. States he went away after he started dialysis   HLD (hyperlipidemia)    Hypertension    Pneumonia    x2   Past Surgical History:  Procedure Laterality Date   A/V FISTULAGRAM Left 11/19/2022   Procedure: A/V Fistulagram;  Surgeon: Margherita Shell, MD;  Location: [facility];  Service: Cardiovascular;  Laterality: Left;   AV FISTULA PLACEMENT Left 09/19/2022   Procedure: ARTERIOVENOUS (AV) FISTULA CREATION;  Surgeon: Margherita Shell, MD;  Location: [facility] OR;  Service: Vascular;  Laterality: Left;   BIOPSY  12/05/2022   Procedure: BIOPSY;  Surgeon: Sergio Dandy, MD;  Location: [facility];  Service: Gastroenterology;;   CATARACT EXTRACTION Right    COLONOSCOPY WITH PROPOFOL  N/A 12/05/2022   Procedure: COLONOSCOPY WITH PROPOFOL ;  Surgeon: Sergio Dandy, MD;  Location: [facility];  Service: Gastroenterology;  Laterality: N/A;   ESOPHAGOGASTRODUODENOSCOPY (EGD) WITH PROPOFOL  N/A 12/05/2022   Procedure: ESOPHAGOGASTRODUODENOSCOPY (EGD) WITH PROPOFOL ;  Surgeon: Sergio Dandy, MD;  Location: [facility];  Service: Gastroenterology;  Laterality: N/A;   FRACTURE SURGERY Left    [place]   KNEE RECONSTRUCTION Right 03/03/2023   Procedure: RIGHT KNEE QUADRICEPS TENDON LENGTHENING AND PATELLA TENDON RECONSTRUCTION WITH ACHILLES ALLOGRAFT;  Surgeon: Wilhelmenia Harada, MD;   Location: [facility] OR;  Service: Orthopedics;  Laterality: Right;   LIGATION OF COMPETING BRANCHES OF ARTERIOVENOUS FISTULA Left 03/11/2023   Procedure: LIGATION OF COMPETING BRANCHES OF LEFT ARTERIOVENOUS FISTULA;  Surgeon: Philipp Brawn, MD;  Location: [facility] OR;  Service: Vascular;  Laterality: Left;   MENISCUS REPAIR Left    PERIPHERAL VASCULAR INTERVENTION  11/19/2022   Procedure: PERIPHERAL VASCULAR INTERVENTION;  Surgeon: Margherita Shell, MD;  Location: [facility];  Service: Cardiovascular;;  Embolization coil   POLYPECTOMY  12/05/2022   Procedure: POLYPECTOMY;  Surgeon: Sergio Dandy, MD;  Location: [facility];  Service: Gastroenterology;;   Patient Active Problem List   Diagnosis Date Noted   Rupture of right patellar tendon 03/03/2023   Other disorders of phosphorus metabolism 01/04/2023   Heme positive stool 12/05/2022   Generalized abdominal discomfort 12/05/2022   Polyp of ascending colon 12/05/2022   Gastritis and gastroduodenitis 12/05/2022   Gastric polyp 12/05/2022   Foot callus 12/03/2022   Iron deficiency anemia, unspecified 11/01/2022   Angiodysplasia of stomach and duodenum with bleeding 09/06/2022   Other iron deficiency anemias 08/30/2022   Allergy, unspecified, initial encounter 08/20/2022   Anaphylactic reaction due to adverse effect of correct drug or medicament properly administered, initial encounter 08/20/2022   Anaphylactic shock, unspecified, initial encounter 08/20/2022   Anemia in chronic kidney disease 08/20/2022   Coagulation defect, unspecified (HCC)  08/20/2022   Diarrhea, unspecified 08/20/2022   Encounter for immunization 08/20/2022   Encounter for screening for respiratory tuberculosis 08/20/2022   End stage renal disease (HCC) 08/20/2022   Fever, unspecified 08/20/2022   Nausea 08/20/2022   Pain, unspecified 08/20/2022   Pruritus, unspecified 08/20/2022   Dependence on renal dialysis (HCC) 08/16/2022   Hyperlipidemia, unspecified  08/16/2022   Long term (current) use of insulin  (HCC) 08/16/2022   Secondary hyperparathyroidism of renal origin (HCC) 08/16/2022   Vitamin D deficiency, unspecified 08/16/2022   Hematemesis 08/05/2022   Erectile dysfunction 01/18/2021   Pneumonia 11/15/2020   Hemoptysis 11/15/2020   CKD (chronic kidney disease), stage IV (HCC) 11/15/2020   Positive D dimer 11/15/2020   Knee injury, right, subsequent encounter 09/07/2020   Type 2 diabetes mellitus (HCC) 08/22/2020   Acute respiratory failure with hypoxia (HCC) 06/22/2020   Multifocal pneumonia 06/22/2020   Accelerated hypertension 06/20/2019   Elevated troponin 06/19/2019   Chronic diastolic CHF (congestive heart failure) (HCC) 06/19/2019   Hypoalbuminemia 06/19/2019   Hypertensive urgency 06/19/2019   AKI (acute kidney injury) (HCC) 06/19/2019   CHF (congestive heart failure) (HCC) 06/19/2019    PCP: [name], [name], MD  REFERRING PROVIDER: Wilhelmenia Harada, MD   REFERRING DIAG: [phone] (ICD-10-CM) - Rupture of right patellar tendon, initial encounter   THERAPY DIAG:  Stiffness of right knee, not elsewhere classified  Muscle weakness (generalized)  Unsteadiness on feet  Localized edema  Rationale for Evaluation and Treatment: Rehabilitation  ONSET DATE: 02/19/23, 03/03/23-right knee quadriceps tendon lengthening and patella tendon reconstruction with Achilles allograft   SUBJECTIVE:   SUBJECTIVE STATEMENT: "doing all right". He is doing his HEP and is gaining confidence in his knee to use the stairs.   PERTINENT HISTORY: Per referring physician note: PMHx: anemia, Blind L eye, CHF, CKD, stage 4, DM- on Dialysis, HLD, HTN, L patellar tendon repair about 20 years ago. Right knee with a proximately retracted patella. He is not able to actively extend against gravity. Range of motion of the right knee is from approximately 20 degrees to 40 degrees. There is tenderness about the remaining trochlear groove.  53 y.o.  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  MRI does not show significant osteoarthritis and given that I do believe he would benefit from a VY lengthening of the quadriceps with the patella tendon reconstruction with allograft Achilles.   PAIN:  Are you having pain? Yes: NPRS scale: 0/10 Pain location: R knee Pain description: ache Aggravating factors: Worst at night Relieving factors: Ice  PRECAUTIONS: Knee,   RED FLAGS: None   WEIGHT BEARING RESTRICTIONS: Yes WBAT with brace and crutches  FALLS:  Has patient fallen in last 6 months? No  LIVING ENVIRONMENT: Lives with: lives with their family Lives in: House/apartment Stairs: No Has following equipment at home: None  OCCUPATION: On disability  PLOF: Independent  PATIENT GOALS: Patient would like to recover his ability to walk around normally.  NEXT MD VISIT: about 2 weeks  OBJECTIVE:  Note: Objective measures were completed at Evaluation unless otherwise noted.  DIAGNOSTIC FINDINGS:  03/03/23 Xray (4 views right knee): Evidence of an old patella tendon avulsion with significant retraction and osteoarthritis of the patella   MRI right knee: There is full-thickness chronic patella tendon rupture without significant patellofemoral cartilage loss  COGNITION: Overall cognitive status: Within functional limits for tasks assessed     SENSATION: WFL  EDEMA:  None observed  MUSCLE  LENGTH: Hamstrings: WNL  POSTURE: Stands with weight shifted to LLE  PALPATION: Patient's dressings prevent full observation of knee, gentle patellar mobs performed, no pain  LOWER EXTREMITY ROM: LLE WNL  Passive ROM Right eval Left eval RT sitting 04/22/23 AROM  Rt sitting 05/08/23 Rt sitting 05/20/23  Hip flexion       Hip extension       Hip abduction       Hip adduction       Hip internal rotation       Hip external rotation       Knee flexion 45  105 115  118  Knee extension 0  12 12 12   Ankle dorsiflexion 0      Ankle plantarflexion 52      Ankle inversion       Ankle eversion        (Blank rows = not tested)  LOWER EXTREMITY MMT: LLE 5/5, R hip at least 4/5, knee- active quad set- weak, ankle WNL  LOWER EXTREMITY SPECIAL TESTS:  N/T  FUNCTIONAL TESTS:  N/T  GAIT: Distance walked: In clinic distances Assistive device utilized: None Level of assistance: Modified independence Comments: Patient reports he did not receive crutches after surgery. Later reports that his girlfriend has crutches he can adjust. He had this same surgery on LLE about 20 years ago, reports he can safely use crutches, declined to practice in the clinic.                                                                                                                              TREATMENT DATE:  06/03/23 R knee PROM  Bike L3 LAQ 2x10 -eccentric emphasis  Multi angle isometrics QS x10 Leg press 2x12 40# 4in Step over step x10 4in R lateral steps ups/down x10 6in step over step x10    05/27/23 Nustep L6 LAQ RLE 2#x15, 2x10 no weight- emphasis on eccentric RLE HS curl blue 2x12  S2S On airex 2x10 Wall sit x8 10 sec hold RLE step down- attemped but stopped because c/o pain, had hip compensation, and had instability.  4in side step up 2x10 lead with rt Rt leg on 6in box, Lt leg on airex, head swivel Split squat on airex 2x8    05/20/23 NuStep L4 x 6 min LAQ RLE 2x15 RLE HS curls Blue  2x15 Wall sits 2x5 8'' Mini squats 2x10 Split squats 2x5 with UE assit   Lunges with assit   05/08/23 Bike L 2.5 x 6 min GOALS LAQ RLE 3x10 RLE HS curls green 2x15 Mini squats 2x10 Wall sits 2x5 8'' Leg press 40lb 2x10 RLE SLS  04/29/23 Bike L1 x5 min LAQ 3 sets 10 hold at TKE 3 sec, PTA to inhibit hip flexor RLE SAQ RLE 2x10 Little heel elevation  RLE SLR 2x10 40lb resisted backwards walking 2x8 6in step ups x 10 each Sit to stands 3 x 10  holding  blue ball   04/24/23 Nustep L 5 LE only Leg Press ( limited ROM per protocol) 20# RT leg only 10 x 2 sets . Unlocked 10x for ROM.calf raises 30# 2 sets 15 Green tband HS curl 3 sets 10 LAQ 2 sets 10 hold at TKE 3 sec, PTA to inhibit hip flexor Attempted SAQ multi various but could not get quad to fire enough to get heel off plinth STS with left leg straight out with emphasize on RT to get up and extend. 3 sets 5 4 inch step ups RLE 10 x then 6 inch 10 x Bike FULL rev 6 min L 2    04/22/23 Nustep L 5 Black Bar 20x DF and PF Leg Press ( limited ROM per protocol) 20# RT leg only 10 x 2 sets  SLS RT leg with vector swings left LE 10 x without UE - very difficult TM pull 20x  4 inch step ups RT leg working on correct tech and quad activation 2 sets 10 ,fwd and laterally  STS on airex 10x STS with Left leg on 4 inch step 10 x Resisted TKE 2 sets 10 Mini SL squats 10 x   04/17/23 NuStep L4 x 6 min R knee PROM with end range mobs within protocol RLE quad sets x10 RLE SLR 2x10  W/ ER x10 Eccentric SAQ with half black foam roll 2x10 RLE Multi angle extension isometrics  Bike L 1 x 2 min Heel raises black bar 2x15 4in step  ups  RLE 2x5   04/15/23 NuStep L4 x 6 min 20lb resisted gait 4 way x 3 each Sit to stand form elevated surface  RLE Multi angle extension isometrics  RLE HS curls green 2x10 SLR RLE 2x10  W/ ER 2x10 Side lying hip add 2x10 RLE Prone hip Ext RLE   04/08/23 AROM sitting EOB SOC 65, after PROM per protocol AROM 85, 90 pass as per protocol Seated HS red tband 2 sets 10 RLE SLR, SLR w/ ER, Hip Add, Hip abd, Hip Ext x 10 with 2# and cued to activate and engage quad 1st. 2nd set no wt- quad lag with SLRs Alt box taps 4in 20 x 2 sets - PTA supporting RT knee Standing toe and heel raises on black bar 2x10 Resisted gait 4 ways 5 x each with brace on 30#- brace on Gait 354ft focusing on heel strike, hip and knee flex  04/03/23 R knee patellar mobs   RLE SLR, SLR w/ ER, Hip Add, Hip abd, Hip Ext x 10 for two rounds  R ankle 4 way x 10 green  R knee PROM with end range mobs within protocol Standing toe and heel raises on black bar 2x10 Alt box taps 4in Gait 379ft focusing on heel strike, hip and knee flex  03/27/23 R knee patellar mobs  RLE SLR, SLR w/ ER, Hip Add, Hip abd, Hip Ext x 10 for two rounds  R ankle 4 way x 10 green  R knee PROM with end range mobs within protocol   03/20/23 Black bar heel and toe raises 2 sets 10 with brace on Resisted gait 4 ways 5 x each with brace on 30#- brace on Standing hip cable pulleys 4 way 5# 10 x BIL- brace on Feet on ball small bridge 2 sets 10 Feet on ball small HS curl < 60 degrees 2 sets 10, the isometric HS  Estim RUSSIAN 10 min quad set 5sec on/5 sec off STW to RT  knee, quad and incision    03/18/23 Black bar heel and toe raises 2 sets 10 with brace on Resisted gait 4 ways 5 x each with brace on 20 # SLR 4 way lying 2 sets 10 Feet on ball small bridge 2 sets 10 Feet on ball small HS curl < 60 degrees 2 sets 10, the isometric HS  STW to RT knee, quad and incision ( okayed to massage scar at home ,instructed and he VU) tightness ITB and lateral quad  03/11/23 Quad set hold 3 sec 10x Quad set with min a SLR 2 sets 10 HS slide  to 30 degrees 10 x 2 sets HS press down 10x hold 3 sec Patellar mobs with good mobility and non tender Assisted supine hip ABD and ADD 2 sets 10 Ankle DF,PF,INV,Ev red tband 2 sets 10   03/06/23 Education Quad sets, ankle pumps, HS to 45 degrees, SLR with max A Gentle patellar mobs  PATIENT EDUCATION:  Education details: POC, HEP Person educated: Patient Education method: Explanation Education comprehension: verbalized understanding and returned demonstration  HOME EXERCISE PROGRAM: [email or web address]  ASSESSMENT:  CLINICAL IMPRESSION:  He arrived ambulating with normal gait. He has good R knee ROM, but is still lacking quad strength near TKE. I was able  to palpate a quad contraction, but he wasn't strong enough for a LAQ through full ROM. His isometrics were progressively more challenging for him with more knee ext. He did well with stairs and his more confidence in his RLE, however he needed cuing to avoid hip compensation on his RLE.   OBJECTIVE IMPAIRMENTS: Abnormal gait, decreased activity tolerance, decreased balance, decreased coordination, difficulty walking, decreased ROM, decreased strength, impaired flexibility, improper body mechanics, postural dysfunction, and pain.   ACTIVITY LIMITATIONS: carrying, lifting, bending, sitting, standing, squatting, sleeping, stairs, transfers, bathing, toileting, dressing, and locomotion level  PARTICIPATION LIMITATIONS: meal prep, cleaning, laundry, driving, shopping, and community activity  PERSONAL FACTORS: Past/current experiences are also affecting patient's functional outcome.   REHAB POTENTIAL: Good  CLINICAL DECISION MAKING: Evolving/moderate complexity  EVALUATION COMPLEXITY: Low   GOALS: Goals reviewed with patient? Yes  SHORT TERM GOALS: Target date: 03/21/23 I with initial HEP Baseline: Goal status: 03/12/23 MET  LONG TERM GOALS: Target date: 05/29/23  I with final HEP Baseline:  Goal status: INITIAL  2.  Patient will recover R knee AROM to at least 0-120 degrees Baseline: 0-45 Goal status: 04/08/23 progressing   04/22/23 12-105 AROM, Progressing 05/08/23  3.  Patient will recover R knee strength to 5/5 Baseline: Active, but weak quad contraction achieved. Goal status: 04/22/23 progressing Progressing 05/08/23  4.  Patient will perform dynamic SLS activities on RLE on unstable surface with no LOB Baseline:  Goal status: 04/22/23 progressing  5.  Patient will be able to walk x at least 1000' on all surfaces, including up and down steps, I, no gait abnormalities Baseline:  Goal status: 04/08/23 progressing and 04/22/23  6.  Patient will be able to perform dynamic, power moves such  as jumping, hopping, agility ladder drills with no pain or instability in R knee. Baseline:  Goal status: INITIAL   PLAN:  PT FREQUENCY: 1-2x/week  PT DURATION: 12 weeks  PLANNED INTERVENTIONS: 97110-Therapeutic exercises, 97530- Therapeutic activity, [id number]- Neuromuscular re-education, 97535- Self Care, [id number]- Manual therapy, [id number]- Gait training, 97014- Electrical stimulation (unattended), [id number]- Vasopneumatic device, Patient/Family education, Balance training, Stair training, Taping, Dry Needling, Joint mobilization, Cryotherapy, and Moist heat  PLAN FOR NEXT SESSION: Proceed per protocol.  Laurelyn Ponder 06/03/2023, 8:06 AM

## 2023-06-04 DIAGNOSIS — R52 Pain, unspecified: Secondary | ICD-10-CM | POA: Diagnosis not present

## 2023-06-04 DIAGNOSIS — E1122 Type 2 diabetes mellitus with diabetic chronic kidney disease: Secondary | ICD-10-CM | POA: Diagnosis not present

## 2023-06-04 DIAGNOSIS — N2581 Secondary hyperparathyroidism of renal origin: Secondary | ICD-10-CM | POA: Diagnosis not present

## 2023-06-04 DIAGNOSIS — D689 Coagulation defect, unspecified: Secondary | ICD-10-CM | POA: Diagnosis not present

## 2023-06-04 DIAGNOSIS — Z992 Dependence on renal dialysis: Secondary | ICD-10-CM | POA: Diagnosis not present

## 2023-06-04 DIAGNOSIS — D631 Anemia in chronic kidney disease: Secondary | ICD-10-CM | POA: Diagnosis not present

## 2023-06-04 DIAGNOSIS — N186 End stage renal disease: Secondary | ICD-10-CM | POA: Diagnosis not present

## 2023-06-05 ENCOUNTER — Ambulatory Visit: Admitting: Physical Therapy

## 2023-06-06 DIAGNOSIS — D689 Coagulation defect, unspecified: Secondary | ICD-10-CM | POA: Diagnosis not present

## 2023-06-06 DIAGNOSIS — N2581 Secondary hyperparathyroidism of renal origin: Secondary | ICD-10-CM | POA: Diagnosis not present

## 2023-06-06 DIAGNOSIS — E1122 Type 2 diabetes mellitus with diabetic chronic kidney disease: Secondary | ICD-10-CM | POA: Diagnosis not present

## 2023-06-06 DIAGNOSIS — N186 End stage renal disease: Secondary | ICD-10-CM | POA: Diagnosis not present

## 2023-06-06 DIAGNOSIS — Z992 Dependence on renal dialysis: Secondary | ICD-10-CM | POA: Diagnosis not present

## 2023-06-09 DIAGNOSIS — D689 Coagulation defect, unspecified: Secondary | ICD-10-CM | POA: Diagnosis not present

## 2023-06-09 DIAGNOSIS — N186 End stage renal disease: Secondary | ICD-10-CM | POA: Diagnosis not present

## 2023-06-09 DIAGNOSIS — N2581 Secondary hyperparathyroidism of renal origin: Secondary | ICD-10-CM | POA: Diagnosis not present

## 2023-06-09 DIAGNOSIS — Z992 Dependence on renal dialysis: Secondary | ICD-10-CM | POA: Diagnosis not present

## 2023-06-09 DIAGNOSIS — E1122 Type 2 diabetes mellitus with diabetic chronic kidney disease: Secondary | ICD-10-CM | POA: Diagnosis not present

## 2023-06-10 ENCOUNTER — Ambulatory Visit: Admitting: Physical Therapy

## 2023-06-11 DIAGNOSIS — E1122 Type 2 diabetes mellitus with diabetic chronic kidney disease: Secondary | ICD-10-CM | POA: Diagnosis not present

## 2023-06-11 DIAGNOSIS — N2581 Secondary hyperparathyroidism of renal origin: Secondary | ICD-10-CM | POA: Diagnosis not present

## 2023-06-11 DIAGNOSIS — D689 Coagulation defect, unspecified: Secondary | ICD-10-CM | POA: Diagnosis not present

## 2023-06-11 DIAGNOSIS — N186 End stage renal disease: Secondary | ICD-10-CM | POA: Diagnosis not present

## 2023-06-11 DIAGNOSIS — Z992 Dependence on renal dialysis: Secondary | ICD-10-CM | POA: Diagnosis not present

## 2023-06-12 ENCOUNTER — Ambulatory Visit: Admitting: Physical Therapy

## 2023-06-13 DIAGNOSIS — D689 Coagulation defect, unspecified: Secondary | ICD-10-CM | POA: Diagnosis not present

## 2023-06-13 DIAGNOSIS — Z992 Dependence on renal dialysis: Secondary | ICD-10-CM | POA: Diagnosis not present

## 2023-06-13 DIAGNOSIS — E1122 Type 2 diabetes mellitus with diabetic chronic kidney disease: Secondary | ICD-10-CM | POA: Diagnosis not present

## 2023-06-13 DIAGNOSIS — N186 End stage renal disease: Secondary | ICD-10-CM | POA: Diagnosis not present

## 2023-06-13 DIAGNOSIS — N2581 Secondary hyperparathyroidism of renal origin: Secondary | ICD-10-CM | POA: Diagnosis not present

## 2023-06-16 DIAGNOSIS — Z992 Dependence on renal dialysis: Secondary | ICD-10-CM | POA: Diagnosis not present

## 2023-06-16 DIAGNOSIS — N2581 Secondary hyperparathyroidism of renal origin: Secondary | ICD-10-CM | POA: Diagnosis not present

## 2023-06-16 DIAGNOSIS — D689 Coagulation defect, unspecified: Secondary | ICD-10-CM | POA: Diagnosis not present

## 2023-06-16 DIAGNOSIS — N186 End stage renal disease: Secondary | ICD-10-CM | POA: Diagnosis not present

## 2023-06-16 DIAGNOSIS — E1122 Type 2 diabetes mellitus with diabetic chronic kidney disease: Secondary | ICD-10-CM | POA: Diagnosis not present

## 2023-06-17 ENCOUNTER — Ambulatory Visit: Attending: Family Medicine | Admitting: Physical Therapy

## 2023-06-17 DIAGNOSIS — R2681 Unsteadiness on feet: Secondary | ICD-10-CM

## 2023-06-17 DIAGNOSIS — M6281 Muscle weakness (generalized): Secondary | ICD-10-CM | POA: Diagnosis not present

## 2023-06-17 DIAGNOSIS — R600 Localized edema: Secondary | ICD-10-CM | POA: Diagnosis not present

## 2023-06-17 DIAGNOSIS — M25661 Stiffness of right knee, not elsewhere classified: Secondary | ICD-10-CM | POA: Diagnosis not present

## 2023-06-17 NOTE — Therapy (Signed)
 Patient Name: Timothy [name] MRN: [id number] DOB:1970-04-09, 53 y.o., male Today's Date: 06/17/2023  END OF SESSION:  PT End of Session - 06/17/23 1216     Visit Number 17    Date for PT Re-Evaluation 07/18/23    PT Start Time 1230    PT Stop Time 1315    PT Time Calculation (min) 45 min             Past Medical History:  Diagnosis Date   Allergies    Anemia    Blind left eye    CHF (congestive heart failure) (HCC)    Chronic kidney disease    ESRD - HD M,W,F   Diabetes mellitus without complication (HCC)    Type2   Dyspnea    Patient denies SOB since he started dialysis. States he went away after he started dialysis   HLD (hyperlipidemia)    Hypertension    Pneumonia    x2   Past Surgical History:  Procedure Laterality Date   A/V FISTULAGRAM Left 11/19/2022   Procedure: A/V Fistulagram;  Surgeon: Margherita Shell, MD;  Location: [facility];  Service: Cardiovascular;  Laterality: Left;   AV FISTULA PLACEMENT Left 09/19/2022   Procedure: ARTERIOVENOUS (AV) FISTULA CREATION;  Surgeon: Margherita Shell, MD;  Location: [facility] OR;  Service: Vascular;  Laterality: Left;   BIOPSY  12/05/2022   Procedure: BIOPSY;  Surgeon: Sergio Dandy, MD;  Location: [facility];  Service: Gastroenterology;;   CATARACT EXTRACTION Right    COLONOSCOPY WITH PROPOFOL  N/A 12/05/2022   Procedure: COLONOSCOPY WITH PROPOFOL ;  Surgeon: Sergio Dandy, MD;  Location: [facility];  Service: Gastroenterology;  Laterality: N/A;   ESOPHAGOGASTRODUODENOSCOPY (EGD) WITH PROPOFOL  N/A 12/05/2022   Procedure: ESOPHAGOGASTRODUODENOSCOPY (EGD) WITH PROPOFOL ;  Surgeon: Sergio Dandy, MD;  Location: [facility];  Service: Gastroenterology;  Laterality: N/A;   FRACTURE SURGERY Left    [place]   KNEE RECONSTRUCTION Right 03/03/2023   Procedure: RIGHT KNEE QUADRICEPS TENDON LENGTHENING AND PATELLA TENDON RECONSTRUCTION WITH ACHILLES ALLOGRAFT;  Surgeon: Wilhelmenia Harada, MD;   Location: [facility];  Service: Orthopedics;  Laterality: Right;   LIGATION OF COMPETING BRANCHES OF ARTERIOVENOUS FISTULA Left 03/11/2023   Procedure: LIGATION OF COMPETING BRANCHES OF LEFT ARTERIOVENOUS FISTULA;  Surgeon: Philipp Brawn, MD;  Location: [facility] OR;  Service: Vascular;  Laterality: Left;   MENISCUS REPAIR Left    PERIPHERAL VASCULAR INTERVENTION  11/19/2022   Procedure: PERIPHERAL VASCULAR INTERVENTION;  Surgeon: Margherita Shell, MD;  Location: [facility];  Service: Cardiovascular;;  Embolization coil   POLYPECTOMY  12/05/2022   Procedure: POLYPECTOMY;  Surgeon: Sergio Dandy, MD;  Location: [facility];  Service: Gastroenterology;;   Patient Active Problem List   Diagnosis Date Noted   Rupture of right patellar tendon 03/03/2023   Other disorders of phosphorus metabolism 01/04/2023   Heme positive stool 12/05/2022   Generalized abdominal discomfort 12/05/2022   Polyp of ascending colon 12/05/2022   Gastritis and gastroduodenitis 12/05/2022   Gastric polyp 12/05/2022   Foot callus 12/03/2022   Iron deficiency anemia, unspecified 11/01/2022   Angiodysplasia of stomach and duodenum with bleeding 09/06/2022   Other iron deficiency anemias 08/30/2022   Allergy, unspecified, initial encounter 08/20/2022   Anaphylactic reaction due to adverse effect of correct drug or medicament properly administered, initial encounter 08/20/2022   Anaphylactic shock, unspecified, initial encounter 08/20/2022   Anemia in chronic kidney disease 08/20/2022   Coagulation defect, unspecified (HCC)  08/20/2022   Diarrhea, unspecified 08/20/2022   Encounter for immunization 08/20/2022   Encounter for screening for respiratory tuberculosis 08/20/2022   End stage renal disease (HCC) 08/20/2022   Fever, unspecified 08/20/2022   Nausea 08/20/2022   Pain, unspecified 08/20/2022   Pruritus, unspecified 08/20/2022   Dependence on renal dialysis (HCC) 08/16/2022   Hyperlipidemia, unspecified  08/16/2022   Long term (current) use of insulin  (HCC) 08/16/2022   Secondary hyperparathyroidism of renal origin (HCC) 08/16/2022   Vitamin D deficiency, unspecified 08/16/2022   Hematemesis 08/05/2022   Erectile dysfunction 01/18/2021   Pneumonia 11/15/2020   Hemoptysis 11/15/2020   CKD (chronic kidney disease), stage IV (HCC) 11/15/2020   Positive D dimer 11/15/2020   Knee injury, right, subsequent encounter 09/07/2020   Type 2 diabetes mellitus (HCC) 08/22/2020   Acute respiratory failure with hypoxia (HCC) 06/22/2020   Multifocal pneumonia 06/22/2020   Accelerated hypertension 06/20/2019   Elevated troponin 06/19/2019   Chronic diastolic CHF (congestive heart failure) (HCC) 06/19/2019   Hypoalbuminemia 06/19/2019   Hypertensive urgency 06/19/2019   AKI (acute kidney injury) (HCC) 06/19/2019   CHF (congestive heart failure) (HCC) 06/19/2019    PCP: [name], [name], MD  REFERRING PROVIDER: Wilhelmenia Harada, MD   REFERRING DIAG: [phone] (ICD-10-CM) - Rupture of right patellar tendon, initial encounter   THERAPY DIAG:  Stiffness of right knee, not elsewhere classified  Muscle weakness (generalized)  Unsteadiness on feet  Rationale for Evaluation and Treatment: Rehabilitation  ONSET DATE: 02/19/23, 03/03/23-right knee quadriceps tendon lengthening and patella tendon reconstruction with Achilles allograft   SUBJECTIVE:   SUBJECTIVE STATEMENT:  pt states he has been sick since last session . Pt has not been seen for 1 month.  Doing HEP. MD in a couple weeks  PERTINENT HISTORY: Per referring physician note: PMHx: anemia, Blind L eye, CHF, CKD, stage 4, DM- on Dialysis, HLD, HTN, L patellar tendon repair about 20 years ago. Right knee with a proximately retracted patella. He is not able to actively extend against gravity. Range of motion of the right knee is from approximately 20 degrees to 40 degrees. There is tenderness about the remaining trochlear groove.  53 y.o.  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  MRI does not show significant osteoarthritis and given that I do believe he would benefit from a VY lengthening of the quadriceps with the patella tendon reconstruction with allograft Achilles.   PAIN:  Are you having pain? Yes: NPRS scale: 0/10 Pain location: R knee Pain description: ache Aggravating factors: Worst at night Relieving factors: Ice  PRECAUTIONS: Knee,   RED FLAGS: None   WEIGHT BEARING RESTRICTIONS: Yes WBAT with brace and crutches  FALLS:  Has patient fallen in last 6 months? No  LIVING ENVIRONMENT: Lives with: lives with their family Lives in: House/apartment Stairs: No Has following equipment at home: None  OCCUPATION: On disability  PLOF: Independent  PATIENT GOALS: Patient would like to recover his ability to walk around normally.  NEXT MD VISIT: about 2 weeks  OBJECTIVE:  Note: Objective measures were completed at Evaluation unless otherwise noted.  DIAGNOSTIC FINDINGS:  03/03/23 Xray (4 views right knee): Evidence of an old patella tendon avulsion with significant retraction and osteoarthritis of the patella   MRI right knee: There is full-thickness chronic patella tendon rupture without significant patellofemoral cartilage loss  COGNITION: Overall cognitive status: Within functional limits for tasks assessed     SENSATION: WFL  EDEMA:  None observed  MUSCLE LENGTH: Hamstrings: WNL  POSTURE: Stands with weight shifted to LLE  PALPATION: Patient's dressings prevent full observation of knee, gentle patellar mobs performed, no pain  LOWER EXTREMITY ROM: LLE WNL  Passive ROM Right eval Left eval RT sitting 04/22/23 AROM  Rt sitting 05/08/23 Rt sitting 05/20/23  Hip flexion       Hip extension       Hip abduction       Hip adduction       Hip internal rotation       Hip external rotation       Knee flexion 45  105 115  118  Knee extension 0  12 12 12   Ankle dorsiflexion 0      Ankle plantarflexion 52      Ankle inversion       Ankle eversion        (Blank rows = not tested)  LOWER EXTREMITY MMT: LLE 5/5, R hip at least 4/5, knee- active quad set- weak, ankle WNL  LOWER EXTREMITY SPECIAL TESTS:  N/T  FUNCTIONAL TESTS:  N/T  GAIT: Distance walked: In clinic distances Assistive device utilized: None Level of assistance: Modified independence Comments: Patient reports he did not receive crutches after surgery. Later reports that his girlfriend has crutches he can adjust. He had this same surgery on LLE about 20 years ago, reports he can safely use crutches, declined to practice in the clinic.                                                                                                                              TREATMENT DATE:   06/17/23 RT knee AROM  0-122                PROM  10 -130                MMT flex  5/5   ext 4/5                SLS RT  6 sec Nustep LE only L 6 Leg Press 20# LLE 10 x, 30# LLE 10 x, BIL 60# 10 x. Calf Raises 2 sets 10 HS curl RT LE 20# 2 sets 10 Knee ext up with 2 eccentric RT lowering 5# 2 sets 5 -very difficult for pt TM off push/pull 20 x each RT LE Resisted gait backward 10 x 40#    06/03/23 R knee PROM  Bike L3 LAQ 2x10 -eccentric emphasis  Multi angle isometrics QS x10 Leg press 2x12 40# 4in Step over step x10 4in R lateral steps ups/down x10 6in step over step x10    05/27/23 Nustep L6 LAQ RLE 2#x15, 2x10 no weight- emphasis on eccentric RLE HS curl blue 2x12  S2S On airex 2x10 Wall sit x8 10 sec hold RLE step down- attemped but stopped because c/o pain, had hip compensation, and had instability.  4in side  step up 2x10 lead with rt Rt leg on 6in box, Lt leg on airex, head swivel Split squat on airex 2x8    05/20/23 NuStep L4 x 6 min LAQ RLE 2x15 RLE HS curls Blue  2x15 Wall sits 2x5 8'' Mini squats 2x10 Split squats  2x5 with UE assit   Lunges with assit   05/08/23 Bike L 2.5 x 6 min GOALS LAQ RLE 3x10 RLE HS curls green 2x15 Mini squats 2x10 Wall sits 2x5 8'' Leg press 40lb 2x10 RLE SLS  04/29/23 Bike L1 x5 min LAQ 3 sets 10 hold at TKE 3 sec, PTA to inhibit hip flexor RLE SAQ RLE 2x10 Little heel elevation  RLE SLR 2x10 40lb resisted backwards walking 2x8 6in step ups x 10 each Sit to stands 3 x 10 holding blue ball   04/24/23 Nustep L 5 LE only Leg Press ( limited ROM per protocol) 20# RT leg only 10 x 2 sets . Unlocked 10x for ROM.calf raises 30# 2 sets 15 Green tband HS curl 3 sets 10 LAQ 2 sets 10 hold at TKE 3 sec, PTA to inhibit hip flexor Attempted SAQ multi various but could not get quad to fire enough to get heel off plinth STS with left leg straight out with emphasize on RT to get up and extend. 3 sets 5 4 inch step ups RLE 10 x then 6 inch 10 x Bike FULL rev 6 min L 2    04/22/23 Nustep L 5 Black Bar 20x DF and PF Leg Press ( limited ROM per protocol) 20# RT leg only 10 x 2 sets  SLS RT leg with vector swings left LE 10 x without UE - very difficult TM pull 20x  4 inch step ups RT leg working on correct tech and quad activation 2 sets 10 ,fwd and laterally  STS on airex 10x STS with Left leg on 4 inch step 10 x Resisted TKE 2 sets 10 Mini SL squats 10 x   04/17/23 NuStep L4 x 6 min R knee PROM with end range mobs within protocol RLE quad sets x10 RLE SLR 2x10  W/ ER x10 Eccentric SAQ with half black foam roll 2x10 RLE Multi angle extension isometrics  Bike L 1 x 2 min Heel raises black bar 2x15 4in step  ups  RLE 2x5   04/15/23 NuStep L4 x 6 min 20lb resisted gait 4 way x 3 each Sit to stand form elevated surface  RLE Multi angle extension isometrics  RLE HS curls green 2x10 SLR RLE 2x10  W/ ER 2x10 Side lying hip add 2x10 RLE Prone hip Ext RLE   04/08/23 AROM sitting EOB SOC 65, after PROM per protocol AROM 85, 90 pass as per protocol Seated  HS red tband 2 sets 10 RLE SLR, SLR w/ ER, Hip Add, Hip abd, Hip Ext x 10 with 2# and cued to activate and engage quad 1st. 2nd set no wt- quad lag with SLRs Alt box taps 4in 20 x 2 sets - PTA supporting RT knee Standing toe and heel raises on black bar 2x10 Resisted gait 4 ways 5 x each with brace on 30#- brace on Gait 323ft focusing on heel strike, hip and knee flex  04/03/23 R knee patellar mobs  RLE SLR, SLR w/ ER, Hip Add, Hip abd, Hip Ext x 10 for two rounds  R ankle 4 way x 10 green  R knee PROM with end range mobs within protocol Standing toe  and heel raises on black bar 2x10 Alt box taps 4in Gait 379ft focusing on heel strike, hip and knee flex  03/27/23 R knee patellar mobs  RLE SLR, SLR w/ ER, Hip Add, Hip abd, Hip Ext x 10 for two rounds  R ankle 4 way x 10 green  R knee PROM with end range mobs within protocol   03/20/23 Black bar heel and toe raises 2 sets 10 with brace on Resisted gait 4 ways 5 x each with brace on 30#- brace on Standing hip cable pulleys 4 way 5# 10 x BIL- brace on Feet on ball small bridge 2 sets 10 Feet on ball small HS curl < 60 degrees 2 sets 10, the isometric HS  Estim RUSSIAN 10 min quad set 5sec on/5 sec off STW to RT knee, quad and incision    03/18/23 Black bar heel and toe raises 2 sets 10 with brace on Resisted gait 4 ways 5 x each with brace on 20 # SLR 4 way lying 2 sets 10 Feet on ball small bridge 2 sets 10 Feet on ball small HS curl < 60 degrees 2 sets 10, the isometric HS  STW to RT knee, quad and incision ( okayed to massage scar at home ,instructed and he VU) tightness ITB and lateral quad  03/11/23 Quad set hold 3 sec 10x Quad set with min a SLR 2 sets 10 HS slide  to 30 degrees 10 x 2 sets HS press down 10x hold 3 sec Patellar mobs with good mobility and non tender Assisted supine hip ABD and ADD 2 sets 10 Ankle DF,PF,INV,Ev red tband 2 sets 10   03/06/23 Education Quad sets, ankle pumps, HS to 45 degrees, SLR with  max A Gentle patellar mobs  PATIENT EDUCATION:  Education details: POC, HEP Person educated: Patient Education method: Explanation Education comprehension: verbalized understanding and returned demonstration  HOME EXERCISE PROGRAM: [email or web address]  ASSESSMENT:  CLINICAL IMPRESSION:  He arrived ambulating with normal gait. He has good R knee ROM, but is still lacking TKE and quad strength at end range. ROM/MMT and goals tested and documented. Progressed strength with focus on TKE and eccentric quad. Quad is definitely weakness vs tightness. pt will continue to benefit from skilled therapy interventions to maximize normal strength and func. OBJECTIVE IMPAIRMENTS: Abnormal gait, decreased activity tolerance, decreased balance, decreased coordination, difficulty walking, decreased ROM, decreased strength, impaired flexibility, improper body mechanics, postural dysfunction, and pain.   ACTIVITY LIMITATIONS: carrying, lifting, bending, sitting, standing, squatting, sleeping, stairs, transfers, bathing, toileting, dressing, and locomotion level  PARTICIPATION LIMITATIONS: meal prep, cleaning, laundry, driving, shopping, and community activity  PERSONAL FACTORS: Past/current experiences are also affecting patient's functional outcome.   REHAB POTENTIAL: Good  CLINICAL DECISION MAKING: Evolving/moderate complexity  EVALUATION COMPLEXITY: Low   GOALS: Goals reviewed with patient? Yes  SHORT TERM GOALS: Target date: 03/21/23 I with initial HEP Baseline: Goal status: 03/12/23 MET  LONG TERM GOALS: Target date: 07/15/2023    I with final HEP Baseline:  Goal status: 06/17/23 evolviing  2.  Patient will recover R knee AROM to at least 0-120 degrees Baseline: 0-45 Goal status: 04/08/23 progressing   04/22/23 12-105 AROM, Progressing 05/08/23   06/17/23 pt still lacking TKE  3.  Patient will recover R knee strength to 5/5 Baseline: Active, but weak quad contraction achieved. Goal status: 04/22/23  progressing Progressing 05/08/23  06/17/23 met for flexion  4.  Patient will perform dynamic SLS activities on RLE on unstable surface with  no LOB Baseline:  Goal status: 04/22/23 progressing  06/17/23  progressing  5.  Patient will be able to walk x at least 1000' on all surfaces, including up and down steps, I, no gait abnormalities Baseline:  Goal status: 04/08/23 progressing and 04/22/23   06/17/23 progressing, steps still an still issue with eccentric  6.  Patient will be able to perform dynamic, power moves such as jumping, hopping, agility ladder drills with no pain or instability in R knee. Baseline:  Goal status: INITIAL   PLAN:  PT FREQUENCY: 2x/week  PT DURATION: 4 weeks  PLANNED INTERVENTIONS: 97110-Therapeutic exercises, 97530- Therapeutic activity, [id number]- Neuromuscular re-education, 97535- Self Care, 16109- Manual therapy, [id number]- Gait training, 97014- Electrical stimulation (unattended), 97016- Vasopneumatic device, Patient/Family education, Balance training, Stair training, Taping, Dry Needling, Joint mobilization, Cryotherapy, and Moist heat  PLAN FOR NEXT SESSION: focus on quad strength . MD 2 weeks. Renewal DONE    Kiyra Slaubaugh,ANGIE, PTA 06/17/2023, 12:17 PM    [facility] at [facility] [address] [facility] [place], [place], [address] Phone: [phone]   Fax:  [phone]  Patient Details  Name: Asser Dail MRN: [id number] Date of Birth: 1970-08-11 Referring Provider:  [name], [name], MD  Encounter Date: 06/17/2023   Aquilla Bayley, PTA 06/17/2023, 12:17 PM  [facility] [address] [place], [place], [address] Phone: [phone]   Fax:  [phone]

## 2023-06-18 DIAGNOSIS — N186 End stage renal disease: Secondary | ICD-10-CM | POA: Diagnosis not present

## 2023-06-18 DIAGNOSIS — N2581 Secondary hyperparathyroidism of renal origin: Secondary | ICD-10-CM | POA: Diagnosis not present

## 2023-06-18 DIAGNOSIS — E1122 Type 2 diabetes mellitus with diabetic chronic kidney disease: Secondary | ICD-10-CM | POA: Diagnosis not present

## 2023-06-18 DIAGNOSIS — Z992 Dependence on renal dialysis: Secondary | ICD-10-CM | POA: Diagnosis not present

## 2023-06-18 DIAGNOSIS — D689 Coagulation defect, unspecified: Secondary | ICD-10-CM | POA: Diagnosis not present

## 2023-06-19 ENCOUNTER — Ambulatory Visit: Admitting: Physical Therapy

## 2023-06-20 DIAGNOSIS — N2581 Secondary hyperparathyroidism of renal origin: Secondary | ICD-10-CM | POA: Diagnosis not present

## 2023-06-20 DIAGNOSIS — N186 End stage renal disease: Secondary | ICD-10-CM | POA: Diagnosis not present

## 2023-06-20 DIAGNOSIS — Z992 Dependence on renal dialysis: Secondary | ICD-10-CM | POA: Diagnosis not present

## 2023-06-20 DIAGNOSIS — D689 Coagulation defect, unspecified: Secondary | ICD-10-CM | POA: Diagnosis not present

## 2023-06-20 DIAGNOSIS — E1122 Type 2 diabetes mellitus with diabetic chronic kidney disease: Secondary | ICD-10-CM | POA: Diagnosis not present

## 2023-06-23 DIAGNOSIS — N186 End stage renal disease: Secondary | ICD-10-CM | POA: Diagnosis not present

## 2023-06-23 DIAGNOSIS — Z992 Dependence on renal dialysis: Secondary | ICD-10-CM | POA: Diagnosis not present

## 2023-06-23 DIAGNOSIS — N2581 Secondary hyperparathyroidism of renal origin: Secondary | ICD-10-CM | POA: Diagnosis not present

## 2023-06-23 DIAGNOSIS — E1122 Type 2 diabetes mellitus with diabetic chronic kidney disease: Secondary | ICD-10-CM | POA: Diagnosis not present

## 2023-06-23 DIAGNOSIS — D689 Coagulation defect, unspecified: Secondary | ICD-10-CM | POA: Diagnosis not present

## 2023-06-24 ENCOUNTER — Ambulatory Visit: Admitting: Physical Therapy

## 2023-06-25 DIAGNOSIS — D689 Coagulation defect, unspecified: Secondary | ICD-10-CM | POA: Diagnosis not present

## 2023-06-25 DIAGNOSIS — N2581 Secondary hyperparathyroidism of renal origin: Secondary | ICD-10-CM | POA: Diagnosis not present

## 2023-06-25 DIAGNOSIS — E1122 Type 2 diabetes mellitus with diabetic chronic kidney disease: Secondary | ICD-10-CM | POA: Diagnosis not present

## 2023-06-25 DIAGNOSIS — Z992 Dependence on renal dialysis: Secondary | ICD-10-CM | POA: Diagnosis not present

## 2023-06-25 DIAGNOSIS — N186 End stage renal disease: Secondary | ICD-10-CM | POA: Diagnosis not present

## 2023-07-01 ENCOUNTER — Ambulatory Visit: Admitting: Physical Therapy

## 2023-07-01 DIAGNOSIS — D689 Coagulation defect, unspecified: Secondary | ICD-10-CM | POA: Diagnosis not present

## 2023-07-01 DIAGNOSIS — N186 End stage renal disease: Secondary | ICD-10-CM | POA: Diagnosis not present

## 2023-07-01 DIAGNOSIS — E1122 Type 2 diabetes mellitus with diabetic chronic kidney disease: Secondary | ICD-10-CM | POA: Diagnosis not present

## 2023-07-01 DIAGNOSIS — N2581 Secondary hyperparathyroidism of renal origin: Secondary | ICD-10-CM | POA: Diagnosis not present

## 2023-07-01 DIAGNOSIS — Z992 Dependence on renal dialysis: Secondary | ICD-10-CM | POA: Diagnosis not present

## 2023-07-02 DIAGNOSIS — E1122 Type 2 diabetes mellitus with diabetic chronic kidney disease: Secondary | ICD-10-CM | POA: Diagnosis not present

## 2023-07-02 DIAGNOSIS — N2581 Secondary hyperparathyroidism of renal origin: Secondary | ICD-10-CM | POA: Diagnosis not present

## 2023-07-02 DIAGNOSIS — N186 End stage renal disease: Secondary | ICD-10-CM | POA: Diagnosis not present

## 2023-07-02 DIAGNOSIS — D689 Coagulation defect, unspecified: Secondary | ICD-10-CM | POA: Diagnosis not present

## 2023-07-02 DIAGNOSIS — Z992 Dependence on renal dialysis: Secondary | ICD-10-CM | POA: Diagnosis not present

## 2023-07-03 ENCOUNTER — Ambulatory Visit (INDEPENDENT_AMBULATORY_CARE_PROVIDER_SITE_OTHER): Payer: 59 | Admitting: Family Medicine

## 2023-07-03 ENCOUNTER — Encounter: Payer: Self-pay | Admitting: Physical Therapy

## 2023-07-03 ENCOUNTER — Ambulatory Visit: Admitting: Physical Therapy

## 2023-07-03 VITALS — BP 156/88 | HR 72 | Ht 66.0 in | Wt 181.6 lb

## 2023-07-03 DIAGNOSIS — M79672 Pain in left foot: Secondary | ICD-10-CM | POA: Diagnosis not present

## 2023-07-03 DIAGNOSIS — H9193 Unspecified hearing loss, bilateral: Secondary | ICD-10-CM | POA: Diagnosis not present

## 2023-07-03 DIAGNOSIS — M6281 Muscle weakness (generalized): Secondary | ICD-10-CM

## 2023-07-03 DIAGNOSIS — R2681 Unsteadiness on feet: Secondary | ICD-10-CM

## 2023-07-03 DIAGNOSIS — R600 Localized edema: Secondary | ICD-10-CM

## 2023-07-03 DIAGNOSIS — M79671 Pain in right foot: Secondary | ICD-10-CM

## 2023-07-03 DIAGNOSIS — H9190 Unspecified hearing loss, unspecified ear: Secondary | ICD-10-CM | POA: Insufficient documentation

## 2023-07-03 DIAGNOSIS — M25661 Stiffness of right knee, not elsewhere classified: Secondary | ICD-10-CM

## 2023-07-03 DIAGNOSIS — M79673 Pain in unspecified foot: Secondary | ICD-10-CM | POA: Insufficient documentation

## 2023-07-03 MED ORDER — PEN NEEDLES 31G X 5 MM MISC: 1.0000 | Freq: Every day | 0 refills | Status: AC

## 2023-07-03 NOTE — Patient Instructions (Signed)
  Medication Instructions:  Your physician recommends that you continue on your current medications as directed. Please refer to the Current Medication list given to you today. --If you need a refill on any your medications before your next appointment, please call your pharmacy first. If no refills are authorized on file call the office.-- Lab Work: Your physician has recommended that you have lab work today: no If you have labs (blood work) drawn today and your tests are completely normal, you will receive your results via MyChart message OR a phone call from our staff.  Please ensure you check your voicemail in the event that you authorized detailed messages to be left on a delegated number. If you have any lab test that is abnormal or we need to change your treatment, we will call you to review the results.  Referrals/Procedures/Imaging: no  Follow-Up: Your next appointment:   Your physician recommends that you schedule a follow-up appointment in: 4 months with Dr. [name]  You will receive a text message or e-mail with a link to a survey about your care and experience with [place] today! We would greatly appreciate your feedback!   Thanks for letting us be apart of your health journey!!  Primary Care and Sports Medicine   Dr. Ceasar Mons [place]   We encourage you to activate your patient portal called "MyChart".  Sign up information is provided on this After Visit Summary.  MyChart is used to connect with patients for Virtual Visits (Telemedicine).  Patients are able to view lab/test results, encounter notes, upcoming appointments, etc.  Non-urgent messages can be sent to your provider as well. To learn more about what you can do with MyChart, please visit --  [email or web address].

## 2023-07-03 NOTE — Assessment & Plan Note (Signed)
 Notes that he has had increased foot pain bilaterally.  This started recently after traveling to [place]  and doing more walking.  Since that time he has had notable increase in pain in the bottom of both feet.  He reports that it feels like "nerve pain".  He has not had any new numbness or tingling.  Pain will be worse with activity, sometimes at rest.  He has tried some icing.  Pain worse with walking, first of out of bed. On exam, patient has notable pes planus.  He does have tenderness to palpation near the calcaneus bilaterally and extending distally along the plantar surface.  No obvious bruising, no swelling. Based on history and exam, I do feel that specific nerve pain or neuropathy is less likely to be because of current acute increase in foot pain.  Feel that it is more likely to be related to plantar fasciitis or pain related to intrinsic muscle foot.  We discussed general conservative measures which can be utilized.  He does follow with podiatry and I do feel that having close follow-up with them would be warranted given his notable underlying chronic medical history.

## 2023-07-03 NOTE — Assessment & Plan Note (Signed)
 Patient has noted recent issues with decreased hearing bilaterally.  He denies any ear pain, ringing in the ears.  He does wonder about having formal evaluation regarding this.  We can proceed with referral to audiology for further assessment

## 2023-07-03 NOTE — Therapy (Signed)
 Patient Name: Timothy [name] MRN: [id number] DOB:1970-05-02, 53 y.o., male Today's Date: 07/03/2023  END OF SESSION:  PT End of Session - 07/03/23 0802     Visit Number 18    Date for PT Re-Evaluation 07/18/23    PT Start Time 0800    PT Stop Time 0845    PT Time Calculation (min) 45 min    Activity Tolerance Patient tolerated treatment well    Behavior During Therapy WFL for tasks assessed/performed             Past Medical History:  Diagnosis Date   Allergies    Anemia    Blind left eye    CHF (congestive heart failure) (HCC)    Chronic kidney disease    ESRD - HD M,W,F   Diabetes mellitus without complication (HCC)    Type2   Dyspnea    Patient denies SOB since he started dialysis. States he went away after he started dialysis   HLD (hyperlipidemia)    Hypertension    Pneumonia    x2   Past Surgical History:  Procedure Laterality Date   A/V FISTULAGRAM Left 11/19/2022   Procedure: A/V Fistulagram;  Surgeon: Margherita Shell, MD;  Location: [facility];  Service: Cardiovascular;  Laterality: Left;   AV FISTULA PLACEMENT Left 09/19/2022   Procedure: ARTERIOVENOUS (AV) FISTULA CREATION;  Surgeon: Margherita Shell, MD;  Location: [facility] OR;  Service: Vascular;  Laterality: Left;   BIOPSY  12/05/2022   Procedure: BIOPSY;  Surgeon: Sergio Dandy, MD;  Location: [facility];  Service: Gastroenterology;;   CATARACT EXTRACTION Right    COLONOSCOPY WITH PROPOFOL  N/A 12/05/2022   Procedure: COLONOSCOPY WITH PROPOFOL ;  Surgeon: Sergio Dandy, MD;  Location: [facility];  Service: Gastroenterology;  Laterality: N/A;   ESOPHAGOGASTRODUODENOSCOPY (EGD) WITH PROPOFOL  N/A 12/05/2022   Procedure: ESOPHAGOGASTRODUODENOSCOPY (EGD) WITH PROPOFOL ;  Surgeon: Sergio Dandy, MD;  Location: [facility];  Service: Gastroenterology;  Laterality: N/A;   FRACTURE SURGERY Left    [place]   KNEE RECONSTRUCTION Right 03/03/2023   Procedure: RIGHT KNEE  QUADRICEPS TENDON LENGTHENING AND PATELLA TENDON RECONSTRUCTION WITH ACHILLES ALLOGRAFT;  Surgeon: Wilhelmenia Harada, MD;  Location: [facility] OR;  Service: Orthopedics;  Laterality: Right;   LIGATION OF COMPETING BRANCHES OF ARTERIOVENOUS FISTULA Left 03/11/2023   Procedure: LIGATION OF COMPETING BRANCHES OF LEFT ARTERIOVENOUS FISTULA;  Surgeon: Philipp Brawn, MD;  Location: [facility] OR;  Service: Vascular;  Laterality: Left;   MENISCUS REPAIR Left    PERIPHERAL VASCULAR INTERVENTION  11/19/2022   Procedure: PERIPHERAL VASCULAR INTERVENTION;  Surgeon: Margherita Shell, MD;  Location: [facility];  Service: Cardiovascular;;  Embolization coil   POLYPECTOMY  12/05/2022   Procedure: POLYPECTOMY;  Surgeon: Sergio Dandy, MD;  Location: [facility];  Service: Gastroenterology;;   Patient Active Problem List   Diagnosis Date Noted   Rupture of right patellar tendon 03/03/2023   Other disorders of phosphorus metabolism 01/04/2023   Heme positive stool 12/05/2022   Generalized abdominal discomfort 12/05/2022   Polyp of ascending colon 12/05/2022   Gastritis and gastroduodenitis 12/05/2022   Gastric polyp 12/05/2022   Foot callus 12/03/2022   Iron deficiency anemia, unspecified 11/01/2022   Angiodysplasia of stomach and duodenum with bleeding 09/06/2022   Other iron deficiency anemias 08/30/2022   Allergy, unspecified, initial encounter 08/20/2022   Anaphylactic reaction due to adverse effect of correct drug or medicament properly administered, initial encounter 08/20/2022   Anaphylactic  shock, unspecified, initial encounter 08/20/2022   Anemia in chronic kidney disease 08/20/2022   Coagulation defect, unspecified (HCC) 08/20/2022   Diarrhea, unspecified 08/20/2022   Encounter for immunization 08/20/2022   Encounter for screening for respiratory tuberculosis 08/20/2022   End stage renal disease (HCC) 08/20/2022   Fever, unspecified 08/20/2022   Nausea 08/20/2022   Pain, unspecified  08/20/2022   Pruritus, unspecified 08/20/2022   Dependence on renal dialysis (HCC) 08/16/2022   Hyperlipidemia, unspecified 08/16/2022   Long term (current) use of insulin  (HCC) 08/16/2022   Secondary hyperparathyroidism of renal origin (HCC) 08/16/2022   Vitamin D deficiency, unspecified 08/16/2022   Hematemesis 08/05/2022   Erectile dysfunction 01/18/2021   Pneumonia 11/15/2020   Hemoptysis 11/15/2020   CKD (chronic kidney disease), stage IV (HCC) 11/15/2020   Positive D dimer 11/15/2020   Knee injury, right, subsequent encounter 09/07/2020   Type 2 diabetes mellitus (HCC) 08/22/2020   Acute respiratory failure with hypoxia (HCC) 06/22/2020   Multifocal pneumonia 06/22/2020   Accelerated hypertension 06/20/2019   Elevated troponin 06/19/2019   Chronic diastolic CHF (congestive heart failure) (HCC) 06/19/2019   Hypoalbuminemia 06/19/2019   Hypertensive urgency 06/19/2019   AKI (acute kidney injury) (HCC) 06/19/2019   CHF (congestive heart failure) (HCC) 06/19/2019    PCP: [name], [name], MD  REFERRING PROVIDER: Wilhelmenia Harada, MD   REFERRING DIAG: [phone] (ICD-10-CM) - Rupture of right patellar tendon, initial encounter   THERAPY DIAG:  Stiffness of right knee, not elsewhere classified  Muscle weakness (generalized)  Unsteadiness on feet  Localized edema  Rationale for Evaluation and Treatment: Rehabilitation  ONSET DATE: 02/19/23, 03/03/23-right knee quadriceps tendon lengthening and patella tendon reconstruction with Achilles allograft   SUBJECTIVE:   SUBJECTIVE STATEMENT:    "Its weird, I can go up the stairs better, its just not getting stronger no matter what I do"  PERTINENT HISTORY: Per referring physician note: PMHx: anemia, Blind L eye, CHF, CKD, stage 4, DM- on Dialysis, HLD, HTN, L patellar tendon repair about 20 years ago. Right knee with a proximately retracted patella. He is not able to actively extend against gravity. Range of motion of the  right knee is from approximately 20 degrees to 40 degrees. There is tenderness about the remaining trochlear groove.  53 y.o. 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  MRI does not show significant osteoarthritis and given that I do believe he would benefit from a VY lengthening of the quadriceps with the patella tendon reconstruction with allograft Achilles.   PAIN:  Are you having pain? Yes: NPRS scale: 0/10 Pain location: R knee Pain description: ache Aggravating factors: Worst at night Relieving factors: Ice  PRECAUTIONS: Knee,   RED FLAGS: None   WEIGHT BEARING RESTRICTIONS: Yes WBAT with brace and crutches  FALLS:  Has patient fallen in last 6 months? No  LIVING ENVIRONMENT: Lives with: lives with their family Lives in: House/apartment Stairs: No Has following equipment at home: None  OCCUPATION: On disability  PLOF: Independent  PATIENT GOALS: Patient would like to recover his ability to walk around normally.  NEXT MD VISIT: about 2 weeks  OBJECTIVE:  Note: Objective measures were completed at Evaluation unless otherwise noted.  DIAGNOSTIC FINDINGS:  03/03/23 Xray (4 views right knee): Evidence of an old patella tendon avulsion with significant retraction and osteoarthritis of the patella   MRI right knee: There is full-thickness chronic patella tendon rupture without significant patellofemoral cartilage loss  COGNITION: Overall  cognitive status: Within functional limits for tasks assessed     SENSATION: WFL  EDEMA:  None observed  MUSCLE LENGTH: Hamstrings: WNL  POSTURE: Stands with weight shifted to LLE  PALPATION: Patient's dressings prevent full observation of knee, gentle patellar mobs performed, no pain  LOWER EXTREMITY ROM: LLE WNL  Passive ROM Right eval Left eval RT sitting 04/22/23 AROM  Rt sitting 05/08/23 Rt sitting 05/20/23  Hip flexion       Hip extension        Hip abduction       Hip adduction       Hip internal rotation       Hip external rotation       Knee flexion 45  105 115 118  Knee extension 0  12 12 12   Ankle dorsiflexion 0      Ankle plantarflexion 52      Ankle inversion       Ankle eversion        (Blank rows = not tested)  LOWER EXTREMITY MMT: LLE 5/5, R hip at least 4/5, knee- active quad set- weak, ankle WNL  LOWER EXTREMITY SPECIAL TESTS:  N/T  FUNCTIONAL TESTS:  N/T  GAIT: Distance walked: In clinic distances Assistive device utilized: None Level of assistance: Modified independence Comments: Patient reports he did not receive crutches after surgery. Later reports that his girlfriend has crutches he can adjust. He had this same surgery on LLE about 20 years ago, reports he can safely use crutches, declined to practice in the clinic.                                                                                                                              TREATMENT DATE:  07/03/23 NuStep L 5 x 6 min Gait to the back building, three flights of stairs, eccentric load weakness with RLE, ALT pattern, first flight 1 rail  Leg press plyo 20lb x10 RLE single leg hops Min guard 3x10 RLE LAQ 5lb 3x10 significant quad lag   06/17/23 RT knee AROM  0-122                PROM  10 -130                MMT flex  5/5   ext 4/5                SLS RT  6 sec Nustep LE only L 6 Leg Press 20# LLE 10 x, 30# LLE 10 x, BIL 60# 10 x. Calf Raises 2 sets 10 HS curl RT LE 20# 2 sets 10 Knee ext up with 2 eccentric RT lowering 5# 2 sets 5 -very difficult for pt TM off push/pull 20 x each RT LE Resisted gait backward 10 x 40#    06/03/23 R knee PROM  Bike L3 LAQ 2x10 -eccentric emphasis  Multi angle isometrics QS x10 Leg press 2x12 40# 4in  Step over step x10 4in R lateral steps ups/down x10 6in step over step x10    05/27/23 Nustep L6 LAQ RLE 2#x15, 2x10 no weight- emphasis on eccentric RLE HS curl blue 2x12   S2S On airex 2x10 Wall sit x8 10 sec hold RLE step down- attemped but stopped because c/o pain, had hip compensation, and had instability.  4in side step up 2x10 lead with rt Rt leg on 6in box, Lt leg on airex, head swivel Split squat on airex 2x8    05/20/23 NuStep L4 x 6 min LAQ RLE 2x15 RLE HS curls Blue  2x15 Wall sits 2x5 8'' Mini squats 2x10 Split squats 2x5 with UE assit   Lunges with assit   05/08/23 Bike L 2.5 x 6 min GOALS LAQ RLE 3x10 RLE HS curls green 2x15 Mini squats 2x10 Wall sits 2x5 8'' Leg press 40lb 2x10 RLE SLS  04/29/23 Bike L1 x5 min LAQ 3 sets 10 hold at TKE 3 sec, PTA to inhibit hip flexor RLE SAQ RLE 2x10 Little heel elevation  RLE SLR 2x10 40lb resisted backwards walking 2x8 6in step ups x 10 each Sit to stands 3 x 10 holding blue ball   04/24/23 Nustep L 5 LE only Leg Press ( limited ROM per protocol) 20# RT leg only 10 x 2 sets . Unlocked 10x for ROM.calf raises 30# 2 sets 15 Green tband HS curl 3 sets 10 LAQ 2 sets 10 hold at TKE 3 sec, PTA to inhibit hip flexor Attempted SAQ multi various but could not get quad to fire enough to get heel off plinth STS with left leg straight out with emphasize on RT to get up and extend. 3 sets 5 4 inch step ups RLE 10 x then 6 inch 10 x Bike FULL rev 6 min L 2    04/22/23 Nustep L 5 Black Bar 20x DF and PF Leg Press ( limited ROM per protocol) 20# RT leg only 10 x 2 sets  SLS RT leg with vector swings left LE 10 x without UE - very difficult TM pull 20x  4 inch step ups RT leg working on correct tech and quad activation 2 sets 10 ,fwd and laterally  STS on airex 10x STS with Left leg on 4 inch step 10 x Resisted TKE 2 sets 10 Mini SL squats 10 x   04/17/23 NuStep L4 x 6 min R knee PROM with end range mobs within protocol RLE quad sets x10 RLE SLR 2x10  W/ ER x10 Eccentric SAQ with half black foam roll 2x10 RLE Multi angle extension isometrics  Bike L 1 x 2 min Heel raises black  bar 2x15 4in step  ups  RLE 2x5   04/15/23 NuStep L4 x 6 min 20lb resisted gait 4 way x 3 each Sit to stand form elevated surface  RLE Multi angle extension isometrics  RLE HS curls green 2x10 SLR RLE 2x10  W/ ER 2x10 Side lying hip add 2x10 RLE Prone hip Ext RLE   04/08/23 AROM sitting EOB SOC 65, after PROM per protocol AROM 85, 90 pass as per protocol Seated HS red tband 2 sets 10 RLE SLR, SLR w/ ER, Hip Add, Hip abd, Hip Ext x 10 with 2# and cued to activate and engage quad 1st. 2nd set no wt- quad lag with SLRs Alt box taps 4in 20 x 2 sets - PTA supporting RT knee Standing toe and heel raises on black bar 2x10 Resisted  gait 4 ways 5 x each with brace on 30#- brace on Gait 319ft focusing on heel strike, hip and knee flex  04/03/23 R knee patellar mobs  RLE SLR, SLR w/ ER, Hip Add, Hip abd, Hip Ext x 10 for two rounds  R ankle 4 way x 10 green  R knee PROM with end range mobs within protocol Standing toe and heel raises on black bar 2x10 Alt box taps 4in Gait 387ft focusing on heel strike, hip and knee flex  03/27/23 R knee patellar mobs  RLE SLR, SLR w/ ER, Hip Add, Hip abd, Hip Ext x 10 for two rounds  R ankle 4 way x 10 green  R knee PROM with end range mobs within protocol   03/20/23 Black bar heel and toe raises 2 sets 10 with brace on Resisted gait 4 ways 5 x each with brace on 30#- brace on Standing hip cable pulleys 4 way 5# 10 x BIL- brace on Feet on ball small bridge 2 sets 10 Feet on ball small HS curl < 60 degrees 2 sets 10, the isometric HS  Estim RUSSIAN 10 min quad set 5sec on/5 sec off STW to RT knee, quad and incision   03/06/23 Education Quad sets, ankle pumps, HS to 45 degrees, SLR with max A Gentle patellar mobs  PATIENT EDUCATION:  Education details: POC, HEP Person educated: Patient Education method: Explanation Education comprehension: verbalized understanding and returned demonstration  HOME EXERCISE  PROGRAM: [email or web address]  ASSESSMENT:  CLINICAL IMPRESSION:  He arrived ambulating with normal gait. He has good R knee ROM, but is still lacking TKE and quad strength at end range.  R patellar alta noted with the R knee. This cause potentially be the reason for lack of TKE despite having good quad activation. Pt would land hard on LLE when controlling stair descent with RLE, Assist needed with single leg plyos on leg press, Min to mod assist needed with SL hops.  Quad is weakness vs tightness. pt will continue to benefit from skilled therapy interventions to maximize normal strength and func.  OBJECTIVE IMPAIRMENTS: Abnormal gait, decreased activity tolerance, decreased balance, decreased coordination, difficulty walking, decreased ROM, decreased strength, impaired flexibility, improper body mechanics, postural dysfunction, and pain.   ACTIVITY LIMITATIONS: carrying, lifting, bending, sitting, standing, squatting, sleeping, stairs, transfers, bathing, toileting, dressing, and locomotion level  PARTICIPATION LIMITATIONS: meal prep, cleaning, laundry, driving, shopping, and community activity  PERSONAL FACTORS: Past/current experiences are also affecting patient's functional outcome.   REHAB POTENTIAL: Good  CLINICAL DECISION MAKING: Evolving/moderate complexity  EVALUATION COMPLEXITY: Low   GOALS: Goals reviewed with patient? Yes  SHORT TERM GOALS: Target date: 03/21/23 I with initial HEP Baseline: Goal status: 03/12/23 MET  LONG TERM GOALS: Target date: 07/15/2023    I with final HEP Baseline:  Goal status: 06/17/23 evolviing  2.  Patient will recover R knee AROM to at least 0-120 degrees Baseline: 0-45 Goal status: 04/08/23 progressing   04/22/23 12-105 AROM, Progressing 05/08/23   06/17/23 pt still lacking TKE  3.  Patient will recover R knee strength to 5/5 Baseline: Active, but weak quad contraction achieved. Goal status: 04/22/23 progressing Progressing 05/08/23  06/17/23 met for  flexion  4.  Patient will perform dynamic SLS activities on RLE on unstable surface with no LOB Baseline:  Goal status: 04/22/23 progressing  06/17/23  progressing  5.  Patient will be able to walk x at least 1000' on all surfaces, including up and down steps, I, no gait  abnormalities Baseline:  Goal status: 04/08/23 progressing and 04/22/23   06/17/23 progressing, steps still an still issue with eccentric  6.  Patient will be able to perform dynamic, power moves such as jumping, hopping, agility ladder drills with no pain or instability in R knee. Baseline:  Goal status: INITIAL   PLAN:  PT FREQUENCY: 2x/week  PT DURATION: 4 weeks  PLANNED INTERVENTIONS: 97110-Therapeutic exercises, 97530- Therapeutic activity, [id number]- Neuromuscular re-education, 97535- Self Care, 32440- Manual therapy, [id number]- Gait training, 97014- Electrical stimulation (unattended), 97016- Vasopneumatic device, Patient/Family education, Balance training, Stair training, Taping, Dry Needling, Joint mobilization, Cryotherapy, and Moist heat  PLAN FOR NEXT SESSION: focus on quad strength . MD 2 weeks. Renewal DONE, advised pt to contact surgeon in regards to patella positioning    Ollen Beverage, PTA 07/03/2023, 8:02 AM

## 2023-07-03 NOTE — Progress Notes (Signed)
    Procedures performed today:    None.  Independent interpretation of notes and tests performed by another provider:   None.  Brief History, Exam, Impression, and Recommendations:    BP (!) 156/88   Pulse 72   Ht 5\' 6"  (1.676 m)   Wt 181 lb 9.6 oz (82.4 kg)   SpO2 98%   BMI 29.31 kg/m   Decreased hearing of both ears Assessment & Plan: Patient has noted recent issues with decreased hearing bilaterally.  He denies any ear pain, ringing in the ears.  He does wonder about having formal evaluation regarding this.  We can proceed with referral to audiology for further assessment  Orders: -     Ambulatory referral to Audiology  Pain in both feet Assessment & Plan: Notes that he has had increased foot pain bilaterally.  This started recently after traveling to [place]  and doing more walking.  Since that time he has had notable increase in pain in the bottom of both feet.  He reports that it feels like "nerve pain".  He has not had any new numbness or tingling.  Pain will be worse with activity, sometimes at rest.  He has tried some icing.  Pain worse with walking, first of out of bed. On exam, patient has notable pes planus.  He does have tenderness to palpation near the calcaneus bilaterally and extending distally along the plantar surface.  No obvious bruising, no swelling. Based on history and exam, I do feel that specific nerve pain or neuropathy is less likely to be because of current acute increase in foot pain.  Feel that it is more likely to be related to plantar fasciitis or pain related to intrinsic muscle foot.  We discussed general conservative measures which can be utilized.  He does follow with podiatry and I do feel that having close follow-up with them would be warranted given his notable underlying chronic medical history.   Other orders -     Pen Needles; 1 each by Does not apply route daily.  Dispense: 100 each; Refill: 0  Return in about 4 months (around  11/03/2023) for diabetes, hypertension.   ___________________________________________ Timothy Duffner [name], MD, ABFM, CAQSM Primary Care and Sports Medicine [facility]

## 2023-07-04 ENCOUNTER — Telehealth (HOSPITAL_BASED_OUTPATIENT_CLINIC_OR_DEPARTMENT_OTHER): Payer: Self-pay | Admitting: *Deleted

## 2023-07-04 ENCOUNTER — Other Ambulatory Visit (HOSPITAL_BASED_OUTPATIENT_CLINIC_OR_DEPARTMENT_OTHER): Payer: Self-pay | Admitting: *Deleted

## 2023-07-04 DIAGNOSIS — D689 Coagulation defect, unspecified: Secondary | ICD-10-CM | POA: Diagnosis not present

## 2023-07-04 DIAGNOSIS — E1122 Type 2 diabetes mellitus with diabetic chronic kidney disease: Secondary | ICD-10-CM | POA: Diagnosis not present

## 2023-07-04 DIAGNOSIS — N2581 Secondary hyperparathyroidism of renal origin: Secondary | ICD-10-CM | POA: Diagnosis not present

## 2023-07-04 DIAGNOSIS — Z992 Dependence on renal dialysis: Secondary | ICD-10-CM | POA: Diagnosis not present

## 2023-07-04 DIAGNOSIS — N186 End stage renal disease: Secondary | ICD-10-CM | POA: Diagnosis not present

## 2023-07-04 MED ORDER — LANTUS SOLOSTAR 100 UNIT/ML ~~LOC~~ SOPN: 8.0000 [IU] | PEN_INJECTOR | Freq: Every day | SUBCUTANEOUS | 1 refills | Status: AC

## 2023-07-04 NOTE — Addendum Note (Signed)
 Addended by: [name], Gita Lamb J on: 07/04/2023 02:11 PM   Modules accepted: Orders

## 2023-07-04 NOTE — Telephone Encounter (Signed)
 Patient requesting refill on lantus . I attempted to refill this medication but it would not let me sign this.  Please refill medication for patient

## 2023-07-05 DIAGNOSIS — Z992 Dependence on renal dialysis: Secondary | ICD-10-CM | POA: Diagnosis not present

## 2023-07-05 DIAGNOSIS — N186 End stage renal disease: Secondary | ICD-10-CM | POA: Diagnosis not present

## 2023-07-05 DIAGNOSIS — E1122 Type 2 diabetes mellitus with diabetic chronic kidney disease: Secondary | ICD-10-CM | POA: Diagnosis not present

## 2023-07-07 DIAGNOSIS — Z992 Dependence on renal dialysis: Secondary | ICD-10-CM | POA: Diagnosis not present

## 2023-07-07 DIAGNOSIS — D631 Anemia in chronic kidney disease: Secondary | ICD-10-CM | POA: Diagnosis not present

## 2023-07-07 DIAGNOSIS — N186 End stage renal disease: Secondary | ICD-10-CM | POA: Diagnosis not present

## 2023-07-07 DIAGNOSIS — D689 Coagulation defect, unspecified: Secondary | ICD-10-CM | POA: Diagnosis not present

## 2023-07-07 DIAGNOSIS — N2581 Secondary hyperparathyroidism of renal origin: Secondary | ICD-10-CM | POA: Diagnosis not present

## 2023-07-07 DIAGNOSIS — D509 Iron deficiency anemia, unspecified: Secondary | ICD-10-CM | POA: Diagnosis not present

## 2023-07-09 DIAGNOSIS — D509 Iron deficiency anemia, unspecified: Secondary | ICD-10-CM | POA: Diagnosis not present

## 2023-07-09 DIAGNOSIS — Z992 Dependence on renal dialysis: Secondary | ICD-10-CM | POA: Diagnosis not present

## 2023-07-09 DIAGNOSIS — N186 End stage renal disease: Secondary | ICD-10-CM | POA: Diagnosis not present

## 2023-07-09 DIAGNOSIS — D689 Coagulation defect, unspecified: Secondary | ICD-10-CM | POA: Diagnosis not present

## 2023-07-09 DIAGNOSIS — D631 Anemia in chronic kidney disease: Secondary | ICD-10-CM | POA: Diagnosis not present

## 2023-07-09 DIAGNOSIS — N2581 Secondary hyperparathyroidism of renal origin: Secondary | ICD-10-CM | POA: Diagnosis not present

## 2023-07-10 ENCOUNTER — Ambulatory Visit: Admitting: Physical Therapy

## 2023-07-11 DIAGNOSIS — Z992 Dependence on renal dialysis: Secondary | ICD-10-CM | POA: Diagnosis not present

## 2023-07-11 DIAGNOSIS — N186 End stage renal disease: Secondary | ICD-10-CM | POA: Diagnosis not present

## 2023-07-11 DIAGNOSIS — D631 Anemia in chronic kidney disease: Secondary | ICD-10-CM | POA: Diagnosis not present

## 2023-07-11 DIAGNOSIS — D689 Coagulation defect, unspecified: Secondary | ICD-10-CM | POA: Diagnosis not present

## 2023-07-11 DIAGNOSIS — D509 Iron deficiency anemia, unspecified: Secondary | ICD-10-CM | POA: Diagnosis not present

## 2023-07-11 DIAGNOSIS — N2581 Secondary hyperparathyroidism of renal origin: Secondary | ICD-10-CM | POA: Diagnosis not present

## 2023-07-14 DIAGNOSIS — Z992 Dependence on renal dialysis: Secondary | ICD-10-CM | POA: Diagnosis not present

## 2023-07-14 DIAGNOSIS — N186 End stage renal disease: Secondary | ICD-10-CM | POA: Diagnosis not present

## 2023-07-14 DIAGNOSIS — D509 Iron deficiency anemia, unspecified: Secondary | ICD-10-CM | POA: Diagnosis not present

## 2023-07-14 DIAGNOSIS — N2581 Secondary hyperparathyroidism of renal origin: Secondary | ICD-10-CM | POA: Diagnosis not present

## 2023-07-14 DIAGNOSIS — D689 Coagulation defect, unspecified: Secondary | ICD-10-CM | POA: Diagnosis not present

## 2023-07-14 DIAGNOSIS — D631 Anemia in chronic kidney disease: Secondary | ICD-10-CM | POA: Diagnosis not present

## 2023-07-16 DIAGNOSIS — N2581 Secondary hyperparathyroidism of renal origin: Secondary | ICD-10-CM | POA: Diagnosis not present

## 2023-07-16 DIAGNOSIS — N186 End stage renal disease: Secondary | ICD-10-CM | POA: Diagnosis not present

## 2023-07-16 DIAGNOSIS — D689 Coagulation defect, unspecified: Secondary | ICD-10-CM | POA: Diagnosis not present

## 2023-07-16 DIAGNOSIS — Z992 Dependence on renal dialysis: Secondary | ICD-10-CM | POA: Diagnosis not present

## 2023-07-16 DIAGNOSIS — D631 Anemia in chronic kidney disease: Secondary | ICD-10-CM | POA: Diagnosis not present

## 2023-07-16 DIAGNOSIS — D509 Iron deficiency anemia, unspecified: Secondary | ICD-10-CM | POA: Diagnosis not present

## 2023-07-17 ENCOUNTER — Ambulatory Visit: Attending: Family Medicine | Admitting: Audiology

## 2023-07-17 DIAGNOSIS — H903 Sensorineural hearing loss, bilateral: Secondary | ICD-10-CM | POA: Diagnosis not present

## 2023-07-17 NOTE — Procedures (Signed)
  Outpatient Audiology and [facility] [address] [place], [place]  [address] [phone]  AUDIOLOGICAL  EVALUATION  NAME: Timothy [name]     DOB:   Dec 02, 1970      MRN: [id number]                                                                                     DATE: 07/17/2023     REFERENT: [name], [name], MD STATUS: Outpatient DIAGNOSIS: sensorineural hearing loss, bilateral    History: Ndrew was seen for an audiological evaluation due to concerns regarding his hearing sensitivity. Hanna reports decreased hearing sensitivity for 1+ year. He reports bilateral intermittent aural fullness. He reports intermittent tinnitus, worse when flying. He denies otalgia and dizziness.   Evaluation:  Otoscopy showed a clear view of the tympanic membranes, bilaterally Tympanometry results were consistent with normal middle ear function (Type A), bilaterally.  Audiometric testing was completed using Conventional Audiometry techniques with insert earphones and TDH headphones. Test results are consistent with a mild sensorineural hearing loss, bilaterally. Speech Recognition Thresholds were obtained at 30 dB HL in the right ear and at 35  dB HL in the left ear. Word Recognition Testing was completed at 70 dB HL and Jayveon scored 100%, bilaterally.    Results:  The test results were reviewed with Hewitt Lou. Today's test results are consistent with a mild sensorineural hearing loss, bilaterally. Jomar may have hearing and communication difficulty in adverse listening environments. He will benefit from the use of good communication strategies. [place] may only see minimal benefit from the use of hearing aids at this time. Audiological monitoring was recommended.  Recommendations: 1.   Monitor hearing sensitivity. Return in 2 years for an audiological evaluation    30 minutes spent testing and counseling on results.   If you have any questions please feel free to contact me at [phone]  [phone].  Bert Britain Audiologist, Au.D., CCC-A 07/17/2023  5:08 PM  Cc: [name], [name], MD

## 2023-07-18 DIAGNOSIS — D631 Anemia in chronic kidney disease: Secondary | ICD-10-CM | POA: Diagnosis not present

## 2023-07-18 DIAGNOSIS — Z992 Dependence on renal dialysis: Secondary | ICD-10-CM | POA: Diagnosis not present

## 2023-07-18 DIAGNOSIS — N2581 Secondary hyperparathyroidism of renal origin: Secondary | ICD-10-CM | POA: Diagnosis not present

## 2023-07-18 DIAGNOSIS — D689 Coagulation defect, unspecified: Secondary | ICD-10-CM | POA: Diagnosis not present

## 2023-07-18 DIAGNOSIS — D509 Iron deficiency anemia, unspecified: Secondary | ICD-10-CM | POA: Diagnosis not present

## 2023-07-18 DIAGNOSIS — N186 End stage renal disease: Secondary | ICD-10-CM | POA: Diagnosis not present

## 2023-07-21 DIAGNOSIS — N186 End stage renal disease: Secondary | ICD-10-CM | POA: Diagnosis not present

## 2023-07-21 DIAGNOSIS — N2581 Secondary hyperparathyroidism of renal origin: Secondary | ICD-10-CM | POA: Diagnosis not present

## 2023-07-21 DIAGNOSIS — D689 Coagulation defect, unspecified: Secondary | ICD-10-CM | POA: Diagnosis not present

## 2023-07-21 DIAGNOSIS — D509 Iron deficiency anemia, unspecified: Secondary | ICD-10-CM | POA: Diagnosis not present

## 2023-07-21 DIAGNOSIS — Z992 Dependence on renal dialysis: Secondary | ICD-10-CM | POA: Diagnosis not present

## 2023-07-21 DIAGNOSIS — D631 Anemia in chronic kidney disease: Secondary | ICD-10-CM | POA: Diagnosis not present

## 2023-07-25 DIAGNOSIS — Z992 Dependence on renal dialysis: Secondary | ICD-10-CM | POA: Diagnosis not present

## 2023-07-25 DIAGNOSIS — N2581 Secondary hyperparathyroidism of renal origin: Secondary | ICD-10-CM | POA: Diagnosis not present

## 2023-07-25 DIAGNOSIS — D631 Anemia in chronic kidney disease: Secondary | ICD-10-CM | POA: Diagnosis not present

## 2023-07-25 DIAGNOSIS — N186 End stage renal disease: Secondary | ICD-10-CM | POA: Diagnosis not present

## 2023-07-25 DIAGNOSIS — D509 Iron deficiency anemia, unspecified: Secondary | ICD-10-CM | POA: Diagnosis not present

## 2023-07-25 DIAGNOSIS — D689 Coagulation defect, unspecified: Secondary | ICD-10-CM | POA: Diagnosis not present

## 2023-07-28 DIAGNOSIS — N186 End stage renal disease: Secondary | ICD-10-CM | POA: Diagnosis not present

## 2023-07-28 DIAGNOSIS — D509 Iron deficiency anemia, unspecified: Secondary | ICD-10-CM | POA: Diagnosis not present

## 2023-07-28 DIAGNOSIS — N2581 Secondary hyperparathyroidism of renal origin: Secondary | ICD-10-CM | POA: Diagnosis not present

## 2023-07-28 DIAGNOSIS — D689 Coagulation defect, unspecified: Secondary | ICD-10-CM | POA: Diagnosis not present

## 2023-07-28 DIAGNOSIS — Z992 Dependence on renal dialysis: Secondary | ICD-10-CM | POA: Diagnosis not present

## 2023-07-28 DIAGNOSIS — D631 Anemia in chronic kidney disease: Secondary | ICD-10-CM | POA: Diagnosis not present

## 2023-07-30 ENCOUNTER — Ambulatory Visit (INDEPENDENT_AMBULATORY_CARE_PROVIDER_SITE_OTHER): Payer: 59 | Admitting: Podiatry

## 2023-07-30 ENCOUNTER — Encounter: Payer: Self-pay | Admitting: Podiatry

## 2023-07-30 ENCOUNTER — Ambulatory Visit (INDEPENDENT_AMBULATORY_CARE_PROVIDER_SITE_OTHER)

## 2023-07-30 DIAGNOSIS — M216X2 Other acquired deformities of left foot: Secondary | ICD-10-CM

## 2023-07-30 DIAGNOSIS — Q828 Other specified congenital malformations of skin: Secondary | ICD-10-CM

## 2023-07-30 DIAGNOSIS — D631 Anemia in chronic kidney disease: Secondary | ICD-10-CM | POA: Diagnosis not present

## 2023-07-30 DIAGNOSIS — Z794 Long term (current) use of insulin: Secondary | ICD-10-CM

## 2023-07-30 DIAGNOSIS — N186 End stage renal disease: Secondary | ICD-10-CM

## 2023-07-30 DIAGNOSIS — M79674 Pain in right toe(s): Secondary | ICD-10-CM | POA: Diagnosis not present

## 2023-07-30 DIAGNOSIS — E11621 Type 2 diabetes mellitus with foot ulcer: Secondary | ICD-10-CM | POA: Diagnosis not present

## 2023-07-30 DIAGNOSIS — B351 Tinea unguium: Secondary | ICD-10-CM | POA: Diagnosis not present

## 2023-07-30 DIAGNOSIS — M79675 Pain in left toe(s): Secondary | ICD-10-CM

## 2023-07-30 DIAGNOSIS — Z992 Dependence on renal dialysis: Secondary | ICD-10-CM | POA: Diagnosis not present

## 2023-07-30 DIAGNOSIS — L84 Corns and callosities: Secondary | ICD-10-CM | POA: Diagnosis not present

## 2023-07-30 DIAGNOSIS — L97529 Non-pressure chronic ulcer of other part of left foot with unspecified severity: Secondary | ICD-10-CM | POA: Diagnosis not present

## 2023-07-30 DIAGNOSIS — D689 Coagulation defect, unspecified: Secondary | ICD-10-CM | POA: Diagnosis not present

## 2023-07-30 DIAGNOSIS — N2581 Secondary hyperparathyroidism of renal origin: Secondary | ICD-10-CM | POA: Diagnosis not present

## 2023-07-30 DIAGNOSIS — D509 Iron deficiency anemia, unspecified: Secondary | ICD-10-CM | POA: Diagnosis not present

## 2023-07-30 DIAGNOSIS — E1122 Type 2 diabetes mellitus with diabetic chronic kidney disease: Secondary | ICD-10-CM

## 2023-07-30 MED ORDER — GENTAMICIN SULFATE 0.1 % EX CREA
TOPICAL_CREAM | CUTANEOUS | 1 refills | Status: AC
Start: 2023-07-30 — End: ?

## 2023-07-31 NOTE — Progress Notes (Signed)
 Subjective: Patient presents today at risk foot care with h/o NIDDM with ESRD on hemodialysis and corn(s) dorsal PIPJ of L 4th toe, callus(es) of both feet and painful elongated, discolored, dystrophic nails.  Pain interferes with ambulation. Aggravating factors include wearing enclosed shoe gear. Painful toenails interfere with ambulation. Aggravating factors include wearing enclosed shoe gear. Pain is relieved with periodic professional debridement. Painful corns and calluses are aggravated when weightbearing with and without shoegear. Pain is relieved with periodic professional debridement.  New concern today: Location: left great toe Patient states he pulled a piece of skin from his left great toe resulting in wound below. He has been cleaning with hydrogen peroxide and alcohol  daily. Denies any drainage, odor, swelling or redness. Denies any fever, chills, night sweats, nausea or vomiting.  He also has neuropathy pain described as burning, tingling of both feet. He is also requesting diabetic shoes. States he was measured, but did not receive them.   [name], Quintin PARAS, MD is patient's PCP. Last visit was 07/03/2023.  Current Outpatient Medications on File Prior to Visit  Medication Sig Dispense Refill   amLODipine  (NORVASC ) 10 MG tablet Take 10 mg by mouth daily.     atorvastatin  (LIPITOR) 40 MG tablet Take 1 tablet (40 mg total) by mouth daily. 90 tablet 1   Continuous Glucose Sensor (DEXCOM G7 SENSOR) MISC 1 Device by Does not apply route continuous. 9 each 1   Continuous Glucose Sensor (FREESTYLE LIBRE 3 SENSOR) MISC 1 each by Does not apply route See admin instructions. Replace every 14 days 6 each 1   furosemide  (LASIX ) 80 MG tablet Take 80 mg by mouth every Tuesday, Thursday, Saturday, and Sunday.     hydrALAZINE  (APRESOLINE ) 100 MG tablet Take 50 mg by mouth 3 (three) times daily. MAY TAKE EXTRA 50MG  FOR SBP>160     insulin  glargine (LANTUS  SOLOSTAR) 100 UNIT/ML Solostar Pen Inject  8 Units into the skin at bedtime. 15 mL 1   Insulin  Pen Needle (PEN NEEDLES) 31G X 5 MM MISC 1 each by Does not apply route daily. 100 each 0   isosorbide  mononitrate (IMDUR ) 60 MG 24 hr tablet Take 60 mg by mouth daily.     labetalol  (NORMODYNE ) 200 MG tablet Take 400 mg by mouth 2 (two) times daily.     oxyCODONE -acetaminophen  (PERCOCET/ROXICET) 5-325 MG tablet Take 1 tablet by mouth every 4 (four) hours as needed for severe pain (pain score 7-10). 10 tablet 0   pantoprazole  (PROTONIX ) 40 MG tablet Take 1 tablet (40 mg total) by mouth 2 (two) times daily before a meal. 180 tablet 3   torsemide (DEMADEX) 20 MG tablet Take 20 mg by mouth 2 (two) times daily.     No current facility-administered medications on file prior to visit.     No Known Allergies   Objective: There were no vitals filed for this visit.  Ubaldo Camellia Louder II is a pleasant 53 y.o. male WD, WN in NAD. AAO X 3.  Vascular Examination: Capillary refill time <3 seconds b/l LE. Palpable pedal pulses b/l LE. Digital hair present b/l. No pedal edema b/l. Skin temperature gradient WNL b/l. No varicosities b/l. No ischemia or gangrene noted b/l LE. No cyanosis or clubbing noted b/l LE.SABRA  Dermatological Examination: Pedal skin with normal turgor, texture and tone b/l. No open wounds. No interdigital macerations b/l. Toenails 1-5 b/l thickened, discolored, dystrophic with subungual debris. There is pain on palpation to dorsal aspect of nailplates.   Porokeratotic lesion(s)  submet head 3 left foot, submet head 5 right foot. No erythema, no edema, no drainage, no fluctuance. Preulcerative lesion noted dorsal PIPJ of L 3rd toe. There is visible subdermal hemorrhage. There is no surrounding erythema, no edema, no drainage, no odor, no fluctuance.     Wound Location: plantarmedial IPJ of left hallux There is a minimal amount of devitalized tissue present in the wound. Wound Measurement: 1.0 x 0.3 x 0.1 cm. Wound Base:  Granular/Healthy Peri-wound: Calloused Exudate: None: wound tissue dry Blood Loss during debridement: 0 cc('s). Sign(s) of clinical bacterial infection: no clinical signs of infection noted on examination today.   Musculoskeletal Examination: Muscle strength 5/5 to all lower extremity muscle groups bilaterally. Plantarflexed metatarsal(s) 3rd metatarsal head left foot and 5th metatarsal head right foot. Hallux valgus with bunion deformity noted of both feet. Hammertoe(s) 2-5 b/l.  Neurological Examination: Pt has subjective symptoms of neuropathy. Protective sensation intact 5/5 intact bilaterally with 10g monofilament b/l.  Xray findings left foot: No gas in tissues left foot. Vessel calcifications noted left foot. Laterally deviated hallux with medial deviation of 1st metatarsal left foot. Contracted digits 2-5 left foot. No bone erosion noted at location of ulceration left great toe.  Assessment and Plan 1. Pain due to onychomycosis of toenails of both feet   2. 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 Porokeratosis   4. Pre-ulcerative corn or callous   5. 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  Dispense: 30 g; Refill: 1 Patient instructed to cleanse left great toe with Dial  antibacterial soap. Dab dry and apply Gentamicin  Cream to digit once daily.  -Patient was evaluated and treated and all questions answered.  -Patient/POA/Family member educated on diagnosis and treatment plan of routine ulcer debridement/wound care.  -Xrays of left great toe taken in office and reviewed with patient. -Ulceration debridement achieved utilizing sharp excisional debridement to level of subcutaneous tissue with sterile scalpel blade and sterile currette..  Type/amount of devitalized tissue removed: hyperkeratotic tissue -Today's ulcer size post-debridement: 1.0 x 0.3 x 0.1 cm. -Ulceration cleansed with wound cleanser. Triple antibiotic ointment applied to base of ulceration and secured with light dressing. -Wound responded well to today's debridement. -Toenails 1-5 b/l were debrided in length and girth with sterile nail nippers and dremel without iatrogenic bleeding.  -Preulcerative lesion pared dorsal PIPJ of L 3rd toe utilizing sterile scalpel blade. Total number pared=1. -Porokeratotic lesion(s) submet head 3 left foot and sub 5th met base right foot pared and enucleated with sterile currette without incident. Total number of lesions debrided=2. -Patient scheduled to see Dr. Silva in 1-2 weeks for follow up of diabetic ulcer left great toe. Patient also to discuss neuropathy pain with Dr. Silva. -Patient was measured for diabetic shoes in November and has not received them. Informed him we will have to restart procedure since it's been 7 months. Will write new Rx after diabetic ulcer heals. Patient related understanding. -Patient/POA to call should there be question/concern in the interim.  Return in about 3 months (around 10/30/2023) for at risk foot care with me.  Delon LITTIE Merlin, DPM      [facility] LOCATION: [address]  [place], [place] [address]                   Office [phone]   [facility] LOCATION: [address] [place], [place] [address] Office [phone]

## 2023-08-01 DIAGNOSIS — D509 Iron deficiency anemia, unspecified: Secondary | ICD-10-CM | POA: Diagnosis not present

## 2023-08-01 DIAGNOSIS — Z992 Dependence on renal dialysis: Secondary | ICD-10-CM | POA: Diagnosis not present

## 2023-08-01 DIAGNOSIS — N2581 Secondary hyperparathyroidism of renal origin: Secondary | ICD-10-CM | POA: Diagnosis not present

## 2023-08-01 DIAGNOSIS — N186 End stage renal disease: Secondary | ICD-10-CM | POA: Diagnosis not present

## 2023-08-01 DIAGNOSIS — D689 Coagulation defect, unspecified: Secondary | ICD-10-CM | POA: Diagnosis not present

## 2023-08-01 DIAGNOSIS — D631 Anemia in chronic kidney disease: Secondary | ICD-10-CM | POA: Diagnosis not present

## 2023-08-04 DIAGNOSIS — D689 Coagulation defect, unspecified: Secondary | ICD-10-CM | POA: Diagnosis not present

## 2023-08-04 DIAGNOSIS — D631 Anemia in chronic kidney disease: Secondary | ICD-10-CM | POA: Diagnosis not present

## 2023-08-04 DIAGNOSIS — Z992 Dependence on renal dialysis: Secondary | ICD-10-CM | POA: Diagnosis not present

## 2023-08-04 DIAGNOSIS — N2581 Secondary hyperparathyroidism of renal origin: Secondary | ICD-10-CM | POA: Diagnosis not present

## 2023-08-04 DIAGNOSIS — E1122 Type 2 diabetes mellitus with diabetic chronic kidney disease: Secondary | ICD-10-CM | POA: Diagnosis not present

## 2023-08-04 DIAGNOSIS — N186 End stage renal disease: Secondary | ICD-10-CM | POA: Diagnosis not present

## 2023-08-04 DIAGNOSIS — D509 Iron deficiency anemia, unspecified: Secondary | ICD-10-CM | POA: Diagnosis not present

## 2023-08-05 ENCOUNTER — Other Ambulatory Visit (HOSPITAL_COMMUNITY): Payer: Self-pay

## 2023-08-06 DIAGNOSIS — Z992 Dependence on renal dialysis: Secondary | ICD-10-CM | POA: Diagnosis not present

## 2023-08-06 DIAGNOSIS — D689 Coagulation defect, unspecified: Secondary | ICD-10-CM | POA: Diagnosis not present

## 2023-08-06 DIAGNOSIS — E1122 Type 2 diabetes mellitus with diabetic chronic kidney disease: Secondary | ICD-10-CM | POA: Diagnosis not present

## 2023-08-06 DIAGNOSIS — D509 Iron deficiency anemia, unspecified: Secondary | ICD-10-CM | POA: Diagnosis not present

## 2023-08-06 DIAGNOSIS — N186 End stage renal disease: Secondary | ICD-10-CM | POA: Diagnosis not present

## 2023-08-06 DIAGNOSIS — N2581 Secondary hyperparathyroidism of renal origin: Secondary | ICD-10-CM | POA: Diagnosis not present

## 2023-08-08 DIAGNOSIS — N186 End stage renal disease: Secondary | ICD-10-CM | POA: Diagnosis not present

## 2023-08-08 DIAGNOSIS — Z992 Dependence on renal dialysis: Secondary | ICD-10-CM | POA: Diagnosis not present

## 2023-08-11 DIAGNOSIS — E1122 Type 2 diabetes mellitus with diabetic chronic kidney disease: Secondary | ICD-10-CM | POA: Diagnosis not present

## 2023-08-11 DIAGNOSIS — N2581 Secondary hyperparathyroidism of renal origin: Secondary | ICD-10-CM | POA: Diagnosis not present

## 2023-08-11 DIAGNOSIS — D509 Iron deficiency anemia, unspecified: Secondary | ICD-10-CM | POA: Diagnosis not present

## 2023-08-11 DIAGNOSIS — Z992 Dependence on renal dialysis: Secondary | ICD-10-CM | POA: Diagnosis not present

## 2023-08-11 DIAGNOSIS — D689 Coagulation defect, unspecified: Secondary | ICD-10-CM | POA: Diagnosis not present

## 2023-08-11 DIAGNOSIS — N186 End stage renal disease: Secondary | ICD-10-CM | POA: Diagnosis not present

## 2023-08-13 DIAGNOSIS — D509 Iron deficiency anemia, unspecified: Secondary | ICD-10-CM | POA: Diagnosis not present

## 2023-08-13 DIAGNOSIS — E1122 Type 2 diabetes mellitus with diabetic chronic kidney disease: Secondary | ICD-10-CM | POA: Diagnosis not present

## 2023-08-13 DIAGNOSIS — N186 End stage renal disease: Secondary | ICD-10-CM | POA: Diagnosis not present

## 2023-08-13 DIAGNOSIS — Z992 Dependence on renal dialysis: Secondary | ICD-10-CM | POA: Diagnosis not present

## 2023-08-13 DIAGNOSIS — N2581 Secondary hyperparathyroidism of renal origin: Secondary | ICD-10-CM | POA: Diagnosis not present

## 2023-08-13 DIAGNOSIS — D689 Coagulation defect, unspecified: Secondary | ICD-10-CM | POA: Diagnosis not present

## 2023-08-15 DIAGNOSIS — D509 Iron deficiency anemia, unspecified: Secondary | ICD-10-CM | POA: Diagnosis not present

## 2023-08-15 DIAGNOSIS — Z992 Dependence on renal dialysis: Secondary | ICD-10-CM | POA: Diagnosis not present

## 2023-08-15 DIAGNOSIS — N2581 Secondary hyperparathyroidism of renal origin: Secondary | ICD-10-CM | POA: Diagnosis not present

## 2023-08-15 DIAGNOSIS — D689 Coagulation defect, unspecified: Secondary | ICD-10-CM | POA: Diagnosis not present

## 2023-08-15 DIAGNOSIS — N186 End stage renal disease: Secondary | ICD-10-CM | POA: Diagnosis not present

## 2023-08-15 DIAGNOSIS — E1122 Type 2 diabetes mellitus with diabetic chronic kidney disease: Secondary | ICD-10-CM | POA: Diagnosis not present

## 2023-08-18 DIAGNOSIS — Z992 Dependence on renal dialysis: Secondary | ICD-10-CM | POA: Diagnosis not present

## 2023-08-18 DIAGNOSIS — N2581 Secondary hyperparathyroidism of renal origin: Secondary | ICD-10-CM | POA: Diagnosis not present

## 2023-08-18 DIAGNOSIS — D689 Coagulation defect, unspecified: Secondary | ICD-10-CM | POA: Diagnosis not present

## 2023-08-18 DIAGNOSIS — D509 Iron deficiency anemia, unspecified: Secondary | ICD-10-CM | POA: Diagnosis not present

## 2023-08-18 DIAGNOSIS — E1122 Type 2 diabetes mellitus with diabetic chronic kidney disease: Secondary | ICD-10-CM | POA: Diagnosis not present

## 2023-08-18 DIAGNOSIS — N186 End stage renal disease: Secondary | ICD-10-CM | POA: Diagnosis not present

## 2023-08-19 ENCOUNTER — Ambulatory Visit: Admitting: Podiatry

## 2023-08-20 DIAGNOSIS — N186 End stage renal disease: Secondary | ICD-10-CM | POA: Diagnosis not present

## 2023-08-20 DIAGNOSIS — Z992 Dependence on renal dialysis: Secondary | ICD-10-CM | POA: Diagnosis not present

## 2023-08-20 DIAGNOSIS — N2581 Secondary hyperparathyroidism of renal origin: Secondary | ICD-10-CM | POA: Diagnosis not present

## 2023-08-20 DIAGNOSIS — E1122 Type 2 diabetes mellitus with diabetic chronic kidney disease: Secondary | ICD-10-CM | POA: Diagnosis not present

## 2023-08-20 DIAGNOSIS — D509 Iron deficiency anemia, unspecified: Secondary | ICD-10-CM | POA: Diagnosis not present

## 2023-08-20 DIAGNOSIS — D689 Coagulation defect, unspecified: Secondary | ICD-10-CM | POA: Diagnosis not present

## 2023-08-25 ENCOUNTER — Telehealth (HOSPITAL_BASED_OUTPATIENT_CLINIC_OR_DEPARTMENT_OTHER): Payer: Self-pay | Admitting: *Deleted

## 2023-08-25 DIAGNOSIS — E1122 Type 2 diabetes mellitus with diabetic chronic kidney disease: Secondary | ICD-10-CM | POA: Diagnosis not present

## 2023-08-25 DIAGNOSIS — Z992 Dependence on renal dialysis: Secondary | ICD-10-CM | POA: Diagnosis not present

## 2023-08-25 DIAGNOSIS — D689 Coagulation defect, unspecified: Secondary | ICD-10-CM | POA: Diagnosis not present

## 2023-08-25 DIAGNOSIS — N2581 Secondary hyperparathyroidism of renal origin: Secondary | ICD-10-CM | POA: Diagnosis not present

## 2023-08-25 DIAGNOSIS — D509 Iron deficiency anemia, unspecified: Secondary | ICD-10-CM | POA: Diagnosis not present

## 2023-08-25 DIAGNOSIS — N186 End stage renal disease: Secondary | ICD-10-CM | POA: Diagnosis not present
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
## 2023-08-27 DIAGNOSIS — E1122 Type 2 diabetes mellitus with diabetic chronic kidney disease: Secondary | ICD-10-CM | POA: Diagnosis not present

## 2023-08-27 DIAGNOSIS — Z992 Dependence on renal dialysis: Secondary | ICD-10-CM | POA: Diagnosis not present

## 2023-08-27 DIAGNOSIS — D689 Coagulation defect, unspecified: Secondary | ICD-10-CM | POA: Diagnosis not present

## 2023-08-27 DIAGNOSIS — D509 Iron deficiency anemia, unspecified: Secondary | ICD-10-CM | POA: Diagnosis not present

## 2023-08-27 DIAGNOSIS — N186 End stage renal disease: Secondary | ICD-10-CM | POA: Diagnosis not present

## 2023-08-27 DIAGNOSIS — N2581 Secondary hyperparathyroidism of renal origin: Secondary | ICD-10-CM | POA: Diagnosis not present

## 2023-08-29 DIAGNOSIS — D509 Iron deficiency anemia, unspecified: Secondary | ICD-10-CM | POA: Diagnosis not present

## 2023-08-29 DIAGNOSIS — E1122 Type 2 diabetes mellitus with diabetic chronic kidney disease: Secondary | ICD-10-CM | POA: Diagnosis not present

## 2023-08-29 DIAGNOSIS — N186 End stage renal disease: Secondary | ICD-10-CM | POA: Diagnosis not present

## 2023-08-29 DIAGNOSIS — N2581 Secondary hyperparathyroidism of renal origin: Secondary | ICD-10-CM | POA: Diagnosis not present

## 2023-08-29 DIAGNOSIS — D689 Coagulation defect, unspecified: Secondary | ICD-10-CM | POA: Diagnosis not present

## 2023-08-29 DIAGNOSIS — Z992 Dependence on renal dialysis: Secondary | ICD-10-CM | POA: Diagnosis not present

## 2023-09-01 DIAGNOSIS — E1122 Type 2 diabetes mellitus with diabetic chronic kidney disease: Secondary | ICD-10-CM | POA: Diagnosis not present

## 2023-09-01 DIAGNOSIS — D509 Iron deficiency anemia, unspecified: Secondary | ICD-10-CM | POA: Diagnosis not present

## 2023-09-01 DIAGNOSIS — Z992 Dependence on renal dialysis: Secondary | ICD-10-CM | POA: Diagnosis not present

## 2023-09-01 DIAGNOSIS — N186 End stage renal disease: Secondary | ICD-10-CM | POA: Diagnosis not present

## 2023-09-01 DIAGNOSIS — N2581 Secondary hyperparathyroidism of renal origin: Secondary | ICD-10-CM | POA: Diagnosis not present

## 2023-09-01 DIAGNOSIS — D689 Coagulation defect, unspecified: Secondary | ICD-10-CM | POA: Diagnosis not present

## 2023-09-03 DIAGNOSIS — D509 Iron deficiency anemia, unspecified: Secondary | ICD-10-CM | POA: Diagnosis not present

## 2023-09-03 DIAGNOSIS — D689 Coagulation defect, unspecified: Secondary | ICD-10-CM | POA: Diagnosis not present

## 2023-09-03 DIAGNOSIS — N186 End stage renal disease: Secondary | ICD-10-CM | POA: Diagnosis not present

## 2023-09-03 DIAGNOSIS — Z992 Dependence on renal dialysis: Secondary | ICD-10-CM | POA: Diagnosis not present

## 2023-09-03 DIAGNOSIS — N2581 Secondary hyperparathyroidism of renal origin: Secondary | ICD-10-CM | POA: Diagnosis not present

## 2023-09-03 DIAGNOSIS — E1122 Type 2 diabetes mellitus with diabetic chronic kidney disease: Secondary | ICD-10-CM | POA: Diagnosis not present

## 2023-09-04 DIAGNOSIS — N186 End stage renal disease: Secondary | ICD-10-CM | POA: Diagnosis not present

## 2023-09-04 DIAGNOSIS — Z992 Dependence on renal dialysis: Secondary | ICD-10-CM | POA: Diagnosis not present

## 2023-09-04 DIAGNOSIS — E1122 Type 2 diabetes mellitus with diabetic chronic kidney disease: Secondary | ICD-10-CM | POA: Diagnosis not present

## 2023-09-12 ENCOUNTER — Telehealth: Payer: Self-pay

## 2023-09-12 NOTE — Telephone Encounter (Signed)
 Refaxed ppw 09/12/23

## 2023-09-21 ENCOUNTER — Ambulatory Visit: Payer: Self-pay | Admitting: Pulmonary Disease

## 2023-09-21 DIAGNOSIS — R042 Hemoptysis: Secondary | ICD-10-CM

## 2023-09-23 NOTE — Telephone Encounter (Signed)
 Spoke to patient and advised him of need for CT and ROV.  CT ordered. Appt scheduled 11/06/23. Nothing further needed.

## 2023-09-30 ENCOUNTER — Telehealth: Payer: Self-pay

## 2023-09-30 NOTE — Telephone Encounter (Signed)
 Ppwk rcvd faxed to [facility]

## 2023-10-02 ENCOUNTER — Other Ambulatory Visit

## 2023-10-06 DIAGNOSIS — E1122 Type 2 diabetes mellitus with diabetic chronic kidney disease: Secondary | ICD-10-CM | POA: Diagnosis not present

## 2023-10-06 DIAGNOSIS — N186 End stage renal disease: Secondary | ICD-10-CM | POA: Diagnosis not present

## 2023-10-06 DIAGNOSIS — T82898A Other specified complication of vascular prosthetic devices, implants and grafts, initial encounter: Secondary | ICD-10-CM | POA: Diagnosis not present

## 2023-10-06 DIAGNOSIS — Z992 Dependence on renal dialysis: Secondary | ICD-10-CM | POA: Diagnosis not present

## 2023-10-06 DIAGNOSIS — D631 Anemia in chronic kidney disease: Secondary | ICD-10-CM | POA: Diagnosis not present

## 2023-10-08 DIAGNOSIS — N186 End stage renal disease: Secondary | ICD-10-CM | POA: Diagnosis not present

## 2023-10-08 DIAGNOSIS — E1122 Type 2 diabetes mellitus with diabetic chronic kidney disease: Secondary | ICD-10-CM | POA: Diagnosis not present

## 2023-10-08 DIAGNOSIS — T82898A Other specified complication of vascular prosthetic devices, implants and grafts, initial encounter: Secondary | ICD-10-CM | POA: Diagnosis not present

## 2023-10-08 DIAGNOSIS — D631 Anemia in chronic kidney disease: Secondary | ICD-10-CM | POA: Diagnosis not present

## 2023-10-08 DIAGNOSIS — Z992 Dependence on renal dialysis: Secondary | ICD-10-CM | POA: Diagnosis not present

## 2023-10-13 DIAGNOSIS — Z992 Dependence on renal dialysis: Secondary | ICD-10-CM | POA: Diagnosis not present

## 2023-10-13 DIAGNOSIS — T82898A Other specified complication of vascular prosthetic devices, implants and grafts, initial encounter: Secondary | ICD-10-CM | POA: Diagnosis not present

## 2023-10-13 DIAGNOSIS — N186 End stage renal disease: Secondary | ICD-10-CM | POA: Diagnosis not present

## 2023-10-13 DIAGNOSIS — D631 Anemia in chronic kidney disease: Secondary | ICD-10-CM | POA: Diagnosis not present

## 2023-10-13 DIAGNOSIS — E1122 Type 2 diabetes mellitus with diabetic chronic kidney disease: Secondary | ICD-10-CM | POA: Diagnosis not present

## 2023-10-15 ENCOUNTER — Ambulatory Visit (HOSPITAL_COMMUNITY)

## 2023-10-15 DIAGNOSIS — Z992 Dependence on renal dialysis: Secondary | ICD-10-CM | POA: Diagnosis not present

## 2023-10-15 DIAGNOSIS — D631 Anemia in chronic kidney disease: Secondary | ICD-10-CM | POA: Diagnosis not present

## 2023-10-15 DIAGNOSIS — N186 End stage renal disease: Secondary | ICD-10-CM | POA: Diagnosis not present

## 2023-10-15 DIAGNOSIS — T82858A Stenosis of vascular prosthetic devices, implants and grafts, initial encounter: Secondary | ICD-10-CM | POA: Diagnosis not present

## 2023-10-15 DIAGNOSIS — I871 Compression of vein: Secondary | ICD-10-CM | POA: Diagnosis not present

## 2023-10-15 DIAGNOSIS — T82898A Other specified complication of vascular prosthetic devices, implants and grafts, initial encounter: Secondary | ICD-10-CM | POA: Diagnosis not present

## 2023-10-15 DIAGNOSIS — T82510A Breakdown (mechanical) of surgically created arteriovenous fistula, initial encounter: Secondary | ICD-10-CM | POA: Diagnosis not present

## 2023-10-15 DIAGNOSIS — E1122 Type 2 diabetes mellitus with diabetic chronic kidney disease: Secondary | ICD-10-CM | POA: Diagnosis not present

## 2023-10-16 DIAGNOSIS — N186 End stage renal disease: Secondary | ICD-10-CM | POA: Diagnosis not present

## 2023-10-16 DIAGNOSIS — Z992 Dependence on renal dialysis: Secondary | ICD-10-CM | POA: Diagnosis not present

## 2023-10-16 DIAGNOSIS — D631 Anemia in chronic kidney disease: Secondary | ICD-10-CM | POA: Diagnosis not present

## 2023-10-16 DIAGNOSIS — E1122 Type 2 diabetes mellitus with diabetic chronic kidney disease: Secondary | ICD-10-CM | POA: Diagnosis not present

## 2023-10-16 DIAGNOSIS — T82898A Other specified complication of vascular prosthetic devices, implants and grafts, initial encounter: Secondary | ICD-10-CM | POA: Diagnosis not present

## 2023-10-17 DIAGNOSIS — Z992 Dependence on renal dialysis: Secondary | ICD-10-CM | POA: Diagnosis not present

## 2023-10-17 DIAGNOSIS — D631 Anemia in chronic kidney disease: Secondary | ICD-10-CM | POA: Diagnosis not present

## 2023-10-17 DIAGNOSIS — N186 End stage renal disease: Secondary | ICD-10-CM | POA: Diagnosis not present

## 2023-10-17 DIAGNOSIS — T82898A Other specified complication of vascular prosthetic devices, implants and grafts, initial encounter: Secondary | ICD-10-CM | POA: Diagnosis not present

## 2023-10-17 DIAGNOSIS — E1122 Type 2 diabetes mellitus with diabetic chronic kidney disease: Secondary | ICD-10-CM | POA: Diagnosis not present

## 2023-10-20 DIAGNOSIS — E1122 Type 2 diabetes mellitus with diabetic chronic kidney disease: Secondary | ICD-10-CM | POA: Diagnosis not present

## 2023-10-20 DIAGNOSIS — T82898A Other specified complication of vascular prosthetic devices, implants and grafts, initial encounter: Secondary | ICD-10-CM | POA: Diagnosis not present

## 2023-10-20 DIAGNOSIS — D631 Anemia in chronic kidney disease: Secondary | ICD-10-CM | POA: Diagnosis not present

## 2023-10-20 DIAGNOSIS — N186 End stage renal disease: Secondary | ICD-10-CM | POA: Diagnosis not present

## 2023-10-20 DIAGNOSIS — Z992 Dependence on renal dialysis: Secondary | ICD-10-CM | POA: Diagnosis not present

## 2023-10-21 ENCOUNTER — Ambulatory Visit (HOSPITAL_COMMUNITY)

## 2023-10-22 ENCOUNTER — Ambulatory Visit (HOSPITAL_COMMUNITY)

## 2023-10-22 DIAGNOSIS — E1122 Type 2 diabetes mellitus with diabetic chronic kidney disease: Secondary | ICD-10-CM | POA: Diagnosis not present

## 2023-10-22 DIAGNOSIS — T82898A Other specified complication of vascular prosthetic devices, implants and grafts, initial encounter: Secondary | ICD-10-CM | POA: Diagnosis not present

## 2023-10-22 DIAGNOSIS — D631 Anemia in chronic kidney disease: Secondary | ICD-10-CM | POA: Diagnosis not present

## 2023-10-22 DIAGNOSIS — N186 End stage renal disease: Secondary | ICD-10-CM | POA: Diagnosis not present

## 2023-10-22 DIAGNOSIS — Z992 Dependence on renal dialysis: Secondary | ICD-10-CM | POA: Diagnosis not present

## 2023-10-24 DIAGNOSIS — E1122 Type 2 diabetes mellitus with diabetic chronic kidney disease: Secondary | ICD-10-CM | POA: Diagnosis not present

## 2023-10-24 DIAGNOSIS — Z992 Dependence on renal dialysis: Secondary | ICD-10-CM | POA: Diagnosis not present

## 2023-10-24 DIAGNOSIS — D631 Anemia in chronic kidney disease: Secondary | ICD-10-CM | POA: Diagnosis not present

## 2023-10-24 DIAGNOSIS — N186 End stage renal disease: Secondary | ICD-10-CM | POA: Diagnosis not present

## 2023-10-24 DIAGNOSIS — T82898A Other specified complication of vascular prosthetic devices, implants and grafts, initial encounter: Secondary | ICD-10-CM | POA: Diagnosis not present

## 2023-10-27 DIAGNOSIS — T82898A Other specified complication of vascular prosthetic devices, implants and grafts, initial encounter: Secondary | ICD-10-CM | POA: Diagnosis not present

## 2023-10-27 DIAGNOSIS — Z992 Dependence on renal dialysis: Secondary | ICD-10-CM | POA: Diagnosis not present

## 2023-10-27 DIAGNOSIS — D631 Anemia in chronic kidney disease: Secondary | ICD-10-CM | POA: Diagnosis not present

## 2023-10-27 DIAGNOSIS — E1122 Type 2 diabetes mellitus with diabetic chronic kidney disease: Secondary | ICD-10-CM | POA: Diagnosis not present

## 2023-10-27 DIAGNOSIS — N186 End stage renal disease: Secondary | ICD-10-CM | POA: Diagnosis not present

## 2023-10-28 ENCOUNTER — Ambulatory Visit (HOSPITAL_COMMUNITY): Admission: RE | Admit: 2023-10-28 | Source: Ambulatory Visit

## 2023-10-29 DIAGNOSIS — E1122 Type 2 diabetes mellitus with diabetic chronic kidney disease: Secondary | ICD-10-CM | POA: Diagnosis not present

## 2023-10-29 DIAGNOSIS — D631 Anemia in chronic kidney disease: Secondary | ICD-10-CM | POA: Diagnosis not present

## 2023-10-29 DIAGNOSIS — T82898A Other specified complication of vascular prosthetic devices, implants and grafts, initial encounter: Secondary | ICD-10-CM | POA: Diagnosis not present

## 2023-10-29 DIAGNOSIS — N186 End stage renal disease: Secondary | ICD-10-CM | POA: Diagnosis not present

## 2023-10-29 DIAGNOSIS — Z992 Dependence on renal dialysis: Secondary | ICD-10-CM | POA: Diagnosis not present

## 2023-10-31 DIAGNOSIS — N186 End stage renal disease: Secondary | ICD-10-CM | POA: Diagnosis not present

## 2023-10-31 DIAGNOSIS — E1122 Type 2 diabetes mellitus with diabetic chronic kidney disease: Secondary | ICD-10-CM | POA: Diagnosis not present

## 2023-10-31 DIAGNOSIS — T82898A Other specified complication of vascular prosthetic devices, implants and grafts, initial encounter: Secondary | ICD-10-CM | POA: Diagnosis not present

## 2023-10-31 DIAGNOSIS — Z992 Dependence on renal dialysis: Secondary | ICD-10-CM | POA: Diagnosis not present

## 2023-10-31 DIAGNOSIS — D631 Anemia in chronic kidney disease: Secondary | ICD-10-CM | POA: Diagnosis not present

## 2023-11-03 DIAGNOSIS — D631 Anemia in chronic kidney disease: Secondary | ICD-10-CM | POA: Diagnosis not present

## 2023-11-03 DIAGNOSIS — E1122 Type 2 diabetes mellitus with diabetic chronic kidney disease: Secondary | ICD-10-CM | POA: Diagnosis not present

## 2023-11-03 DIAGNOSIS — T82898A Other specified complication of vascular prosthetic devices, implants and grafts, initial encounter: Secondary | ICD-10-CM | POA: Diagnosis not present

## 2023-11-03 DIAGNOSIS — N186 End stage renal disease: Secondary | ICD-10-CM | POA: Diagnosis not present

## 2023-11-03 DIAGNOSIS — Z992 Dependence on renal dialysis: Secondary | ICD-10-CM | POA: Diagnosis not present

## 2023-11-04 ENCOUNTER — Other Ambulatory Visit

## 2023-11-04 ENCOUNTER — Ambulatory Visit (HOSPITAL_BASED_OUTPATIENT_CLINIC_OR_DEPARTMENT_OTHER): Admitting: Family Medicine

## 2023-11-04 DIAGNOSIS — N186 End stage renal disease: Secondary | ICD-10-CM | POA: Diagnosis not present

## 2023-11-04 DIAGNOSIS — Z992 Dependence on renal dialysis: Secondary | ICD-10-CM | POA: Diagnosis not present

## 2023-11-06 ENCOUNTER — Ambulatory Visit: Admitting: Pulmonary Disease

## 2023-11-06 ENCOUNTER — Encounter: Payer: Self-pay | Admitting: Pulmonary Disease

## 2023-11-18 ENCOUNTER — Ambulatory Visit: Admitting: Podiatry

## 2023-11-18 ENCOUNTER — Ambulatory Visit (HOSPITAL_BASED_OUTPATIENT_CLINIC_OR_DEPARTMENT_OTHER): Admitting: Family Medicine

## 2023-11-18 DIAGNOSIS — Z91199 Patient's noncompliance with other medical treatment and regimen due to unspecified reason: Secondary | ICD-10-CM

## 2023-11-18 NOTE — Progress Notes (Signed)
 1. No-show for appointment

## 2023-12-08 ENCOUNTER — Encounter: Payer: Self-pay | Admitting: Radiology

## 2023-12-10 ENCOUNTER — Ambulatory Visit (HOSPITAL_BASED_OUTPATIENT_CLINIC_OR_DEPARTMENT_OTHER): Admitting: Family Medicine

## 2023-12-19 IMAGING — DX DG FINGER MIDDLE 2+V*R*
3 series · 3 of 3 positions shown · non-contrast
Comparison: None Available.

CLINICAL DATA: Right middle finger soft tissue infection and pain.

EXAM:
RIGHT MIDDLE FINGER 2+V

[finger ap]
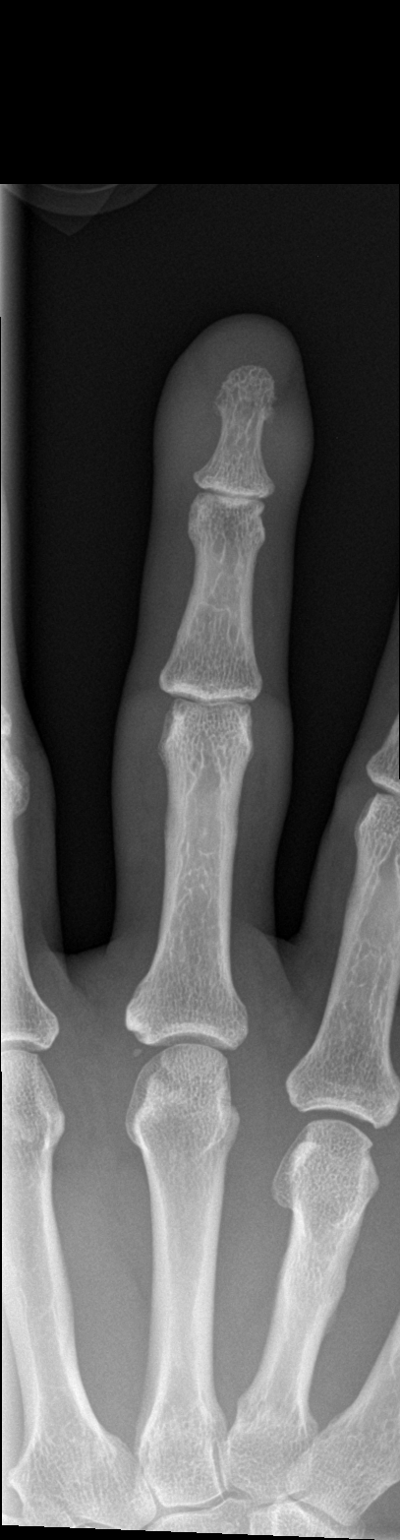

[finger obl]
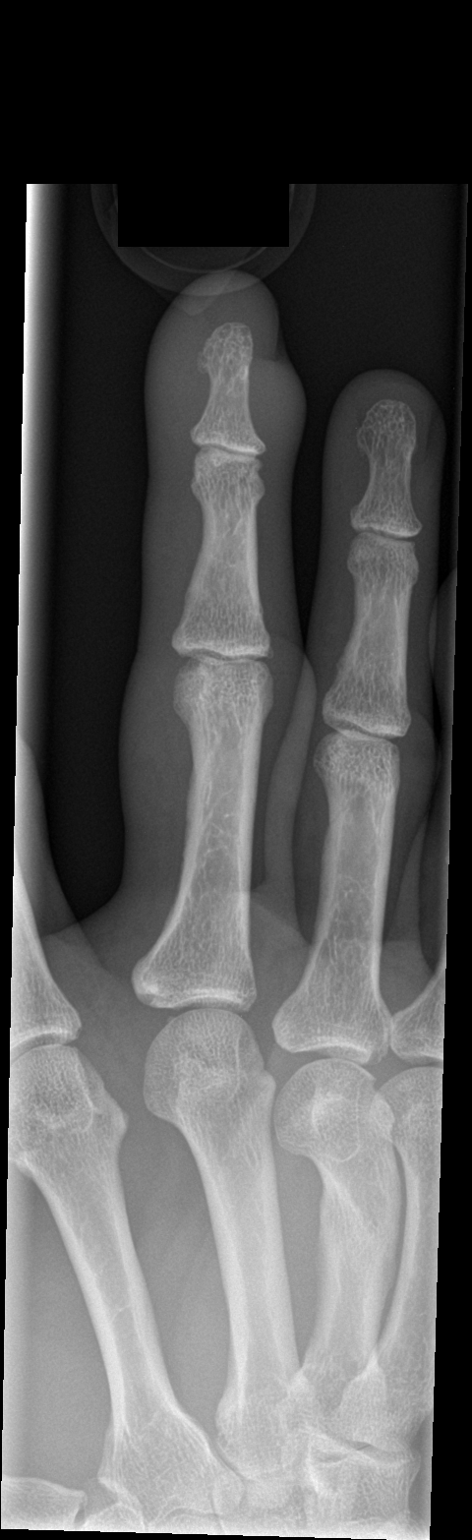

[finger lat]
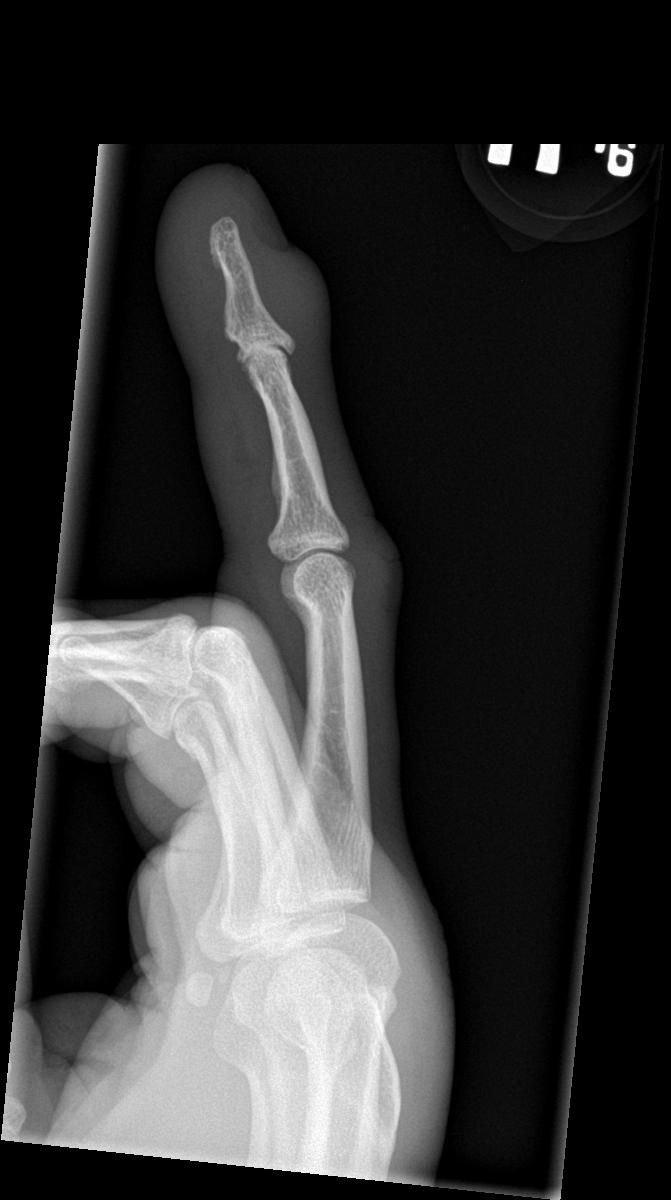

[3 of 3 positions shown; findings below may reference images not displayed]

FINDINGS: Soft-tissue swelling seen overlying the distal phalanx. No evidence
of radiopaque foreign body or soft tissue gas. There is no evidence
of fracture or dislocation. There is no evidence of osteomyelitis.
Mild degenerative spurring is seen involving the DIP joint.
IMPRESSION: Distal soft tissue swelling. No radiographic evidence of
osteomyelitis or fracture.

Mild DIP joint osteoarthritis.

## 2023-12-19 IMAGING — DX DG CHEST 2V
2 series · 2 of 2 positions shown · non-contrast
Comparison: 11/14/2020

CLINICAL DATA: Chest pain radiating to neck.  Shortness of breath.

EXAM:
CHEST - 2 VIEW

[chest lat]
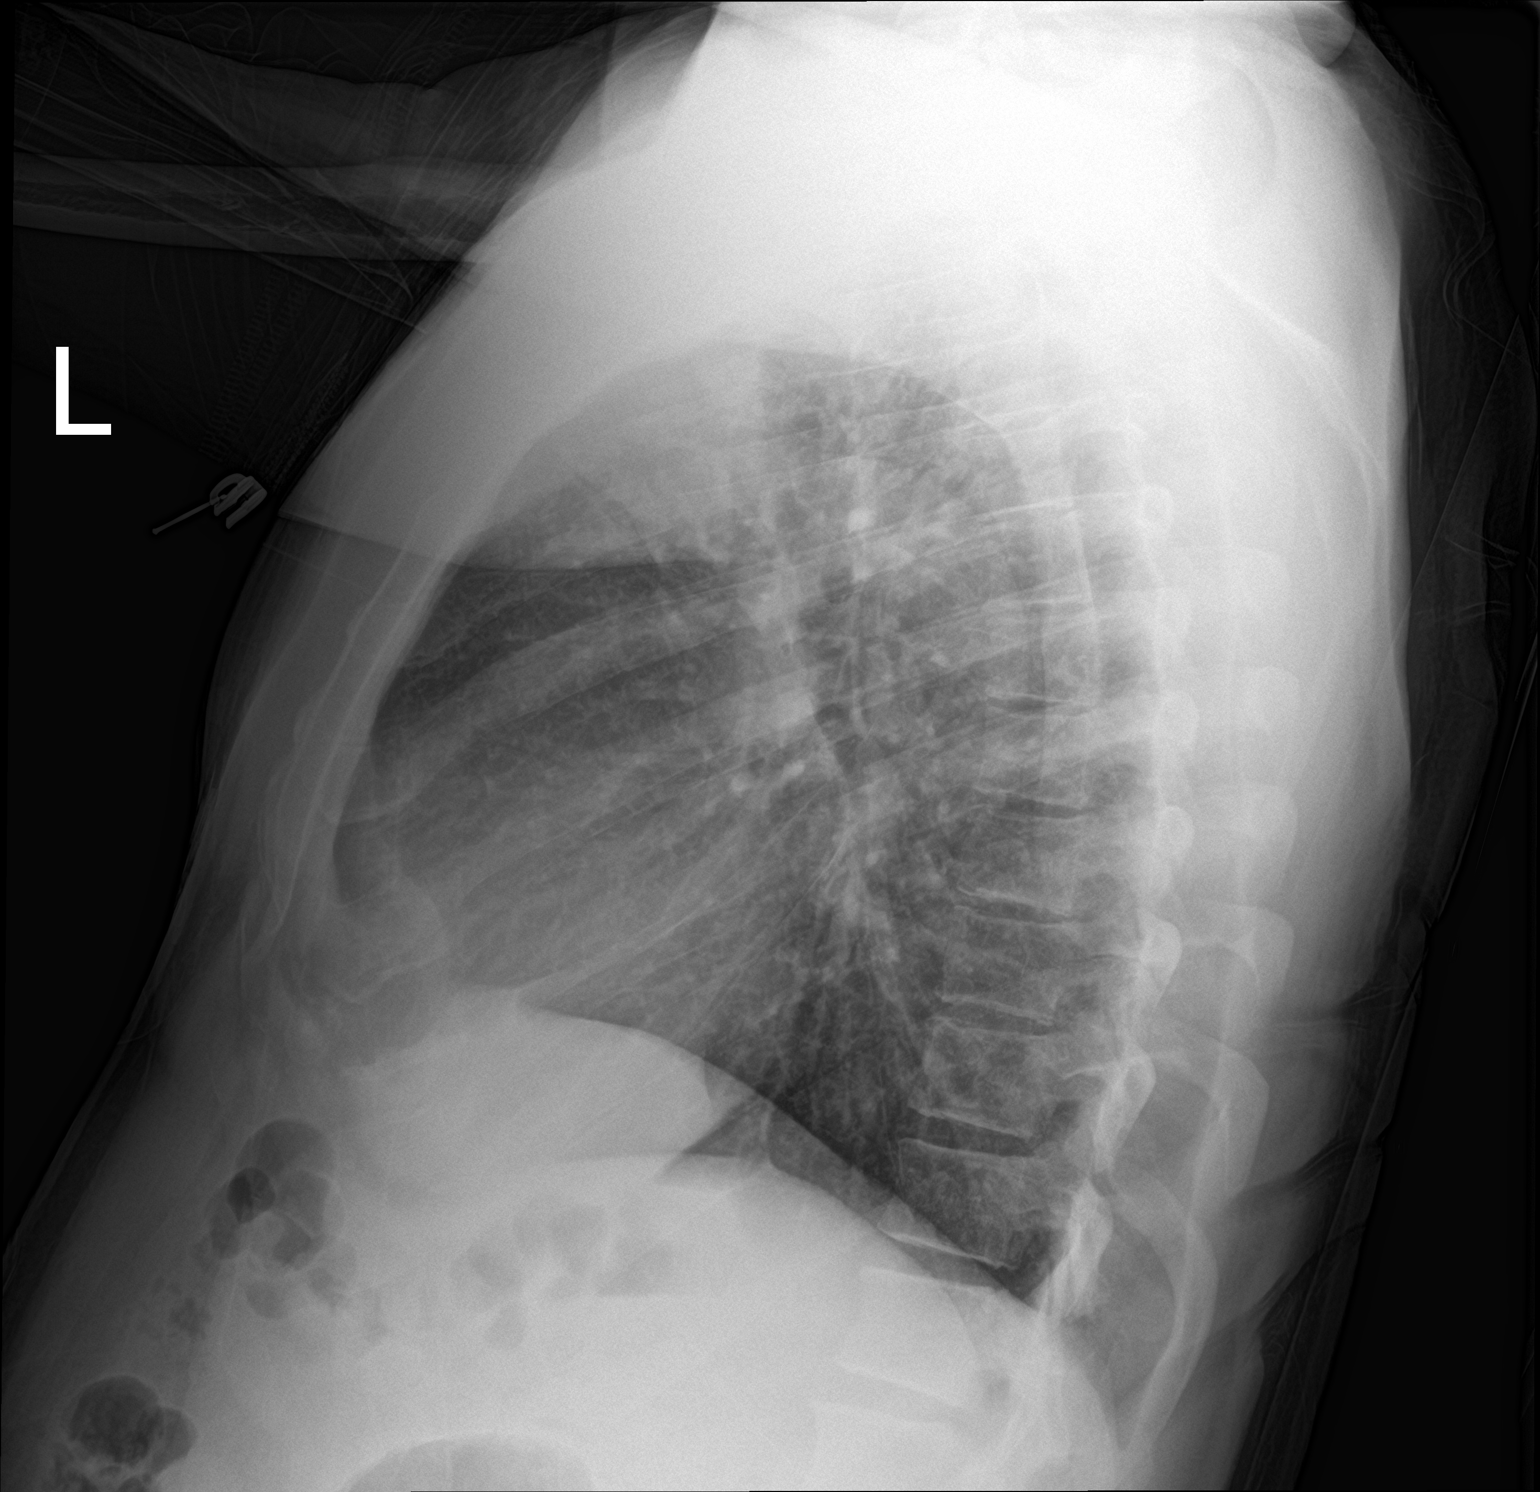

[chest ap]
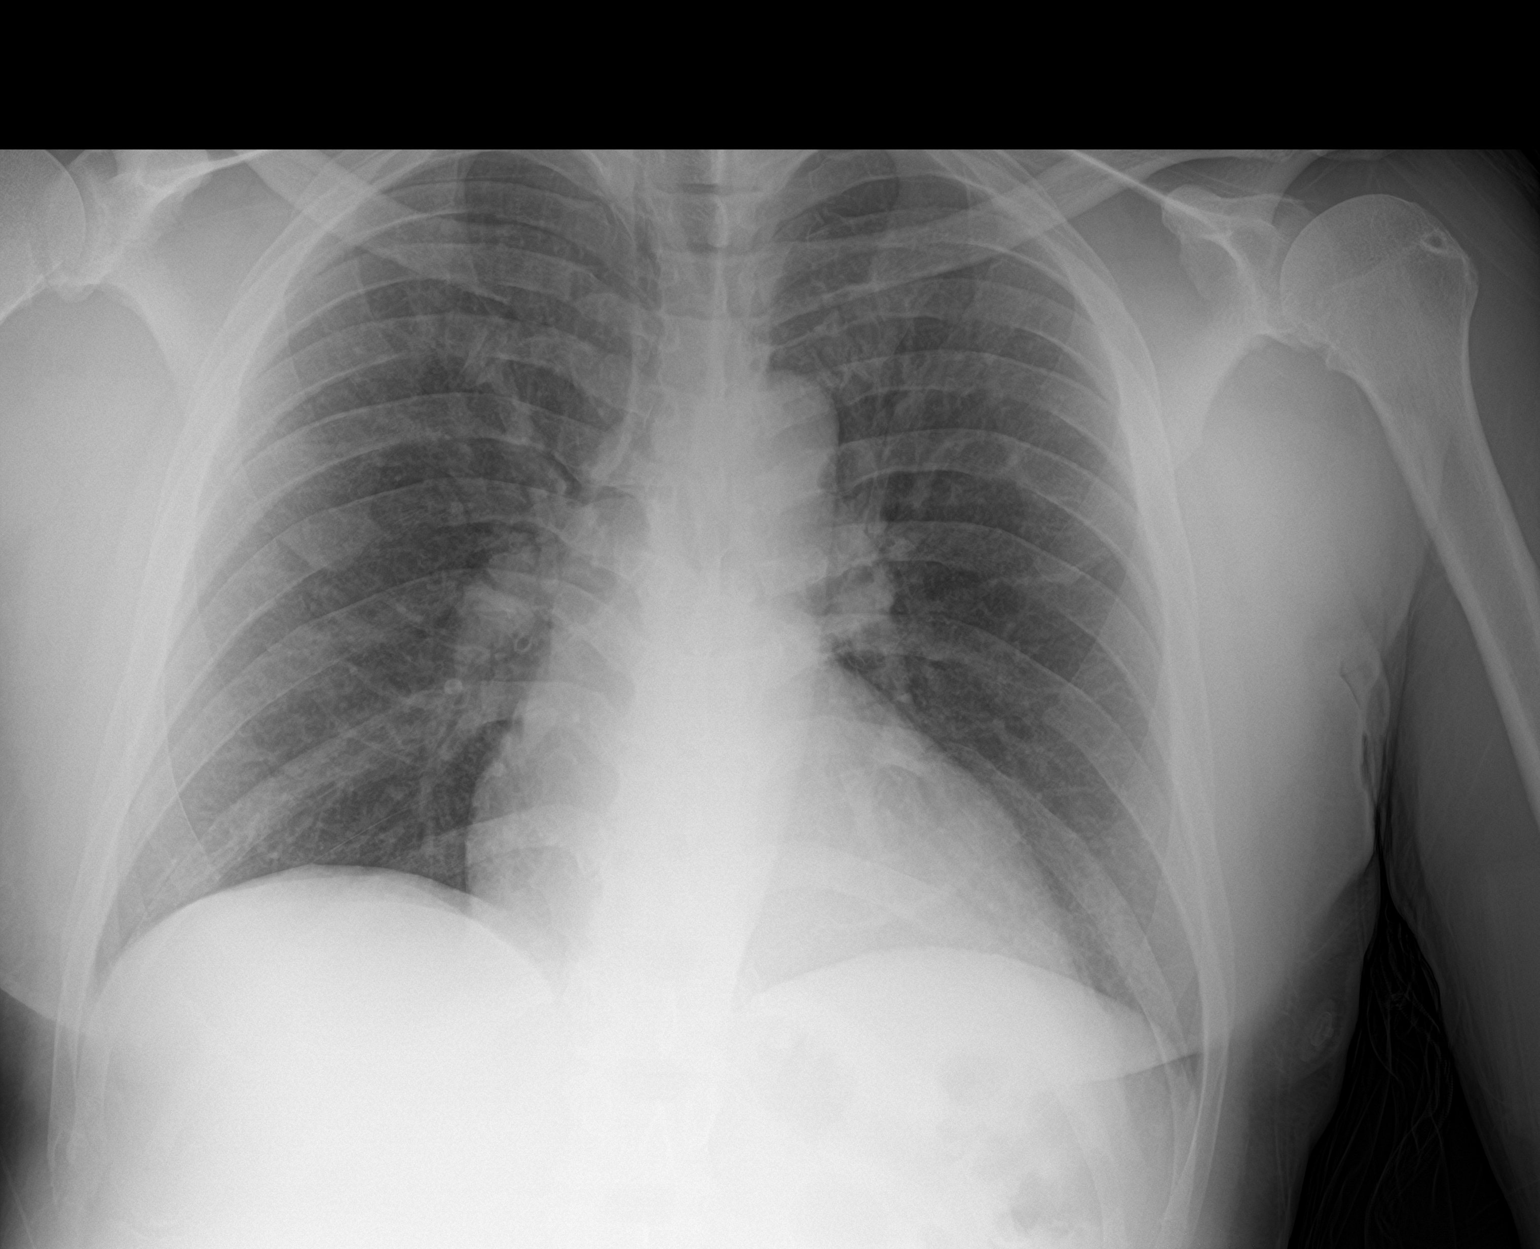

[2 of 2 positions shown; findings below may reference images not displayed]

FINDINGS: The heart size and mediastinal contours are within normal limits.
Both lungs are clear. The visualized skeletal structures are
unremarkable.
IMPRESSION: No active cardiopulmonary disease.

## 2023-12-23 ENCOUNTER — Encounter (HOSPITAL_BASED_OUTPATIENT_CLINIC_OR_DEPARTMENT_OTHER): Payer: 59

## 2023-12-29 ENCOUNTER — Telehealth (HOSPITAL_BASED_OUTPATIENT_CLINIC_OR_DEPARTMENT_OTHER): Payer: Self-pay | Admitting: Family Medicine

## 2023-12-29 NOTE — Telephone Encounter (Signed)
 Copied from CRM [id number]. Topic: General - Other >> Dec 29, 2023 12:16 PM Everette C wrote: Reason for CRM: Elyn with [employer] has called to introduce herself as the patient's nurse case production designer, theatre/television/film with [employer]  Please contact Krystle at [phone] ext [phone] if needed further

## 2024-02-26 NOTE — Progress Notes (Signed)
 Timothy [name]                                          MRN: [id number]   02/26/2024   The [facility] Quality Team Specialist reviewed this patient medical record for the purposes of chart review for care gap closure. The following were reviewed: abstraction for care gap closure-glycemic status assessment.    [facility] Quality Team
